# Patient Record
Sex: Male | Born: 1955 | Race: Black or African American | Hispanic: No | Marital: Married | Smoking: Never smoker
Health system: Southern US, Community
[De-identification: ages and names within clinical notes are randomized; demographics above are authoritative.]

## PROBLEM LIST (undated history)

## (undated) DIAGNOSIS — K219 Gastro-esophageal reflux disease without esophagitis: Secondary | ICD-10-CM

## (undated) DIAGNOSIS — E119 Type 2 diabetes mellitus without complications: Secondary | ICD-10-CM

## (undated) DIAGNOSIS — J181 Lobar pneumonia, unspecified organism: Secondary | ICD-10-CM

## (undated) DIAGNOSIS — K769 Liver disease, unspecified: Secondary | ICD-10-CM

## (undated) DIAGNOSIS — E785 Hyperlipidemia, unspecified: Secondary | ICD-10-CM

## (undated) DIAGNOSIS — I10 Essential (primary) hypertension: Secondary | ICD-10-CM

## (undated) DIAGNOSIS — Z89511 Acquired absence of right leg below knee: Secondary | ICD-10-CM

## (undated) HISTORY — DX: Lobar pneumonia, unspecified organism: J18.1

## (undated) HISTORY — DX: Gastro-esophageal reflux disease without esophagitis: K21.9

## (undated) HISTORY — DX: Acquired absence of right leg below knee: Z89.511

## (undated) HISTORY — DX: Essential (primary) hypertension: I10

## (undated) HISTORY — PX: WISDOM TOOTH EXTRACTION: SHX21

## (undated) HISTORY — DX: Hyperlipidemia, unspecified: E78.5

## (undated) HISTORY — PX: CHOLECYSTECTOMY: SHX55

## (undated) HISTORY — PX: BELOW KNEE LEG AMPUTATION: SUR23

## (undated) HISTORY — DX: Liver disease, unspecified: K76.9

---

## 2000-05-30 ENCOUNTER — Encounter: Payer: Self-pay | Admitting: *Deleted

## 2000-05-30 ENCOUNTER — Ambulatory Visit (HOSPITAL_COMMUNITY): Admission: RE | Admit: 2000-05-30 | Discharge: 2000-05-30 | Payer: Self-pay | Admitting: *Deleted

## 2000-06-10 ENCOUNTER — Encounter (HOSPITAL_BASED_OUTPATIENT_CLINIC_OR_DEPARTMENT_OTHER): Payer: Self-pay | Admitting: General Surgery

## 2000-06-14 ENCOUNTER — Encounter (HOSPITAL_BASED_OUTPATIENT_CLINIC_OR_DEPARTMENT_OTHER): Payer: Self-pay | Admitting: General Surgery

## 2000-06-15 ENCOUNTER — Inpatient Hospital Stay (HOSPITAL_COMMUNITY): Admission: RE | Admit: 2000-06-15 | Discharge: 2000-06-17 | Payer: Self-pay | Admitting: General Surgery

## 2000-06-23 ENCOUNTER — Ambulatory Visit (HOSPITAL_COMMUNITY): Admission: RE | Admit: 2000-06-23 | Discharge: 2000-06-23 | Payer: Self-pay | Admitting: General Surgery

## 2000-06-23 ENCOUNTER — Encounter (HOSPITAL_BASED_OUTPATIENT_CLINIC_OR_DEPARTMENT_OTHER): Payer: Self-pay | Admitting: General Surgery

## 2000-06-29 ENCOUNTER — Ambulatory Visit (HOSPITAL_COMMUNITY): Admission: RE | Admit: 2000-06-29 | Discharge: 2000-06-29 | Payer: Self-pay | Admitting: Gastroenterology

## 2000-06-29 ENCOUNTER — Encounter: Payer: Self-pay | Admitting: Gastroenterology

## 2000-06-30 ENCOUNTER — Inpatient Hospital Stay (HOSPITAL_COMMUNITY): Admission: EM | Admit: 2000-06-30 | Discharge: 2000-07-10 | Payer: Self-pay | Admitting: Emergency Medicine

## 2000-06-30 ENCOUNTER — Encounter: Payer: Self-pay | Admitting: Emergency Medicine

## 2000-07-01 ENCOUNTER — Encounter: Payer: Self-pay | Admitting: Internal Medicine

## 2000-07-06 ENCOUNTER — Encounter: Payer: Self-pay | Admitting: Internal Medicine

## 2001-07-07 ENCOUNTER — Ambulatory Visit (HOSPITAL_COMMUNITY): Admission: RE | Admit: 2001-07-07 | Discharge: 2001-07-07 | Payer: Self-pay | Admitting: Gastroenterology

## 2001-07-07 ENCOUNTER — Encounter: Payer: Self-pay | Admitting: Gastroenterology

## 2001-08-18 ENCOUNTER — Inpatient Hospital Stay (HOSPITAL_COMMUNITY): Admission: EM | Admit: 2001-08-18 | Discharge: 2001-08-19 | Payer: Self-pay | Admitting: Emergency Medicine

## 2001-08-18 ENCOUNTER — Encounter: Payer: Self-pay | Admitting: Gastroenterology

## 2001-10-09 ENCOUNTER — Encounter: Payer: Self-pay | Admitting: Gastroenterology

## 2001-10-09 ENCOUNTER — Ambulatory Visit (HOSPITAL_COMMUNITY): Admission: RE | Admit: 2001-10-09 | Discharge: 2001-10-09 | Payer: Self-pay | Admitting: Gastroenterology

## 2003-01-09 ENCOUNTER — Ambulatory Visit (HOSPITAL_COMMUNITY): Admission: RE | Admit: 2003-01-09 | Discharge: 2003-01-09 | Payer: Self-pay | Admitting: Gastroenterology

## 2003-01-09 ENCOUNTER — Encounter: Payer: Self-pay | Admitting: Gastroenterology

## 2003-06-11 ENCOUNTER — Inpatient Hospital Stay (HOSPITAL_COMMUNITY): Admission: EM | Admit: 2003-06-11 | Discharge: 2003-06-14 | Payer: Self-pay | Admitting: Emergency Medicine

## 2003-06-11 ENCOUNTER — Ambulatory Visit (HOSPITAL_COMMUNITY): Admission: RE | Admit: 2003-06-11 | Discharge: 2003-06-11 | Payer: Self-pay | Admitting: Gastroenterology

## 2003-08-09 ENCOUNTER — Ambulatory Visit (HOSPITAL_COMMUNITY): Admission: RE | Admit: 2003-08-09 | Discharge: 2003-08-09 | Payer: Self-pay | Admitting: *Deleted

## 2004-06-11 ENCOUNTER — Ambulatory Visit: Payer: Self-pay | Admitting: Gastroenterology

## 2004-09-29 ENCOUNTER — Inpatient Hospital Stay (HOSPITAL_COMMUNITY): Admission: EM | Admit: 2004-09-29 | Discharge: 2004-10-01 | Payer: Self-pay | Admitting: Gastroenterology

## 2004-09-29 ENCOUNTER — Ambulatory Visit: Payer: Self-pay | Admitting: Gastroenterology

## 2004-10-27 ENCOUNTER — Ambulatory Visit: Payer: Self-pay | Admitting: Gastroenterology

## 2005-04-20 ENCOUNTER — Ambulatory Visit: Payer: Self-pay | Admitting: Gastroenterology

## 2005-06-09 ENCOUNTER — Ambulatory Visit: Payer: Self-pay | Admitting: Gastroenterology

## 2005-06-10 ENCOUNTER — Ambulatory Visit: Payer: Self-pay | Admitting: Gastroenterology

## 2005-06-10 ENCOUNTER — Ambulatory Visit (HOSPITAL_COMMUNITY): Admission: RE | Admit: 2005-06-10 | Discharge: 2005-06-10 | Payer: Self-pay | Admitting: Gastroenterology

## 2005-07-02 ENCOUNTER — Ambulatory Visit: Payer: Self-pay | Admitting: Gastroenterology

## 2005-12-13 ENCOUNTER — Ambulatory Visit: Payer: Self-pay | Admitting: Gastroenterology

## 2005-12-13 ENCOUNTER — Encounter: Admission: RE | Admit: 2005-12-13 | Discharge: 2005-12-13 | Payer: Self-pay | Admitting: Cardiology

## 2005-12-14 ENCOUNTER — Ambulatory Visit (HOSPITAL_COMMUNITY): Admission: RE | Admit: 2005-12-14 | Discharge: 2005-12-14 | Payer: Self-pay | Admitting: Gastroenterology

## 2005-12-17 ENCOUNTER — Ambulatory Visit: Payer: Self-pay | Admitting: Gastroenterology

## 2005-12-23 ENCOUNTER — Ambulatory Visit: Payer: Self-pay | Admitting: Gastroenterology

## 2005-12-23 ENCOUNTER — Ambulatory Visit (HOSPITAL_COMMUNITY): Admission: RE | Admit: 2005-12-23 | Discharge: 2005-12-23 | Payer: Self-pay | Admitting: Gastroenterology

## 2006-07-20 ENCOUNTER — Ambulatory Visit: Payer: Self-pay | Admitting: Gastroenterology

## 2006-07-20 LAB — CONVERTED CEMR LAB
ALT: 23 units/L (ref 0–40)
AST: 22 units/L (ref 0–37)
Albumin: 3.8 g/dL (ref 3.5–5.2)
Alkaline Phosphatase: 65 units/L (ref 39–117)
Basophils Absolute: 0 10*3/uL (ref 0.0–0.1)
Basophils Relative: 0.4 % (ref 0.0–1.0)
Bilirubin, Direct: 0.2 mg/dL (ref 0.0–0.3)
Eosinophils Absolute: 0.2 10*3/uL (ref 0.0–0.6)
Eosinophils Relative: 2.7 % (ref 0.0–5.0)
HCT: 42.2 % (ref 39.0–52.0)
Hemoglobin: 14.6 g/dL (ref 13.0–17.0)
Lymphocytes Relative: 30.5 % (ref 12.0–46.0)
MCHC: 34.7 g/dL (ref 30.0–36.0)
MCV: 92.5 fL (ref 78.0–100.0)
Monocytes Absolute: 0.5 10*3/uL (ref 0.2–0.7)
Monocytes Relative: 7 % (ref 3.0–11.0)
Neutro Abs: 3.9 10*3/uL (ref 1.4–7.7)
Neutrophils Relative %: 59.4 % (ref 43.0–77.0)
Platelets: 230 10*3/uL (ref 150–400)
RBC: 4.56 M/uL (ref 4.22–5.81)
RDW: 12.5 % (ref 11.5–14.6)
Total Bilirubin: 1.2 mg/dL (ref 0.3–1.2)
Total Protein: 7.7 g/dL (ref 6.0–8.3)
WBC: 6.6 10*3/uL (ref 4.5–10.5)

## 2007-01-09 ENCOUNTER — Ambulatory Visit: Payer: Self-pay | Admitting: Gastroenterology

## 2007-01-20 ENCOUNTER — Ambulatory Visit: Payer: Self-pay | Admitting: Gastroenterology

## 2007-01-20 ENCOUNTER — Encounter: Payer: Self-pay | Admitting: Gastroenterology

## 2007-06-06 ENCOUNTER — Ambulatory Visit: Payer: Self-pay | Admitting: Gastroenterology

## 2007-06-06 LAB — CONVERTED CEMR LAB
ALT: 16 units/L (ref 0–53)
AST: 23 units/L (ref 0–37)
Albumin: 3.9 g/dL (ref 3.5–5.2)
Alkaline Phosphatase: 57 units/L (ref 39–117)
Bilirubin, Direct: 0.1 mg/dL (ref 0.0–0.3)
Total Bilirubin: 1 mg/dL (ref 0.3–1.2)
Total Protein: 7.4 g/dL (ref 6.0–8.3)

## 2010-10-09 NOTE — Discharge Summary (Signed)
NAME:  [name], [name]                         ACCOUNT NO.:  [id number]   MEDICAL RECORD NO.:  [id number]                   PATIENT TYPE:  INP   LOCATION:  0368                                 FACILITY:  [facility]   PHYSICIAN:  Barbette Hair. Arlyce Dice, M.D. [facility]          DATE OF BIRTH:  [birth year]   DATE OF ADMISSION:  06/11/2003  DATE OF DISCHARGE:  06/14/2003                                 DISCHARGE SUMMARY   ADMITTING DIAGNOSES:  79. A 55 year old male with acute abdominal pain and nausea, status post     ERCP, biliary sphincterotomy, stone extraction and stent exchange earlier     on same day, rule out pancreatitis post ERCP, rule out cholangitis, rule     out possible perforation.  2. 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  3. Status post multiple surgeries on the right lower extremity for     deformity.  4. Hypertension.   DISCHARGE DIAGNOSES:  1. Acute cholangitis, status post ERCP, resolved.  74. 4.  A 55 year old male with acute abdominal pain and nausea, status post     ERCP, biliary sphincterotomy, stone extraction and stent exchange earlier     on same day, rule out pancreatitis post ERCP, rule out cholangitis, rule     out possible perforation.  3. 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  4. Status post multiple surgeries on the right lower extremity for     deformity.  5. Hypertension.   CONSULTATIONS:  None.   PROCEDURES:  CT scan of the abdomen and pelvis.   BRIEF HISTORY:  [name] is a pleasant 55 year old African American male known  to Dr. Arlyce Dice and the GI service.  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  He had  undergone ERCP  with biliary sphincterotomy, stone extraction, and dilation of a biliary  stricture and then re-stenting of the bile duct per Dr. Arlyce Dice, earlier on  the day of admission.  He developed acute right upper quadrant pain and  nausea which then progressed late in the evening of the day of the  procedure.  He was called in Darvocet and Phenergan, advised to stay on  clear liquids but had progressive symptoms and then vomiting and presented  to the emergency room.  He has had two prior episodes of post ERCP  pancreatitis.  He was seen and evaluated by Dr. Leone Payor in the emergency  room who was covering on call and admitted to the hospital for pain control  and further diagnostic evaluation to rule out cholangitis and/or perforation  complication post procedure.  Admission amylase and lipase were normal,  therefore, not  consistent with a post ERCP pancreatitis.   CURRENT MEDICATIONS:  1. Diovan one p.o. every day.  2. Nexium 40 mg p.o. every day.   ALLERGIES:  No known drug allergies.   PAST MEDICAL HISTORY:  As outlined above.   SOCIAL HISTORY:  The patient is married.  He is a nondrinker, nonsmoker.  He  is employed as a Advice worker in the emergency room.  He has one son.   FAMILY HISTORY:  Negative for GI disease.   PHYSICAL EXAMINATION:  GENERAL: Per Dr. Leone Payor:  A well developed African  American male, quiet and in pain.  VITAL SIGNS: Temp 98.7, blood pressure 146/82, pulse is 96, respirations 18,  temp was 100.8 on recheck.  HEENT:  Nontraumatic, normocephalic.  EOMI.  PERRLA.  Sclera were anicteric,  on admission.  CV:  Regular, rate and rhythm with S1 and S2.  PULMONARY:  Clear to A&P.  ABDOMEN:  Soft, mildly tender in the right upper quadrant.  There is no  guarding or rebound.  No mass or hepatosplenomegaly.  EXTREMITIES:  Right lower extremity prosthesis.  NEUROLOGIC:  Sleepy post pain medications but nonfocal.   LABORATORY STUDIES:  On admission,  WBC of 12.2, hemoglobin 15.6,  hematocrit  43.4, MCV of 90, platelets 227.  A followup, on June 14, 2003, WBC of  11.11, hemoglobin 13, hematocrit of 38.8, MCV of 93.  Electrolytes, on  admission, within normal limits.  Glucose 116, BUN 7, creatinine 1.3,  albumin of 4.4.  Liver function studies, on admission, total bilirubin 2.9,  alk phos 152, ALT 112, AST 213, amylase of 108 and lipase of 17.  Followup,  on June 13, 2003, total bilirubin 4.4, alk phos 146, ALT 127, AST 108;  and, on June 14, 2003, total bilirubin 3.4, alk phos 135, ALT 92 and AST  of 63.  UA negative.  Blood culture negative x 2.   X-RAY STUDIES:  CT scan of the abdomen and pelvis, on June 12, 2003,  showed the biliary stent in place, mild intrahepatic ductal dilation, and  gastris distention of the proximal and transverse colon, otherwise negative.   Chest x-ray:  Minor right basilar atelectasis.   HOSPITAL COURSE:  The patient was admitted to the service of Dr. Stan Head who was covering the hospital.  He was initially kept NPO, started  on IV fluids, given Dilaudid and Phenergan as needed for pain and nausea.  Placed on IV Protonix and continued on Diovan.  He was scheduled for a CT  scan of the abdomen and pelvis with findings as outlined above.  The  following morning, he was still feeling quite ill, complaining of a deep  pain in his right upper quadrant and was actively having rigors though no  further nausea or vomiting.  It was felt that he was bacteremic.  His white  count had also gone up to 16.7, and he was treated for cholangitis.  He had  been placed on IV Unison and this was continued.  We started a morphine PCA,  obtained blood cultures, monitored his O2 sats, etcetera.  He was followed  expectantly with plans to repeat ERCP should he deteriorate; however, the  following day, he was feeling and looking much better.  His pain was decreased.  T-max was 100.2.  White count was up to 18.6  and bili up to 4.4.  However, clinically he was much improved.  We decided to continue  observation.  By June 14, 2003, again  he was feeling much better.  He had  tolerated some p.o., had remained afebrile.  His liver function studies had  started to normalize, and his white blood cell count had dropped from 18.6-  11.1.  It was felt that he was stable for discharge to home and was allowed  discharge with instructions to remain on Cipro 500 mg b.i.d. x 7 days and to  followup in the office with Dr. Arlyce Dice in one month or sooner p.r.n.   DIET ON DISCHARGE:  Regular.   OTHER MEDICATIONS:  1. Diovan as previous.  2. Nexium 40 mg every day.   CONDITION ON DISCHARGE:  Stable and improved.     Mike Gip, P.A.-C. [facility]                Robert D. Arlyce Dice, M.D. [facility]    AE/MEDQ  D:  06/25/2003  T:  06/25/2003  Job:  [id number]

## 2010-10-09 NOTE — H&P (Signed)
NAMEQUINTEZ, MASELLI               ACCOUNT NO.:  [id number]   MEDICAL RECORD NO.:  [id number]          PATIENT TYPE:  INP   LOCATION:  0470                         FACILITY:  [facility]   PHYSICIAN:  Barbette Hair. Arlyce Dice, M.D. [facility] OF BIRTH:  1956-02-23   DATE OF ADMISSION:  09/29/2004  DATE OF DISCHARGE:                                HISTORY & PHYSICAL   CHIEF COMPLAINT:  Nausea, vomiting, fever, jaundice, and abdominal pain.   HISTORY:  [name] is a very nice 55 year old African-American male, known to  Dr. Arlyce Dice.  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  He has a history of hypertension and  choledocholithiasis with development of a subsequent biliary stricture.  He  is status post laparoscopic cholecystectomy in 2001 and since that time, has  required several ERCPs and stenting secondary to the stricture.  He has also  had a couple of episodes of pancreatitis.  His last ERCP was done on June 11, 2003.  At that time, he was found to have multiple common hepatic duct  left and right intrahepatic duct stones and mid common bile duct stricture.  He had a balloon pull-through done and placement of a 10 French 9 cm stent.  Prior to that, he had ERCP in August 2004.  Again, at that time, had common  hepatic duct stricture with 9 cm 30 mm biliary stent.  He relates that he  has been doing well since his last ERCP and has not had any problems with  abdominal pain.  Today, he had onset of fever and rigors with temp to 101 at  home.  Along with this, a deep, achy epigastric discomfort and nausea and  vomiting.  He says the pain is not severe, but he overall feels poorly and  has been unable to keep anything down.  He also has noted that his urine has  been dark today.  He called and because he was concerned about recurrent  infection, is admitted at this time with probable cholangitis for IV  antibiotics, antiemetics, blood cultures, and plans  for ERCP and/or stent  exchange on May 10.   CURRENT MEDICATIONS:  1.  Protonix 40 mg daily.  2.  Diovan 1 p.o. daily.  3.  Urso 1 p.o. b.i.d.   ALLERGIES:  No known drug allergies.   PAST MEDICAL HISTORY:  As outlined above.   FAMILY HISTORY:  Negative for GI disease.   SOCIAL HISTORY:  The patient is married.  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  No ETOH and  no tobacco.   REVIEW OF SYSTEMS:  CARDIOVASCULAR:  Denies any chest pain or anginal  symptoms.  PULMONARY:  Negative for cough, shortness of breath, or sputum  production.  GENITOURINARY:  Dark urine today.  GI:  As above.  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   PHYSICAL EXAMINATION:  GENERAL:  A well-developed African-American  male, ill-  appearing, alert and oriented x 3, is somewhat diaphoretic.  VITAL SIGNS:  Temp is 100.8.  Blood pressure is 110/60.  Pulse is 112.  HEENT:  Nontraumatic, normocephalic.  EOMI.  He does have evidence of  scleral icterus.  CARDIOVASCULAR:  Regular rate and rhythm with S1 and S2.  He is somewhat  tachy.  PULMONARY:  Clear to A&P.  ABDOMEN:  Soft, minimally tender in the epigastrium.  There is no guarding  or rebound.  No palpable mass or hepatosplenomegaly.  No distention.  RECTAL EXAM:  Not done today.  EXTREMITIES:  He does have a right lower extremity brace/prosthesis.   LABORATORY DATA:  Pending.   IMPRESSION:  1.  A 55 year old male with probable cholangitis due to known bile duct      stricture, rule out stent occlusion, migration, or recurrent      choledocholithiasis.  2.  Status post cholecystectomy, 2001.  3.  History of recurrent pancreatitis, recurrent choledocholithiasis, and      common bile duct stricture.  4.  Hypertension.   PLAN:  The patient is admitted to the service of Dr. Melvia Heaps for IV  fluid  hydration, and we will cover him with IV Cipro and IV Flagyl.  Obtain  baseline labs, blood cultures, and plan ERCP on Wednesday, May 10, with  stenting as indicated.       AE/MEDQ  D:  09/29/2004  T:  09/29/2004  Job:  [id number]

## 2010-10-09 NOTE — Assessment & Plan Note (Signed)
[facility]                           GASTROENTEROLOGY OFFICE NOTE   OMEED, OSUNA                      MRN:          [id number]  DATE:12/13/2005                            DOB:          Apr 28, 1956    PROBLEM:  Abdominal pain and pruritus.   HISTORY:  Mr. Cross has returned for a working visit.  Yesterday, he  developed dark-colored urine, pruritus, and abdominal pain.  He has a known  biliary stricture with recurrent bile duct stones.  A biliary stent is in  place.  He was last examined in January 2007.  He is without fever or  chills.   EXAMINATION:  VITAL SIGNS:  Pulse 80, blood pressure 116/64, weight 219.   LABORATORY DATA:  Today included white count 6.6, bili 5.5, alk phos 166,  AST 56.   IMPRESSION:  Clogged biliary stent.   RECOMMENDATIONS:  ERCP.  At his last ERCP, two stents were placed after  dilating his biliary stricture.  It was intended to remove the stents after  three months, but Mr. Urwin failed to show up for this.  I plan on removing  the stents and, if the stricture is patent, not replacing the stents at this  time.                                   Barbette Hair. Arlyce Dice, MD, [place]   RDK/MedQ  DD:  12/13/2005  DT:  12/13/2005  Job #:  [id number]   cc:   Leonie Man, MD

## 2010-10-09 NOTE — H&P (Signed)
[facility]  Patient:    [name], [name]                      MRN: [id number] Adm. Date:  [id number] Disc. Date: [id number] Attending:  Judeth Cornfield Dictator:   Mike Gip, P.A. CC:         Dortha Kern, Montez Hageman., M.D.  Mardene Celeste Lurene Shadow, M.D.   History and Physical  PROBLEM:  Severe abdominal pain with nausea and vomiting.  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 The patient presented with symptomatic cholelithiasis/cholecystitis, and underwent laparoscopic cholecystectomy which was converted to an open cholecystectomy on June 14, 2000, secondary to bowel duct injury.  The patient had a T-tube which eventually migrated and was removed.  Since that time, he has been recovering from surgery with no pain, nausea, or vomiting, but had developed some itching and elevated liver function studies.  He was then referred to Dr. Arlyce Dice for endoscopic retrograde cholangiopancreatography which he underwent yesterday on June 29, 2000.  Endoscopic retrograde cholangiopancreatography revealed a 3 cm mid common bile duct stricture which was balloon dilated and then stented.  The patient tolerated the procedure without difficulty and was discharged to home.  He developed abdominal pain within two hours of going home, which became constant and progressive, and was associated with nausea and vomiting all night.  Pain is located in the epigastrium and right upper quadrant, radiating into his back.  He has had no associated fevers, chills, diaphoresis, no diarrhea, melena, or hematochezia. The patient called early this a.m. and was advised to come to the ER.  In the ER, he is alert and oriented, hemodynamically stable, but in pain.  KUB showed no evidence of free air or ileus.   Stent is present in the right upper quadrant.  LABORATORY DATA:  Amylase of 1585, lipase is pending.  WBC of 12.4, hemoglobin 16.1, hematocrit 47, MCV 91.  Sodium 138, potassium 4.4, BUN 15, creatinine 1.2.  Total bilirubin 1.8, alkaline phosphatase 237, SGOT 222, SGPT 535.  The patient is admitted at this time with post endoscopic retrograde cholangiopancreatography pancreatitis for supportive management.  CURRENT MEDICATIONS: 1. Diovan 160/12.5 q.d. 2. Xenical t.i.d.  ALLERGIES:  No known drug allergies.  PAST MEDICAL HISTORY: 1. Hypertension. 2. Obesity, on Xenical. 3. Birth defect, status post right below-the-knee amputation.  FAMILY HISTORY:  See recent dictated H&P.  SOCIAL HISTORY:  The patient is married, has one son.  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  No tobacco and no ETOH.  REVIEW OF SYSTEMS:  CARDIOVASCULAR:  Denies any chest pain or anginal symptoms.  PULMONARY:  Negative for cough, shortness of breath, or sputum production.  GENITOURINARY:  Negative for discharge, urgency, or frequency.  GASTROINTESTINAL:  As above.  PHYSICAL EXAMINATION:  GENERAL:  Well-developed black male who is uncomfortable, in pain, but alert and oriented x 3.  VITAL SIGNS:  Blood pressure 145/89, pulse 87, respirations 20, temperature 98.  HEENT:  Normocephalic, atraumatic.  Extraocular movements intact.  Pupils are equal, round and reactive to light and accommodation.  Sclerae anicteric.  NECK:  Supple without nodes.  CARDIOVASCULAR:  Regular rate and rhythm with S1 and S2, slightly tachy.  PULMONARY:  Clear to auscultation and percussion.  ABDOMEN:  Soft, bowel sounds are present, but hypoactive.  He is tender diffusely across the upper abdomen with guarding in the epigastrium.  No rebound.  Has healing incisional scar.  No mass or hepatosplenomegaly.  RECTAL:  Not done.  EXTREMITIES:  Without clubbing,  cyanosis, or edema.  He is status post right below-the-knee amputation with prosthesis.  IMPRESSION: 1. A 55 year old male with post endoscopic retrograde cholangiopancreatography    pancreatitis. 2. Mid common bile duct stricture, status post endoscopic retrograde    cholangiopancreatography with balloon dilation of stricture and stenting on    June 29, 2000. 3. Status post open cholecystectomy with common bile duct injury on June 14, 2000. 4. Hypertension. 5. 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  PLAN:  The patient is admitted to the service of Dr. Melvia Heaps for IV fluid hydration.  He will be kept n.p.o.  Pain management with Demerol and Phenergan as needed.  Serial labs.  For details, please see the orders.0 DD:  06/30/00 TD:  07/01/00 Job: [id number]

## 2010-10-09 NOTE — H&P (Signed)
[facility]  Patient:    [name], [name] Visit Number: [id number] MRN: [id number]          Service Type: MED Location: [phone] Attending Physician:  Mervin Hack Dictated by:   Dianah Field, P.A. Admit Date:  08/17/2001   CC:         Luisa Hart L. Lurene Shadow, M.D.  Sharyn Dross., M.D.   History and Physical  GENERAL SURGEON:  Luisa Hart L. Lurene Shadow, M.D.  PRIMARY CARE PHYSICIAN:  Dortha Kern, Montez Hageman., M.D.  CHIEF COMPLAINT:  Acute onset of upper abdominal pain with nausea and vomiting.  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  He has a history of essential hypertension. He also has a history of surgery on the gallbladder in June 14, 2000.  This initially was a laparoscopic cholecystectomy, but was converted to open cholecystectomy because of bile duct injury.  This required placement of a T tube.  Ultimately postoperatively he developed stricturing of the common bile duct revealed at ERCP on June 29, 2000.  At that time, the stricture was balloon dilated and a stent was placed.  Following that procedure, he developed post ERCP pancreatitis and was hospitalized at [facility] from June 30, 2000, until July 10, 2000.  In the following year, the patient did well and returned for periodic evaluation to Barbette Hair. Arlyce Dice, M.D.  Recently on July 07, 2001, Dr. Arlyce Dice performed repeat ERCP and removed the stent to the common bile duct.  At the time the cholangiogram showed no evidence for extravasation of contrast.  There was some slight narrowing noted where the stricture had previously been located, but it did not appear significant.  There was also some slight irregularity to the common hepatic duct.  Prior to this ERCP, the patients LFTs had normalized.  Since that ERCP on July 07, 2001, the patient has been doing quite well with no abdominal complaints other than some occasional dyspepsia for which he uses Nexium p.r.n.  These symptoms predate his gallbladder troubles.  About two to three days ago, the patient retrospectively notes that he saw some deepening to the color of his urine which he attributed to lack of fluid intake.  He increased the volume of water he was drinking, but the urine remained still a more intense color.  On the evening of August 17, 2001, the patient developed some mild epigastric distress and felt a little bit queasy. He thought maybe he had some gas and took some Tums.  However, over the course of the next three hours, he developed worsening abdominal pain to the point where he ended up doubled over with pain and experiencing chills and nausea with mostly dry heaves.  He called Hedwig Morton. Juanda Chance, M.D., on call for [facility], and was advised to come to the emergency room.  Once there an acute abdominal series showed no significant change in the bowel gas pattern or any acute findings.  At the time she saw the patient, all of his labs were pending.  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  She admitted him and started him empirically on Cipro though he did not have any fevers.  This morning, labs reveal that indeed his LFTs, including his transaminases, total bilirubin, and alkaline phosphatase are significantly elevated.  PAST MEDICAL HISTORY: 1. Hypertension. 2. 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 3. Obesity. 4. Status post open cholecystectomy with surgical complications of common bile    duct injury requiring T tube placement. 5. Status post ERCP with stent placement to the common bile duct in February    of 2002 for management of postoperative common bile duct stricture. 6. Post ERCP pancreatitis.  Admitted to [facility] for management in    February of 2002. 7. Status post ERCP with removal of common bile duct stent on July 07, 2001.  ALLERGIES:  He is allergic to PENICILLIN, which has caused rash in the past.  CURRENT MEDICATIONS: 1. Diovan 10 mg p.o. q.d. 2. Nexium 40 mg p.o. q.d. p.r.n.  SOCIAL HISTORY:  The patient is married with one son who is 28 years old.  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  He does not smoke or consume alcoholic beverages.  FAMILY HISTORY:  His father suffers from hypertension.  His mother suffers from chronic anemia which has been worked up and the etiology is unclear.  The patient has one brother who is healthy.  There is no family history of gallbladder disease or cancers or strokes.  No family history of diabetes.  REVIEW OF SYSTEMS:  Neurologic:  No focal weakness.  No headaches.  No visual problems.  Pulmonary:  No cough.  No shortness of breath.  GI:  As above.  Up until this acute abdominal pain, the patient had had a good appetite and no change in bowel habits.  GU:  Urine has become darker over the last few days. He denies dysuria or urinary frequency.  Denies excessive thirst. Dermatologic:  No pruritus.  No rashes.  General:  No weight loss.  No anorexia.  Chills present just with recent acute abdominal pain, but otherwise not present.  No night sweats.  PHYSICAL EXAMINATION:  VITAL SIGNS:  Blood pressure 134/68, pulse 89, respirations 16, temperature 98.3 degrees.  WEIGHT:  245 pounds.  GENERAL APPEARANCE:  The patient is a pleasant, uncomfortable-appearing, African-American man.  He is obese.  He does not appear toxic.  HEENT:  The sclerae are not icteric.  The conjunctivae are pink.  Extraocular movements intact.  Oropharynx:  The mucous membranes are moist and clear. Dentition is in good repair.  NECK:  There is no JVD, no masses, and no thyromegaly.   CHEST:  Clear to  auscultation and percussion bilaterally.  No cough.  Lung excursion is within normal limits.  CORONARY:  There is a regular rate and rhythm with normal S1 and S2.  No murmurs, rubs, or gallops.  ABDOMEN:  Soft, but obese and protuberant.  There is a cholecystectomy scar in the right upper quadrant.  Tenderness is noted in the left upper quadrant radiating to the epigastrium.  Bowel sounds are present, but decreased.  There is no increase to percussive tympany.  No masses or hepatosplenomegaly.  RECTAL:  Deferred.  GENITOURINARY:  Deferred.  EXTREMITIES:  There is the right below-knee amputation evident.  The left leg displays no pedal or ankle edema.  NEUROLOGIC:  No tremor.  No confusion, but he is a bit drowsy owing to recent analgesic medication dosing.  DERMATOLOGIC:  No obvious jaundice present, though difficult to say with his overall skin coloration being African-American.  MUSCULOSKELETAL:  No swelling of the knees or distortion of the joints in the hands.  He does have the below-knee amputation as noted above.  LABORATORY DATA:  White blood cell count elevated at 12.6, hemoglobin 15.1, hematocrit 43.3, MCV 91.6, platelets 208.  The PT is 12.6, INR 0.9, and PTT 26.  Sodium 136, potassium 3.5, chloride 100, CO2 27.  Glucose elevated at 180.  The BUN is 10 and creatinine 1.2.  Total bilirubin 3.9, alkaline phosphatase 172, AST 290, ALT 508.  Albumin normal at 3.9.  The calcium is 9.0.  The amylase is 82 and the lipase is 13.  Urine notable for glycosuria at 250 mg/dl with glucose at 147 mg/dl.  Urobilinogen is present at 2.0 mg. There is a small amount of leukocytes, but only 3-6 white blood cells per high-power field.  No urine nitrites present.  IMPRESSION: 1. Jaundice and abnormal liver function tests likely secondary to recurrent    common bile duct stricture. 2. Postoperative common bile duct stricture requiring endoscopic retrograde    cholangiopancreatography and  balloon dilatation with placement of stent.    Stent in place for one year from February of 2002 to February of 2003.    Stent removed on July 07, 2001. 3. History of post endoscopic retrograde cholangiopancreatography pancreatitis    in February of 2002. 4. Hyperglycemia.  Rule out glucose intolerance versus adult onset diabetes    mellitus. 5. History of hypertension, currently controlled. 6. 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  PLAN: 1. The patient is scheduled for ERCP with stent placement this afternoon with    Barbette Hair. Arlyce Dice, M.D.  He is to be continued on Cipro which was started in    the emergency room.  The patient will probably require surgical evaluation    and management of the common bile duct stricture as at present he has    failed stenting therapy and has developed recurrent common bile duct    stricturing following removal of the stent to common bile duct. 2. Plan to check hemoglobin A1C and follow the patients blood sugars with    serum levels daily.  At this point, it does not seem like he will require    any sliding scale insulin. Dictated by:   Dianah Field, P.A. Attending Physician:  Mervin Hack DD:  08/18/01 TD:  08/18/01 Job: [id number]

## 2010-10-09 NOTE — H&P (Signed)
[facility]  Patient:    [name], [name]                      MRN: [id number] Adm. Date:  [id number] Attending:  Sonda Primes CC:         Mardene Celeste. Lurene Shadow, M.D. x 2                         History and Physical  PROBLEM: 1. Chronic calculus cholecystitis and cholelithiasis, status post laparoscopic    cholecystectomy. 2. Common bile duct injury, status post stenting and reanastomosis. 3. Hypertension.  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  He had not had any fevers, chills, or jaundice.  He had ultrasound done in [facility] which showed a cholelithiasis and also showed a rather small common bile duct of 3 mm.  He has no liver function abnormalities.  He was admitted to the hospital today and underwent laparoscopic cholecystectomy.  During the course of his cholecystectomy, the anterior wall of his very small common bile duct was opened through which a cholangiogram was done showing that this was indeed the common bile duct.  He subsequently underwent laparotomy for repair of his common bile duct.  He is being admitted to the hospital postoperatively for continued postoperative care.  PAST MEDICAL HISTORY:  The patient has a history of hypertension for which he takes Diovan.  He is in general good health.  He does not smoke or drink alcohol and has no known medication allergies.  REVIEW OF SYSTEMS:  Negative in detail except that he is status post a right-sided below knee amputation as a result of a birth defect.  He has adapted excellently on a prosthesis.  SOCIAL HISTORY:  The patient is married.  He has a single child, Reuel Boom.  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  PHYSICAL EXAMINATION:  GENERAL:  This is a well-developed, well-nourished black  male.  HEENT:  No carotid bruits.  No thyromegaly or adenopathy.  LUNGS:  Clear to auscultation.  HEART:  Regular rate and rhythm without murmurs.  ABDOMEN:  Without tenderness or masses.  There is no visceromegaly.  EXTREMITIES:  He is wearing a right below knee amputation prosthesis.  His other extremities are within normal limits.  ASSESSMENT:  Status post laparoscopic cholecystectomy and intraoperative cholangiogram, status post repair of common bile duct injury with stenting. Hypertension.  PLAN:  Routine postoperative care in this patient. DD:  06/14/00 TD:  06/14/00 Job: [id number]

## 2010-10-09 NOTE — Discharge Summary (Signed)
[facility]  Patient:    [name], [name]                      MRN: [id number] Adm. Date:  [id number] Disc. Date: [id number] Attending:  Mervin Hack Dictator:   Mike Gip, P.A.-C. CC:         Mardene Celeste. Lurene Shadow, M.D.   Discharge Summary  ADMISSION DIAGNOSES: 1. Acute post endoscopic retrograde cholangiopancreatography pancreatitis. 2. Mid common bile duct stricture, status post endoscopic retrograde    cholangiopancreatography with balloon dilation of stricture and stenting on    June 29, 2000. 3. Status post open cholecystectomy with common bile duct injury on    June 14, 2000, resulting in common bile duct stricture. 4. Hypertension. 5. Status post below-knee amputation secondary to birth defect on the right.  DISCHARGE DIAGNOSES: 1. Resolving acute severe post endoscopic retrograde cholangiopancreatography    pancreatitis complicated by left pleural effusion, extensive inflammatory    changes, hyperglycemia, persistent leukocytosis and persistent hiccups. 2. Mid common bile duct stricture status post biliary stent, June 29, 2000. 3. Gastroesophageal reflux disease.  CONSULTATIONS:  Dr. Lurene Shadow, surgery.  PROCEDURES: 1. CT scan of the abdomen and pelvis x 2. 2. Plain abdominal films.  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  The patient presented with symptomatic cholelithiasis/cholecystitis and underwent laparoscopic cholecystectomy which was converted to an open cholecystectomy on June 14, 2000, secondary to a bile duct injury.  The patient had a T tube which eventually migrated and was removed.  Since that time he has been recovering from surgery with no pain, nausea, or vomiting, but had developed some pruritus and elevated liver function studies.  He was then referred to Dr. Arlyce Dice for consideration of ERCP  and underwent the ERCP on June 29, 2000.  This revealed a 3-cm mid common bile duct stricture which was balloon dilated and then stented.  The patient tolerated the procedure without difficulty and was discharged to home.  Later that evening, he developed abdominal pain which became constant and progressive, was associated with nausea and vomiting all night.  He describes his pain as being located in the epigastrium and right upper quadrant radiating into his back.  He has had no associated fever, chills, diaphoresis, diarrhea, melena, or hematochezia.  He called early on the morning of admission and was advised to come to the emergency room and is admitted at time with acute post-ERCP pancreatitis with an amylase of 1585, WBC of 12.4, total bilirubin of 1.8, alk phos of 237, SGOT of 22, and SGPT of 535.  LABORATORY AND X-RAY DATA:  Again, on admission on February 7, WBC was 12.4, hemoglobin 16, hematocrit of 47, MCV of 91.  Electrolytes within normal limits; however, glucose was 170, BUN of 15, creatinine 1.2, albumin 4.4, total bilirubin of 1.8, alk phos of 237, SGOT of 40, SGPT of 535, lipase was 2164, and amylase 1585.  The patient had serial labs drawn.  On February 8, his white count had gone up to 21.3, hemoglobin 15.1, hematocrit of 45.4.  On February 12, WBC was 16.1, hemoglobin 11.7.  On February 14, WBC of 16.6.  On February 17, WBC down to 11.1, hemoglobin 12.6, hematocrit of 37.2.  Serial electrolytes also done and remained stable.  He continued with hyperglycemia in the 170-190 range, and this gradually resolved.  By February 17, his glucose was 122.  On February 17, albumin was 3.  LFTs on February 2, bilirubin was up to 6.2, alk phos of 202, SGOT of 81, SGPT of 241.  On February 10, bilirubin still 6.2.  On February 11, bili down to 4.7, and on February 17, total bilirubin down to 1.2, alk phos 137, SGOT of 56, SGPT of 71, amylase 142, and lipase of 144.  Blood cultures  x 2 on February 11 were negative.  X-ray studies:  Abdominal films on February 7:  Biliary stent in place, no acute abnormality.  CT scan of the abdomen and pelvis on February 8 showed changes of acute pancreatitis with extrapancreatic effusions and fluid collection just inferior to the pancreatic body which potentially may develop a pseudocyst.  There was a biliary stent in good position and no ductal dilation.  Follow-up CT scan on February 13 showed changes consistent with acute pancreatitis, no evidence for hemorrhagic pancreatitis, pseudocyst, or abscess.  The amount of pancreatic exudate essentially stable, no significant free pelvic fluid or adenopathy.  He did have a left-sided pleural effusion.  HOSPITAL COURSE:  The patient was admitted to the service of Dr. Melvia Heaps.  He was initially kept n.p.o., placed on IV fluids at 125 q.h., given Demerol and Phenergan for control of pain and nausea.  He was covered with IV Cipro 400 q.12h.  He underwent CT scan with findings as outlined above.  The patient remained ill with leukocytosis and rising bilirubin over the next few days.  Also, had significant pain.  He was converted to a morphine PCA which did seem to help.  He also developed difficulty with hiccups.  We tried various measures:  Thorazine, baclofen, and IV Phenergan with some benefit. By February 11, he was finally looking and feeling better, continued to have some fever in the 100-101 range, his white count had come down to 16,000, and bilirubin had peaked at 6.2 and was starting to drop.  He was kept n.p.o. and on February 12 was started on clear liquids.  We also placed him Protonix at that point and baclofen for persistent hiccups.  He was febrile at that point in the 101 range.  We did repeat a CT scan of the abdomen and pelvis on February 13 to rule out infected pseudocyst or phlegmon.  There was no evidence of necrotic tissue, abscess, or infection on CT scan.  By  February 14, he had no further significant pain, was not using any pain medication, was  tolerating full liquids; however, remained febrile.  His bilirubin continued to improve and was down to 1.5, so there was no evidence of stent obstruction.  On February 15, with continued improvement and only low-grade fevers in the 100 range, we discontinued his antibiotics and advanced him to a low-fat diet. On February 16, he continued to do well and had temps in the 99 range.  By February 17, he was doing well, had tolerated solid food for two days, was not requiring any pain medication, his hiccups were improving, and he was felt stable for discharge to home.  DISCHARGE FOLLOWUP:  He was to follow up with Dr. Arlyce Dice in the office in 10 days, to stay out of work until his return office appointment.  He was to call for any problems in the interim with increased abdominal pain, fever, chills, jaundice, etc.  DISCHARGE INSTRUCTIONS:  He was to maintain a low-fat diet.  DISCHARGE MEDICATIONS: 1. Protonix 40 mg p.o. q.d. 2. Maalox p.r.n. 3. Phenergan 25  mg p.o. q.6h. p.r.n. 4. Diovan one p.o. q.d. as previous. 5. Darvocet-N 100 one q.6h. p.r.n. pain.  CONDITION ON DISCHARGE:  Stable and improved. DD:  07/13/00 TD:  07/14/00 Job: [id number]

## 2010-10-09 NOTE — Discharge Summary (Signed)
[name], [name]               ACCOUNT NO.:  [id number]   MEDICAL RECORD NO.:  [id number]          PATIENT TYPE:  INP   LOCATION:  0470                         FACILITY:  [facility]   PHYSICIAN:  Barbette Hair. Arlyce Dice, M.D. [facility] OF BIRTH:  1955-06-06   DATE OF ADMISSION:  09/29/2004  DATE OF DISCHARGE:  10/01/2004                                 DISCHARGE SUMMARY   ADMITTING DIAGNOSES:  58.  A 55 year old male with probable acute cholangitis due to known bile      duct stricture, rule out stent occlusion or migration, rule out      recurrent choledocholithiasis.  2.  Status post cholecystectomy 2001.  3.  History of recurrent pancreatitis, recurrent choledocholithiasis and      common bile duct stricture.  4.  Hypertension.   DISCHARGE DIAGNOSES:  1.  Resolving acute cholangitis secondary to recurrent choledocholithiasis      with multiple common bile duct stones, common bile duct stricture.  2.  Status post cholecystectomy 2001.  3.  History of recurrent pancreatitis, recurrent choledocholithiasis and      common bile duct stricture.  4.  Hypertension.   CONSULTATIONS:  None.   PROCEDURES:  ERCP with stone extraction and stent replacement.   BRIEF HISTORY:  [name] is a very nice 55 year old male known to Dr.  Arlyce Dice. 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 He has history as described above of. He had undergone  cholecystectomy in 2001 and since that time has required several ERCP's and  stenting secondary to common bile duct stricture. His last  ERCP was done in  January 2005 and at that time he was found to have multiple common hepatic  duct and right intrahepatic duct stones as well as a mid common bile duct  stricture. He had a balloon pull-through done and placement of a 10-French 9-  cm stent. Prior to that, ERCP had been done in 2004.   The patient relates that he had been doing well since his last ERCP and had  not had  any problems with abdominal pain. Earlier on the day of admission,  he had onset of fever and rigors at home with temperature to 101 along with  this a deep achy epigastric discomfort and intractable nausea and vomiting.  He states that the pain is not severe but overall he feels poorly, had been  unable to keep anything down, and also noted that his urine was quite dark.  He called and because of concern for recurrent infection, he is admitted at  this time with probable ascending cholangitis for IV antibiotics,  antiemetics, blood cultures and then plan for ERCP.   LABORATORY STUDIES:  On May 9, WBC of 15.9, hemoglobin 15.7, hematocrit of  44.9, MCV of 92, platelets 209. Follow-up on May 11,WBC of 6.6, hemoglobin  13, hematocrit of 38.3. Pro time 11.9, INR 0.9, potassium 3.4, BUN 10,  creatinine 1.4, albumin 3.4. Liver function studies on admission showed a  total bilirubin 4.6, alk phos 170, ALT 131, AST of 93, amylase of  89 and  lipase of 20. Follow-up on May 11,  total bilirubin 1.1, alk phos 119, ALT  of 79 and AST of 45. Urinalysis showed large bilirubin, small leukocyte  esterase. Blood cultures were done and at the time of discharge were  negative x2   HOSPITAL COURSE:  The patient was admitted to the service of Dr. Melvia Heaps, blood cultures were done and he was started on IV Cipro and IV  Flagyl and felt to have an acute cholangitis. She was ill-appearing and  febrile to 101. He was hydrated, kept at bowel rest and of by the following  morning was feeling much better, with no significant abdominal discomfort,  had had that diaphoresis during the night, temperature up to 100.6. He was  scheduled for ERCP that afternoon and found to have multiple recurrent  common hepatic duct stones and common bile duct stones. These were removed  with a balloon catheter. He was noted to have a stricture in his mid common  bile duct and had stent replacement of a 10-French 9 cm stent across  the  stricture after multiple stones extracted. Tolerated the procedure well was  observed overnight and a by May 11 was looking and feeling much better, had  not spiked any further temperature. Blood cultures were negative. White  count was down to 6.6 and bilirubin down to 1.1. He was felt stable for  discharge to home with instructions to rest at home over the next few days.  He was to have follow-up liver function studies in one week and he will  follow up with Dr. Arlyce Dice in the office on June 1 at 11:30 a.m. He was to  call for any problems with recurrent fever or jaundice.   MEDICATIONS ON DISCHARGE:  1.  Cipro 500 b.i.d. x7 days.  2.  Flagyl 500 b.i.d. x7 days.  3.  Zofran 4 mg p.o. q.6 h p.r.n. nausea, vomiting.  4.  Vicodin 5/500 q.4-6 h p.r.n. pain.  5.  He was to resume his usual medications.   DIET:  As tolerated.   CONDITION ON DISCHARGE:  Stable and improved.       AE/MEDQ  D:  10/09/2004  T:  10/09/2004  Job:  [id number]

## 2010-10-09 NOTE — Op Note (Signed)
[place]  Patient:    [name], [name]                      MRN: [id number] Proc. Date: 06/14/00 Adm. Date:  [id number] Attending:  Sonda Primes                           Operative Report  PREOPERATIVE DIAGNOSIS:  Chronic calculus cholecystitis.  POSTOPERATIVE DIAGNOSIS:  Chronic calculus cholecystitis.  OPERATION PERFORMED: 1. Laparoscopic cholecystectomy with intraoperative cholangiogram. 2. Exploratory laparotomy for repair of common bile duct injury.  SURGEON:  Mardene Celeste. Lurene Shadow, M.D.  ASSISTANT:  Marnee Spring. Wiliam Ke, M.D.  ANESTHESIA:  General.  INDICATIONS FOR PROCEDURE:  The patient is a 55 year old man presenting with classic symptoms of upper abdominal pain associated with nausea.  He was seen and underwent gallbladder ultrasonography which showed a single large stone with associated sludge.   The patient had normal liver function studies.  No hyperlipasemia.  He is brought to the operating room now for a laparoscopic cholecystectomy.  During the course of the operation, the common bile duct was opened on its anterior wall through which a cholangiogram was taken.  The cholangiogram showed that the common bile duct was in fact opened.  It showed no entry up into the upper hepatic radicals.  This common bile duct injury being recognized, further dissection was carried up.  This showed a very foreshortened gallbladder with the cystic duct coming up from behind the common bile duct.  This was identified and again doubly clipped and transected.  The cystic artery was then doubly clipped and transected and the gallbladder dissected free from the liver bed.  The gallbladder was very deeply intrahepatic and somewhat small and bulbous.  At the conclusion of the cholecystectomy the gallbladder was placed on top of the liver.  We then made a right upper quadrant incision, deepened through the skin and subcutaneous tissue carrying the  dissection across the rectus muscle and opened the posterior sheath of the peritoneum.  Upon entering the abdomen, the cystic duct catheter was traced down to its entry into the common bile duct.  The common bile duct was then repaired with a running suture of 6-0 Novofil.  This was done over a stent which was passed through the cystic duct down into the distal common bile duct.  At the end of this, another cholangiogram was taken through the stent which showed free flow of contrast down into the duodenum and good filling of the upper hepatic radicals.  The stent was left in place with a single hemoclip.  There was no extravasation.  Sponge, instrument and sharp counts were verified.  A single 10 mm flat Jackson-Pratt drain was placed in the region of the common bile duct reanastomosis site.  The wound was then closed in layers as follows after the second sponge count was verified.  The posterior rectus sheath and peritoneum closed with a running suture of 0 chromic catgut.  The anterior rectus sheath and midline closed with #1 Novofil.  The subcutaneous tissues irrigated and the skin closed with staples.  The trocar holes were also closed with staples.  The Jackson-Pratt drain and the stent which was a size 5 pediatric feeding tube was secured to the skin with a 2-0 nylon suture.  Sterile dressings were applied.  Anesthetic reversed.  Patient removed from the operating room to the recovery room in stable  condition having tolerated the procedure well.  DESCRIPTION OF PROCEDURE: DD:  06/14/00 TD:  06/14/00 Job: [id number]

## 2010-10-09 NOTE — Assessment & Plan Note (Signed)
[facility]                                   ON-CALL NOTE   [name], [name]                        MRN:          [id number]  DATE:12/12/2005                            DOB:          March 13, 1956    Telephone call on December 12, 2005 at 7:25 p.m.   Telephone number is [phone].   GASTROENTEROLOGIST:  Dr. Melvia Heaps.   [name] is a patient that I know as well, who has recurrent common  bile duct stones and has a biliary stent indwelling.  He remains on Urso for  recurrent problems with stones.  He has had several episodes of biliary  colic.  He notes that over the past 3-6 hours he has had some mild  epigastric fullness and notes that his urine has become slightly darker.  He  has no fevers, chills, abdominal pain, nausea or vomiting. He feels this is  the beginning of biliary colic attack as he has had the typical prodromal  symptoms in the past. He would like to remain at home if possible but is  calling to try to arrange evaluation either tonight if necessary, or  urgently tomorrow.  I have advised him to remain on clear liquids and have  called him in prescriptions for ciprofloxacin 500 b.i.d., Metronidazole 500  b.i.d. and Vicodin 5/500 1-2 q.6 hours p.r.n. pain, #20 tablets for each one  of those prescriptions and no refills for any of those prescriptions.  He is  to begin these medications this evening, remain on clear liquids, and call  me should he have any worsening in symptoms.  He will remain n.p.o. after  7:30 tomorrow morning and I will speak with Dr. Arlyce Dice or one of our other  physicians if Dr. Arlyce Dice is not available, about urgent blood work and  possible ERCP tomorrow if it appears indicated. He will call me tonight with  any worsening for possible hospitalization this evening, if the above  measures fail to keep his symptoms under control.                                   Venita Lick. Pleas Koch., MD, [facility]   MTS/MedQ  DD:  12/12/2005  DT:  12/12/2005  Job #:  [id number]   cc:   Barbette Hair. Arlyce Dice, MD, [facility]

## 2010-10-09 NOTE — Discharge Summary (Signed)
[facility]  Patient:    [name], [name] Visit Number: [id number] MRN: [id number]          Service Type: MED Location: [id number] Attending Physician:  Mervin Hack Dictated by:   Sammuel Cooper, P.A.-C Admit Date:  08/17/2001 Discharge Date: 08/19/2001   CC:         Luisa Hart L. Lurene Shadow, M.D.  Sharyn Dross., M.D.   Discharge Summary  ADMISSION DIAGNOSES: 53. A 55 year old male with jaundice and abnormal liver function tests felt    secondary to recurrent common bile duct stricture. 2. Postoperative common bile duct stricture requiring endoscopic retrograde    cholangiopancreatography, balloon dilation, and placement of stent in    February 2002 to February 2003, with stent removal on July 07, 2001. 3. History of post endoscopic retrograde cholangiopancreatography pancreatitis    in February 2002. 4. Status post cholecystectomy with bile duct injury in January 2002,    complicated as above. 5. Hyperglycemia. 6. History of hypertension. 7. 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  DISCHARGE DIAGNOSES: 1. Stable, status post endoscopic retrograde cholangiopancreatography with    removal of common bile duct stones and stent placement secondary to common    hepatic duct stricture. 2. Other diagnoses as listed above.  PROCEDURES:  Endoscopic retrograde cholangiopancreatography and stent placement on 08/18/01, per Dr. Arlyce Dice.  HISTORY OF PRESENT ILLNESS:  [name] is a pleasant 55 year old African-American male who is well known to the GI service, also employed as a Advice worker at [employer].  He has a history as described above.  The patient had undergone laparoscopic cholecystectomy in January 2002, this was complicated by common bile duct injury, and his surgery was converted to an open procedure.  He required T-tube placement, and then ultimately developed stricture of  the common bile duct and underwent endoscopic retrograde cholangiopancreatography in February 2002.  He had stent placed at that time. Unfortunately, he did get post endoscopic retrograde cholangiopancreatography pancreatitis which required a more prolonged hospitalization.  He had done well in the interim, and Dr. Arlyce Dice had undergone endoscopic retrograde cholangiopancreatography and stent removal in mid-February 2003.  At that time, cholangiogram showed no evidence for extravasation, there was some slight narrowing noted up in the common hepatic duct where his prior stricture was, but it did not appear to be significant.  There was also some slight narrowing in the common bile duct.  His liver function tests had been normal. He had been doing well.  The patient had been doing well until 2 to 3 days ago, when he noted some darkening of his urine, mild epigastric discomfort, and queasiness, which progressed to worsening abdominal pain, nausea and vomiting.  He was then seen by Dr. Lina Sar who was covering on call.  There was a suspicion for symptoms secondary to common duct stricture.  He was admitted to the hospital for antibiotic coverage and further diagnostic workup.  On admission, found to have significantly elevated liver function tests with a bilirubin of 3.9. Plans were made for repeat endoscopic retrograde cholangiopancreatography.  LABORATORY DATA:  On 08/17/01, white blood cell count 12.6, hemoglobin 15.1, hematocrit 43.3, MCV 91.6, platelets 208.  Prothrombin time 12.6, INR 0.9, PTT 26.  Electrolytes within normal limits.  Glucose 180 on admission.  BUN 10, creatinine 1.2, albumin 3.9.  On 08/18/01, sodium 137, potassium 3.9, glucose 150, BUN 9, creatinine 1.4.  On 08/19/01, potassium was 3.2, glucose 136. Glycosylated  hemoglobin was obtained and this was normal at 5.6.  Liver function tests on admission, 08/17/01, showed a total bilirubin of 3.9, alkaline phosphatase 172,  SGOT 290, SGPT 508.  Followup on 08/19/01, showed a total bilirubin of 4.3, alkaline phosphatase 141, ALT 318, AST 118. Urinalysis on admission showed 3 to 6 white blood cells.  EKG on admission showed a normal sinus rhythm.  X-ray studies:  Chest x-ray on admission negative.  HOSPITAL COURSE:  The patient was admitted to the service of Dr. Lina Sar who was covering on call.  He was initially placed on IV Cipro, and given Dilaudid and Phenergan as needed for pain control due to severe pain. Baseline labs were obtained as outlined above.  It was felt that his symptoms likely were due to stricture of the bile duct, and arrangements were made for repeat endoscopic retrograde cholangiopancreatography.  This was done per Dr. Arlyce Dice with finding of mild stricture of the proximal common bile duct with stones in the common hepatic duct which were removed with balloon.  There was a smooth stricture in the common hepatic duct appearing benign, and a 9 cm/30 mm stent was placed above the stricture.  The patient tolerated the procedure well.  His liver tests improved, and his pain markedly improved.  By 08/19/01, he was feeling much better, tolerating a solid diet, and was allowed discharge to home on oral Cipro for 5 more days with instructions to follow up with Dr. Arlyce Dice in a few weeks.  DISCHARGE MEDICATIONS: 1. Cipro 250 mg b.i.d. x5 days. 2. Other medications as previous, to include Diovan 10 mg q.d., Nexium 40 mg    p.o. q.d. p.r.n.  CONDITION ON DISCHARGE:  Stable and improved.  Dictation taken entirely from the notes.  The patient was not seen by myself during this admission. Dictated by:   Sammuel Cooper, P.A.-C Attending Physician:  Mervin Hack DD:  09/01/01 TD:  09/02/01 Job: [id number]

## 2017-02-01 ENCOUNTER — Encounter: Payer: Self-pay | Admitting: Gastroenterology

## 2017-03-23 ENCOUNTER — Ambulatory Visit (AMBULATORY_SURGERY_CENTER): Payer: Self-pay | Admitting: *Deleted

## 2017-03-23 VITALS — Ht 65.5 in | Wt 235.0 lb

## 2017-03-23 DIAGNOSIS — Z86010 Personal history of colonic polyps: Secondary | ICD-10-CM

## 2017-03-23 MED ORDER — SUPREP BOWEL PREP KIT 17.5-3.13-1.6 GM/177ML PO SOLN: 1.0000 | Freq: Once | ORAL | 0 refills | Status: AC

## 2017-03-23 NOTE — Progress Notes (Signed)
Patient denies any allergies to egg or soy products. Patient denies complications with anesthesia/sedation.  Patient denies oxygen use at home and denies diet medications.  Denies information on colonoscopy. 

## 2017-03-30 ENCOUNTER — Encounter: Payer: Self-pay | Admitting: Gastroenterology

## 2017-04-06 ENCOUNTER — Encounter: Payer: Self-pay | Admitting: Gastroenterology

## 2017-04-06 ENCOUNTER — Ambulatory Visit (AMBULATORY_SURGERY_CENTER): Payer: PRIVATE HEALTH INSURANCE | Admitting: Gastroenterology

## 2017-04-06 VITALS — BP 111/70 | HR 65 | Temp 97.8°F | Resp 14 | Ht 65.5 in | Wt 235.0 lb

## 2017-04-06 DIAGNOSIS — Z860100 Personal history of colon polyps, unspecified: Secondary | ICD-10-CM

## 2017-04-06 DIAGNOSIS — D123 Benign neoplasm of transverse colon: Secondary | ICD-10-CM

## 2017-04-06 DIAGNOSIS — D125 Benign neoplasm of sigmoid colon: Secondary | ICD-10-CM

## 2017-04-06 DIAGNOSIS — K635 Polyp of colon: Secondary | ICD-10-CM | POA: Diagnosis not present

## 2017-04-06 MED ORDER — SODIUM CHLORIDE 0.9 % IV SOLN: 500.0000 mL | INTRAVENOUS | Status: DC

## 2017-04-06 NOTE — Patient Instructions (Signed)
**   Handouts given on polyps and diverticulosis **   YOU HAD AN ENDOSCOPIC PROCEDURE TODAY AT THE [facility]:   Refer to the procedure report that was given to you for any specific questions about what was found during the examination.  If the procedure report does not answer your questions, please call your gastroenterologist to clarify.  If you requested that your care partner not be given the details of your procedure findings, then the procedure report has been included in a sealed envelope for you to review at your convenience later.  YOU SHOULD EXPECT: Some feelings of bloating in the abdomen. Passage of more gas than usual.  Walking can help get rid of the air that was put into your GI tract during the procedure and reduce the bloating. If you had a lower endoscopy (such as a colonoscopy or flexible sigmoidoscopy) you may notice spotting of blood in your stool or on the toilet paper. If you underwent a bowel prep for your procedure, you may not have a normal bowel movement for a few days.  Please Note:  You might notice some irritation and congestion in your nose or some drainage.  This is from the oxygen used during your procedure.  There is no need for concern and it should clear up in a day or so.  SYMPTOMS TO REPORT IMMEDIATELY:   Following lower endoscopy (colonoscopy or flexible sigmoidoscopy):  Excessive amounts of blood in the stool  Significant tenderness or worsening of abdominal pains  Swelling of the abdomen that is new, acute  Fever of 100F or higher  For urgent or emergent issues, a gastroenterologist can be reached at any hour by calling [phone].   DIET:  We do recommend a small meal at first, but then you may proceed to your regular diet.  Drink plenty of fluids but you should avoid alcoholic beverages for 24 hours.  ACTIVITY:  You should plan to take it easy for the rest of today and you should NOT DRIVE or use heavy machinery until tomorrow (because  of the sedation medicines used during the test).    FOLLOW UP: Our staff will call the number listed on your records the next business day following your procedure to check on you and address any questions or concerns that you may have regarding the information given to you following your procedure. If we do not reach you, we will leave a message.  However, if you are feeling well and you are not experiencing any problems, there is no need to return our call.  We will assume that you have returned to your regular daily activities without incident.  If any biopsies were taken you will be contacted by phone or by letter within the next 1-3 weeks.  Please call us at [phone] if you have not heard about the biopsies in 3 weeks.    SIGNATURES/CONFIDENTIALITY: You and/or your care partner have signed paperwork which will be entered into your electronic medical record.  These signatures attest to the fact that that the information above on your After Visit Summary has been reviewed and is understood.  Full responsibility of the confidentiality of this discharge information lies with you and/or your care-partner. 

## 2017-04-06 NOTE — Progress Notes (Signed)
Called to room to assist during endoscopic procedure.  Patient ID and intended procedure confirmed with present staff. Received instructions for my participation in the procedure from the performing physician.  

## 2017-04-06 NOTE — Op Note (Signed)
[place] Patient Name: [name] Procedure Date: 04/06/2017 9:33 AM MRN: [id number] Endoscopist: Mauri Pole , MD Age: 61 Referring MD:  Date of Birth: 1956-01-11 Gender: Male Account #: [id number] Procedure:                Colonoscopy Indications:              Screening for colorectal malignant neoplasm, Last                            colonoscopy: 2008 Medicines:                Monitored Anesthesia Care Procedure:                Pre-Anesthesia Assessment:                           - Prior to the procedure, a History and Physical                            was performed, and patient medications and                            allergies were reviewed. The patient's tolerance of                            previous anesthesia was also reviewed. The risks                            and benefits of the procedure and the sedation                            options and risks were discussed with the patient.                            All questions were answered, and informed consent                            was obtained. Prior Anticoagulants: The patient has                            taken no previous anticoagulant or antiplatelet                            agents. ASA Grade Assessment: II - A patient with                            mild systemic disease. After reviewing the risks                            and benefits, the patient was deemed in                            satisfactory condition to undergo the procedure.  After obtaining informed consent, the colonoscope                            was passed under direct vision. Throughout the                            procedure, the patient's blood pressure, pulse, and                            oxygen saturations were monitored continuously. The                            Colonoscope was introduced through the anus and                            advanced to the the terminal ileum, with                             identification of the appendiceal orifice and IC                            valve. The colonoscopy was performed without                            difficulty. The patient tolerated the procedure                            well. The quality of the bowel preparation was                            good. The terminal ileum, ileocecal valve,                            appendiceal orifice, and rectum were photographed. Scope In: 9:40:09 AM Scope Out: 9:56:16 AM Scope Withdrawal Time: 0 hours 11 minutes 50 seconds  Total Procedure Duration: 0 hours 16 minutes 7 seconds  Findings:                 The perianal and digital rectal examinations were                            normal.                           Three sessile polyps were found in the sigmoid                            colon and transverse colon. The polyps were 5 to 8                            mm in size. These polyps were removed with a cold                            snare. Resection and retrieval were complete.  Scattered small and large-mouthed diverticula were                            found in the sigmoid colon, descending colon and                            transverse colon.                           Non-bleeding internal hemorrhoids were found during                            retroflexion. The hemorrhoids were small. Complications:            No immediate complications. Estimated Blood Loss:     Estimated blood loss was minimal. Impression:               - Three 5 to 8 mm polyps in the sigmoid colon and                            in the transverse colon, removed with a cold snare.                            Resected and retrieved.                           - Diverticulosis in the sigmoid colon, in the                            descending colon and in the transverse colon.                           - Non-bleeding internal hemorrhoids. Recommendation:           - Patient  has a contact number available for                            emergencies. The signs and symptoms of potential                            delayed complications were discussed with the                            patient. Return to normal activities tomorrow.                            Written discharge instructions were provided to the                            patient.                           - Resume previous diet.                           - Continue present medications.                           -  Await pathology results.                           - Repeat colonoscopy in 3 - 5 years for                            surveillance based on pathology results. Mauri Pole, MD 04/06/2017 10:02:25 AM This report has been signed electronically.

## 2017-04-06 NOTE — Progress Notes (Signed)
Pt's states no medical or surgical changes since previsit or office visit. 

## 2017-04-06 NOTE — Progress Notes (Signed)
To PACU, VSS. Report to [email or web address] 

## 2017-04-07 ENCOUNTER — Telehealth: Payer: Self-pay

## 2017-04-07 NOTE — Telephone Encounter (Signed)
  Follow up Call-  Call back number 04/06/2017  Post procedure Call Back phone  # [phone]  Permission to leave phone message Yes  Some recent data might be hidden     Patient questions:  Do you have a fever, pain , or abdominal swelling? No. Pain Score  0 *  Have you tolerated food without any problems? Yes.    Have you been able to return to your normal activities? Yes.    Do you have any questions about your discharge instructions: Diet   No. Medications  No. Follow up visit  No.  Do you have questions or concerns about your Care? No.  Actions: * If pain score is 4 or above: No action needed, pain <4.

## 2017-04-11 ENCOUNTER — Encounter: Payer: Self-pay | Admitting: Gastroenterology

## 2018-03-30 ENCOUNTER — Encounter: Payer: Self-pay | Admitting: Internal Medicine

## 2018-03-30 ENCOUNTER — Ambulatory Visit (INDEPENDENT_AMBULATORY_CARE_PROVIDER_SITE_OTHER): Payer: PRIVATE HEALTH INSURANCE | Admitting: Internal Medicine

## 2018-03-30 VITALS — BP 138/80 | HR 69 | Temp 98.0°F | Ht 65.5 in | Wt 233.4 lb

## 2018-03-30 DIAGNOSIS — M7051 Other bursitis of knee, right knee: Secondary | ICD-10-CM | POA: Diagnosis not present

## 2018-03-30 DIAGNOSIS — Z7982 Long term (current) use of aspirin: Secondary | ICD-10-CM

## 2018-03-30 DIAGNOSIS — Z89511 Acquired absence of right leg below knee: Secondary | ICD-10-CM

## 2018-03-30 MED ORDER — DOXYCYCLINE HYCLATE 100 MG PO TABS: 100.0000 mg | ORAL_TABLET | Freq: Two times a day (BID) | ORAL | 0 refills | Status: DC

## 2018-03-30 MED ORDER — DOXYCYCLINE HYCLATE 100 MG PO TABS: 100.0000 mg | ORAL_TABLET | Freq: Two times a day (BID) | ORAL | Status: DC

## 2018-03-30 NOTE — Progress Notes (Signed)
Subjective:     Patient ID: [name] , male    DOB: [birth year] , 62 y.o.   MRN: [id number]   Chief Complaint  Patient presents with  . Bursitis    bursted last night, would like anitbiotic to treat     HPI R lower leg tenderness which started on Friday last week, saw ortho Monday and was diagnosed with bursitis due to the tibia edge getting close to the skin area. Ortho advised pt to do conservative treatment with ice, elevation. This got better and pain is gone as well as swelling, but last night felt something wet on his sheets and this am saw some clear yellow matter draining from an old incision. Pt denies hotness, or redness on this area, but since he had an infection on this area 4 months ago at the time had mal odorous yellow drainage, would like preventive treatment to prevent another infection. Denies history of staph infections.  His orthopedist may end up trimming the corner of the Tibia to prevent from this re-occurring.   Past Medical History:  Diagnosis Date  . GERD (gastroesophageal reflux disease)   . Hx of right BKA ([place])    uses prosthetic   . Hyperlipidemia   . Hypertension   . Liver disorder    tx with ursodiol      Family History  Problem Relation Age of Onset  . Colon polyps Father   . Colon cancer Neg Hx   . Rectal cancer Neg Hx   . Stomach cancer Neg Hx      Current Outpatient Medications:  .  acetaminophen (TYLENOL) 500 MG tablet, Take 500 mg every 6 (six) hours as needed by mouth., Disp: , Rfl:  .  aspirin 81 MG chewable tablet, Chew 81 mg by mouth daily., Disp: , Rfl:  .  atorvastatin (LIPITOR) 20 MG tablet, Take 40 mg by mouth at bedtime., Disp: , Rfl:  .  ranitidine (ZANTAC) 150 MG tablet, Take 150 mg by mouth 2 (two) times daily., Disp: , Rfl:  .  ursodiol (ACTIGALL) 250 MG tablet, Take 250 mg by mouth 2 (two) times daily., Disp: , Rfl:  .  valsartan-hydrochlorothiazide (DIOVAN-HCT) 320-25 MG tablet, Take 1 tablet by mouth daily., Disp: ,  Rfl:    No Known Allergies   Review of Systems  Constitutional: Negative for chills and fever.  Musculoskeletal: Negative for arthralgias, gait problem, joint swelling and myalgias.  Skin: Negative for color change, pallor, rash and wound.  Hematological: Negative for adenopathy.     Today's Vitals   03/30/18 0926  BP: 138/80  Pulse: 69  Temp: 98 F (36.7 C)  TempSrc: Oral  SpO2: 96%  Weight: 233 lb 6.4 oz (105.9 kg)  Height: 5' 5.5" (1.664 m)   Body mass index is 38.25 kg/m.   Objective:  Physical Exam  Constitutional: He is oriented to person, place, and time. He appears well-developed and well-nourished. No distress.  HENT:  Head: Normocephalic.  Right Ear: External ear normal.  Left Ear: External ear normal.  Nose: Nose normal.  Eyes: Conjunctivae are normal. Right eye exhibits no discharge. Left eye exhibits no discharge. No scleral icterus.  Neck: Neck supple.  Pulmonary/Chest: Effort normal.  Musculoskeletal: He exhibits no edema.  Neurological: He is alert and oriented to person, place, and time.  Skin: Skin is warm and dry. No rash noted. He is not diaphoretic. No erythema. No pallor.  R lateral stump area shows a 1 cm linear macerated  area from which looks like drainage has been coming. Not red or hot. I squeezed this area and 2 drops of clear yellow matter drained that looks like synovial fluid.   Psychiatric: He has a normal mood and affect. His behavior is normal. Judgment and thought content normal.    Assessment And Plan:    1. Bursitis of right knee, unspecified bursa- with fluid seeping. No signs of active cellulitis.  I placed him on Doxy for 10 days as prophylaxis. Needs to FU with ortho as needed.     Deneice Wack RODRIGUEZ-SOUTHWORTH, PA-C

## 2018-03-30 NOTE — Patient Instructions (Signed)
  Come back if you get worse or go to ER if we are closed.      Cellulitis, Adult Cellulitis is a skin infection. The infected area is usually red and sore. This condition occurs most often in the arms and lower legs. It is very important to get treated for this condition. Follow these instructions at home:  Take over-the-counter and prescription medicines only as told by your doctor.  If you were prescribed an antibiotic medicine, take it as told by your doctor. Do not stop taking the antibiotic even if you start to feel better.  Drink enough fluid to keep your pee (urine) clear or pale yellow.  Do not touch or rub the infected area.  Raise (elevate) the infected area above the level of your heart while you are sitting or lying down.  Place warm or cold wet cloths (warm or cold compresses) on the infected area. Do this as told by your doctor.  Keep all follow-up visits as told by your doctor. This is important. These visits let your doctor make sure your infection is not getting worse. Contact a doctor if:  You have a fever.  Your symptoms do not get better after 1-2 days of treatment.  Your bone or joint under the infected area starts to hurt after the skin has healed.  Your infection comes back. This can happen in the same area or another area.  You have a swollen bump in the infected area.  You have new symptoms.  You feel ill and also have muscle aches and pains. Get help right away if:  Your symptoms get worse.  You feel very sleepy.  You throw up (vomit) or have watery poop (diarrhea) for a long time.  There are red streaks coming from the infected area.  Your red area gets larger.  Your red area turns darker. This information is not intended to replace advice given to you by your health care provider. Make sure you discuss any questions you have with your health care provider. Document Released: 10/27/2007 Document Revised: 10/16/2015 Document Reviewed:  03/19/2015 Elsevier Interactive Patient Education  2018 [employer].

## 2018-04-17 ENCOUNTER — Other Ambulatory Visit: Payer: Self-pay | Admitting: Nurse Practitioner

## 2018-05-11 ENCOUNTER — Encounter: Payer: Self-pay | Admitting: Nurse Practitioner

## 2018-08-23 ENCOUNTER — Other Ambulatory Visit: Payer: Self-pay

## 2018-08-23 MED ORDER — VALSARTAN-HYDROCHLOROTHIAZIDE 320-25 MG PO TABS: 1.0000 | ORAL_TABLET | Freq: Every day | ORAL | 0 refills | Status: DC

## 2018-09-05 ENCOUNTER — Telehealth: Payer: Self-pay

## 2018-09-05 NOTE — Telephone Encounter (Signed)
Called pt to see if he is still taking his Valsartan HCTZ regularly his ins sent over a letter stating he may not be taking it as directed. Left pt v/m to call office. YRL,RMA

## 2018-12-28 ENCOUNTER — Other Ambulatory Visit: Payer: Self-pay

## 2018-12-28 ENCOUNTER — Encounter: Payer: Self-pay | Admitting: Internal Medicine

## 2018-12-28 ENCOUNTER — Ambulatory Visit (INDEPENDENT_AMBULATORY_CARE_PROVIDER_SITE_OTHER): Payer: PRIVATE HEALTH INSURANCE | Admitting: Internal Medicine

## 2018-12-28 VITALS — BP 140/82 | HR 70 | Temp 98.3°F | Ht 66.8 in | Wt 231.0 lb

## 2018-12-28 DIAGNOSIS — Z89619 Acquired absence of unspecified leg above knee: Secondary | ICD-10-CM

## 2018-12-28 DIAGNOSIS — Z1211 Encounter for screening for malignant neoplasm of colon: Secondary | ICD-10-CM | POA: Diagnosis not present

## 2018-12-28 DIAGNOSIS — Z0001 Encounter for general adult medical examination with abnormal findings: Secondary | ICD-10-CM

## 2018-12-28 DIAGNOSIS — Z125 Encounter for screening for malignant neoplasm of prostate: Secondary | ICD-10-CM

## 2018-12-28 DIAGNOSIS — E782 Mixed hyperlipidemia: Secondary | ICD-10-CM

## 2018-12-28 DIAGNOSIS — Z0000 Encounter for general adult medical examination without abnormal findings: Secondary | ICD-10-CM

## 2018-12-28 DIAGNOSIS — I10 Essential (primary) hypertension: Secondary | ICD-10-CM

## 2018-12-28 DIAGNOSIS — Z89511 Acquired absence of right leg below knee: Secondary | ICD-10-CM | POA: Diagnosis not present

## 2018-12-28 LAB — POCT URINALYSIS DIPSTICK
Bilirubin, UA: NEGATIVE
Blood, UA: NEGATIVE
Glucose, UA: NEGATIVE
Ketones, UA: NEGATIVE
Leukocytes, UA: NEGATIVE
Nitrite, UA: NEGATIVE
Protein, UA: NEGATIVE
Spec Grav, UA: 1.02 (ref 1.010–1.025)
Urobilinogen, UA: 0.2 E.U./dL
pH, UA: 7 (ref 5.0–8.0)

## 2018-12-28 LAB — POCT UA - MICROALBUMIN
Albumin/Creatinine Ratio, Urine, POC: <30
Creatinine, POC: 100 mg/dL
Microalbumin Ur, POC: 10 mg/L

## 2018-12-28 LAB — POC HEMOCCULT BLD/STL (OFFICE/1-CARD/DIAGNOSTIC)
Card #1 Date: 8062020
Fecal Occult Blood, POC: NEGATIVE

## 2018-12-28 MED ORDER — ATORVASTATIN CALCIUM 20 MG PO TABS: 40.0000 mg | ORAL_TABLET | Freq: Every day | ORAL | 0 refills | Status: DC

## 2018-12-28 MED ORDER — VALSARTAN-HYDROCHLOROTHIAZIDE 320-25 MG PO TABS: 1.0000 | ORAL_TABLET | Freq: Every day | ORAL | 0 refills | Status: DC

## 2018-12-28 NOTE — Progress Notes (Signed)
Subjective:     Patient ID: [name] , male    DOB: [birth year] , 63 y.o.   MRN: [id number]   Chief Complaint  Patient presents with  . Annual Exam    HPI Pt is here for annual wellness.  Has been doing well and denies any complaints.    Past Medical History:  Diagnosis Date  . GERD (gastroesophageal reflux disease)   . 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 Hyperlipidemia   . Hypertension   . Liver disorder    tx with ursodiol      Family History  Problem Relation Age of Onset  . Colon polyps Father   . Colon cancer Neg Hx   . Rectal cancer Neg Hx   . Stomach cancer Neg Hx      Current Outpatient Medications:  .  acetaminophen (TYLENOL) 500 MG tablet, Take 500 mg every 6 (six) hours as needed by mouth., Disp: , Rfl:  .  aspirin 81 MG chewable tablet, Chew 81 mg by mouth daily., Disp: , Rfl:  .  atorvastatin (LIPITOR) 20 MG tablet, Take 40 mg by mouth at bedtime., Disp: , Rfl:  .  ranitidine (ZANTAC) 150 MG tablet, Take 150 mg by mouth 2 (two) times daily., Disp: , Rfl:  .  ursodiol (ACTIGALL) 250 MG tablet, Take 250 mg by mouth 2 (two) times daily., Disp: , Rfl:  .  valsartan-hydrochlorothiazide (DIOVAN-HCT) 320-25 MG tablet, Take 1 tablet by mouth daily., Disp: 90 tablet, Rfl: 0 .  doxycycline (VIBRA-TABS) 100 MG tablet, Take 1 tablet (100 mg total) by mouth 2 (two) times daily. (Patient not taking: Reported on 12/28/2018), Disp: 20 tablet, Rfl: 0   No Known Allergies   Review of Systems  Has R leg stump since age 56 y. No problems with his R stum inflammation or infection in the past few months. The rest of ROS is neg.  Today's Vitals   12/28/18 1202  BP: 140/82  Pulse: 70  Temp: 98.3 F (36.8 C)  TempSrc: Oral  SpO2: 96%  Weight: 231 lb (104.8 kg)  Height: 5' 6.8" (1.697 m)   Body mass index is 36.4 kg/m.   Objective:  Physical Exam  BP 140/82   Pulse 70   Temp 98.3 F (36.8 C) (Oral)   Ht 5' 6.8" (1.697 m)   Wt 231 lb (104.8 kg)    SpO2 96%   BMI 36.40 kg/m   General Appearance:    Alert, cooperative, no distress, appears stated age  Head:    Normocephalic, without obvious abnormality, atraumatic  Eyes:    PERRL, conjunctiva/corneas clear, EOM's intact, fundi    benign, both eyes       Ears:    Normal TM's and external ear canals, both ears  Nose:   Nares normal, septum midline, mucosa normal, no drainage   or sinus tenderness  Throat:   Lips, mucosa, and tongue normal; teeth and gums normal  Neck:   Supple, symmetrical, trachea midline, no adenopathy;       thyroid:  No enlargement/tenderness/nodules; no carotid   Bruit.  Back:     Symmetric, no curvature, ROM normal  Lungs:     Clear to auscultation bilaterally, respirations unlabored  Chest wall:    No tenderness or deformity  Heart:    Regular rate and rhythm, S1 and S2 normal, no murmur, rub   or gallop  Abdomen:     Soft, non-tender, bowel  sounds active all four quadrants,    no masses, no organomegaly  Genitalia:    Normal male without lesion, discharge or tenderness  Rectal:    Normal tone, normal prostate, no masses or tenderness;   guaiac negative stool  Extremities:   L Extremity is normal, atraumatic, no cyanosis or edema. Has L below knee prosthesis on and was more removed for exam.   Pulses:   2+ and symmetric all extremities  Skin:   Skin color, texture, turgor normal, no rashes or lesions  Lymph nodes:   Cervical, supraclavicular, and axillary nodes normal  Neurologic:   CNII-XII intact. Normal strength, sensation and reflexes      throughout   EKG- sinus rhythm, WNL  Assessment And Plan:    1. Encounter for general adult medical examination with abnormal findings- routine. He will get his Fu 1 y  2. Mixed hyperlipidemia- chronic. May continue same medication. FU 6 month.  - atorvastatin (LIPITOR) 20 MG tablet; Take 2 tablets (40 mg total) by mouth at bedtime.  Dispense: 90 tablet; Refill: 0 - Lipid Profile - TSH - T4, Free - T3,  free  3. Essential hypertension- stable. May continue current medication. FU 3 month.  - EKG 12-Lead - POCT Urinalysis Dipstick (81002) - POCT UA - Microalbumin - valsartan-hydrochlorothiazide (DIOVAN-HCT) 320-25 MG tablet; Take 1 tablet by mouth daily.  Dispense: 90 tablet; Refill: 0 - CMP14 + Anion Gap - CBC no Diff  4. Screening for prostate cancer- screen - PSA  5. Screen for colon cancer- screen.  - POC Hemoccult Bld/Stl (1-Cd Office Dx)- neg.   6. 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 No action    Isahia Hollerbach RODRIGUEZ-SOUTHWORTH, PA-C    THE PATIENT IS ENCOURAGED TO PRACTICE SOCIAL DISTANCING DUE TO THE COVID-19 PANDEMIC.

## 2018-12-29 LAB — CMP14 + ANION GAP
ALT: 25 IU/L (ref 0–44)
AST: 23 IU/L (ref 0–40)
Albumin/Globulin Ratio: 1.6 (ref 1.2–2.2)
Albumin: 4.3 g/dL (ref 3.8–4.8)
Alkaline Phosphatase: 60 IU/L (ref 39–117)
Anion Gap: 12 mmol/L (ref 10.0–18.0)
BUN/Creatinine Ratio: 12 (ref 10–24)
BUN: 15 mg/dL (ref 8–27)
Bilirubin Total: 0.5 mg/dL (ref 0.0–1.2)
CO2: 28 mmol/L (ref 20–29)
Calcium: 10 mg/dL (ref 8.6–10.2)
Chloride: 100 mmol/L (ref 96–106)
Creatinine, Ser: 1.23 mg/dL (ref 0.76–1.27)
GFR calc Af Amer: 72 mL/min/{1.73_m2} (ref >59)
GFR calc non Af Amer: 63 mL/min/{1.73_m2} (ref >59)
Globulin, Total: 2.7 g/dL (ref 1.5–4.5)
Glucose: 100 mg/dL — ABNORMAL HIGH (ref 65–99)
Potassium: 4.4 mmol/L (ref 3.5–5.2)
Sodium: 140 mmol/L (ref 134–144)
Total Protein: 7 g/dL (ref 6.0–8.5)

## 2018-12-29 LAB — CBC
Hematocrit: 42.7 % (ref 37.5–51.0)
Hemoglobin: 15 g/dL (ref 13.0–17.7)
MCH: 31.8 pg (ref 26.6–33.0)
MCHC: 35.1 g/dL (ref 31.5–35.7)
MCV: 91 fL (ref 79–97)
Platelets: 219 10*3/uL (ref 150–450)
RBC: 4.72 x10E6/uL (ref 4.14–5.80)
RDW: 13.2 % (ref 11.6–15.4)
WBC: 5.9 10*3/uL (ref 3.4–10.8)

## 2018-12-29 LAB — LIPID PANEL
Chol/HDL Ratio: 4.7 ratio (ref 0.0–5.0)
Cholesterol, Total: 189 mg/dL (ref 100–199)
HDL: 40 mg/dL (ref >39)
LDL Calculated: 122 mg/dL — ABNORMAL HIGH (ref 0–99)
Triglycerides: 133 mg/dL (ref 0–149)
VLDL Cholesterol Cal: 27 mg/dL (ref 5–40)

## 2018-12-29 LAB — TSH: TSH: 1.05 u[IU]/mL (ref 0.450–4.500)

## 2018-12-29 LAB — T4, FREE: Free T4: 1.04 ng/dL (ref 0.82–1.77)

## 2018-12-29 LAB — T3, FREE: T3, Free: 3.1 pg/mL (ref 2.0–4.4)

## 2018-12-29 LAB — PSA: Prostate Specific Ag, Serum: 0.6 ng/mL (ref 0.0–4.0)

## 2019-03-28 ENCOUNTER — Other Ambulatory Visit: Payer: Self-pay | Admitting: Internal Medicine

## 2019-03-28 DIAGNOSIS — I10 Essential (primary) hypertension: Secondary | ICD-10-CM

## 2019-04-02 ENCOUNTER — Ambulatory Visit: Payer: PRIVATE HEALTH INSURANCE | Admitting: Nurse Practitioner

## 2019-04-04 ENCOUNTER — Other Ambulatory Visit: Payer: Self-pay

## 2019-04-04 DIAGNOSIS — Z20822 Contact with and (suspected) exposure to covid-19: Secondary | ICD-10-CM

## 2019-04-08 ENCOUNTER — Inpatient Hospital Stay (HOSPITAL_COMMUNITY)
Admission: EM | Admit: 2019-04-08 | Discharge: 2019-05-07 | DRG: 177 | Disposition: A | Discharge status: Home Health | Payer: PRIVATE HEALTH INSURANCE | Attending: Internal Medicine | Admitting: Internal Medicine

## 2019-04-08 ENCOUNTER — Other Ambulatory Visit: Payer: Self-pay

## 2019-04-08 ENCOUNTER — Emergency Department (HOSPITAL_COMMUNITY): Payer: PRIVATE HEALTH INSURANCE

## 2019-04-08 ENCOUNTER — Encounter (HOSPITAL_COMMUNITY): Payer: Self-pay | Admitting: Emergency Medicine

## 2019-04-08 DIAGNOSIS — U071 COVID-19: Secondary | ICD-10-CM | POA: Diagnosis present

## 2019-04-08 DIAGNOSIS — Z7982 Long term (current) use of aspirin: Secondary | ICD-10-CM

## 2019-04-08 DIAGNOSIS — R0600 Dyspnea, unspecified: Secondary | ICD-10-CM

## 2019-04-08 DIAGNOSIS — J1289 Other viral pneumonia: Secondary | ICD-10-CM | POA: Diagnosis present

## 2019-04-08 DIAGNOSIS — I82442 Acute embolism and thrombosis of left tibial vein: Secondary | ICD-10-CM | POA: Diagnosis present

## 2019-04-08 DIAGNOSIS — N182 Chronic kidney disease, stage 2 (mild): Secondary | ICD-10-CM | POA: Diagnosis present

## 2019-04-08 DIAGNOSIS — N400 Enlarged prostate without lower urinary tract symptoms: Secondary | ICD-10-CM | POA: Diagnosis present

## 2019-04-08 DIAGNOSIS — Z09 Encounter for follow-up examination after completed treatment for conditions other than malignant neoplasm: Secondary | ICD-10-CM

## 2019-04-08 DIAGNOSIS — N200 Calculus of kidney: Secondary | ICD-10-CM | POA: Diagnosis present

## 2019-04-08 DIAGNOSIS — T380X5A Adverse effect of glucocorticoids and synthetic analogues, initial encounter: Secondary | ICD-10-CM | POA: Diagnosis not present

## 2019-04-08 DIAGNOSIS — E785 Hyperlipidemia, unspecified: Secondary | ICD-10-CM | POA: Diagnosis present

## 2019-04-08 DIAGNOSIS — R0902 Hypoxemia: Secondary | ICD-10-CM

## 2019-04-08 DIAGNOSIS — R29700 NIHSS score 0: Secondary | ICD-10-CM | POA: Diagnosis not present

## 2019-04-08 DIAGNOSIS — A419 Sepsis, unspecified organism: Secondary | ICD-10-CM

## 2019-04-08 DIAGNOSIS — Z6837 Body mass index (BMI) 37.0-37.9, adult: Secondary | ICD-10-CM

## 2019-04-08 DIAGNOSIS — E1165 Type 2 diabetes mellitus with hyperglycemia: Secondary | ICD-10-CM | POA: Diagnosis not present

## 2019-04-08 DIAGNOSIS — I2694 Multiple subsegmental pulmonary emboli without acute cor pulmonale: Secondary | ICD-10-CM | POA: Diagnosis present

## 2019-04-08 DIAGNOSIS — J181 Lobar pneumonia, unspecified organism: Secondary | ICD-10-CM

## 2019-04-08 DIAGNOSIS — I10 Essential (primary) hypertension: Secondary | ICD-10-CM | POA: Diagnosis present

## 2019-04-08 DIAGNOSIS — I63512 Cerebral infarction due to unspecified occlusion or stenosis of left middle cerebral artery: Secondary | ICD-10-CM

## 2019-04-08 DIAGNOSIS — D696 Thrombocytopenia, unspecified: Secondary | ICD-10-CM | POA: Diagnosis present

## 2019-04-08 DIAGNOSIS — K8020 Calculus of gallbladder without cholecystitis without obstruction: Secondary | ICD-10-CM | POA: Diagnosis present

## 2019-04-08 DIAGNOSIS — Z79899 Other long term (current) drug therapy: Secondary | ICD-10-CM

## 2019-04-08 DIAGNOSIS — R040 Epistaxis: Secondary | ICD-10-CM | POA: Diagnosis not present

## 2019-04-08 DIAGNOSIS — G4730 Sleep apnea, unspecified: Secondary | ICD-10-CM | POA: Diagnosis present

## 2019-04-08 DIAGNOSIS — J189 Pneumonia, unspecified organism: Secondary | ICD-10-CM | POA: Diagnosis present

## 2019-04-08 DIAGNOSIS — R7401 Elevation of levels of liver transaminase levels: Secondary | ICD-10-CM | POA: Diagnosis present

## 2019-04-08 DIAGNOSIS — I2699 Other pulmonary embolism without acute cor pulmonale: Secondary | ICD-10-CM | POA: Diagnosis present

## 2019-04-08 DIAGNOSIS — K769 Liver disease, unspecified: Secondary | ICD-10-CM | POA: Diagnosis present

## 2019-04-08 DIAGNOSIS — I129 Hypertensive chronic kidney disease with stage 1 through stage 4 chronic kidney disease, or unspecified chronic kidney disease: Secondary | ICD-10-CM | POA: Diagnosis present

## 2019-04-08 DIAGNOSIS — D6869 Other thrombophilia: Secondary | ICD-10-CM | POA: Diagnosis present

## 2019-04-08 DIAGNOSIS — E874 Mixed disorder of acid-base balance: Secondary | ICD-10-CM | POA: Diagnosis present

## 2019-04-08 DIAGNOSIS — N179 Acute kidney failure, unspecified: Secondary | ICD-10-CM | POA: Diagnosis present

## 2019-04-08 DIAGNOSIS — E669 Obesity, unspecified: Secondary | ICD-10-CM | POA: Diagnosis present

## 2019-04-08 DIAGNOSIS — R482 Apraxia: Secondary | ICD-10-CM | POA: Diagnosis not present

## 2019-04-08 DIAGNOSIS — M109 Gout, unspecified: Secondary | ICD-10-CM | POA: Diagnosis not present

## 2019-04-08 DIAGNOSIS — J1282 Pneumonia due to coronavirus disease 2019: Secondary | ICD-10-CM | POA: Diagnosis present

## 2019-04-08 DIAGNOSIS — R0602 Shortness of breath: Secondary | ICD-10-CM

## 2019-04-08 DIAGNOSIS — I82452 Acute embolism and thrombosis of left peroneal vein: Secondary | ICD-10-CM | POA: Diagnosis not present

## 2019-04-08 DIAGNOSIS — K219 Gastro-esophageal reflux disease without esophagitis: Secondary | ICD-10-CM | POA: Diagnosis present

## 2019-04-08 DIAGNOSIS — Z89511 Acquired absence of right leg below knee: Secondary | ICD-10-CM

## 2019-04-08 DIAGNOSIS — J9601 Acute respiratory failure with hypoxia: Secondary | ICD-10-CM | POA: Diagnosis present

## 2019-04-08 DIAGNOSIS — R739 Hyperglycemia, unspecified: Secondary | ICD-10-CM | POA: Diagnosis present

## 2019-04-08 HISTORY — DX: Type 2 diabetes mellitus without complications: E11.9

## 2019-04-08 LAB — CBC WITH DIFFERENTIAL/PLATELET
Abs Immature Granulocytes: 0.07 10*3/uL (ref 0.00–0.07)
Basophils Absolute: 0 10*3/uL (ref 0.0–0.1)
Basophils Relative: 0 %
Eosinophils Absolute: 0 10*3/uL (ref 0.0–0.5)
Eosinophils Relative: 0 %
HCT: 46.4 % (ref 39.0–52.0)
Hemoglobin: 15.7 g/dL (ref 13.0–17.0)
Immature Granulocytes: 1 %
Lymphocytes Relative: 4 %
Lymphs Abs: 0.5 10*3/uL — ABNORMAL LOW (ref 0.7–4.0)
MCH: 31 pg (ref 26.0–34.0)
MCHC: 33.8 g/dL (ref 30.0–36.0)
MCV: 91.5 fL (ref 80.0–100.0)
Monocytes Absolute: 0.4 10*3/uL (ref 0.1–1.0)
Monocytes Relative: 3 %
Neutro Abs: 11.5 10*3/uL — ABNORMAL HIGH (ref 1.7–7.7)
Neutrophils Relative %: 92 %
Platelets: 201 10*3/uL (ref 150–400)
RBC: 5.07 MIL/uL (ref 4.22–5.81)
RDW: 13.2 % (ref 11.5–15.5)
WBC: 12.4 10*3/uL — ABNORMAL HIGH (ref 4.0–10.5)
nRBC: 0 % (ref 0.0–0.2)

## 2019-04-08 LAB — COMPREHENSIVE METABOLIC PANEL
ALT: 63 U/L — ABNORMAL HIGH (ref 0–44)
AST: 76 U/L — ABNORMAL HIGH (ref 15–41)
Albumin: 3.2 g/dL — ABNORMAL LOW (ref 3.5–5.0)
Alkaline Phosphatase: 103 U/L (ref 38–126)
Anion gap: 15 (ref 5–15)
BUN: 32 mg/dL — ABNORMAL HIGH (ref 8–23)
CO2: 23 mmol/L (ref 22–32)
Calcium: 8.5 mg/dL — ABNORMAL LOW (ref 8.9–10.3)
Chloride: 99 mmol/L (ref 98–111)
Creatinine, Ser: 1.46 mg/dL — ABNORMAL HIGH (ref 0.61–1.24)
GFR calc Af Amer: 58 mL/min — ABNORMAL LOW (ref >60)
GFR calc non Af Amer: 50 mL/min — ABNORMAL LOW (ref >60)
Glucose, Bld: 230 mg/dL — ABNORMAL HIGH (ref 70–99)
Potassium: 3.7 mmol/L (ref 3.5–5.1)
Sodium: 137 mmol/L (ref 135–145)
Total Bilirubin: 0.6 mg/dL (ref 0.3–1.2)
Total Protein: 7.9 g/dL (ref 6.5–8.1)

## 2019-04-08 LAB — LACTIC ACID, PLASMA: Lactic Acid, Venous: 2.3 mmol/L (ref 0.5–1.9)

## 2019-04-08 MED ORDER — SODIUM CHLORIDE 0.9 % IV BOLUS
500.0000 mL | Freq: Once | INTRAVENOUS | Status: AC
  Administered 2019-04-09: 500 mL via INTRAVENOUS

## 2019-04-08 MED ORDER — SODIUM CHLORIDE 0.9 % IV SOLN
500.0000 mg | Freq: Once | INTRAVENOUS | Status: AC
  Administered 2019-04-09: 500 mg via INTRAVENOUS
  Filled 2019-04-08: qty 500

## 2019-04-08 MED ORDER — SODIUM CHLORIDE 0.9 % IV SOLN
1.0000 g | Freq: Once | INTRAVENOUS | Status: AC
  Administered 2019-04-09: 1 g via INTRAVENOUS
  Filled 2019-04-08: qty 10

## 2019-04-08 NOTE — ED Provider Notes (Addendum)
[facility] EMERGENCY DEPARTMENT Provider Note   CSN: [id number] Arrival date & time: 04/08/19  2201     History   Chief Complaint Chief Complaint  Patient presents with  . Shortness of Breath    HPI [name] is a 63 y.o. male.     The history is provided by the patient. No language interpreter was used.  Shortness of Breath Severity:  Severe Onset quality:  Gradual Duration:  4 days Timing:  Constant Progression:  Worsening Chronicity:  New Context: activity and URI   Relieved by:  Nothing Worsened by:  Movement and activity Ineffective treatments:  Inhaler (prednisone) Associated symptoms: cough, fever, headaches and sputum production   Associated symptoms: no abdominal pain, no chest pain, no diaphoresis, no neck pain, no rash, no sore throat, no syncope and no vomiting   Risk factors: obesity    [name] was evaluated in Emergency Department on 04/09/2019 for the symptoms described in the history of present illness. He was evaluated in the context of the global COVID-19 pandemic, which necessitated consideration that the patient might be at risk for infection with the SARS-CoV-2 virus that causes COVID-19. Institutional protocols and algorithms that pertain to the evaluation of patients at risk for COVID-19 are in a state of rapid change based on information released by regulatory bodies including the CDC and federal and state organizations. These policies and algorithms were followed during the patient's care in the ED.  Past Medical History:  Diagnosis Date  . GERD (gastroesophageal reflux disease)   . 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 Hyperlipidemia   . Hypertension   . Liver disorder    tx with ursodiol     Patient Active Problem List   Diagnosis Date Noted  . Acute respiratory failure with hypoxia ([place]) 04/09/2019    Past Surgical History:  Procedure Laterality Date  . BELOW KNEE LEG AMPUTATION Right   .  CHOLECYSTECTOMY    . WISDOM TOOTH EXTRACTION          Home Medications    Prior to Admission medications   Medication Sig Start Date End Date Taking? Authorizing Provider  acetaminophen (TYLENOL) 500 MG tablet Take 500 mg by mouth every 6 (six) hours as needed for mild pain or fever.    Yes [provider]  aspirin 81 MG chewable tablet Chew 81 mg by mouth daily.   Yes [provider]  atorvastatin (LIPITOR) 20 MG tablet Take 2 tablets (40 mg total) by mouth at bedtime. 12/28/18  Yes Rodriguez-Southworth, Sunday Spillers, PA-C  famotidine (PEPCID) 20 MG tablet Take 20 mg by mouth 2 (two) times daily.   Yes [provider]  ursodiol (ACTIGALL) 250 MG tablet Take 250 mg by mouth 2 (two) times daily.   Yes [provider]  valsartan-hydrochlorothiazide (DIOVAN-HCT) 320-25 MG tablet TAKE 1 TABLET BY MOUTH DAILY. 03/28/19  Yes [name], FNP  doxycycline (VIBRA-TABS) 100 MG tablet Take 1 tablet (100 mg total) by mouth 2 (two) times daily. Patient not taking: Reported on 12/28/2018 03/30/18   Rodriguez-Southworth, Sunday Spillers, PA-C    Family History Family History  Problem Relation Age of Onset  . Colon polyps Father   . Colon cancer Neg Hx   . Rectal cancer Neg Hx   . Stomach cancer Neg Hx     Social History Social History   Tobacco Use  . Smoking status: Never Smoker  . Smokeless tobacco: Never Used  Substance Use Topics  . Alcohol use: No  . Drug use: No     Allergies   Patient has no known allergies.   Review of Systems Review of Systems  Constitutional: Positive for chills and fever. Negative for diaphoresis.  HENT: Negative for congestion, rhinorrhea and sore throat.        No loss of sense of taste or smell  Respiratory: Positive for cough, sputum production and shortness of breath.   Cardiovascular: Negative for chest pain, palpitations, leg swelling and syncope.  Gastrointestinal: Negative for abdominal pain, diarrhea, nausea and vomiting.   Genitourinary: Negative for dysuria.  Musculoskeletal: Positive for myalgias. Negative for neck pain.  Skin: Negative for rash.  Neurological: Positive for light-headedness and headaches. Negative for weakness and numbness.     Physical Exam Updated Vital Signs BP 120/90   Pulse 89   Temp 98.6 F (37 C)   Resp (!) 21   SpO2 (!) 88%   Physical Exam Vitals signs and nursing note reviewed.  Constitutional:      General: He is not in acute distress.    Appearance: He is well-developed. He is not ill-appearing, toxic-appearing or diaphoretic.  HENT:     Head: Normocephalic.  Eyes:     Conjunctiva/sclera: Conjunctivae normal.  Neck:     Musculoskeletal: Neck supple.  Cardiovascular:     Rate and Rhythm: Regular rhythm. Tachycardia present.     Heart sounds: No murmur.  Pulmonary:     Effort: Pulmonary effort is normal.     Comments: Lung sounds are clear bilaterally.  He is working hard to breathe and has air hunger and tachypnea.  No retractions.  No distress. Abdominal:     General: There is no distension.     Palpations: Abdomen is soft.  Musculoskeletal:     Right lower leg: No edema.     Left lower leg: No edema.  Skin:    General: Skin is warm and dry.  Neurological:     Mental Status: He is alert.  Psychiatric:        Behavior: Behavior normal.    ED Treatments / Results  Labs (all labs ordered are listed, but only abnormal results are displayed) Labs Reviewed  LACTIC ACID, PLASMA - Abnormal; Notable for the following components:      Result Value   Lactic Acid, Venous 2.3 (*)    All other components within normal limits  CBC WITH DIFFERENTIAL/PLATELET - Abnormal; Notable for the following components:   WBC 12.4 (*)    Neutro Abs 11.5 (*)    Lymphs Abs 0.5 (*)    All other components within normal limits  COMPREHENSIVE METABOLIC PANEL - Abnormal; Notable for the following components:   Glucose, Bld 230 (*)    BUN 32 (*)    Creatinine, Ser 1.46 (*)     Calcium 8.5 (*)    Albumin 3.2 (*)    AST 76 (*)    ALT 63 (*)    GFR calc non Af Amer 50 (*)    GFR calc Af Amer 58 (*)    All other components within normal limits  CULTURE, BLOOD (ROUTINE X 2)  CULTURE, BLOOD (ROUTINE X 2)  SARS CORONAVIRUS 2 (TAT 6-24 HRS)  LACTIC ACID, PLASMA    EKG EKG Interpretation  Date/Time:  Sunday April 08 2019 22:31:56 EST Ventricular Rate:  107 PR Interval:    QRS Duration: 83 QT Interval:  334 QTC Calculation: 446 R Axis:   -  66 Text Interpretation: Sinus tachycardia Atrial premature complexes Probable left atrial enlargement Left anterior fascicular block Probable anteroseptal infarct, old Confirmed by Gerlene Fee [phone]) on 04/08/2019 10:34:47 PM   Radiology Dg Chest Port 1 View  Result Date: 04/08/2019 CLINICAL DATA:  Shortness of breath, fever EXAM: PORTABLE CHEST 1 VIEW COMPARISON:  12/13/2005 FINDINGS: Low lung volumes. No focal airspace disease or effusion. Stable cardiomediastinal silhouette. No pneumothorax. IMPRESSION: No active disease. Electronically Signed   By: Donavan Foil M.D.   On: 04/08/2019 23:13    Procedures .Critical Care Performed by: Joanne Gavel, PA-C Authorized by: Joanne Gavel, PA-C   Critical care provider statement:    Critical care time (minutes):  40   Critical care time was exclusive of:  Separately billable procedures and treating other patients and teaching time   Critical care was necessary to treat or prevent imminent or life-threatening deterioration of the following conditions:  Respiratory failure and sepsis   Critical care was time spent personally by me on the following activities:  Ordering and performing treatments and interventions, ordering and review of laboratory studies, ordering and review of radiographic studies, pulse oximetry, re-evaluation of patient's condition, obtaining history from patient or surrogate, examination of patient, development of treatment plan with patient or  surrogate, evaluation of patient's response to treatment and review of old charts   I assumed direction of critical care for this patient from another provider in my specialty: no     (including critical care time)  Medications Ordered in ED Medications  cefTRIAXone (ROCEPHIN) 1 g in sodium chloride 0.9 % 100 mL IVPB (1 g Intravenous New Bag/Given 04/09/19 0036)  azithromycin (ZITHROMAX) 500 mg in sodium chloride 0.9 % 250 mL IVPB (500 mg Intravenous New Bag/Given 04/09/19 0037)  sodium chloride 0.9 % bolus 500 mL (500 mLs Intravenous New Bag/Given 04/09/19 0035)     Initial Impression / Assessment and Plan / ED Course  I have reviewed the triage vital signs and the nursing notes.  Pertinent labs & imaging results that were available during my care of the patient were reviewed by me and considered in my medical decision making (see chart for details).        63 year old male with a history of right BKA, hyperlipidemia, HTN, GERD presenting with fever and body aches that began 4 days ago.  T-max 101.4 at home, which he has been treating with antipyretics.  No fever today.  Reports that he developed shortness of breath 3 days ago that has been constant and worsening since onset.  He reports that his shortness of breath has significantly worsened over the last 24 hours and he is now getting very winded walking from room to room.  He is having aching in his chest with coughing, but otherwise no chest pain.  He is intermittently lightheaded and endorses a headache and productive cough with creamy yellow sputum.  No loss of sense of taste or smell, nausea, vomiting, diarrhea, abdominal pain, leg swelling, sore throat, rhinorrhea, or congestion.  He was given a prescription for a burst of prednisone, 50 mg daily, and has taken 2 doses thus far.  He has also been using an albuterol inhaler every 4 hours for the last 2 days.  No known exposures to COVID-19, but the patient does work in the [employer]. Denies  any known high risk exposures at work.   Patient is tachycardic in the 110s and hypoxic at 65% on room air on arrival to the ER.  He was placed on nasal cannula and oxygen saturation improved to the 80s.  He was then transition to a nonrebreather and sats remained in the mid 90s.  Respiratory seen the patient and he has been transitioned to high flow nasal cannula and is currently satting at 94%.  The patient was seen and independently evaluated by Dr. Vanita Panda attending physician.   EKG with sinus tachycardia, but no evidence of ischemia.  The patient is also not having chest pain so doubt ACS.  Chest x-ray is clear, but the patient has had a productive cough with thick creamy yellow sputum for the last few days in the setting of fever.  His lungs were clear to auscultation bilaterally on exam. he has a mild leukocytosis and lactate is minimally elevated at 2.3 so I will give him a dose of azithromycin and Rocephin to cover for CAP.  He meets sepsis criteria, but 30 cc/kg IV fluid bolus is not warranted as lactate is less than 4.  There is a strong suspicion for COVID-19, but test is pending at this time, especially in the setting of mildly elevated transaminases, which the patient has no history of to suggest a viral etiology.  He also has a mild AKI with a creatinine of 1.46 and has been treated with a 500 cc fluid bolus.   I have a low suspicion for PE or acute heart failure in the setting of URI symptoms as the etiology of his shortness of breath.  Given respiratory failure with acute hypoxia, the patient is critically ill and warrants admission.  Consulted the hospitalist team and spoke with Dr. Hal Hope who is excepted the patient for admission. The patient appears reasonably stabilized for admission considering the current resources, flow, and capabilities available in the ED at this time, and I doubt any other [facility] requiring further screening and/or treatment in the ED prior to admission.  Final  Clinical Impressions(s) / ED Diagnoses   Final diagnoses:  Sepsis with acute hypoxic respiratory failure without septic shock, due to unspecified organism [facility])    ED Discharge Orders    None       Joanne Gavel, PA-C 04/09/19 0054    Joanne Gavel, PA-C 04/09/19 0100    Carmin Muskrat, MD 04/10/19 [phone]

## 2019-04-08 NOTE — ED Triage Notes (Signed)
Patient with shortness of breath and cough since Thursday.  He has had fever with it, no fever today.  No sore throat, no loss of taste or smell.  No chest pain, but does have soreness from coughing.

## 2019-04-09 ENCOUNTER — Inpatient Hospital Stay (HOSPITAL_COMMUNITY): Payer: PRIVATE HEALTH INSURANCE

## 2019-04-09 ENCOUNTER — Encounter (HOSPITAL_COMMUNITY): Payer: Self-pay | Admitting: Internal Medicine

## 2019-04-09 DIAGNOSIS — K219 Gastro-esophageal reflux disease without esophagitis: Secondary | ICD-10-CM | POA: Diagnosis present

## 2019-04-09 DIAGNOSIS — N400 Enlarged prostate without lower urinary tract symptoms: Secondary | ICD-10-CM | POA: Diagnosis present

## 2019-04-09 DIAGNOSIS — R739 Hyperglycemia, unspecified: Secondary | ICD-10-CM

## 2019-04-09 DIAGNOSIS — U071 COVID-19: Secondary | ICD-10-CM | POA: Diagnosis present

## 2019-04-09 DIAGNOSIS — E669 Obesity, unspecified: Secondary | ICD-10-CM | POA: Diagnosis present

## 2019-04-09 DIAGNOSIS — N179 Acute kidney failure, unspecified: Secondary | ICD-10-CM

## 2019-04-09 DIAGNOSIS — E874 Mixed disorder of acid-base balance: Secondary | ICD-10-CM | POA: Diagnosis present

## 2019-04-09 DIAGNOSIS — I10 Essential (primary) hypertension: Secondary | ICD-10-CM | POA: Diagnosis not present

## 2019-04-09 DIAGNOSIS — K8020 Calculus of gallbladder without cholecystitis without obstruction: Secondary | ICD-10-CM | POA: Diagnosis present

## 2019-04-09 DIAGNOSIS — J181 Lobar pneumonia, unspecified organism: Secondary | ICD-10-CM | POA: Diagnosis not present

## 2019-04-09 DIAGNOSIS — N182 Chronic kidney disease, stage 2 (mild): Secondary | ICD-10-CM | POA: Diagnosis present

## 2019-04-09 DIAGNOSIS — K769 Liver disease, unspecified: Secondary | ICD-10-CM | POA: Diagnosis present

## 2019-04-09 DIAGNOSIS — R6520 Severe sepsis without septic shock: Secondary | ICD-10-CM

## 2019-04-09 DIAGNOSIS — J9601 Acute respiratory failure with hypoxia: Secondary | ICD-10-CM | POA: Diagnosis present

## 2019-04-09 DIAGNOSIS — R040 Epistaxis: Secondary | ICD-10-CM | POA: Diagnosis not present

## 2019-04-09 DIAGNOSIS — E785 Hyperlipidemia, unspecified: Secondary | ICD-10-CM | POA: Diagnosis present

## 2019-04-09 DIAGNOSIS — E1165 Type 2 diabetes mellitus with hyperglycemia: Secondary | ICD-10-CM | POA: Diagnosis not present

## 2019-04-09 DIAGNOSIS — I2699 Other pulmonary embolism without acute cor pulmonale: Secondary | ICD-10-CM | POA: Diagnosis not present

## 2019-04-09 DIAGNOSIS — I129 Hypertensive chronic kidney disease with stage 1 through stage 4 chronic kidney disease, or unspecified chronic kidney disease: Secondary | ICD-10-CM | POA: Diagnosis present

## 2019-04-09 DIAGNOSIS — R7401 Elevation of levels of liver transaminase levels: Secondary | ICD-10-CM

## 2019-04-09 DIAGNOSIS — J1289 Other viral pneumonia: Secondary | ICD-10-CM | POA: Diagnosis present

## 2019-04-09 DIAGNOSIS — J189 Pneumonia, unspecified organism: Secondary | ICD-10-CM | POA: Diagnosis not present

## 2019-04-09 DIAGNOSIS — I2693 Single subsegmental pulmonary embolism without acute cor pulmonale: Secondary | ICD-10-CM | POA: Diagnosis not present

## 2019-04-09 DIAGNOSIS — I82452 Acute embolism and thrombosis of left peroneal vein: Secondary | ICD-10-CM | POA: Diagnosis not present

## 2019-04-09 DIAGNOSIS — I63512 Cerebral infarction due to unspecified occlusion or stenosis of left middle cerebral artery: Secondary | ICD-10-CM | POA: Diagnosis not present

## 2019-04-09 DIAGNOSIS — J1282 Pneumonia due to coronavirus disease 2019: Secondary | ICD-10-CM | POA: Diagnosis present

## 2019-04-09 DIAGNOSIS — I82409 Acute embolism and thrombosis of unspecified deep veins of unspecified lower extremity: Secondary | ICD-10-CM | POA: Diagnosis not present

## 2019-04-09 DIAGNOSIS — Z6837 Body mass index (BMI) 37.0-37.9, adult: Secondary | ICD-10-CM | POA: Diagnosis not present

## 2019-04-09 DIAGNOSIS — D6869 Other thrombophilia: Secondary | ICD-10-CM | POA: Diagnosis present

## 2019-04-09 DIAGNOSIS — M7989 Other specified soft tissue disorders: Secondary | ICD-10-CM | POA: Diagnosis not present

## 2019-04-09 DIAGNOSIS — A419 Sepsis, unspecified organism: Secondary | ICD-10-CM

## 2019-04-09 DIAGNOSIS — M109 Gout, unspecified: Secondary | ICD-10-CM | POA: Diagnosis not present

## 2019-04-09 DIAGNOSIS — I2694 Multiple subsegmental pulmonary emboli without acute cor pulmonale: Secondary | ICD-10-CM | POA: Diagnosis present

## 2019-04-09 DIAGNOSIS — R482 Apraxia: Secondary | ICD-10-CM | POA: Diagnosis not present

## 2019-04-09 DIAGNOSIS — I82442 Acute embolism and thrombosis of left tibial vein: Secondary | ICD-10-CM | POA: Diagnosis present

## 2019-04-09 DIAGNOSIS — I639 Cerebral infarction, unspecified: Secondary | ICD-10-CM | POA: Diagnosis not present

## 2019-04-09 HISTORY — DX: Acute kidney failure, unspecified: N17.9

## 2019-04-09 HISTORY — DX: Elevation of levels of liver transaminase levels: R74.01

## 2019-04-09 HISTORY — DX: Sepsis, unspecified organism: A41.9

## 2019-04-09 HISTORY — DX: Hyperglycemia, unspecified: R73.9

## 2019-04-09 HISTORY — DX: Pneumonia due to coronavirus disease 2019: J12.82

## 2019-04-09 HISTORY — DX: Sepsis, unspecified organism: R65.20

## 2019-04-09 HISTORY — DX: COVID-19: U07.1

## 2019-04-09 HISTORY — DX: Pneumonia, unspecified organism: J18.9

## 2019-04-09 LAB — COMPREHENSIVE METABOLIC PANEL
ALT: 58 U/L — ABNORMAL HIGH (ref 0–44)
AST: 67 U/L — ABNORMAL HIGH (ref 15–41)
Albumin: 2.9 g/dL — ABNORMAL LOW (ref 3.5–5.0)
Alkaline Phosphatase: 89 U/L (ref 38–126)
Anion gap: 15 (ref 5–15)
BUN: 33 mg/dL — ABNORMAL HIGH (ref 8–23)
CO2: 23 mmol/L (ref 22–32)
Calcium: 7.9 mg/dL — ABNORMAL LOW (ref 8.9–10.3)
Chloride: 101 mmol/L (ref 98–111)
Creatinine, Ser: 1.39 mg/dL — ABNORMAL HIGH (ref 0.61–1.24)
GFR calc Af Amer: >60 mL/min (ref >60)
GFR calc non Af Amer: 54 mL/min — ABNORMAL LOW (ref >60)
Glucose, Bld: 175 mg/dL — ABNORMAL HIGH (ref 70–99)
Potassium: 3.9 mmol/L (ref 3.5–5.1)
Sodium: 139 mmol/L (ref 135–145)
Total Bilirubin: 0.5 mg/dL (ref 0.3–1.2)
Total Protein: 7.1 g/dL (ref 6.5–8.1)

## 2019-04-09 LAB — CBC WITH DIFFERENTIAL/PLATELET
Abs Immature Granulocytes: 0.06 10*3/uL (ref 0.00–0.07)
Basophils Absolute: 0 10*3/uL (ref 0.0–0.1)
Basophils Relative: 0 %
Eosinophils Absolute: 0 10*3/uL (ref 0.0–0.5)
Eosinophils Relative: 0 %
HCT: 43.4 % (ref 39.0–52.0)
Hemoglobin: 14.8 g/dL (ref 13.0–17.0)
Immature Granulocytes: 0 %
Lymphocytes Relative: 6 %
Lymphs Abs: 0.8 10*3/uL (ref 0.7–4.0)
MCH: 31.6 pg (ref 26.0–34.0)
MCHC: 34.1 g/dL (ref 30.0–36.0)
MCV: 92.7 fL (ref 80.0–100.0)
Monocytes Absolute: 0.6 10*3/uL (ref 0.1–1.0)
Monocytes Relative: 4 %
Neutro Abs: 12.4 10*3/uL — ABNORMAL HIGH (ref 1.7–7.7)
Neutrophils Relative %: 90 %
Platelets: 204 10*3/uL (ref 150–400)
RBC: 4.68 MIL/uL (ref 4.22–5.81)
RDW: 13.4 % (ref 11.5–15.5)
WBC: 13.9 10*3/uL — ABNORMAL HIGH (ref 4.0–10.5)
nRBC: 0 % (ref 0.0–0.2)

## 2019-04-09 LAB — LACTIC ACID, PLASMA
Lactic Acid, Venous: 2.1 mmol/L (ref 0.5–1.9)
Lactic Acid, Venous: 2.4 mmol/L (ref 0.5–1.9)
Lactic Acid, Venous: 2.1 mmol/L (ref 0.5–1.9)

## 2019-04-09 LAB — CBG MONITORING, ED
Glucose-Capillary: 192 mg/dL — ABNORMAL HIGH (ref 70–99)
Glucose-Capillary: 149 mg/dL — ABNORMAL HIGH (ref 70–99)

## 2019-04-09 LAB — HEMOGLOBIN A1C
Hgb A1c MFr Bld: 7.1 % — ABNORMAL HIGH (ref 4.8–5.6)
Mean Plasma Glucose: 157.07 mg/dL

## 2019-04-09 LAB — PROCALCITONIN: Procalcitonin: 0.15 ng/mL

## 2019-04-09 LAB — FERRITIN: Ferritin: 765 ng/mL — ABNORMAL HIGH (ref 24–336)

## 2019-04-09 LAB — D-DIMER, QUANTITATIVE: D-Dimer, Quant: 1.91 ug/mL-FEU — ABNORMAL HIGH (ref 0.00–0.50)

## 2019-04-09 LAB — ABO/RH: ABO/RH(D): O POS

## 2019-04-09 LAB — C-REACTIVE PROTEIN: CRP: 15.5 mg/dL — ABNORMAL HIGH (ref <1.0)

## 2019-04-09 LAB — TROPONIN I (HIGH SENSITIVITY): Troponin I (High Sensitivity): 41 ng/L — ABNORMAL HIGH (ref <18)

## 2019-04-09 LAB — BRAIN NATRIURETIC PEPTIDE: B Natriuretic Peptide: 77.8 pg/mL (ref 0.0–100.0)

## 2019-04-09 LAB — SARS CORONAVIRUS 2 (TAT 6-24 HRS): SARS Coronavirus 2: POSITIVE — AB

## 2019-04-09 MED ORDER — URSODIOL 250 MG PO TABS: 250.0000 mg | ORAL_TABLET | Freq: Two times a day (BID) | ORAL | Status: DC

## 2019-04-09 MED ORDER — SODIUM CHLORIDE 0.9 % IV SOLN
2.0000 g | INTRAVENOUS | Status: DC
  Administered 2019-04-09: 2 g via INTRAVENOUS
  Filled 2019-04-09: qty 20

## 2019-04-09 MED ORDER — ONDANSETRON HCL 4 MG PO TABS: 4.0000 mg | ORAL_TABLET | Freq: Four times a day (QID) | ORAL | Status: DC | PRN

## 2019-04-09 MED ORDER — HYDRALAZINE HCL 20 MG/ML IJ SOLN
10.0000 mg | INTRAMUSCULAR | Status: DC | PRN
  Filled 2019-04-09: qty 0.5

## 2019-04-09 MED ORDER — ACETAMINOPHEN 325 MG PO TABS
650.0000 mg | ORAL_TABLET | Freq: Four times a day (QID) | ORAL | Status: DC | PRN
  Administered 2019-04-26 – 2019-05-06 (×2): 650 mg via ORAL
  Filled 2019-04-09 (×2): qty 2

## 2019-04-09 MED ORDER — SODIUM CHLORIDE 0.9 % IV SOLN
500.0000 mg | INTRAVENOUS | Status: DC
  Administered 2019-04-09: 22:00:00 500 mg via INTRAVENOUS
  Filled 2019-04-09: qty 500

## 2019-04-09 MED ORDER — ONDANSETRON HCL 4 MG/2ML IJ SOLN: 4.0000 mg | Freq: Four times a day (QID) | INTRAMUSCULAR | Status: DC | PRN

## 2019-04-09 MED ORDER — ENOXAPARIN SODIUM 60 MG/0.6ML ~~LOC~~ SOLN: 50.0000 mg | SUBCUTANEOUS | Status: DC

## 2019-04-09 MED ORDER — SODIUM CHLORIDE 0.9 % IV SOLN
100.0000 mg | INTRAVENOUS | Status: AC
  Administered 2019-04-10 – 2019-04-13 (×4): 100 mg via INTRAVENOUS
  Filled 2019-04-09 (×4): qty 100

## 2019-04-09 MED ORDER — ASPIRIN 81 MG PO CHEW
81.0000 mg | CHEWABLE_TABLET | Freq: Every day | ORAL | Status: DC
  Administered 2019-04-09 – 2019-05-07 (×29): 81 mg via ORAL
  Filled 2019-04-09 (×30): qty 1

## 2019-04-09 MED ORDER — SODIUM CHLORIDE 0.9 % IV SOLN
200.0000 mg | Freq: Once | INTRAVENOUS | Status: AC
  Administered 2019-04-09: 15:00:00 200 mg via INTRAVENOUS
  Filled 2019-04-09: qty 40

## 2019-04-09 MED ORDER — IOHEXOL 350 MG/ML SOLN
100.0000 mL | Freq: Once | INTRAVENOUS | Status: AC | PRN
  Administered 2019-04-09: 100 mL via INTRAVENOUS

## 2019-04-09 MED ORDER — ACETAMINOPHEN 650 MG RE SUPP: 650.0000 mg | Freq: Four times a day (QID) | RECTAL | Status: DC | PRN

## 2019-04-09 MED ORDER — ENOXAPARIN SODIUM 40 MG/0.4ML ~~LOC~~ SOLN
40.0000 mg | SUBCUTANEOUS | Status: DC
  Administered 2019-04-09: 40 mg via SUBCUTANEOUS
  Filled 2019-04-09: qty 0.4

## 2019-04-09 MED ORDER — DEXAMETHASONE SODIUM PHOSPHATE 10 MG/ML IJ SOLN
6.0000 mg | Freq: Every day | INTRAMUSCULAR | Status: DC
  Administered 2019-04-09 – 2019-04-10 (×2): 6 mg via INTRAVENOUS
  Filled 2019-04-09 (×2): qty 1

## 2019-04-09 MED ORDER — ENOXAPARIN SODIUM 120 MG/0.8ML ~~LOC~~ SOLN
1.0000 mg/kg | SUBCUTANEOUS | Status: DC
  Filled 2019-04-09: qty 0.8

## 2019-04-09 MED ORDER — INSULIN ASPART 100 UNIT/ML ~~LOC~~ SOLN
0.0000 [IU] | Freq: Three times a day (TID) | SUBCUTANEOUS | Status: DC
  Administered 2019-04-09: 1 [IU] via SUBCUTANEOUS
  Administered 2019-04-09 – 2019-04-10 (×2): 2 [IU] via SUBCUTANEOUS
  Administered 2019-04-10: 3 [IU] via SUBCUTANEOUS
  Administered 2019-04-10: 2 [IU] via SUBCUTANEOUS
  Administered 2019-04-11: 3 [IU] via SUBCUTANEOUS
  Administered 2019-04-11 – 2019-04-12 (×4): 2 [IU] via SUBCUTANEOUS
  Administered 2019-04-12: 1 [IU] via SUBCUTANEOUS
  Administered 2019-04-13 – 2019-04-14 (×3): 2 [IU] via SUBCUTANEOUS
  Administered 2019-04-14: 3 [IU] via SUBCUTANEOUS
  Administered 2019-04-14: 2 [IU] via SUBCUTANEOUS
  Administered 2019-04-15: 17:00:00 3 [IU] via SUBCUTANEOUS
  Administered 2019-04-15 (×2): 2 [IU] via SUBCUTANEOUS
  Administered 2019-04-16: 1 [IU] via SUBCUTANEOUS
  Administered 2019-04-16: 3 [IU] via SUBCUTANEOUS
  Administered 2019-04-16 – 2019-04-17 (×2): 2 [IU] via SUBCUTANEOUS
  Administered 2019-04-17: 1 [IU] via SUBCUTANEOUS
  Administered 2019-04-17: 2 [IU] via SUBCUTANEOUS
  Administered 2019-04-18: 1 [IU] via SUBCUTANEOUS
  Administered 2019-04-18: 5 [IU] via SUBCUTANEOUS
  Administered 2019-04-19: 3 [IU] via SUBCUTANEOUS
  Administered 2019-04-19 – 2019-04-20 (×3): 2 [IU] via SUBCUTANEOUS
  Administered 2019-04-21: 1 [IU] via SUBCUTANEOUS
  Administered 2019-04-21: 2 [IU] via SUBCUTANEOUS
  Administered 2019-04-22: 5 [IU] via SUBCUTANEOUS
  Administered 2019-04-22: 2 [IU] via SUBCUTANEOUS
  Administered 2019-04-23: 1 [IU] via SUBCUTANEOUS
  Administered 2019-04-23: 2 [IU] via SUBCUTANEOUS
  Administered 2019-04-23: 5 [IU] via SUBCUTANEOUS
  Administered 2019-04-24: 2 [IU] via SUBCUTANEOUS
  Administered 2019-04-24 – 2019-04-27 (×8): 1 [IU] via SUBCUTANEOUS
  Administered 2019-04-27: 3 [IU] via SUBCUTANEOUS
  Administered 2019-04-28: 2 [IU] via SUBCUTANEOUS
  Administered 2019-04-28: 1 [IU] via SUBCUTANEOUS
  Administered 2019-04-28 – 2019-04-29 (×3): 2 [IU] via SUBCUTANEOUS
  Administered 2019-04-30: 3 [IU] via SUBCUTANEOUS
  Administered 2019-04-30: 2 [IU] via SUBCUTANEOUS
  Administered 2019-05-01: 3 [IU] via SUBCUTANEOUS
  Administered 2019-05-01: 1 [IU] via SUBCUTANEOUS
  Administered 2019-05-01: 5 [IU] via SUBCUTANEOUS
  Administered 2019-05-02 (×2): 2 [IU] via SUBCUTANEOUS
  Administered 2019-05-03 (×2): 1 [IU] via SUBCUTANEOUS
  Administered 2019-05-04: 3 [IU] via SUBCUTANEOUS
  Administered 2019-05-05: 1 [IU] via SUBCUTANEOUS
  Administered 2019-05-05: 2 [IU] via SUBCUTANEOUS
  Administered 2019-05-06: 5 [IU] via SUBCUTANEOUS
  Administered 2019-05-06 – 2019-05-07 (×3): 1 [IU] via SUBCUTANEOUS

## 2019-04-09 MED ORDER — ATORVASTATIN CALCIUM 40 MG PO TABS
40.0000 mg | ORAL_TABLET | Freq: Every day | ORAL | Status: DC
  Administered 2019-04-09 – 2019-05-07 (×29): 40 mg via ORAL
  Filled 2019-04-09 (×29): qty 1

## 2019-04-09 MED ORDER — LORAZEPAM 2 MG/ML IJ SOLN
0.5000 mg | Freq: Once | INTRAMUSCULAR | Status: DC
  Filled 2019-04-09 (×3): qty 1

## 2019-04-09 MED ORDER — FAMOTIDINE 20 MG PO TABS
20.0000 mg | ORAL_TABLET | Freq: Two times a day (BID) | ORAL | Status: DC
  Administered 2019-04-09 – 2019-05-07 (×57): 20 mg via ORAL
  Filled 2019-04-09 (×59): qty 1

## 2019-04-09 MED ORDER — URSODIOL 300 MG PO CAPS
300.0000 mg | ORAL_CAPSULE | Freq: Two times a day (BID) | ORAL | Status: DC
  Administered 2019-04-09 – 2019-05-07 (×57): 300 mg via ORAL
  Filled 2019-04-09 (×63): qty 1

## 2019-04-09 NOTE — ED Notes (Signed)
Pt received breakfast tray 

## 2019-04-09 NOTE — ED Notes (Signed)
Breakfast ordered for patient  

## 2019-04-09 NOTE — H&P (Signed)
History and Physical    [name] [id number] DOB: 1956/04/16 DOA: 04/08/2019  PCP: [name], FNP  Patient coming from: Home.  Chief Complaint: Shortness of breath.  HPI: [name] is a 63 y.o. male with history of hypertension, hyperlipidemia, right BKA liver disorder which patient states is due to gallstones on ursodiol presents to the ER because of increasing shortness of breath.  Patient has been having the symptoms for 4 days.  Patient symptoms started with shortness of breath productive cough fever chills had gone to urgent care and was prescribed prednisone 50 mg daily with antibiotics and was advised to get COVID-19 test done.  Patient had COVID-19 test done as outpatient results of which are still pending.  Given the worsening shortness of breath patient presents to the ER.  Denies any nausea vomiting diarrhea chest pain.  ED Course: In the ER patient was tachycardic with chest x-ray showing nothing acute lab work show leukocytosis of 12.4 lactic acid 2.3 creatinine 1.4 increased from baseline.  Glucose 230 COVID-19 test is pending the blood cultures were obtained.  Started on empiric antibiotics for acute respiratory failure with hypoxia.  Patient is requiring high flow oxygen to maintain sats.  EKG shows sinus tachycardia.  Was given fluid bolus in the ER for sepsis-like picture.  Review of Systems: As per HPI, rest all negative.   Past Medical History:  Diagnosis Date  . GERD (gastroesophageal reflux disease)   . 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 Hyperlipidemia   . Hypertension   . Liver disorder    tx with ursodiol     Past Surgical History:  Procedure Laterality Date  . BELOW KNEE LEG AMPUTATION Right   . CHOLECYSTECTOMY    . WISDOM TOOTH EXTRACTION       reports that he has never smoked. He has never used smokeless tobacco. He reports that he does not drink alcohol or use drugs.  No Known Allergies  Family History  Problem  Relation Age of Onset  . Colon polyps Father   . Colon cancer Neg Hx   . Rectal cancer Neg Hx   . Stomach cancer Neg Hx     Prior to Admission medications   Medication Sig Start Date End Date Taking? Authorizing Provider  acetaminophen (TYLENOL) 500 MG tablet Take 500 mg by mouth every 6 (six) hours as needed for mild pain or fever.    Yes [provider]  aspirin 81 MG chewable tablet Chew 81 mg by mouth daily.   Yes [provider]  atorvastatin (LIPITOR) 20 MG tablet Take 2 tablets (40 mg total) by mouth at bedtime. 12/28/18  Yes Rodriguez-Southworth, Sunday Spillers, PA-C  famotidine (PEPCID) 20 MG tablet Take 20 mg by mouth 2 (two) times daily.   Yes [provider]  ursodiol (ACTIGALL) 250 MG tablet Take 250 mg by mouth 2 (two) times daily.   Yes [provider]  valsartan-hydrochlorothiazide (DIOVAN-HCT) 320-25 MG tablet TAKE 1 TABLET BY MOUTH DAILY. 03/28/19  Yes [name], FNP  doxycycline (VIBRA-TABS) 100 MG tablet Take 1 tablet (100 mg total) by mouth 2 (two) times daily. Patient not taking: Reported on 12/28/2018 03/30/18   Rodriguez-Southworth, Sunday Spillers, PA-C    Physical Exam: Constitutional: Moderately built and nourished. Vitals:   04/09/19 0015 04/09/19 0030 04/09/19 0216 04/09/19 0300  BP: 115/77 120/90 126/80 124/85  Pulse: 98 89 85 74  Resp: 10 (!) 21 18 13   Temp:  SpO2: 92% (!) 88% 96% 94%   Eyes: Anicteric no pallor. ENMT: No discharge from the ears eyes nose or mouth. Neck: No mass felt.  No neck rigidity. Respiratory: No rhonchi or crepitations. Cardiovascular: S1-S2 heard. Abdomen: Soft nontender bowel sounds present. Musculoskeletal: No edema. Skin: No rash. Neurologic: Alert awake oriented to time place and person.  Moves all extremities. Psychiatric: Appears normal per normal affect.   Labs on Admission: I have personally reviewed following labs and imaging studies  CBC: Recent Labs  Lab 04/08/19 2226  WBC 12.4*   NEUTROABS 11.5*  HGB 15.7  HCT 46.4  MCV 91.5  PLT 123456   Basic Metabolic Panel: Recent Labs  Lab 04/08/19 2226  NA 137  K 3.7  CL 99  CO2 23  GLUCOSE 230*  BUN 32*  CREATININE 1.46*  CALCIUM 8.5*   GFR: CrCl cannot be calculated (Unknown ideal weight.). Liver Function Tests: Recent Labs  Lab 04/08/19 2226  AST 76*  ALT 63*  ALKPHOS 103  BILITOT 0.6  PROT 7.9  ALBUMIN 3.2*   No results for input(s): LIPASE, AMYLASE in the last 168 hours. No results for input(s): AMMONIA in the last 168 hours. Coagulation Profile: No results for input(s): INR, PROTIME in the last 168 hours. Cardiac Enzymes: No results for input(s): CKTOTAL, CKMB, CKMBINDEX, TROPONINI in the last 168 hours. BNP (last 3 results) No results for input(s): PROBNP in the last 8760 hours. HbA1C: No results for input(s): HGBA1C in the last 72 hours. CBG: No results for input(s): GLUCAP in the last 168 hours. Lipid Profile: No results for input(s): CHOL, HDL, LDLCALC, TRIG, CHOLHDL, LDLDIRECT in the last 72 hours. Thyroid Function Tests: No results for input(s): TSH, T4TOTAL, FREET4, T3FREE, THYROIDAB in the last 72 hours. Anemia Panel: No results for input(s): VITAMINB12, FOLATE, FERRITIN, TIBC, IRON, RETICCTPCT in the last 72 hours. Urine analysis:    Component Value Date/Time   BILIRUBINUR negative 12/28/2018 1717   PROTEINUR Negative 12/28/2018 1717   UROBILINOGEN 0.2 12/28/2018 1717   NITRITE negative 12/28/2018 1717   LEUKOCYTESUR Negative 12/28/2018 1717   Sepsis Labs: @LABRCNTIP (procalcitonin:4,lacticidven:4) )No results found for this or any previous visit (from the past 240 hour(s)).   Radiological Exams on Admission: Dg Chest Port 1 View  Result Date: 04/08/2019 CLINICAL DATA:  Shortness of breath, fever EXAM: PORTABLE CHEST 1 VIEW COMPARISON:  12/13/2005 FINDINGS: Low lung volumes. No focal airspace disease or effusion. Stable cardiomediastinal silhouette. No pneumothorax.  IMPRESSION: No active disease. Electronically Signed   By: Donavan Foil M.D.   On: 04/08/2019 23:13    EKG: Independently reviewed.  Sinus tachycardia.  Assessment/Plan Active Problems:   Acute respiratory failure with hypoxia (HCC)   Essential hypertension   HLD (hyperlipidemia)   Community acquired pneumonia    1. Acute respiratory failure with hypoxia with sepsis picture primary concerning for COVID-19 pneumonia results of which are pending.  Patient also had an outpatient test results of which we will have access to.  And per patient is still pending.  For now patient is on empiric antibiotics.  Will check inflammatory markers.  Since patient has been already started on steroids and patient is hypoxic we will keep patient on Decadron.  If COVID-19 test does come positive will start patient on remdesivir. 2. Acute on chronic and disease stage II could be from dehydration.  UA is pending.  Will hold off ARB and hydrochlorothiazide until we get a repeat metabolic panel to make sure is not worsening.  Did receive some fluids in the ER.  Hold off further fluids given the possibility of COVID-19. 3. Hypertension for now we will keep patient on as needed IV hydralazine until we make sure creatinine is not worsening.  Holding ARB and hydrochlorothiazide for the same reason. 4. Hyperglycemia likely from recent use of steroids.  Check hemoglobin A1c. 5. Hyperlipidemia on statins. 6. History of liver disorder/gallstones on ursodiol.  Patient states he takes ursodiol for gallstone.  Given that patient has acute respiratory failure requiring high flow oxygen to maintain sats will need inpatient status and more than 2 midnight stay.  If there is any further deterioration will consult pulmonary critical care.   DVT prophylaxis: Lovenox. Code Status: Full code. Family Communication: Discussed with patient. Disposition Plan: Home. Consults called: None. Admission status: Inpatient.   Rise Patience MD [facility] Pager [phone].  If 7PM-7AM, please contact night-coverage [email or web address] Password [facility]  04/09/2019, 3:57 AM

## 2019-04-09 NOTE — Progress Notes (Signed)
[facility]  PROGRESS NOTE  [name] [id number] DOB: 1955-07-19 DOA: 04/08/2019 PCP: [name], FNP Admit date - 04/08/2019   Admitting Physician No admitting provider for patient encounter.  Outpatient Primary MD for the patient is [name], FNP  LOS - 0 Brief Narrative   [name] is a 63 y.o. year old male with medical history significant for HTN, HLD, right BKA, gallstones on ursodiol who presented on 04/08/2019 with 4 days of worsening shortness of breath, cough, fevers and chills.  Patient was seen in urgent care for symptoms and prescribed prednisone 50 mg with antibiotics while awaiting results of outpatient COVID-19 test.  Patient presents to ED due to worsening shortness of breath.  In the ED he was tachypneic, tachycardic with oxygen saturations 65% on room air requiring high flow nasal cannula  Hospital course complicated by persistently high oxygen requirements  Subjective  Mr. [name] today feels his breathing has improved with treatments in the ED.  Still has mild cough.  Denies any subjective fevers or chills.  No abdominal pain.  No diarrhea.   A & P    Acute hypoxic respiratory failure, presumed secondary to COVID-19 infection.  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  Given elevated D-dimer and no overt changes on chest x-ray will obtain CTA chest to rule out PE.  In meantime start remdesivir, continue IV Decadron, continue empiric antibiotics, monitor cultures, serial inflammatory markers.    Severe sepsis, likely secondary to Covid-19.  Presenting with tachypnea, tachycardia, leukocytosis, and mild lactic acidosis monitor blood cultures, trend lactic acid, empiric azithromycin, ceftriaxone, IV Decadron and remdesivir.  AKI on CKD stage II.  Likely prerenal  related to sepsis physiology.  Hold home Diovan.  Received fluids in ED, will closely monitor.  Transaminitis.  Bilirubin unremarkable.  Likely due to Covid-19 infection.  Continue trending  Hyperglycemia the setting of IV steroids.  Check A1c, monitor CBGs.  HTN.  Normotensive.  Hold home Diovan, in setting of sepsis with lactic acidosis  HLD, stable.  Continue Lipitor   History of liver disorder/gallstones on ursodiol.  Has transaminitis without cholestasis, no abdominal pain     Family Communication  : No family at bedside  Code Status : Full code  Disposition Plan  : Continue IV remdesivir, Decadron.  Awaiting CTA chest  Consults  : None  Procedures  : None  DVT Prophylaxis  :  Lovenox  Lab Results  Component Value Date   PLT 204 04/09/2019    Diet :  Diet Order            Diet Heart Room service appropriate? Yes; Fluid consistency: Thin  Diet effective now               Inpatient Medications Scheduled Meds: . aspirin  81 mg Oral Daily  . atorvastatin  40 mg Oral q1800  . dexamethasone (DECADRON) injection  6 mg Intravenous Q0600  . enoxaparin (LOVENOX) injection  40 mg Subcutaneous Q24H  . famotidine  20 mg Oral BID  . ursodiol  250 mg Oral BID   Continuous Infusions: . azithromycin    . cefTRIAXone (ROCEPHIN)  IV     PRN Meds:.acetaminophen **OR** acetaminophen, hydrALAZINE, ondansetron **OR** ondansetron (ZOFRAN) IV  Antibiotics  :   Anti-infectives (From admission, onward)  Start     Dose/Rate Route Frequency Ordered Stop   04/09/19 2200  cefTRIAXone (ROCEPHIN) 2 g in sodium chloride 0.9 % 100 mL IVPB     2 g 200 mL/hr over 30 Minutes Intravenous Every 24 hours 04/09/19 0400     04/09/19 2200  azithromycin (ZITHROMAX) 500 mg in sodium chloride 0.9 % 250 mL IVPB     500 mg 250 mL/hr over 60 Minutes Intravenous Every 24 hours 04/09/19 0400     04/09/19 0000  cefTRIAXone (ROCEPHIN) 1 g in sodium chloride 0.9 % 100 mL IVPB     1 g 200 mL/hr  over 30 Minutes Intravenous  Once 04/08/19 2353 04/09/19 0115   04/09/19 0000  azithromycin (ZITHROMAX) 500 mg in sodium chloride 0.9 % 250 mL IVPB     500 mg 250 mL/hr over 60 Minutes Intravenous  Once 04/08/19 2353 04/09/19 0132       Objective   Vitals:   04/09/19 0715 04/09/19 0719 04/09/19 0721 04/09/19 0729  BP: 114/73     Pulse: 75   69  Resp: (!) 26   16  Temp:      TempSrc:      SpO2: (!) 87% 91% 91% 92%  Weight:      Height:        SpO2: 92 % O2 Flow Rate (L/min): 30 L/min FiO2 (%): 70 %  Wt Readings from Last 3 Encounters:  04/09/19 104.3 kg  12/28/18 104.8 kg  03/30/18 105.9 kg     Intake/Output Summary (Last 24 hours) at 04/09/2019 0751 Last data filed at 04/09/2019 0318 Gross per 24 hour  Intake 1328.2 ml  Output -  Net 1328.2 ml    Physical Exam:  Awake Alert, Oriented X 3, Normal affect No new F.N deficits,  [place].AT, No JVD Symmetrical Chest wall movement, normal respiratory effort on high flow nasal cannula, no accessory muscle usage, diminished breath sounds throughout  RRR,No Gallops,Rubs or new Murmurs, Right BKA +ve B.Sounds, Abd Soft, No tenderness,  No rebound, guarding or rigidity. No rash, no bruise, no peripheral edema   I have personally reviewed the following:   Data Reviewed:  CBC Recent Labs  Lab 04/08/19 2226 04/09/19 0557  WBC 12.4* 13.9*  HGB 15.7 14.8  HCT 46.4 43.4  PLT 201 204  MCV 91.5 92.7  MCH 31.0 31.6  MCHC 33.8 34.1  RDW 13.2 13.4  LYMPHSABS 0.5* 0.8  MONOABS 0.4 0.6  EOSABS 0.0 0.0  BASOSABS 0.0 0.0    Chemistries  Recent Labs  Lab 04/08/19 2226 04/09/19 0445  NA 137 139  K 3.7 3.9  CL 99 101  CO2 23 23  GLUCOSE 230* 175*  BUN 32* 33*  CREATININE 1.46* 1.39*  CALCIUM 8.5* 7.9*  AST 76* 67*  ALT 63* 58*  ALKPHOS 103 89  BILITOT 0.6 0.5   ------------------------------------------------------------------------------------------------------------------ No results for input(s): CHOL,  HDL, LDLCALC, TRIG, CHOLHDL, LDLDIRECT in the last 72 hours.  Lab Results  Component Value Date   HGBA1C 7.1 (H) 04/09/2019   ------------------------------------------------------------------------------------------------------------------ No results for input(s): TSH, T4TOTAL, T3FREE, THYROIDAB in the last 72 hours.  Invalid input(s): FREET3 ------------------------------------------------------------------------------------------------------------------ Recent Labs    04/09/19 0445  FERRITIN 765*    Coagulation profile No results for input(s): INR, PROTIME in the last 168 hours.  Recent Labs    04/09/19 0445  DDIMER 1.91*    Cardiac Enzymes No results for input(s): CKMB, TROPONINI, MYOGLOBIN in the last 168 hours.  Invalid input(s):  CK ------------------------------------------------------------------------------------------------------------------    Component Value Date/Time   BNP 77.8 04/09/2019 0445    Micro Results No results found for this or any previous visit (from the past 240 hour(s)).  Radiology Reports Dg Chest Port 1 View  Result Date: 04/08/2019 CLINICAL DATA:  Shortness of breath, fever EXAM: PORTABLE CHEST 1 VIEW COMPARISON:  12/13/2005 FINDINGS: Low lung volumes. No focal airspace disease or effusion. Stable cardiomediastinal silhouette. No pneumothorax. IMPRESSION: No active disease. Electronically Signed   By: Donavan Foil M.D.   On: 04/08/2019 23:13     Time Spent in minutes  30     Desiree Hane M.D on 04/09/2019 at 7:51 AM  To page go to [email or web address] - password [facility]

## 2019-04-09 NOTE — ED Notes (Signed)
Called [facility] and [facility] for transport to [facility]

## 2019-04-10 ENCOUNTER — Inpatient Hospital Stay (HOSPITAL_COMMUNITY): Payer: PRIVATE HEALTH INSURANCE

## 2019-04-10 DIAGNOSIS — I2699 Other pulmonary embolism without acute cor pulmonale: Secondary | ICD-10-CM

## 2019-04-10 DIAGNOSIS — I2693 Single subsegmental pulmonary embolism without acute cor pulmonale: Secondary | ICD-10-CM

## 2019-04-10 DIAGNOSIS — E785 Hyperlipidemia, unspecified: Secondary | ICD-10-CM

## 2019-04-10 DIAGNOSIS — A419 Sepsis, unspecified organism: Secondary | ICD-10-CM

## 2019-04-10 DIAGNOSIS — M7989 Other specified soft tissue disorders: Secondary | ICD-10-CM

## 2019-04-10 DIAGNOSIS — N179 Acute kidney failure, unspecified: Secondary | ICD-10-CM

## 2019-04-10 DIAGNOSIS — N182 Chronic kidney disease, stage 2 (mild): Secondary | ICD-10-CM

## 2019-04-10 DIAGNOSIS — R6520 Severe sepsis without septic shock: Secondary | ICD-10-CM

## 2019-04-10 LAB — COMPREHENSIVE METABOLIC PANEL
ALT: 55 U/L — ABNORMAL HIGH (ref 0–44)
AST: 60 U/L — ABNORMAL HIGH (ref 15–41)
Albumin: 3.4 g/dL — ABNORMAL LOW (ref 3.5–5.0)
Alkaline Phosphatase: 112 U/L (ref 38–126)
Anion gap: 14 (ref 5–15)
BUN: 25 mg/dL — ABNORMAL HIGH (ref 8–23)
CO2: 28 mmol/L (ref 22–32)
Calcium: 8.4 mg/dL — ABNORMAL LOW (ref 8.9–10.3)
Chloride: 98 mmol/L (ref 98–111)
Creatinine, Ser: 1 mg/dL (ref 0.61–1.24)
GFR calc Af Amer: >60 mL/min (ref >60)
GFR calc non Af Amer: >60 mL/min (ref >60)
Glucose, Bld: 130 mg/dL — ABNORMAL HIGH (ref 70–99)
Potassium: 3.9 mmol/L (ref 3.5–5.1)
Sodium: 140 mmol/L (ref 135–145)
Total Bilirubin: 1 mg/dL (ref 0.3–1.2)
Total Protein: 7.8 g/dL (ref 6.5–8.1)

## 2019-04-10 LAB — ABO/RH: ABO/RH(D): O POS

## 2019-04-10 LAB — CBC
HCT: 45.7 % (ref 39.0–52.0)
Hemoglobin: 15.4 g/dL (ref 13.0–17.0)
MCH: 31.8 pg (ref 26.0–34.0)
MCHC: 33.7 g/dL (ref 30.0–36.0)
MCV: 94.2 fL (ref 80.0–100.0)
Platelets: 176 10*3/uL (ref 150–400)
RBC: 4.85 MIL/uL (ref 4.22–5.81)
RDW: 13.6 % (ref 11.5–15.5)
WBC: 14.4 10*3/uL — ABNORMAL HIGH (ref 4.0–10.5)
nRBC: 0 % (ref 0.0–0.2)

## 2019-04-10 LAB — C-REACTIVE PROTEIN: CRP: 14.7 mg/dL — ABNORMAL HIGH (ref <1.0)

## 2019-04-10 LAB — NOVEL CORONAVIRUS, NAA: SARS-CoV-2, NAA: DETECTED — AB

## 2019-04-10 LAB — GLUCOSE, CAPILLARY
Glucose-Capillary: 157 mg/dL — ABNORMAL HIGH (ref 70–99)
Glucose-Capillary: 193 mg/dL — ABNORMAL HIGH (ref 70–99)
Glucose-Capillary: 207 mg/dL — ABNORMAL HIGH (ref 70–99)
Glucose-Capillary: 192 mg/dL — ABNORMAL HIGH (ref 70–99)

## 2019-04-10 LAB — ECHOCARDIOGRAM COMPLETE
Height: 66 in
Weight: 3680 oz

## 2019-04-10 LAB — D-DIMER, QUANTITATIVE: D-Dimer, Quant: >20 ug/mL-FEU — ABNORMAL HIGH (ref 0.00–0.50)

## 2019-04-10 LAB — LACTATE DEHYDROGENASE: LDH: 952 U/L — ABNORMAL HIGH (ref 98–192)

## 2019-04-10 LAB — FERRITIN: Ferritin: 812 ng/mL — ABNORMAL HIGH (ref 24–336)

## 2019-04-10 LAB — PROCALCITONIN: Procalcitonin: 0.11 ng/mL

## 2019-04-10 LAB — SAMPLE TO BLOOD BANK

## 2019-04-10 LAB — BRAIN NATRIURETIC PEPTIDE: B Natriuretic Peptide: 43.8 pg/mL (ref 0.0–100.0)

## 2019-04-10 LAB — HIV ANTIBODY (ROUTINE TESTING W REFLEX): HIV Screen 4th Generation wRfx: NONREACTIVE — AB

## 2019-04-10 MED ORDER — IPRATROPIUM-ALBUTEROL 20-100 MCG/ACT IN AERS
1.0000 | INHALATION_SPRAY | Freq: Four times a day (QID) | RESPIRATORY_TRACT | Status: DC
  Administered 2019-04-10 – 2019-05-02 (×84): 1 via RESPIRATORY_TRACT
  Filled 2019-04-10: qty 4

## 2019-04-10 MED ORDER — METHYLPREDNISOLONE SODIUM SUCC 40 MG IJ SOLR
40.0000 mg | Freq: Three times a day (TID) | INTRAMUSCULAR | Status: DC
  Administered 2019-04-10 – 2019-04-11 (×5): 40 mg via INTRAVENOUS
  Filled 2019-04-10 (×5): qty 1

## 2019-04-10 MED ORDER — SENNOSIDES-DOCUSATE SODIUM 8.6-50 MG PO TABS
2.0000 | ORAL_TABLET | Freq: Every evening | ORAL | Status: DC | PRN
  Administered 2019-04-19 – 2019-05-06 (×5): 2 via ORAL
  Filled 2019-04-10 (×5): qty 2

## 2019-04-10 MED ORDER — HYDRALAZINE HCL 20 MG/ML IJ SOLN
10.0000 mg | INTRAMUSCULAR | Status: DC | PRN
  Filled 2019-04-10: qty 0.5

## 2019-04-10 MED ORDER — POLYETHYLENE GLYCOL 3350 17 G PO PACK
17.0000 g | PACK | Freq: Every day | ORAL | Status: DC | PRN
  Administered 2019-04-20: 17 g via ORAL
  Filled 2019-04-10: qty 1

## 2019-04-10 MED ORDER — TOCILIZUMAB 400 MG/20ML IV SOLN
800.0000 mg | Freq: Once | INTRAVENOUS | Status: AC
  Administered 2019-04-10: 800 mg via INTRAVENOUS
  Filled 2019-04-10: qty 40

## 2019-04-10 MED ORDER — TAMSULOSIN HCL 0.4 MG PO CAPS
0.4000 mg | ORAL_CAPSULE | Freq: Every day | ORAL | Status: DC
  Administered 2019-04-10 – 2019-05-07 (×28): 0.4 mg via ORAL
  Filled 2019-04-10 (×28): qty 1

## 2019-04-10 MED ORDER — ENOXAPARIN SODIUM 100 MG/ML ~~LOC~~ SOLN
100.0000 mg | Freq: Two times a day (BID) | SUBCUTANEOUS | Status: DC
  Administered 2019-04-10 – 2019-04-12 (×5): 100 mg via SUBCUTANEOUS
  Filled 2019-04-10 (×5): qty 1

## 2019-04-10 MED ORDER — VITAMIN C 500 MG PO TABS
500.0000 mg | ORAL_TABLET | Freq: Every day | ORAL | Status: DC
  Administered 2019-04-10 – 2019-05-07 (×28): 500 mg via ORAL
  Filled 2019-04-10 (×28): qty 1

## 2019-04-10 MED ORDER — ZINC SULFATE 220 (50 ZN) MG PO CAPS
220.0000 mg | ORAL_CAPSULE | Freq: Every day | ORAL | Status: DC
  Administered 2019-04-10 – 2019-05-07 (×28): 220 mg via ORAL
  Filled 2019-04-10 (×29): qty 1

## 2019-04-10 MED ORDER — SODIUM CHLORIDE 0.9% IV SOLUTION
Freq: Once | INTRAVENOUS | Status: AC
  Administered 2019-04-10: 10:00:00 via INTRAVENOUS

## 2019-04-10 NOTE — Progress Notes (Signed)
Venous duplex lower ext  has been completed. Refer to [facility] under chart review to view preliminary results.   04/10/2019  3:33 PM Katheen Aslin, Bonnye Fava

## 2019-04-10 NOTE — Progress Notes (Signed)
[place] for Lovenox Indication: pulmonary embolus   No Known Allergies  Patient Measurements: Height: 5\' 6"  (167.6 cm) Weight: 230 lb (104.3 kg) IBW/kg (Calculated) : 63.8  Vital Signs: Temp: 97.5 F (36.4 C) (11/17 0810) Temp Source: Oral (11/17 0810) BP: 123/81 (11/17 0810) Pulse Rate: 79 (11/17 0810)  Labs: Recent Labs    04/08/19 2226 04/09/19 0445 04/09/19 0557  HGB 15.7  --  14.8  HCT 46.4  --  43.4  PLT 201  --  204  CREATININE 1.46* 1.39*  --   TROPONINIHS  --  41*  --     Estimated Creatinine Clearance: 61.6 mL/min (A) (by C-G formula based on SCr of 1.39 mg/dL (H)).   Medical History: Past Medical History:  Diagnosis Date  . GERD (gastroesophageal reflux disease)   . 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 Hyperlipidemia   . Hypertension   . Liver disorder    tx with ursodiol    Assessment: 63 yo M admitted with COVID-19 PNA and PE. Pharmacy consulted to dose Lovenox.   Goal of Therapy:  Anti-Xa level 0.6-1 units/ml 4hrs after LMWH dose given Monitor platelets by anticoagulation protocol: Yes   Plan:  Lovenox 1 mg/kg q 12 hours.  Monitor CBC, renal function, s/s of bleeding  Ulice Dash D 04/10/2019,9:27 AM

## 2019-04-10 NOTE — Progress Notes (Signed)
Called and spoke to patient's wife Jamas Seigle with an update.

## 2019-04-10 NOTE — Progress Notes (Signed)
PROGRESS NOTE    [name]  [id number] DOB: 07/20/1955 DOA: 04/08/2019 PCP: [name], FNP   Brief Narrative:  63 year old with history of HTN, HLD, right BKA, gallstones on ursodiol presented on 11/15 with 4 days of URI.  Initially went to urgent care and was given prednisone while awaiting COVID-19 test results.  Presented to the ED due to shortness of breath on 11/15 evening.  Noted to be hypoxic.  She was started on remdesivir, steroids and empiric antibiotics.   Assessment & Plan:   Active Problems:   Acute respiratory failure with hypoxia (HCC)   Essential hypertension   HLD (hyperlipidemia)   Community acquired pneumonia   Pneumonia due to COVID-19 virus   Transaminitis   Hyperglycemia   Severe sepsis (HCC)   AKI (acute kidney injury) (HCC)   CKD (chronic kidney disease) stage 2, GFR 60-89 ml/min  Sepsis secondary to COVID-19 pneumonia Acute hypoxic respiratory failure secondary to COVID-19 -Oxygen levels-15 L high flow -Remdesivir-day 2 -Solu-Medrol every 8. Covalescent plasma and Actemra today.  -Routine: Labs have been reviewed including ferritin, LDH, CRP, d-dimer, fibrinogen.  Will need to trend this lab daily. -Vitamin C & Zinc. Prone >16hrs/day.  Bronchodilators ordered -procalcitonin-0.15.  Discontinue antibiotics -CTA chest-positive for segmental/subsegmental pulmonary embolism in the right lower lobe pulmonary artery, diffuse groundglass opacity -Supportive care-antitussive, inhalers, I-S/flutter  Had extensive discussion with the patient regarding off label use of Actemra and emergent use of convalescent plasma given worsening of shortness of breath, oxygen requirement from hypoxia and elevation in inflammatory markers.  Denies any active/latent tuberculosis, hepatitis infection, active immunosuppressive state including chemotherapy.  No severe sepsis.  Patient and family understood and all the questions answered.  Agreed to proceed with  it.  Segmental/subsegmental pulmonary embolism in the right lower lobe pulmonary artery without cor pulmonale -Lovenox, pharmacy consulted for PE dosing -Echocardiogram -Lower extremity Dopplers  4 mm nonobstructive right lower pole renal stone without hydronephrosis -Started Flomax  CKD stage II -Creatinine around baseline of 1.4.  Transaminitis History of gallstones -Likely in the setting of acute infection/sepsis.  We will continue to monitor and trend this.  Hyperglycemia steroid-induced Diabetes mellitus type 2 -Hemoglobin A1c 7.1 -Insulin sliding scale and Accu-Cheks  Essential hypertension -Home regimen on hold secondary to sepsis-Diovan/HCTZ  Hyperlipidemia -Lipitor 40 mg  DVT prophylaxis: Treatment dose for PE Code Status: Full code Family Communication: Called Veronica; didn't asnwer. Left a voicemail.  Disposition Plan: Maintain hospital stay as patient is significantly hypoxic requiring 15 L of nasal cannula high flow.  Currently undergoing treatment of pulmonary embolism.   Subjective: At rest he feels okay but gets hypoxic and short of breath with movement.  Review of Systems Otherwise negative except as per HPI, including: General: Denies fever, chills, night sweats or unintended weight loss. Resp: Denies hemoptysis Cardiac: Denies chest pain, palpitations, orthopnea, paroxysmal nocturnal dyspnea. GI: Denies abdominal pain, nausea, vomiting, diarrhea or constipation GU: Denies dysuria, frequency, hesitancy or incontinence MS: Denies muscle aches, joint pain or swelling Neuro: Denies headache, neurologic deficits (focal weakness, numbness, tingling), abnormal gait Psych: Denies anxiety, depression, SI/HI/AVH Skin: Denies new rashes or lesions ID: Denies sick contacts, exotic exposures, travel  Objective: Vitals:   04/09/19 2037 04/10/19 0038 04/10/19 0410 04/10/19 0810  BP: 127/81 120/78 116/74 123/81  Pulse:  92 79 79  Resp: 17 20 20 17   Temp:  98.4 F (36.9 C) 98 F (36.7 C) 98.3 F (36.8 C) (!) 97.5 F (36.4 C)  TempSrc: Oral  Oral  SpO2: 90% 92% 95% 91%  Weight:      Height:        Intake/Output Summary (Last 24 hours) at 04/10/2019 0817 Last data filed at 04/10/2019 0500 Gross per 24 hour  Intake 635.16 ml  Output 1325 ml  Net -689.84 ml   Filed Weights   04/09/19 0300  Weight: 104.3 kg    Examination:  General exam: Appears calm and comfortable, 15 L high flow nasal cannula Respiratory system: Bilateral rhonchi Cardiovascular system: S1 & S2 heard, RRR. No JVD, murmurs, rubs, gallops or clicks. No pedal edema. Gastrointestinal system: Abdomen is nondistended, soft and nontender. No organomegaly or masses felt. Normal bowel sounds heard. Central nervous system: Alert and oriented. No focal neurological deficits. Extremities: Right lower extremity amputation noted. Skin: No rashes, lesions or ulcers Psychiatry: Judgement and insight appear normal. Mood & affect appropriate.     Data Reviewed:   CBC: Recent Labs  Lab 04/08/19 2226 04/09/19 0557  WBC 12.4* 13.9*  NEUTROABS 11.5* 12.4*  HGB 15.7 14.8  HCT 46.4 43.4  MCV 91.5 92.7  PLT 201 0000000   Basic Metabolic Panel: Recent Labs  Lab 04/08/19 2226 04/09/19 0445  NA 137 139  K 3.7 3.9  CL 99 101  CO2 23 23  GLUCOSE 230* 175*  BUN 32* 33*  CREATININE 1.46* 1.39*  CALCIUM 8.5* 7.9*   GFR: Estimated Creatinine Clearance: 61.6 mL/min (A) (by C-G formula based on SCr of 1.39 mg/dL (H)). Liver Function Tests: Recent Labs  Lab 04/08/19 2226 04/09/19 0445  AST 76* 67*  ALT 63* 58*  ALKPHOS 103 89  BILITOT 0.6 0.5  PROT 7.9 7.1  ALBUMIN 3.2* 2.9*   No results for input(s): LIPASE, AMYLASE in the last 168 hours. No results for input(s): AMMONIA in the last 168 hours. Coagulation Profile: No results for input(s): INR, PROTIME in the last 168 hours. Cardiac Enzymes: No results for input(s): CKTOTAL, CKMB, CKMBINDEX, TROPONINI in the  last 168 hours. BNP (last 3 results) No results for input(s): PROBNP in the last 8760 hours. HbA1C: Recent Labs    04/09/19 0445  HGBA1C 7.1*   CBG: Recent Labs  Lab 04/09/19 1252 04/09/19 1811  GLUCAP 192* 149*   Lipid Profile: No results for input(s): CHOL, HDL, LDLCALC, TRIG, CHOLHDL, LDLDIRECT in the last 72 hours. Thyroid Function Tests: No results for input(s): TSH, T4TOTAL, FREET4, T3FREE, THYROIDAB in the last 72 hours. Anemia Panel: Recent Labs    04/09/19 0445  FERRITIN 765*   Sepsis Labs: Recent Labs  Lab 04/08/19 2226 04/09/19 0445 04/09/19 0802 04/09/19 1715  PROCALCITON  --  0.15  --   --   LATICACIDVEN 2.3* 2.1* 2.4* 2.1*    Recent Results (from the past 240 hour(s))  Novel Coronavirus, NAA ([facility])     Status: Abnormal   Collection Time: 04/04/19 12:00 AM   Specimen: Nasopharyngeal(NP) swabs in vial transport medium   NASOPHARYNGE  TESTING  Result Value Ref Range Status   SARS-CoV-2, NAA Detected (A) Not Detected Final    Comment: This nucleic acid amplification test was developed and its performance characteristics determined by [employer]. Nucleic acid amplification tests include PCR and TMA. This test has not been FDA cleared or approved. This test has been authorized by FDA under an Emergency Use Authorization (EUA). This test is only authorized for the duration of time the declaration that circumstances exist justifying the authorization of the emergency use of in vitro diagnostic tests for detection  of SARS-CoV-2 virus and/or diagnosis of COVID-19 infection under section 564(b)(1) of the Act, 21 U.S.C. [id number]) (1), unless the authorization is terminated or revoked sooner. When diagnostic testing is negative, the possibility of a false negative result should be considered in the context of a patient's recent exposures and the presence of clinical signs and symptoms consistent with COVID-19. An individual without symptoms of  COVID-19 and who is not shedding SARS-CoV-2 virus would  expect to have a negative (not detected) result in this assay.   Blood Culture (routine x 2)     Status: None (Preliminary result)   Collection Time: 04/08/19 10:45 PM   Specimen: BLOOD  Result Value Ref Range Status   Specimen Description BLOOD RIGHT ANTECUBITAL  Final   Special Requests   Final    BOTTLES DRAWN AEROBIC AND ANAEROBIC Blood Culture adequate volume   Culture   Final    NO GROWTH < 12 HOURS Performed at [facility], [place] [address], [place], [place] [address]    Report Status PENDING  Incomplete  Blood Culture (routine x 2)     Status: None (Preliminary result)   Collection Time: 04/08/19 10:51 PM   Specimen: BLOOD  Result Value Ref Range Status   Specimen Description BLOOD LEFT ANTECUBITAL  Final   Special Requests   Final    BOTTLES DRAWN AEROBIC AND ANAEROBIC Blood Culture adequate volume   Culture   Final    NO GROWTH < 12 HOURS Performed at [facility], [place] [address] [place], [place] [address]    Report Status PENDING  Incomplete  SARS CORONAVIRUS 2 (TAT 6-24 HRS) Nasopharyngeal Nasopharyngeal Swab     Status: Abnormal   Collection Time: 04/08/19 10:55 PM   Specimen: Nasopharyngeal Swab  Result Value Ref Range Status   SARS Coronavirus 2 POSITIVE (A) NEGATIVE Final    Comment: RESULT CALLED TO, READ BACK BY AND VERIFIED WITH: RN P PULLIAM AT [id number] 04/09/2019 BY L BENFIELD (NOTE) SARS-CoV-2 target nucleic acids are DETECTED. The SARS-CoV-2 RNA is generally detectable in upper and lower respiratory specimens during the acute phase of infection. Positive results are indicative of active infection with SARS-CoV-2. Clinical  correlation with patient history and other diagnostic information is necessary to determine patient infection status. Positive results do  not rule out bacterial infection or co-infection with other viruses. The expected result is Negative. Fact Sheet for  Patients: SugarRoll.be Fact Sheet for Healthcare Providers: https://www.woods-mathews.com/ This test is not yet approved or cleared by the [place] FDA and  has been authorized for detection and/or diagnosis of SARS-CoV-2 by FDA under an Emergency Use Authorization (EUA). This EUA will remain  in effect (meaning this test can be u sed) for the duration of the COVID-19 declaration under Section 564(b)(1) of the Act, 21 U.S.C. section 360bbb-3(b)(1), unless the authorization is terminated or revoked sooner. Performed at [facility], [place] [address] [place],  [address]          Radiology Studies: Dg Chest Port 1 View  Result Date: 04/08/2019 CLINICAL DATA:  Shortness of breath, fever EXAM: PORTABLE CHEST 1 VIEW COMPARISON:  12/13/2005 FINDINGS: Low lung volumes. No focal airspace disease or effusion. Stable cardiomediastinal silhouette. No pneumothorax. IMPRESSION: No active disease. Electronically Signed   By: Donavan Foil M.D.   On: 04/08/2019 23:13   Ct Angio Chest/abd/pel For Dissection W And/or W/wo  Addendum Date: 04/09/2019   ADDENDUM REPORT: 04/09/2019 20:47 ADDENDUM: Critical Value/emergent results were  called by telephone at the time of interpretation on 04/09/2019 at 8:37 pm to provider Dr Shanon Brow, who verbally acknowledged these results. Electronically Signed   By: Julian Hy M.D.   On: 04/09/2019 20:47   Result Date: 04/09/2019 CLINICAL DATA:  Shortness of breath, COVID positive, positive D-dimer, evaluate for PE EXAM: CT ANGIOGRAPHY CHEST, ABDOMEN AND PELVIS TECHNIQUE: Multidetector CT imaging through the chest, abdomen and pelvis was performed using the standard protocol during bolus administration of intravenous contrast. Multiplanar reconstructed images and MIPs were obtained and reviewed to evaluate the vascular anatomy. CONTRAST:  131mL OMNIPAQUE IOHEXOL 350 MG/ML SOLN COMPARISON:  Chest radiograph dated  04/08/2019 FINDINGS: CTA CHEST FINDINGS Cardiovascular: On unenhanced CT, there is no evidence of intramural hematoma. Preferential opacification of the bilateral pulmonary arteries to the segmental level. Segmental and subsegmental pulmonary emboli within branches of the right lower lobe pulmonary artery (series 7/images 214 and 229). Overall clot burden is mild. No evidence of right heart strain. Although not tailored for evaluation of the thoracic aorta, there is no evidence of thoracic aortic aneurysm or dissection. Mild atherosclerotic calcifications of the aortic arch. The heart is normal in size.  No pericardial effusion. Mediastinum/Nodes: Small mediastinal lymph nodes which do not meet pathologic CT size criteria. Visualized thyroid is unremarkable. Lungs/Pleura: Multifocal/diffuse ground-glass opacities throughout the lungs bilaterally, with relative sparing of the lung apices, and sparing of the subpleural lungs. This appearance is nonspecific but is likely related to the patient's known COVID. No underlying suspicious pulmonary nodules. No pleural effusion or pneumothorax. Musculoskeletal: Mild degenerative changes of the thoracic spine. Review of the MIP images confirms the above findings. CTA ABDOMEN AND PELVIS FINDINGS VASCULAR Aorta: No evidence abdominal aortic aneurysm or dissection.  Patent. Celiac: Patent. SMA: Patent. Renals: Patent bilaterally. Mild atherosclerotic calcifications at the origin on the left. IMA: Patent. Inflow: Patent.  Atherosclerotic calcifications bilaterally. Veins: Grossly unremarkable. Review of the MIP images confirms the above findings. NON-VASCULAR Hepatobiliary: Liver is within normal limits. Status post cholecystectomy. No intrahepatic or extrahepatic ductal dilatation. Pneumobilia. Pancreas: Within normal limits. Spleen: Within normal limits. Adrenals/Urinary Tract: Adrenal glands are within normal limits. 4 mm nonobstructing right lower pole renal calculus (series  7/image 49). 10 mm posterior left lower pole renal cyst (series 7/image 478), benign. No hydronephrosis. Thick-walled bladder, although underdistended. Stomach/Bowel: Stomach is within normal limits. No evidence of bowel obstruction. Normal appendix (series 7/image 47). Mild sigmoid diverticulosis, without evidence of diverticulitis. Lymphatic: No suspicious abdominopelvic lymphadenopathy. Reproductive: Prostate is unremarkable. Other: No abdominopelvic ascites. Musculoskeletal: Visualized osseous structures are within normal limits. Review of the MIP images confirms the above findings. IMPRESSION: Segmental and subsegmental pulmonary embolism within branches of the right lower lobe pulmonary artery. Overall clot burden is mild. No evidence of right heart strain. No evidence of thoracoabdominal aortic aneurysm or dissection. Multifocal/diffuse ground-glass opacities in the lungs bilaterally, likely related to the patient's known COVID. 4 mm nonobstructing right lower pole renal calculus. No hydronephrosis. Electronically Signed: By: Julian Hy M.D. On: 04/09/2019 20:28        Scheduled Meds:  aspirin  81 mg Oral Daily   atorvastatin  40 mg Oral q1800   dexamethasone (DECADRON) injection  6 mg Intravenous Q0600   enoxaparin (LOVENOX) injection  1 mg/kg Subcutaneous Q24H   famotidine  20 mg Oral BID   insulin aspart  0-9 Units Subcutaneous TID WC   LORazepam  0.5 mg Intravenous Once   ursodiol  300 mg Oral BID   Continuous Infusions:  azithromycin Stopped (04/10/19 [id number])   cefTRIAXone (ROCEPHIN)  IV Stopped (04/10/19 0503)   remdesivir 100 mg in NS 250 mL       LOS: 1 day   Time spent= 40 mins    Lanaysia Fritchman Arsenio Loader, MD [facility]  If 7PM-7AM, please contact night-coverage  04/10/2019, 8:17 AM

## 2019-04-10 NOTE — Progress Notes (Signed)
  Echocardiogram 2D Echocardiogram has been performed.  [name] 04/10/2019, 2:38 PM

## 2019-04-11 ENCOUNTER — Ambulatory Visit: Payer: PRIVATE HEALTH INSURANCE | Admitting: Nurse Practitioner

## 2019-04-11 DIAGNOSIS — R739 Hyperglycemia, unspecified: Secondary | ICD-10-CM

## 2019-04-11 LAB — MAGNESIUM: Magnesium: 2.5 mg/dL — ABNORMAL HIGH (ref 1.7–2.4)

## 2019-04-11 LAB — COMPREHENSIVE METABOLIC PANEL WITH GFR
ALT: 50 U/L — ABNORMAL HIGH (ref 0–44)
AST: 52 U/L — ABNORMAL HIGH (ref 15–41)
Albumin: 3.4 g/dL — ABNORMAL LOW (ref 3.5–5.0)
Alkaline Phosphatase: 138 U/L — ABNORMAL HIGH (ref 38–126)
Anion gap: 15 (ref 5–15)
BUN: 30 mg/dL — ABNORMAL HIGH (ref 8–23)
CO2: 25 mmol/L (ref 22–32)
Calcium: 8.7 mg/dL — ABNORMAL LOW (ref 8.9–10.3)
Chloride: 98 mmol/L (ref 98–111)
Creatinine, Ser: 1.03 mg/dL (ref 0.61–1.24)
GFR calc Af Amer: >60 mL/min (ref >60)
GFR calc non Af Amer: >60 mL/min (ref >60)
Glucose, Bld: 180 mg/dL — ABNORMAL HIGH (ref 70–99)
Potassium: 4.4 mmol/L (ref 3.5–5.1)
Sodium: 138 mmol/L (ref 135–145)
Total Bilirubin: 0.7 mg/dL (ref 0.3–1.2)
Total Protein: 7.9 g/dL (ref 6.5–8.1)

## 2019-04-11 LAB — PREPARE FRESH FROZEN PLASMA

## 2019-04-11 LAB — CBC
HCT: 48.2 % (ref 39.0–52.0)
Hemoglobin: 16.1 g/dL (ref 13.0–17.0)
MCH: 30.8 pg (ref 26.0–34.0)
MCHC: 33.4 g/dL (ref 30.0–36.0)
MCV: 92.3 fL (ref 80.0–100.0)
Platelets: 197 10*3/uL (ref 150–400)
RBC: 5.22 MIL/uL (ref 4.22–5.81)
RDW: 13.2 % (ref 11.5–15.5)
WBC: 16 10*3/uL — ABNORMAL HIGH (ref 4.0–10.5)
nRBC: 0 % (ref 0.0–0.2)

## 2019-04-11 LAB — BPAM FFP
Blood Product Expiration Date: 202011181324
ISSUE DATE / TIME: 202011171336
Unit Type and Rh: 5100

## 2019-04-11 LAB — GLUCOSE, CAPILLARY
Glucose-Capillary: 200 mg/dL — ABNORMAL HIGH (ref 70–99)
Glucose-Capillary: 223 mg/dL — ABNORMAL HIGH (ref 70–99)
Glucose-Capillary: 190 mg/dL — ABNORMAL HIGH (ref 70–99)
Glucose-Capillary: 185 mg/dL — ABNORMAL HIGH (ref 70–99)

## 2019-04-11 LAB — FERRITIN: Ferritin: 570 ng/mL — ABNORMAL HIGH (ref 24–336)

## 2019-04-11 LAB — LACTATE DEHYDROGENASE: LDH: 965 U/L — ABNORMAL HIGH (ref 98–192)

## 2019-04-11 LAB — C-REACTIVE PROTEIN: CRP: 14.7 mg/dL — ABNORMAL HIGH (ref <1.0)

## 2019-04-11 LAB — D-DIMER, QUANTITATIVE: D-Dimer, Quant: >20 ug/mL-FEU — ABNORMAL HIGH (ref 0.00–0.50)

## 2019-04-11 MED ORDER — DEXAMETHASONE SODIUM PHOSPHATE 10 MG/ML IJ SOLN
6.0000 mg | INTRAMUSCULAR | Status: DC
  Administered 2019-04-12 – 2019-04-19 (×8): 6 mg via INTRAVENOUS
  Filled 2019-04-11 (×9): qty 1

## 2019-04-11 NOTE — Progress Notes (Signed)
PROGRESS NOTE    [name]  [id number] DOB: [birth year] DOA: 04/08/2019 PCP: [name], FNP    Brief Narrative:  63 year old male who presented with dyspnea, he does have significant past medical history for hypertension, dyslipidemia and right BKA.  Patient reported 4 days of shortness of breath, associated with productive cough, fevers, and chills.  He was treated as an outpatient with prednisone and oral antibiotics but due to persistent symptoms he came to the hospital for further evaluation.  His initial physical examination his blood pressure was 115/77, pulse rate 98, respiratory 21, oxygen saturation 88%.  His lungs had no rhonchi or rails, heart S1-S2 present rhythmic, abdomen soft, no lower extremity edema.  SARS COVID-19 was positive. Chest radiograph (personally reviewed), faint infiltrate left upper lobe interstitial.  Patient was admitted to the hospital working diagnosis of acute hypoxic respiratory failure due to SARS COVID-19 viral pneumonia.  Patient has been treated with systemic steroids and remdesivir.  Due to persistent and worsening hypoxic respiratory failure he received Actemra and convalescent plasma.  His follow-up CT chest showed segmental and subsegmental pulmonary embolism within branches of the right lower lobe.  Assessment & Plan:   Active Problems:   Acute respiratory failure with hypoxia (HCC)   Essential hypertension   HLD (hyperlipidemia)   Community acquired pneumonia   Pneumonia due to COVID-19 virus   Transaminitis   Hyperglycemia   Severe sepsis (HCC)   AKI (acute kidney injury) (HCC)   CKD (chronic kidney disease) stage 2, GFR 60-89 ml/min   Bilateral pulmonary embolism ([place])   1.  Acute hypoxic respiratory failure due to SARS COVID-19 viral pneumonia.   RR: 20  Pulse oxymetry: 91%  Fi02: 15 LPM HFNC    COVID-19 Labs  Recent Labs    04/09/19 0445 04/10/19 0610 04/10/19 0800 04/11/19 0535  DDIMER 1.91*  --  >20.00*  >20.00*  FERRITIN 765* 812*  --  570*  LDH  --  952*  --  965*  CRP 15.5* 14.7*  --  14.7*    Lab Results  Component Value Date   SARSCOV2NAA POSITIVE (A) 04/08/2019   SARSCOV2NAA Detected (A) 04/04/2019   Stable inflammatory markers, still elevated.   Sp convalescent plasma and actemra. Continue medical therapy with remdesivir #3/5 and systemic steroids with dexamethasone. Continue bronchodilator therapy, antitussive agents and airway clearing techniques with incentive spirometer and flutter valve.   2. T2DM with steroid induced hyperglycemia. Continue glucose cover and monitoring with insulin sliding scale, patient is tolerating po well, no nausea or vomiting.   3. Dyslipidemia. Continue statin therapy with atorvastatin.  4. BPH. Continue with flomax.   DVT prophylaxis: enoxaparin   Code Status:  full Family Communication: no family at the bedside  Disposition Plan/ discharge barriers: pending clinical improvement.   Body mass index is 37.12 kg/m. Malnutrition Type:      Malnutrition Characteristics:      Nutrition Interventions:     RN Pressure Injury Documentation:     Consultants:     Procedures:     Antimicrobials:       Subjective: Patient continue to have dyspnea on exertion and positive cough, his symptoms have improved but not back to baseline, continue to have significant limitation in his physical activity.   Objective: Vitals:   04/10/19 1745 04/10/19 2005 04/10/19 2350 04/11/19 0416  BP: 138/85 133/78 (!) 141/84 128/83  Pulse: 74  90 92  Resp: 19  20 20   Temp: 98 F (36.7 C) 98.3  F (36.8 C) 98.1 F (36.7 C) 98 F (36.7 C)  TempSrc: Axillary Oral    SpO2: 91%  91% 91%  Weight:      Height:        Intake/Output Summary (Last 24 hours) at 04/11/2019 1131 Last data filed at 04/11/2019 0852 Gross per 24 hour  Intake 1162 ml  Output 1350 ml  Net -188 ml   Filed Weights   04/09/19 0300  Weight: 104.3 kg    Examination:    General: Not in pain or dyspnea, deconditioned.  Neurology: Awake and alert, non focal  E ENT: no pallor, no icterus, oral mucosa moist Cardiovascular: No JVD. S1-S2 present, rhythmic, no gallops, rubs, or murmurs. No lower extremity edema. Pulmonary: positive breath sounds bilaterally.  Gastrointestinal. Abdomen with no organomegaly, non tender, no rebound or guarding Skin. No rashes Musculoskeletal: no joint deformities     Data Reviewed: I have personally reviewed following labs and imaging studies  CBC: Recent Labs  Lab 04/08/19 2226 04/09/19 0557 04/10/19 0800 04/11/19 0535  WBC 12.4* 13.9* 14.4* 16.0*  NEUTROABS 11.5* 12.4*  --   --   HGB 15.7 14.8 15.4 16.1  HCT 46.4 43.4 45.7 48.2  MCV 91.5 92.7 94.2 92.3  PLT 201 204 176 [id number]   Basic Metabolic Panel: Recent Labs  Lab 04/08/19 2226 04/09/19 0445 04/10/19 0610 04/11/19 0535  NA 137 139 140 138  K 3.7 3.9 3.9 4.4  CL 99 101 98 98  CO2 23 23 28 25   GLUCOSE 230* 175* 130* 180*  BUN 32* 33* 25* 30*  CREATININE 1.46* 1.39* 1.00 1.03  CALCIUM 8.5* 7.9* 8.4* 8.7*  MG  --   --   --  2.5*   GFR: Estimated Creatinine Clearance: 83.1 mL/min (by C-G formula based on SCr of 1.03 mg/dL). Liver Function Tests: Recent Labs  Lab 04/08/19 2226 04/09/19 0445 04/10/19 0610 04/11/19 0535  AST 76* 67* 60* 52*  ALT 63* 58* 55* 50*  ALKPHOS 103 89 112 138*  BILITOT 0.6 0.5 1.0 0.7  PROT 7.9 7.1 7.8 7.9  ALBUMIN 3.2* 2.9* 3.4* 3.4*   No results for input(s): LIPASE, AMYLASE in the last 168 hours. No results for input(s): AMMONIA in the last 168 hours. Coagulation Profile: No results for input(s): INR, PROTIME in the last 168 hours. Cardiac Enzymes: No results for input(s): CKTOTAL, CKMB, CKMBINDEX, TROPONINI in the last 168 hours. BNP (last 3 results) No results for input(s): PROBNP in the last 8760 hours. HbA1C: Recent Labs    04/09/19 0445  HGBA1C 7.1*   CBG: Recent Labs  Lab 04/10/19 0854 04/10/19  1206 04/10/19 1653 04/10/19 2020 04/11/19 0726  GLUCAP 157* 193* 207* 192* 200*   Lipid Profile: No results for input(s): CHOL, HDL, LDLCALC, TRIG, CHOLHDL, LDLDIRECT in the last 72 hours. Thyroid Function Tests: No results for input(s): TSH, T4TOTAL, FREET4, T3FREE, THYROIDAB in the last 72 hours. Anemia Panel: Recent Labs    04/10/19 0610 04/11/19 0535  FERRITIN 812* 570*      Radiology Studies: I have reviewed all of the imaging during this hospital visit personally     Scheduled Meds: . aspirin  81 mg Oral Daily  . atorvastatin  40 mg Oral q1800  . enoxaparin (LOVENOX) injection  100 mg Subcutaneous Q12H  . famotidine  20 mg Oral BID  . insulin aspart  0-9 Units Subcutaneous TID WC  . Ipratropium-Albuterol  1 puff Inhalation Q6H  . LORazepam  0.5 mg Intravenous Once  .  methylPREDNISolone (SOLU-MEDROL) injection  40 mg Intravenous Q8H  . tamsulosin  0.4 mg Oral QPC supper  . ursodiol  300 mg Oral BID  . vitamin C  500 mg Oral Daily  . zinc sulfate  220 mg Oral Daily   Continuous Infusions: . remdesivir 100 mg in NS 250 mL 100 mg (04/11/19 1008)     LOS: 2 days        Rutha Melgoza Gerome Apley, MD

## 2019-04-12 LAB — COMPREHENSIVE METABOLIC PANEL
ALT: 49 U/L — ABNORMAL HIGH (ref 0–44)
ALT: 48 U/L — ABNORMAL HIGH (ref 0–44)
AST: 56 U/L — ABNORMAL HIGH (ref 15–41)
AST: 62 U/L — ABNORMAL HIGH (ref 15–41)
Albumin: 3.1 g/dL — ABNORMAL LOW (ref 3.5–5.0)
Albumin: 3.1 g/dL — ABNORMAL LOW (ref 3.5–5.0)
Alkaline Phosphatase: 171 U/L — ABNORMAL HIGH (ref 38–126)
Alkaline Phosphatase: 162 U/L — ABNORMAL HIGH (ref 38–126)
Anion gap: 16 — ABNORMAL HIGH (ref 5–15)
Anion gap: 13 (ref 5–15)
BUN: 29 mg/dL — ABNORMAL HIGH (ref 8–23)
BUN: 29 mg/dL — ABNORMAL HIGH (ref 8–23)
CO2: 23 mmol/L (ref 22–32)
CO2: 25 mmol/L (ref 22–32)
Calcium: 8.6 mg/dL — ABNORMAL LOW (ref 8.9–10.3)
Calcium: 8 mg/dL — ABNORMAL LOW (ref 8.9–10.3)
Chloride: 100 mmol/L (ref 98–111)
Chloride: 102 mmol/L (ref 98–111)
Creatinine, Ser: 1.15 mg/dL (ref 0.61–1.24)
Creatinine, Ser: 1.2 mg/dL (ref 0.61–1.24)
GFR calc Af Amer: >60 mL/min (ref >60)
GFR calc Af Amer: >60 mL/min (ref >60)
GFR calc non Af Amer: >60 mL/min (ref >60)
GFR calc non Af Amer: >60 mL/min (ref >60)
Glucose, Bld: 150 mg/dL — ABNORMAL HIGH (ref 70–99)
Glucose, Bld: 176 mg/dL — ABNORMAL HIGH (ref 70–99)
Potassium: 4.3 mmol/L (ref 3.5–5.1)
Potassium: 4.5 mmol/L (ref 3.5–5.1)
Sodium: 139 mmol/L (ref 135–145)
Sodium: 140 mmol/L (ref 135–145)
Total Bilirubin: 0.8 mg/dL (ref 0.3–1.2)
Total Bilirubin: 1.6 mg/dL — ABNORMAL HIGH (ref 0.3–1.2)
Total Protein: 7 g/dL (ref 6.5–8.1)
Total Protein: 6.6 g/dL (ref 6.5–8.1)

## 2019-04-12 LAB — GLUCOSE, CAPILLARY
Glucose-Capillary: 123 mg/dL — ABNORMAL HIGH (ref 70–99)
Glucose-Capillary: 151 mg/dL — ABNORMAL HIGH (ref 70–99)
Glucose-Capillary: 199 mg/dL — ABNORMAL HIGH (ref 70–99)
Glucose-Capillary: 117 mg/dL — ABNORMAL HIGH (ref 70–99)

## 2019-04-12 LAB — C-REACTIVE PROTEIN: CRP: 7.9 mg/dL — ABNORMAL HIGH (ref <1.0)

## 2019-04-12 LAB — FERRITIN: Ferritin: 479 ng/mL — ABNORMAL HIGH (ref 24–336)

## 2019-04-12 LAB — D-DIMER, QUANTITATIVE: D-Dimer, Quant: >20 ug/mL-FEU — ABNORMAL HIGH (ref 0.00–0.50)

## 2019-04-12 MED ORDER — SALINE SPRAY 0.65 % NA SOLN
1.0000 | NASAL | Status: DC | PRN
  Administered 2019-04-12 – 2019-05-06 (×11): 1 via NASAL
  Filled 2019-04-12 (×3): qty 44

## 2019-04-12 MED ORDER — RIVAROXABAN 15 MG PO TABS
15.0000 mg | ORAL_TABLET | Freq: Two times a day (BID) | ORAL | Status: AC
  Administered 2019-04-12 – 2019-05-03 (×42): 15 mg via ORAL
  Filled 2019-04-12 (×45): qty 1

## 2019-04-12 MED ORDER — RIVAROXABAN 20 MG PO TABS
20.0000 mg | ORAL_TABLET | Freq: Every day | ORAL | Status: DC
  Administered 2019-05-03: 20 mg via ORAL
  Filled 2019-04-12: qty 1

## 2019-04-12 MED ORDER — FLUTICASONE PROPIONATE 50 MCG/ACT NA SUSP
2.0000 | Freq: Two times a day (BID) | NASAL | Status: DC
  Administered 2019-04-12 – 2019-05-07 (×49): 2 via NASAL
  Filled 2019-04-12 (×2): qty 16

## 2019-04-12 MED ORDER — LIP MEDEX EX OINT
TOPICAL_OINTMENT | CUTANEOUS | Status: DC | PRN
  Filled 2019-04-12: qty 7

## 2019-04-12 NOTE — Progress Notes (Signed)
Unable to reach patient's wife, no answer on home phone and cell phone goes straight to voicemail. Voicemail left with return number.

## 2019-04-12 NOTE — Progress Notes (Signed)
[place] for Lovenox>Xarelto Indication: pulmonary embolus   No Known Allergies  Patient Measurements: Height: 5\' 6"  (167.6 cm) Weight: 230 lb (104.3 kg) IBW/kg (Calculated) : 63.8  Vital Signs: Temp: 98.2 F (36.8 C) (11/19 0744) Temp Source: Oral (11/19 0744) BP: 126/76 (11/19 0744) Pulse Rate: 85 (11/19 0744)  Labs: Recent Labs    04/10/19 0610 04/10/19 0800 04/11/19 0535 04/12/19 0305  HGB  --  15.4 16.1  --   HCT  --  45.7 48.2  --   PLT  --  176 197  --   CREATININE 1.00  --  1.03 1.15    Estimated Creatinine Clearance: 74.4 mL/min (by C-G formula based on SCr of 1.15 mg/dL).   Medical History: Past Medical History:  Diagnosis Date  . GERD (gastroesophageal reflux disease)   . 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 Hyperlipidemia   . Hypertension   . Liver disorder    tx with ursodiol    Assessment: 63 yo M admitted with COVID-19 PNA and PE. Pharmacy consulted to dose Lovenox then transition to [place]. Last dose of Loveonx 11/19 AM.    Plan:  Xarelto 15 mg bid x 21 days followed by 20 mg daily thereafter Monitor CBC, renal function, s/s of bleeding  Ulice Dash D 04/12/2019,9:31 AM

## 2019-04-12 NOTE — Progress Notes (Addendum)
PROGRESS NOTE    [name]  [id number] DOB: [birth year] DOA: 04/08/2019 PCP: [name], FNP    Brief Narrative:  63 year old male who presented with dyspnea, he does have significant past medical history for hypertension, dyslipidemia and right BKA.  Patient reported 4 days of shortness of breath, associated with productive cough, fevers, and chills.  He was treated as an outpatient with prednisone and oral antibiotics but due to persistent symptoms he came to the hospital for further evaluation.  His initial physical examination his blood pressure was 115/77, pulse rate 98, respiratory 21, oxygen saturation 88%.  His lungs had no rhonchi or rails, heart S1-S2 present rhythmic, abdomen soft, no lower extremity edema.  SARS COVID-19 was positive. Chest radiograph (personally reviewed), faint infiltrate left upper lobe interstitial.  Patient was admitted to the hospital working diagnosis of acute hypoxic respiratory failure due to SARS COVID-19 viral pneumonia.  Patient has been treated with systemic steroids and remdesivir.  Due to persistent and worsening hypoxic respiratory failure he received Actemra and convalescent plasma.  His follow-up CT chest showed segmental and subsegmental pulmonary embolism within branches of the right lower lobe.  Patient continue to have a slow recovery, continue with high oxygen requirements.   Assessment & Plan:   Principal Problem:   Pneumonia due to COVID-19 virus Active Problems:   Acute respiratory failure with hypoxia (HCC)   Essential hypertension   HLD (hyperlipidemia)   Community acquired pneumonia   Transaminitis   Hyperglycemia   AKI (acute kidney injury) ([place])   Bilateral pulmonary embolism ([place])   1.  Acute hypoxic respiratory failure due to SARS COVID-19 viral pneumonia. Sp convalescent plasma and actemra on 04/10/19.   RR: 21 Pulse oxymetry:90  Fi02: 14 LPM HFNC  COVID-19 Labs  Recent Labs    04/10/19 0610  04/10/19 0800 04/11/19 0535 04/12/19 0305  DDIMER  --  >20.00* >20.00* >20.00*  FERRITIN 812*  --  570* 479*  LDH 952*  --  965*  --   CRP 14.7*  --  14.7* 7.9*    Lab Results  Component Value Date   SARSCOV2NAA POSITIVE (A) 04/08/2019   SARSCOV2NAA Detected (A) 04/04/2019    Inflammatory markers trending down, d dimer continue more than 20 in the setting of DVT.   Continue medical therapy with remdesivir #4/5, systemic steroids with dexamethasone. On bronchodilator therapy, antitussive agents and airway clearing techniques with incentive spirometer and flutter valve. Out of bed as tolerated. Will get physical therapy evaluation and continue to encourage prone positioning.   Patient continue to be at high risk for worsening respiratory failure.   2. Acute pulmonary embolism. Blood pressure has been stable 126/76, further work up with echocardiogram with preserved left and right systolic function, he has been tolerating well enoxaparin, will transition to oral anticoagulation with rivaroxabn.   3. T2DM with steroid induced hyperglycemia. Fasting glucose this am is 176, will continue glucose cover and monitoring with insulin sliding scale.   4. Dyslipidemia. On atorvastatin.  5. BPH. On flomax, no signs of urinary retention.   6. Obesity. BMI is 37,12, will need outpatient follow up.   7. 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 Calculated GFR 111. Renal function with serum cr at 1,2, K at 4,5 and serum bicarbonate at 25.   No frank end organ failure due to infection, sepsis ruled out.   DVT prophylaxis: enoxaparin   Code Status:  full Family Communication: no family at the bedside  Disposition Plan/ discharge barriers: pending  clinical improvement.   Body mass index is 37.12 kg/m. Malnutrition Type:      Malnutrition Characteristics:      Nutrition Interventions:     RN Pressure Injury Documentation:     Consultants:     Procedures:     Antimicrobials:        Subjective: Patient with nasal congestion this am, continue to have dyspnea on exertion and at reset with positive improvement, but still very limited mobility. No nausea or vomiting.   Objective: Vitals:   04/11/19 2040 04/12/19 0010 04/12/19 0540 04/12/19 0744  BP: 126/90 108/77 136/77 126/76  Pulse: 79 93 69 85  Resp:    (!) 21  Temp: (!) 97.5 F (36.4 C) 98.3 F (36.8 C) 97.9 F (36.6 C) 98.2 F (36.8 C)  TempSrc: Oral Oral Oral Oral  SpO2: 90%  90% 90%  Weight:      Height:        Intake/Output Summary (Last 24 hours) at 04/12/2019 0856 Last data filed at 04/12/2019 0654 Gross per 24 hour  Intake 240 ml  Output 525 ml  Net -285 ml   Filed Weights   04/09/19 0300  Weight: 104.3 kg    Examination:   General: Not in pain, but positive dyspnea, deconditioned Neurology: Awake and alert, non focal  E ENT: positive pallor, no icterus, oral mucosa moist Cardiovascular: No JVD. S1-S2 present, rhythmic, no gallops, rubs, or murmurs. No lower extremity edema. Pulmonary: positive breath sounds bilaterally. Gastrointestinal. Abdomen with no organomegaly, non tender, no rebound or guarding Skin. No rashes Musculoskeletal: no joint deformities     Data Reviewed: I have personally reviewed following labs and imaging studies  CBC: Recent Labs  Lab 04/08/19 2226 04/09/19 0557 04/10/19 0800 04/11/19 0535  WBC 12.4* 13.9* 14.4* 16.0*  NEUTROABS 11.5* 12.4*  --   --   HGB 15.7 14.8 15.4 16.1  HCT 46.4 43.4 45.7 48.2  MCV 91.5 92.7 94.2 92.3  PLT 201 204 176 [id number]   Basic Metabolic Panel: Recent Labs  Lab 04/08/19 2226 04/09/19 0445 04/10/19 0610 04/11/19 0535 04/12/19 0305  NA 137 139 140 138 139  K 3.7 3.9 3.9 4.4 4.3  CL 99 101 98 98 100  CO2 23 23 28 25 23   GLUCOSE 230* 175* 130* 180* 150*  BUN 32* 33* 25* 30* 29*  CREATININE 1.46* 1.39* 1.00 1.03 1.15  CALCIUM 8.5* 7.9* 8.4* 8.7* 8.6*  MG  --   --   --  2.5*  --    GFR: Estimated Creatinine  Clearance: 74.4 mL/min (by C-G formula based on SCr of 1.15 mg/dL). Liver Function Tests: Recent Labs  Lab 04/08/19 2226 04/09/19 0445 04/10/19 0610 04/11/19 0535 04/12/19 0305  AST 76* 67* 60* 52* 56*  ALT 63* 58* 55* 50* 49*  ALKPHOS 103 89 112 138* 171*  BILITOT 0.6 0.5 1.0 0.7 0.8  PROT 7.9 7.1 7.8 7.9 7.0  ALBUMIN 3.2* 2.9* 3.4* 3.4* 3.1*   No results for input(s): LIPASE, AMYLASE in the last 168 hours. No results for input(s): AMMONIA in the last 168 hours. Coagulation Profile: No results for input(s): INR, PROTIME in the last 168 hours. Cardiac Enzymes: No results for input(s): CKTOTAL, CKMB, CKMBINDEX, TROPONINI in the last 168 hours. BNP (last 3 results) No results for input(s): PROBNP in the last 8760 hours. HbA1C: No results for input(s): HGBA1C in the last 72 hours. CBG: Recent Labs  Lab 04/11/19 0726 04/11/19 1220 04/11/19 1622 04/11/19 1952 04/12/19 [id number]  GLUCAP 200* 223* 190* 185* 123*   Lipid Profile: No results for input(s): CHOL, HDL, LDLCALC, TRIG, CHOLHDL, LDLDIRECT in the last 72 hours. Thyroid Function Tests: No results for input(s): TSH, T4TOTAL, FREET4, T3FREE, THYROIDAB in the last 72 hours. Anemia Panel: Recent Labs    04/11/19 0535 04/12/19 0305  FERRITIN 570* 479*      Radiology Studies: I have reviewed all of the imaging during this hospital visit personally     Scheduled Meds: . aspirin  81 mg Oral Daily  . atorvastatin  40 mg Oral q1800  . dexamethasone (DECADRON) injection  6 mg Intravenous Q24H  . enoxaparin (LOVENOX) injection  100 mg Subcutaneous Q12H  . famotidine  20 mg Oral BID  . insulin aspart  0-9 Units Subcutaneous TID WC  . Ipratropium-Albuterol  1 puff Inhalation Q6H  . LORazepam  0.5 mg Intravenous Once  . tamsulosin  0.4 mg Oral QPC supper  . ursodiol  300 mg Oral BID  . vitamin C  500 mg Oral Daily  . zinc sulfate  220 mg Oral Daily   Continuous Infusions: . remdesivir 100 mg in NS 250 mL 100 mg  (04/12/19 0830)     LOS: 3 days        Jewelz Kobus Gerome Apley, MD

## 2019-04-12 NOTE — Discharge Instructions (Addendum)

## 2019-04-12 NOTE — Progress Notes (Signed)
Late Entry for 04/11/19 @ 1935: Patient seen and assessed. Physical assessment completed via computerized charting per [facility] policy. Incentive spironmeter of 1500 ml noted. Will continue to encourage pulmonary toilet. Concern noted with patient second to spouse in [facility] ED with shortness of breath.

## 2019-04-12 NOTE — Evaluation (Signed)
Physical Therapy Evaluation Patient Details Name: [name] MRN: [id number] DOB: [birth year] Today's Date: 04/12/2019   History of Present Illness  63 y/o male w/ hx liver disorder, HTN, HLD, R BKA, GERD presented to ED with SOB, cough, fever and chills, admitted with dx acute hypoxic resp failure sec to SARS COVID 19.  Clinical Impression   Pt admitted with above diagnosis. 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 Pt does have R BKA and has been ambulatory with no AD using his prosthetic leg. Pt currently with functional limitations due to the deficits listed below (see PT Problem List). Pt presents with decreased overall strength, independence and activity tolerance. Duirng assessment pt was on NRB on 15L/min, pt managed to maintain sats in 90s most of time, after ambulation monitor was reading 83% pt cued to complete pursed lip breathing, probe had been moved from earlobe to finger once dislodged once probe was once again moved to earlobe, sats reading in 90s again. Pt will benefit from skilled PT to increase his strength, activity tolerance, independence and safety with mobility to allow discharge to the venue listed below.       Follow Up Recommendations No PT follow up    Equipment Recommendations  None recommended by PT    Recommendations for Other Services       Precautions / Restrictions Precautions Precautions: Fall Precaution Comments: R BKA Restrictions Weight Bearing Restrictions: No      Mobility  Bed Mobility Overal bed mobility: Modified Independent                Transfers Overall transfer level: Needs assistance Equipment used: None Transfers: Sit to/from Stand Sit to Stand: Supervision            Ambulation/Gait Ambulation/Gait assistance: Supervision Gait Distance (Feet): 60 Feet Assistive device: None Gait Pattern/deviations: Wide base of support     General Gait Details: Pt able to donn prosthesis independently and  ambulates in room with SBA and cues  Stairs            Wheelchair Mobility    Modified Rankin (Stroke Patients Only)       Balance Overall balance assessment: Needs assistance Sitting-balance support: Feet unsupported Sitting balance-Leahy Scale: Normal Sitting balance - Comments: able to sit unsupported EOB and donn prosthetic leg Postural control: (good) Standing balance support: During functional activity Standing balance-Leahy Scale: Fair Standing balance comment: some staggering noted initially with ambulation                              Pertinent Vitals/Pain Pain Assessment: No/denies pain    Home Living Family/patient expects to be discharged to:: Private residence Living Arrangements: Spouse/significant other Available Help at Discharge: Family Type of Home: House Home Access: Stairs to enter Entrance Stairs-Rails: Left Entrance Stairs-Number of Steps: 4-5 Home Layout: One level Home Equipment: Shower seat - built in;Grab bars - tub/shower      Prior Function Level of Independence: Independent               Hand Dominance        Extremity/Trunk Assessment                Communication   Communication: No difficulties  Cognition Arousal/Alertness: Awake/alert Behavior During Therapy: WFL for tasks assessed/performed Overall Cognitive Status: Within Functional Limits for tasks assessed  General Comments General comments (skin integrity, edema, etc.): Pt found on NRB at 15L/min states that was not gettig enough 02 via [place] sec to nose being stopped up hence moved to NRB, on NRB sats in 90s with exception of ambulation desat to 83% on monitor, initially pt had earlobe probe but probe had become dislodged hence was using pedi finger probe, once probe returned to earlobe pt sats reading in 90s again.    Exercises Other Exercises Other Exercises: reinforced use of incentive  spirometer and flutter valve   Assessment/Plan    PT Assessment    PT Problem List         PT Treatment Interventions      PT Goals (Current goals can be found in the Care Plan section)       Frequency Min 3X/week   Barriers to discharge        Co-evaluation               AM-PAC PT "6 Clicks" Mobility  Outcome Measure Help needed turning from your back to your side while in a flat bed without using bedrails?: None Help needed moving from lying on your back to sitting on the side of a flat bed without using bedrails?: None Help needed moving to and from a bed to a chair (including a wheelchair)?: A Little Help needed standing up from a chair using your arms (e.g., wheelchair or bedside chair)?: None Help needed to walk in hospital room?: A Little Help needed climbing 3-5 steps with a railing? : A Little 6 Click Score: 21    End of Session Equipment Utilized During Treatment: Oxygen Activity Tolerance: Patient tolerated treatment well Patient left: in chair;with call bell/phone within reach   PT Visit Diagnosis: Muscle weakness (generalized) (M62.81);Other abnormalities of gait and mobility (R26.89)    Time: 1510-1537 PT Time Calculation (min) (ACUTE ONLY): 27 min   Charges:   PT Evaluation $PT Eval Moderate Complexity: Hiko, PT   [name] 04/12/2019, 3:51 PM

## 2019-04-13 ENCOUNTER — Inpatient Hospital Stay (HOSPITAL_COMMUNITY): Payer: PRIVATE HEALTH INSURANCE

## 2019-04-13 LAB — CULTURE, BLOOD (ROUTINE X 2)
Culture: NO GROWTH
Culture: NO GROWTH
Special Requests: ADEQUATE
Special Requests: ADEQUATE

## 2019-04-13 LAB — C-REACTIVE PROTEIN: CRP: 5.1 mg/dL — ABNORMAL HIGH (ref <1.0)

## 2019-04-13 LAB — FERRITIN: Ferritin: 428 ng/mL — ABNORMAL HIGH (ref 24–336)

## 2019-04-13 LAB — GLUCOSE, CAPILLARY
Glucose-Capillary: 115 mg/dL — ABNORMAL HIGH (ref 70–99)
Glucose-Capillary: 157 mg/dL — ABNORMAL HIGH (ref 70–99)
Glucose-Capillary: 191 mg/dL — ABNORMAL HIGH (ref 70–99)
Glucose-Capillary: 123 mg/dL — ABNORMAL HIGH (ref 70–99)

## 2019-04-13 LAB — CBC
HCT: 46.9 % (ref 39.0–52.0)
Hemoglobin: 15.5 g/dL (ref 13.0–17.0)
MCH: 30.8 pg (ref 26.0–34.0)
MCHC: 33 g/dL (ref 30.0–36.0)
MCV: 93.1 fL (ref 80.0–100.0)
Platelets: 256 10*3/uL (ref 150–400)
RBC: 5.04 MIL/uL (ref 4.22–5.81)
RDW: 13.2 % (ref 11.5–15.5)
WBC: 21 10*3/uL — ABNORMAL HIGH (ref 4.0–10.5)
nRBC: 0.8 % — ABNORMAL HIGH (ref 0.0–0.2)

## 2019-04-13 LAB — D-DIMER, QUANTITATIVE: D-Dimer, Quant: >20 ug/mL-FEU — ABNORMAL HIGH (ref 0.00–0.50)

## 2019-04-13 MED ORDER — FUROSEMIDE 10 MG/ML IJ SOLN
60.0000 mg | Freq: Once | INTRAMUSCULAR | Status: AC
  Administered 2019-04-13: 16:00:00 60 mg via INTRAVENOUS
  Filled 2019-04-13: qty 6

## 2019-04-13 MED ORDER — OXYMETAZOLINE HCL 0.05 % NA SOLN
1.0000 | Freq: Two times a day (BID) | NASAL | Status: AC
  Administered 2019-04-13 – 2019-04-15 (×6): 1 via NASAL
  Filled 2019-04-13: qty 15

## 2019-04-13 NOTE — Progress Notes (Addendum)
PROGRESS NOTE    [name]  [id number] DOB: Dec 07, 1955 DOA: 04/08/2019 PCP: [name], FNP    Brief Narrative:  63 year old male who presented with dyspnea, he does have significant past medical history for hypertension, dyslipidemia and right BKA. Patient reported 4 days of shortness of breath, associated with productive cough, fevers, andchills.He was treated as an outpatient with prednisone and oral antibiotics but due to persistent symptoms he came to the hospital for further evaluation. His initial physical examination his blood pressure was 115/77, pulse rate 98, respiratory 21, oxygen saturation 88%. His lungs had no rhonchi or rails, heart S1-S2 present rhythmic, abdomen soft, no lower extremity edema. SARS COVID-19 was positive. Chest radiograph(personally reviewed), faint infiltrate left upper lobe interstitial.  Patient was admitted to the hospital working diagnosis of acute hypoxic respiratory failure due to SARS COVID-19 viral pneumonia.  Patient has been treated with systemic steroids and remdesivir. Due to persistent and worsening hypoxic respiratory failure he received Actemra and convalescent plasma.  His follow-up CT chest showed segmental and subsegmental pulmonary embolism within branches of the right lower lobe.  Patient continue to have a slow recovery, continue with high oxygen requirements.    Assessment & Plan:   Principal Problem:   Pneumonia due to COVID-19 virus Active Problems:   Acute respiratory failure with hypoxia (HCC)   Essential hypertension   HLD (hyperlipidemia)   Community acquired pneumonia   Transaminitis   Hyperglycemia   AKI (acute kidney injury) ([place])   Bilateral pulmonary embolism ([place])   1.Acute hypoxic respiratory failure due to SARS COVID-19 viral pneumonia. Sp convalescent plasma and actemra on 04/10/19.   RR: 20  Pulse oxymetry: 90% Fi02: 15 LPM per HFNC  COVID-19 Labs  Recent Labs    04/11/19  0535 04/12/19 0305 04/13/19 0135  DDIMER >20.00* >20.00* >20.00*  FERRITIN 570* 479* 428*  LDH 965*  --   --   CRP 14.7* 7.9* 5.1*    Lab Results  Component Value Date   SARSCOV2NAA POSITIVE (A) 04/08/2019   SARSCOV2NAA Detected (A) 04/04/2019    Inflammatory markers trending down except for D dimer that continues to be elevated. Patient continue to have very high oxygen requirements. Follow up chest film (personally reviewed), bilateral interstitial infiltrates, diffuse, predominantly at bases, positive fluid at the right fissure. Cephalization of the vasculature, changes suggestive of pulmonary edema, will trial furosemide IV, 60 mg Iv x1 and follow on urine output.   Patient will complete today Remdesivir #5/5, will continue with systemic corticosteroids with dexamethasone 6 mg Iv daily. Continue with antitussive agents, bronchodilators, and airway clearing techniques with flutter valve and incentive spirometer. Continue to encourage prone position.   Continue to be at high risk for worsening respiratory failure.   2. Acute pulmonary embolism. echocardiogram with preserved left and right systolic function. Transitioned to rivaroxaban with good toleration.   3. T2DM with steroid induced hyperglycemia. Continue with glucose cover and monitoring with insulin sliding scale.   4. Dyslipidemia. Continue with atorvastatin.  5. BPH. Continue with  flomax, no signs of urinary retention.   6. Obesity. Calculated BMI is 37,12,   7. 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 Positive volume overload, will order diuresis and will follow on renal panel in am.   8. Sinus congestion. Continue nasal fluticasone and saline nasal spry, will add afrin bid for 3 days.   No frank end organ failure due to infection, sepsis ruled out.   DVT prophylaxis:enoxaparin Code Status:full Family Communication:no family at the  bedside Disposition Plan/ discharge barriers:pending clinical improvement.   Body  mass index is 37.12 kg/m. Malnutrition Type:      Malnutrition Characteristics:      Nutrition Interventions:     RN Pressure Injury Documentation:     Consultants:     Procedures:     Antimicrobials:       Subjective: Patient continue to have significant dyspnea with minimal efforts, continue to have high oxygen requirements. Positive nasal congestion. Had rapid response this am due to hypoxemia.   Objective: Vitals:   04/12/19 1554 04/12/19 1932 04/13/19 0506 04/13/19 0736  BP: (!) 143/87 (!) 143/90 124/74 (!) 146/93  Pulse: 63 86 96 95  Resp: 20 18 16 20   Temp: 97.9 F (36.6 C) 97.9 F (36.6 C) 97.6 F (36.4 C) 97.8 F (36.6 C)  TempSrc: Oral Oral Oral Oral  SpO2: 100% 93% (!) 89% 90%  Weight:      Height:        Intake/Output Summary (Last 24 hours) at 04/13/2019 1005 Last data filed at 04/13/2019 0900 Gross per 24 hour  Intake 240 ml  Output 200 ml  Net 40 ml   Filed Weights   04/09/19 0300  Weight: 104.3 kg    Examination:   General: Not in pain, positive dyspnea at rest, deconditioned  Neurology: Awake and alert, non focal  E ENT: no pallor, no icterus, oral mucosa moist Cardiovascular: No JVD. S1-S2 present, rhythmic, no gallops, rubs, or murmurs. No lower extremity edema. Pulmonary: positive breath sounds bilaterally, with no wheezing, rhonchi or rales. Gastrointestinal. Abdomen with no organomegaly, non tender, no rebound or guarding Skin. No rashes Musculoskeletal: right BKA.      Data Reviewed: I have personally reviewed following labs and imaging studies  CBC: Recent Labs  Lab 04/08/19 2226 04/09/19 0557 04/10/19 0800 04/11/19 0535 04/13/19 0135  WBC 12.4* 13.9* 14.4* 16.0* 21.0*  NEUTROABS 11.5* 12.4*  --   --   --   HGB 15.7 14.8 15.4 16.1 15.5  HCT 46.4 43.4 45.7 48.2 46.9  MCV 91.5 92.7 94.2 92.3 93.1  PLT 201 204 176 197 [id number]   Basic Metabolic Panel: Recent Labs  Lab 04/09/19 0445 04/10/19 0610  04/11/19 0535 04/12/19 0305 04/12/19 1040  NA 139 140 138 139 140  K 3.9 3.9 4.4 4.3 4.5  CL 101 98 98 100 102  CO2 23 28 25 23 25   GLUCOSE 175* 130* 180* 150* 176*  BUN 33* 25* 30* 29* 29*  CREATININE 1.39* 1.00 1.03 1.15 1.20  CALCIUM 7.9* 8.4* 8.7* 8.6* 8.0*  MG  --   --  2.5*  --   --    GFR: Estimated Creatinine Clearance: 71.3 mL/min (by C-G formula based on SCr of 1.2 mg/dL). Liver Function Tests: Recent Labs  Lab 04/09/19 0445 04/10/19 0610 04/11/19 0535 04/12/19 0305 04/12/19 1040  AST 67* 60* 52* 56* 62*  ALT 58* 55* 50* 49* 48*  ALKPHOS 89 112 138* 171* 162*  BILITOT 0.5 1.0 0.7 0.8 1.6*  PROT 7.1 7.8 7.9 7.0 6.6  ALBUMIN 2.9* 3.4* 3.4* 3.1* 3.1*   No results for input(s): LIPASE, AMYLASE in the last 168 hours. No results for input(s): AMMONIA in the last 168 hours. Coagulation Profile: No results for input(s): INR, PROTIME in the last 168 hours. Cardiac Enzymes: No results for input(s): CKTOTAL, CKMB, CKMBINDEX, TROPONINI in the last 168 hours. BNP (last 3 results) No results for input(s): PROBNP in the last 8760 hours. HbA1C: No  results for input(s): HGBA1C in the last 72 hours. CBG: Recent Labs  Lab 04/12/19 0811 04/12/19 1119 04/12/19 1632 04/12/19 2254 04/13/19 0735  GLUCAP 123* 151* 199* 117* 115*   Lipid Profile: No results for input(s): CHOL, HDL, LDLCALC, TRIG, CHOLHDL, LDLDIRECT in the last 72 hours. Thyroid Function Tests: No results for input(s): TSH, T4TOTAL, FREET4, T3FREE, THYROIDAB in the last 72 hours. Anemia Panel: Recent Labs    04/12/19 0305 04/13/19 0135  FERRITIN 479* 428*      Radiology Studies: I have reviewed all of the imaging during this hospital visit personally     Scheduled Meds: . aspirin  81 mg Oral Daily  . atorvastatin  40 mg Oral q1800  . dexamethasone (DECADRON) injection  6 mg Intravenous Q24H  . famotidine  20 mg Oral BID  . fluticasone  2 spray Each Nare BID  . insulin aspart  0-9 Units  Subcutaneous TID WC  . Ipratropium-Albuterol  1 puff Inhalation Q6H  . LORazepam  0.5 mg Intravenous Once  . Rivaroxaban  15 mg Oral BID WC  . [START ON 05/03/2019] rivaroxaban  20 mg Oral Q supper  . tamsulosin  0.4 mg Oral QPC supper  . ursodiol  300 mg Oral BID  . vitamin C  500 mg Oral Daily  . zinc sulfate  220 mg Oral Daily   Continuous Infusions: . remdesivir 100 mg in NS 250 mL 100 mg (04/12/19 0830)     LOS: 4 days        Mauricio Gerome Apley, MD

## 2019-04-13 NOTE — Progress Notes (Signed)
Occupational Therapy Evaluation Patient Details Name: [name] MRN: [id number] DOB: 1956/02/13 Today's Date: 04/13/2019    History of Present Illness 63 y/o male w/ hx liver disorder, HTN, HLD, R BKA, GERD presented to ED with SOB, cough, fever and chills, admitted with dx acute hypoxic resp failure sec to SARS COVID 19.   Clinical Impression   PTA, pt working as a PA at [employer] and was independent with mobility without use of an AD. Pt on 25 NRB due to not being able to breath through his nose. Educated pt on importance of proning in addition to using incentive spirometer and flutter valve. Pt S with limited mobility and overall set up for ADL. Note desat into 54s with exertion when NRB removed with 2/4 DOE. Pt may need to use RW to assist with mobility. Pt expressing feelings of anxiety regarding the course of the virus, the affect on his work and how his wife is doing (pending admit to [facility] as well). Will most likely benefit form education on anxiety management. Will further assess. Will follow acutely.     Follow Up Recommendations  No OT follow up;Supervision - Intermittent    Equipment Recommendations  Other (comment)(May need RW)    Recommendations for Other Services       Precautions / Restrictions Precautions Precautions: Fall Precaution Comments: R BKA Restrictions Weight Bearing Restrictions: No      Mobility Bed Mobility               General bed mobility comments: OOB in chair  Transfers Overall transfer level: Needs assistance Equipment used: None Transfers: Sit to/from Stand Sit to Stand: Supervision              Balance Overall balance assessment: Needs assistance Sitting-balance support: Feet unsupported Sitting balance-Leahy Scale: Normal Sitting balance - Comments: able to sit unsupported EOB and donn prosthetic leg Postural control: (good) Standing balance support: During functional activity Standing balance-Leahy Scale:  Fair Standing balance comment: able to stand unsupported                           ADL either performed or assessed with clinical judgement   ADL Overall ADL's : Needs assistance/impaired                                     Functional mobility during ADLs: Supervision/safety(stand pivot only) General ADL Comments: Pt overall set up for ADL; easily fatigues; may need to use RW with mobility; limited mobility due to pt requesting not to walk given his need to use NRB due to mouth breathing /stuffy nose; pt states he either hops into the bathroom on 1 leg without use of AD or crawls     Vision         Perception     Praxis      Pertinent Vitals/Pain Pain Assessment: No/denies pain     Hand Dominance     Extremity/Trunk Assessment Upper Extremity Assessment Upper Extremity Assessment: Overall WFL for tasks assessed   Lower Extremity Assessment Lower Extremity Assessment: Defer to PT evaluation   Cervical / Trunk Assessment Cervical / Trunk Assessment: Normal   Communication Communication Communication: No difficulties   Cognition Arousal/Alertness: Awake/alert Behavior During Therapy: WFL for tasks assessed/performed Overall Cognitive Status: Within Functional Limits for tasks assessed  General Comments       Exercises Exercises: Other exercises Other Exercises Other Exercises: reinforced use of incentive spirometer and flutter valve Other Exercises: pursed lip breathing Other Exercises: began educaiton on level 3 theraband  Sit  - stand x 10; encouraged pt ot complete these exercises several times a day   Shoulder Instructions      Home Living Family/patient expects to be discharged to:: Private residence Living Arrangements: Spouse/significant other Available Help at Discharge: Family Type of Home: House Home Access: Stairs to enter Technical brewer of Steps: 4-5 Entrance  Stairs-Rails: Left Home Layout: One level     Bathroom Shower/Tub: Occupational psychologist: Standard Bathroom Accessibility: Yes   Home Equipment: Shower seat - built in;Grab bars - tub/shower          Prior Functioning/Environment Level of Independence: Independent;Independent with assistive device(s)        Comments: uses a prosthetic; has used prosthetic since birth        OT Problem List: Decreased strength;Decreased activity tolerance;Decreased knowledge of use of DME or AE;Obesity;Cardiopulmonary status limiting activity      OT Treatment/Interventions: Self-care/ADL training;Therapeutic exercise;Neuromuscular education;Energy conservation;DME and/or AE instruction;Therapeutic activities;Patient/family education;Balance training    OT Goals(Current goals can be found in the care plan section) Acute Rehab OT Goals Patient Stated Goal: to get better and go home OT Goal Formulation: With patient Time For Goal Achievement: 04/27/19 Potential to Achieve Goals: Good ADL Goals Pt Will Perform Lower Body Bathing: with modified independence;sit to/from stand Pt Will Perform Lower Body Dressing: with modified independence;sit to/from stand Pt Will Perform Toileting - Clothing Manipulation and hygiene: with modified independence Pt Will Perform Tub/Shower Transfer: with modified independence;ambulating;Shower transfer;3 in 1 Pt/caregiver will Perform Home Exercise Program: Independently;With theraband;Increased strength;Both right and left upper extremity;With written HEP provided Additional ADL Goal #1: Pt will independently verbalize 3 strategies to reduce risk of falls  OT Frequency: Min 3X/week   Barriers to D/C:            Co-evaluation              AM-PAC OT "6 Clicks" Daily Activity     Outcome Measure Help from another person eating meals?: None Help from another person taking care of personal grooming?: A Little Help from another person  toileting, which includes using toliet, bedpan, or urinal?: A Little Help from another person bathing (including washing, rinsing, drying)?: A Little Help from another person to put on and taking off regular upper body clothing?: A Little Help from another person to put on and taking off regular lower body clothing?: A Little 6 Click Score: 19   End of Session Equipment Utilized During Treatment: Oxygen(15L NRB) Nurse Communication: Mobility status  Activity Tolerance: Patient tolerated treatment well Patient left: in chair;with call bell/phone within reach  OT Visit Diagnosis: Unsteadiness on feet (R26.81);Muscle weakness (generalized) (M62.81)                Time: [id number] OT Time Calculation (min): 35 min Charges:  OT General Charges $OT Visit: 1 Visit OT Evaluation $OT Eval Moderate Complexity: 1 Mod OT Treatments $Self Care/Home Management : 8-22 mins  Maurie Boettcher, OT/L   Acute OT Clinical Specialist [place] Pager [phone] Office [phone]   [facility] 04/13/2019, 5:46 PM

## 2019-04-13 NOTE — Progress Notes (Signed)
Rapid Response called d/t sats in 60's with 15 L HFNC and NRB. ST SBP>100. Pt not in distress and Able to hold a conversation. Lungs clear and diminished. Sat probe location changed to ear. Education on breathing techniques. Sats improved to 90-100. Pt's RN will continue to monitor. No further action taken at this time.

## 2019-04-13 NOTE — Progress Notes (Signed)
Pt declined notification of wife at this time as she is sick in the ED at [facility].

## 2019-04-14 LAB — BASIC METABOLIC PANEL
Anion gap: 13 (ref 5–15)
BUN: 26 mg/dL — ABNORMAL HIGH (ref 8–23)
CO2: 25 mmol/L (ref 22–32)
Calcium: 8.6 mg/dL — ABNORMAL LOW (ref 8.9–10.3)
Chloride: 103 mmol/L (ref 98–111)
Creatinine, Ser: 1.02 mg/dL (ref 0.61–1.24)
GFR calc Af Amer: >60 mL/min (ref >60)
GFR calc non Af Amer: >60 mL/min (ref >60)
Glucose, Bld: 150 mg/dL — ABNORMAL HIGH (ref 70–99)
Potassium: 4.1 mmol/L (ref 3.5–5.1)
Sodium: 141 mmol/L (ref 135–145)

## 2019-04-14 LAB — D-DIMER, QUANTITATIVE: D-Dimer, Quant: >20 ug/mL-FEU — ABNORMAL HIGH (ref 0.00–0.50)

## 2019-04-14 LAB — C-REACTIVE PROTEIN: CRP: 3.4 mg/dL — ABNORMAL HIGH (ref <1.0)

## 2019-04-14 LAB — GLUCOSE, CAPILLARY
Glucose-Capillary: 153 mg/dL — ABNORMAL HIGH (ref 70–99)
Glucose-Capillary: 242 mg/dL — ABNORMAL HIGH (ref 70–99)
Glucose-Capillary: 164 mg/dL — ABNORMAL HIGH (ref 70–99)
Glucose-Capillary: 141 mg/dL — ABNORMAL HIGH (ref 70–99)

## 2019-04-14 LAB — FERRITIN: Ferritin: 326 ng/mL (ref 24–336)

## 2019-04-14 MED ORDER — GUAIFENESIN-DM 100-10 MG/5ML PO SYRP
10.0000 mL | ORAL_SOLUTION | ORAL | Status: DC | PRN
  Administered 2019-04-19 – 2019-04-21 (×3): 10 mL via ORAL
  Filled 2019-04-14 (×3): qty 10

## 2019-04-14 MED ORDER — HYDROCOD POLST-CPM POLST ER 10-8 MG/5ML PO SUER: 5.0000 mL | Freq: Two times a day (BID) | ORAL | Status: DC

## 2019-04-14 MED ORDER — HYDROCOD POLST-CPM POLST ER 10-8 MG/5ML PO SUER
5.0000 mL | Freq: Two times a day (BID) | ORAL | Status: DC
  Administered 2019-04-14 – 2019-05-07 (×47): 5 mL via ORAL
  Filled 2019-04-14 (×47): qty 5

## 2019-04-14 NOTE — Progress Notes (Signed)
PROGRESS NOTE    LYNDA MORALE  [id number] DOB: 23-Nov-1955 DOA: 04/08/2019 PCP: [name], FNP    Brief Narrative:  63 year old male who presented with dyspnea, he does have significant past medical history for hypertension, dyslipidemia and right BKA. Patient reported 4 days of shortness of breath, associated with productive cough, fevers, andchills.He was treated as an outpatient with prednisone and oral antibiotics but due to persistent symptoms he came to the hospital for further evaluation. His initial physical examination his blood pressure was 115/77, pulse rate 98, respiratory 21, oxygen saturation 88%. His lungs had no rhonchi or rails, heart S1-S2 present rhythmic, abdomen soft, no lower extremity edema. SARS COVID-19 was positive. Chest radiograph(personally reviewed), faint infiltrate left upper lobe interstitial.  Patient was admitted to the hospital working diagnosis of acute hypoxic respiratory failure due to SARS COVID-19 viral pneumonia.  Patient has been treated with systemic steroids and remdesivir. Due to persistent and worsening hypoxic respiratory failure he received Actemra and convalescent plasma.  His follow-up CT chest showed segmental and subsegmental pulmonary embolism within branches of the right lower lobe.  Patient continue to have a slow recovery, continue with high oxygen requirements.  Follow up chest radiograph from 11/20 with positive signs of pulmonary edema diuresed with furosemide.    Assessment & Plan:   Principal Problem:   Pneumonia due to COVID-19 virus Active Problems:   Acute respiratory failure with hypoxia (HCC)   Essential hypertension   HLD (hyperlipidemia)   Community acquired pneumonia   Transaminitis   Hyperglycemia   AKI (acute kidney injury) ([place])   Bilateral pulmonary embolism ([place])   1.Acute hypoxic respiratory failure due to SARS COVID-19 viral pneumonia. Sp convalescent plasma and actemra on  04/10/19.Completed 5 doses of Remdesivir 11/20.   RR: 18  Pulse oxymetry: 94 to 100 Fi02: non rebreather mask   COVID-19 Labs  Recent Labs    04/12/19 0305 04/13/19 0135 04/14/19 0135  DDIMER >20.00* >20.00* >20.00*  FERRITIN 479* 428* 326  CRP 7.9* 5.1* 3.4*    Lab Results  Component Value Date   SARSCOV2NAA POSITIVE (A) 04/08/2019   SARSCOV2NAA Detected (A) 04/04/2019     Continue Inflammatory markers trending down except for D dimer. Continue with very high oxygen requirements. Patient sp diuresis with furosemide 60 mg IV, for non cardiogenic pulmonary edema, with urine output 2,650 ml.   Will continue with systemic corticosteroids with dexamethasone 6 mg Iv daily #6. On antitussive agents, bronchodilators, and airway clearing techniques with flutter valve and incentive spirometer. Encourage prone position.   Continue with saline nasal spry, flonase and afrin, for nasal congestion.   Patient is at high risk for worsening respiratory failure.   2. Acute pulmonary embolism. echocardiogram with preserved left and right systolic function. Continue anticoagulation with rivaroxaban with good toleration.   3. T2DM with steroid induced hyperglycemia.Fasting glucose is 150, will continue with glucose cover and monitoring with insulin sliding scale.  4. Dyslipidemia.On atorvastatin.  5. BPH.Onflomax, with no signs of urinary retention.   6. Obesity.  BMI is 37,12,   7. 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 Sp diuresis, renal function with stabe cr at 1,0, K at 4,1 and serum bicarbonate at 26, will hold on further diuresis for now.  No frank end organ failure due to infection, sepsis ruled out.  DVT prophylaxis:enoxaparin Code Status:full Family Communication:no family at the bedside Disposition Plan/ discharge barriers:pending clinical improvement.   Body mass index is 37.12 kg/m. Malnutrition Type:  Malnutrition Characteristics:      Nutrition  Interventions:     RN Pressure Injury Documentation:     Consultants:     Procedures:     Antimicrobials:       Subjective: Patient continue to have dyspnea and high oxygen requirements, received furosemide yesterday with good toleration. Today with increased productive cough. Improved nasal congestion.   Objective: Vitals:   04/13/19 1545 04/13/19 2025 04/14/19 0020 04/14/19 0525  BP: (!) 124/92 108/77 110/64 129/80  Pulse: 94 84 85 66  Resp: 18   18  Temp: 98.5 F (36.9 C) 98.1 F (36.7 C) 98.2 F (36.8 C) 98.2 F (36.8 C)  TempSrc: Oral Oral Oral Oral  SpO2: 97% 100% 100% 94%  Weight:      Height:        Intake/Output Summary (Last 24 hours) at 04/14/2019 0735 Last data filed at 04/14/2019 0300 Gross per 24 hour  Intake 960 ml  Output 2650 ml  Net -1690 ml   Filed Weights   04/09/19 0300  Weight: 104.3 kg    Examination:   General: deconditioned and ill looking appearing  Neurology: Awake and alert, non focal  E ENT: positive pallor, no icterus, oral mucosa moist Cardiovascular: No JVD. S1-S2 present, rhythmic, no gallops, rubs, or murmurs. No lower extremity edema. Pulmonary: positive breath sounds bilaterally.  Gastrointestinal. Abdomen with no organomegaly, non tender, no rebound or guarding Skin. No rashes Musculoskeletal: no joint deformities     Data Reviewed: I have personally reviewed following labs and imaging studies  CBC: Recent Labs  Lab 04/08/19 2226 04/09/19 0557 04/10/19 0800 04/11/19 0535 04/13/19 0135  WBC 12.4* 13.9* 14.4* 16.0* 21.0*  NEUTROABS 11.5* 12.4*  --   --   --   HGB 15.7 14.8 15.4 16.1 15.5  HCT 46.4 43.4 45.7 48.2 46.9  MCV 91.5 92.7 94.2 92.3 93.1  PLT 201 204 176 197 [id number]   Basic Metabolic Panel: Recent Labs  Lab 04/10/19 0610 04/11/19 0535 04/12/19 0305 04/12/19 1040 04/14/19 0135  NA 140 138 139 140 141  K 3.9 4.4 4.3 4.5 4.1  CL 98 98 100 102 103  CO2 28 25 23 25 25   GLUCOSE 130* 180*  150* 176* 150*  BUN 25* 30* 29* 29* 26*  CREATININE 1.00 1.03 1.15 1.20 1.02  CALCIUM 8.4* 8.7* 8.6* 8.0* 8.6*  MG  --  2.5*  --   --   --    GFR: Estimated Creatinine Clearance: 83.9 mL/min (by C-G formula based on SCr of 1.02 mg/dL). Liver Function Tests: Recent Labs  Lab 04/09/19 0445 04/10/19 0610 04/11/19 0535 04/12/19 0305 04/12/19 1040  AST 67* 60* 52* 56* 62*  ALT 58* 55* 50* 49* 48*  ALKPHOS 89 112 138* 171* 162*  BILITOT 0.5 1.0 0.7 0.8 1.6*  PROT 7.1 7.8 7.9 7.0 6.6  ALBUMIN 2.9* 3.4* 3.4* 3.1* 3.1*   No results for input(s): LIPASE, AMYLASE in the last 168 hours. No results for input(s): AMMONIA in the last 168 hours. Coagulation Profile: No results for input(s): INR, PROTIME in the last 168 hours. Cardiac Enzymes: No results for input(s): CKTOTAL, CKMB, CKMBINDEX, TROPONINI in the last 168 hours. BNP (last 3 results) No results for input(s): PROBNP in the last 8760 hours. HbA1C: No results for input(s): HGBA1C in the last 72 hours. CBG: Recent Labs  Lab 04/12/19 2254 04/13/19 0735 04/13/19 1124 04/13/19 1549 04/13/19 2118  GLUCAP 117* 115* 157* 191* 123*   Lipid Profile: No  results for input(s): CHOL, HDL, LDLCALC, TRIG, CHOLHDL, LDLDIRECT in the last 72 hours. Thyroid Function Tests: No results for input(s): TSH, T4TOTAL, FREET4, T3FREE, THYROIDAB in the last 72 hours. Anemia Panel: Recent Labs    04/13/19 0135 04/14/19 0135  FERRITIN 428* 326      Radiology Studies: I have reviewed all of the imaging during this hospital visit personally     Scheduled Meds: . aspirin  81 mg Oral Daily  . atorvastatin  40 mg Oral q1800  . dexamethasone (DECADRON) injection  6 mg Intravenous Q24H  . famotidine  20 mg Oral BID  . fluticasone  2 spray Each Nare BID  . insulin aspart  0-9 Units Subcutaneous TID WC  . Ipratropium-Albuterol  1 puff Inhalation Q6H  . LORazepam  0.5 mg Intravenous Once  . oxymetazoline  1 spray Each Nare BID  .  Rivaroxaban  15 mg Oral BID WC  . [START ON 05/03/2019] rivaroxaban  20 mg Oral Q supper  . tamsulosin  0.4 mg Oral QPC supper  . ursodiol  300 mg Oral BID  . vitamin C  500 mg Oral Daily  . zinc sulfate  220 mg Oral Daily   Continuous Infusions:   LOS: 5 days         Gerome Apley, MD

## 2019-04-14 NOTE — Plan of Care (Addendum)
Patient currently on 15L HFNC/ Non-rebreather he tends to alternate between both. No s/s of pain or distress. All medications given well tolerated. Patient wife is hospitalized and unavailable for update.  Will continue to monitor for remainder of shift.  Problem: Education: Goal: Knowledge of risk factors and measures for prevention of condition will improve 04/14/2019 1407 by Orvan Falconer, RN Outcome: Progressing 04/14/2019 1407 by Orvan Falconer, RN Outcome: Progressing   Problem: Coping: Goal: Psychosocial and spiritual needs will be supported 04/14/2019 1407 by Orvan Falconer, RN Outcome: Progressing 04/14/2019 1407 by Orvan Falconer, RN Outcome: Progressing   Problem: Respiratory: Goal: Will maintain a patent airway 04/14/2019 1407 by Orvan Falconer, RN Outcome: Progressing 04/14/2019 1407 by Orvan Falconer, RN Outcome: Progressing Goal: Complications related to the disease process, condition or treatment will be avoided or minimized 04/14/2019 1407 by Orvan Falconer, RN Outcome: Progressing 04/14/2019 1407 by Orvan Falconer, RN Outcome: Progressing

## 2019-04-15 LAB — COMPREHENSIVE METABOLIC PANEL
ALT: 51 U/L — ABNORMAL HIGH (ref 0–44)
AST: 59 U/L — ABNORMAL HIGH (ref 15–41)
Albumin: 3.4 g/dL — ABNORMAL LOW (ref 3.5–5.0)
Alkaline Phosphatase: 221 U/L — ABNORMAL HIGH (ref 38–126)
Anion gap: 11 (ref 5–15)
BUN: 20 mg/dL (ref 8–23)
CO2: 26 mmol/L (ref 22–32)
Calcium: 8.4 mg/dL — ABNORMAL LOW (ref 8.9–10.3)
Chloride: 99 mmol/L (ref 98–111)
Creatinine, Ser: 1.02 mg/dL (ref 0.61–1.24)
GFR calc Af Amer: >60 mL/min (ref >60)
GFR calc non Af Amer: >60 mL/min (ref >60)
Glucose, Bld: 112 mg/dL — ABNORMAL HIGH (ref 70–99)
Potassium: 4.5 mmol/L (ref 3.5–5.1)
Sodium: 136 mmol/L (ref 135–145)
Total Bilirubin: 1.4 mg/dL — ABNORMAL HIGH (ref 0.3–1.2)
Total Protein: 6.8 g/dL (ref 6.5–8.1)

## 2019-04-15 LAB — GLUCOSE, CAPILLARY
Glucose-Capillary: 129 mg/dL — ABNORMAL HIGH (ref 70–99)
Glucose-Capillary: 193 mg/dL — ABNORMAL HIGH (ref 70–99)
Glucose-Capillary: 175 mg/dL — ABNORMAL HIGH (ref 70–99)

## 2019-04-15 LAB — D-DIMER, QUANTITATIVE: D-Dimer, Quant: >20 ug/mL-FEU — ABNORMAL HIGH (ref 0.00–0.50)

## 2019-04-15 LAB — C-REACTIVE PROTEIN: CRP: 3.7 mg/dL — ABNORMAL HIGH (ref <1.0)

## 2019-04-15 LAB — FERRITIN: Ferritin: 356 ng/mL — ABNORMAL HIGH (ref 24–336)

## 2019-04-15 NOTE — TOC Progression Note (Signed)
Transition of Care [facility]) - Progression Note    Patient Details  Name: LUKAH WASSERMAN MRN: [id number] Date of Birth: 03/04/1956  [facility] CM/SW Contact  Loletha Grayer Beverely Pace, RN Phone Number: 04/15/2019, 10:08 AM  Clinical Narrative:  Patient is 64 yr old male with Hx. Liver disorder, HTN, HLD, R BKA, GERD, presented to ED with shortness of breath, cough, fever and chills. Admitted with acute respiratory failure secondary to COVID 19. Patient remains on 15L HFNC alternating with non rebreather. Patient's wife is also hospitalized with COVID 12..  Case manager will continue to monitor for needs.                Expected Discharge Plan and Services                                                 Social Determinants of Health (SDOH) Interventions    Readmission Risk Interventions No flowsheet data found.

## 2019-04-15 NOTE — Progress Notes (Addendum)
PROGRESS NOTE    DINERO TLATELPA  [id number] DOB: [birth year] DOA: 04/08/2019 PCP: [name], FNP    Brief Narrative:  63 year old male who presented with dyspnea, he does have significant past medical history for hypertension, dyslipidemia and right BKA. Patient reported 4 days of shortness of breath, associated with productive cough, fevers, andchills.He was treated as an outpatient with prednisone and oral antibiotics but due to persistent symptoms he came to the hospital for further evaluation. His initial physical examination his blood pressure was 115/77, pulse rate 98, respiratory 21, oxygen saturation 88%. His lungs had no rhonchi or rails, heart S1-S2 present rhythmic, abdomen soft, no lower extremity edema. SARS COVID-19 was positive. Chest radiograph(personally reviewed), faint infiltrate left upper lobe interstitial.  Patient was admitted to the hospital working diagnosis of acute hypoxic respiratory failure due to SARS COVID-19 viral pneumonia.  Patient has been treated with systemic steroids and remdesivir. Due to persistent and worsening hypoxic respiratory failure he received Actemra and convalescent plasma.  His follow-up CT chest showed segmental and subsegmental pulmonary embolism within branches of the right lower lobe.  Patient continue to have a slow recovery, continue with high oxygen requirements.  Follow up chest radiograph from 11/20 with positive signs of pulmonary edema, diuresed with furosemide.   Assessment & Plan:   Principal Problem:   Pneumonia due to COVID-19 virus Active Problems:   Acute respiratory failure with hypoxia (HCC)   Essential hypertension   HLD (hyperlipidemia)   Community acquired pneumonia   Transaminitis   Hyperglycemia   AKI (acute kidney injury) ([place])   Bilateral pulmonary embolism ([place])   1.Acute hypoxic respiratory failure due to SARS COVID-19 viral pneumonia. Sp convalescent plasma and actemra on  04/10/19.Completed 5 doses of Remdesivir 11/20.   RR: 22 Pulse oxymetry: 91% Fi02: 15 LPM per non rebreather mask.  COVID-19 Labs  Recent Labs    04/13/19 0135 04/14/19 0135 04/15/19 0320  DDIMER >20.00* >20.00* >20.00*  FERRITIN 428* 326 356*  CRP 5.1* 3.4* 3.7*    Lab Results  Component Value Date   SARSCOV2NAA POSITIVE (A) 04/08/2019   SARSCOV2NAA Detected (A) 04/04/2019    Inflammatory markers continue trending down, except for D dimer. Fluid balance is negative 2,457 ml since admission, with urine output with 1000 ml over last 24 H. Will continue to taper down supplemental oxygen, with a target 02 saturation of 88%.   Plan to continue with systemic corticosteroids (dexamethasone 6 mg Iv daily #7/10). Continue with antitussive agents, bronchodilators, and airway clearing techniques with flutter valve and incentive spirometer. Continue to encourage prone position.   For nasal congestion continue with saline nasal spry and flonase, today he will complete 3 days of afrin.   Stable oxygen requirements over last 48 H will transfer patient to telemetry.   2. Acute pulmonary embolism. echocardiogram with preserved left and right systolic function. Tolerating well anticoagulation with rivaroxaban.  3. T2DM with steroid induced hyperglycemia.Fasting glucose today is 112, continue with insulin sliding scale, for glucose cover and monitoring.   4. Dyslipidemia.Continue with atorvastatin.  5. BPH. Continue with flomax.   6. Obesity. calculated BMI is 37,12,   7. AKI on CKD stage 1.Stable renal function with serum cr at 1,0, K at 4,5 and serum bicarbonate at 26.   No frank end organ failure due to infection, sepsis ruled out.  DVT prophylaxis:enoxaparin Code Status:full Family Communication:no family at the bedside Disposition Plan/ discharge barriers:pending clinical improvement.    Body mass index is 37.12 kg/m.  Malnutrition Type:       Malnutrition Characteristics:      Nutrition Interventions:     RN Pressure Injury Documentation:     Consultants:     Procedures:     Antimicrobials:       Subjective: Patient had epistaxis yesterday while on HFNC, and has continue on non rebreather mask. His dyspnea is stable, improved at rest but worse with exertion. No nausea or vomiting. Continue nasal congestion.   Objective: Vitals:   04/14/19 1935 04/14/19 2355 04/15/19 0425 04/15/19 0800  BP: 119/73 124/78 111/74 127/67  Pulse: 88 86 89 99  Resp: (!) 22 (!) 22 (!) 22 (!) 22  Temp: 98.4 F (36.9 C) 98.5 F (36.9 C) 98.2 F (36.8 C) 97.8 F (36.6 C)  TempSrc: Oral Oral Oral Axillary  SpO2: 93% 95% 97% 91%  Weight:      Height:        Intake/Output Summary (Last 24 hours) at 04/15/2019 0849 Last data filed at 04/15/2019 0400 Gross per 24 hour  Intake 240 ml  Output 700 ml  Net -460 ml   Filed Weights   04/09/19 0300  Weight: 104.3 kg    Examination:   General: deconditioned  Neurology: Awake and alert, non focal  E ENT: mild pallor, no icterus, oral mucosa moist Cardiovascular: No JVD. S1-S2 present, rhythmic, no gallops, rubs, or murmurs. No lower extremity edema. Pulmonary: positive breath sounds bilaterally. Gastrointestinal. Abdomen with no organomegaly, non tender, no rebound or guarding Skin. No rashes Musculoskeletal: no joint deformities     Data Reviewed: I have personally reviewed following labs and imaging studies  CBC: Recent Labs  Lab 04/08/19 2226 04/09/19 0557 04/10/19 0800 04/11/19 0535 04/13/19 0135  WBC 12.4* 13.9* 14.4* 16.0* 21.0*  NEUTROABS 11.5* 12.4*  --   --   --   HGB 15.7 14.8 15.4 16.1 15.5  HCT 46.4 43.4 45.7 48.2 46.9  MCV 91.5 92.7 94.2 92.3 93.1  PLT 201 204 176 197 [id number]   Basic Metabolic Panel: Recent Labs  Lab 04/11/19 0535 04/12/19 0305 04/12/19 1040 04/14/19 0135 04/15/19 0320  NA 138 139 140 141 136  K 4.4 4.3 4.5 4.1 4.5  CL  98 100 102 103 99  CO2 25 23 25 25 26   GLUCOSE 180* 150* 176* 150* 112*  BUN 30* 29* 29* 26* 20  CREATININE 1.03 1.15 1.20 1.02 1.02  CALCIUM 8.7* 8.6* 8.0* 8.6* 8.4*  MG 2.5*  --   --   --   --    GFR: Estimated Creatinine Clearance: 83.9 mL/min (by C-G formula based on SCr of 1.02 mg/dL). Liver Function Tests: Recent Labs  Lab 04/10/19 0610 04/11/19 0535 04/12/19 0305 04/12/19 1040 04/15/19 0320  AST 60* 52* 56* 62* 59*  ALT 55* 50* 49* 48* 51*  ALKPHOS 112 138* 171* 162* 221*  BILITOT 1.0 0.7 0.8 1.6* 1.4*  PROT 7.8 7.9 7.0 6.6 6.8  ALBUMIN 3.4* 3.4* 3.1* 3.1* 3.4*   No results for input(s): LIPASE, AMYLASE in the last 168 hours. No results for input(s): AMMONIA in the last 168 hours. Coagulation Profile: No results for input(s): INR, PROTIME in the last 168 hours. Cardiac Enzymes: No results for input(s): CKTOTAL, CKMB, CKMBINDEX, TROPONINI in the last 168 hours. BNP (last 3 results) No results for input(s): PROBNP in the last 8760 hours. HbA1C: No results for input(s): HGBA1C in the last 72 hours. CBG: Recent Labs  Lab 04/14/19 0805 04/14/19 1139 04/14/19 1703 04/14/19 2037  04/15/19 0752  GLUCAP 153* 242* 164* 141* 129*   Lipid Profile: No results for input(s): CHOL, HDL, LDLCALC, TRIG, CHOLHDL, LDLDIRECT in the last 72 hours. Thyroid Function Tests: No results for input(s): TSH, T4TOTAL, FREET4, T3FREE, THYROIDAB in the last 72 hours. Anemia Panel: Recent Labs    04/14/19 0135 04/15/19 0320  FERRITIN 326 356*      Radiology Studies: I have reviewed all of the imaging during this hospital visit personally     Scheduled Meds: . aspirin  81 mg Oral Daily  . atorvastatin  40 mg Oral q1800  . chlorpheniramine-HYDROcodone  5 mL Oral Q12H  . dexamethasone (DECADRON) injection  6 mg Intravenous Q24H  . famotidine  20 mg Oral BID  . fluticasone  2 spray Each Nare BID  . insulin aspart  0-9 Units Subcutaneous TID WC  . Ipratropium-Albuterol  1 puff  Inhalation Q6H  . LORazepam  0.5 mg Intravenous Once  . oxymetazoline  1 spray Each Nare BID  . Rivaroxaban  15 mg Oral BID WC  . [START ON 05/03/2019] rivaroxaban  20 mg Oral Q supper  . tamsulosin  0.4 mg Oral QPC supper  . ursodiol  300 mg Oral BID  . vitamin C  500 mg Oral Daily  . zinc sulfate  220 mg Oral Daily   Continuous Infusions:   LOS: 6 days        Mauricio Gerome Apley, MD

## 2019-04-15 NOTE — Progress Notes (Signed)
[place] for Xarelto Indication: pulmonary embolus   No Known Allergies  Patient Measurements: Height: 5\' 6"  (167.6 cm) Weight: 230 lb (104.3 kg) IBW/kg (Calculated) : 63.8  Vital Signs: Temp: 97.8 F (36.6 C) (11/22 0800) Temp Source: Axillary (11/22 0800) BP: 127/67 (11/22 0800) Pulse Rate: 99 (11/22 0800)  Labs: Recent Labs    04/13/19 0135 04/14/19 0135 04/15/19 0320  HGB 15.5  --   --   HCT 46.9  --   --   PLT 256  --   --   CREATININE  --  1.02 1.02    Estimated Creatinine Clearance: 83.9 mL/min (by C-G formula based on SCr of 1.02 mg/dL).   Medical History: Past Medical History:  Diagnosis Date  . GERD (gastroesophageal reflux disease)   . 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 Hyperlipidemia   . Hypertension   . Liver disorder    tx with ursodiol    Assessment: 63 yo M admitted with COVID-19 PNA and PE. Pharmacy consulted to dose Lovenox then transition to [place] on 11/19. His hgb has been stable 15.5 on 11/20, pltc wnl, scr 1.02, est.crcl ~ 80 -86ml/min. No bleeding noted per chart.   Plan:  Continue Xarelto 15 mg bid x 21 days followed by 20 mg daily thereafter Monitor CBC, renal function, s/s of bleeding  Thanks!   Maryanna Shape, PharmD, BCPS, [place] Clinical Pharmacist  Pager: [phone]   04/15/2019,1:11 PM

## 2019-04-16 LAB — C-REACTIVE PROTEIN: CRP: 4.4 mg/dL — ABNORMAL HIGH (ref <1.0)

## 2019-04-16 LAB — COMPREHENSIVE METABOLIC PANEL
ALT: 48 U/L — ABNORMAL HIGH (ref 0–44)
AST: 51 U/L — ABNORMAL HIGH (ref 15–41)
Albumin: 3.3 g/dL — ABNORMAL LOW (ref 3.5–5.0)
Alkaline Phosphatase: 259 U/L — ABNORMAL HIGH (ref 38–126)
Anion gap: 13 (ref 5–15)
BUN: 21 mg/dL (ref 8–23)
CO2: 27 mmol/L (ref 22–32)
Calcium: 8.8 mg/dL — ABNORMAL LOW (ref 8.9–10.3)
Chloride: 99 mmol/L (ref 98–111)
Creatinine, Ser: 1.05 mg/dL (ref 0.61–1.24)
GFR calc Af Amer: >60 mL/min (ref >60)
GFR calc non Af Amer: >60 mL/min (ref >60)
Glucose, Bld: 100 mg/dL — ABNORMAL HIGH (ref 70–99)
Potassium: 4.4 mmol/L (ref 3.5–5.1)
Sodium: 139 mmol/L (ref 135–145)
Total Bilirubin: 1.2 mg/dL (ref 0.3–1.2)
Total Protein: 6.6 g/dL (ref 6.5–8.1)

## 2019-04-16 LAB — GLUCOSE, CAPILLARY
Glucose-Capillary: 221 mg/dL — ABNORMAL HIGH (ref 70–99)
Glucose-Capillary: 142 mg/dL — ABNORMAL HIGH (ref 70–99)
Glucose-Capillary: 177 mg/dL — ABNORMAL HIGH (ref 70–99)
Glucose-Capillary: 224 mg/dL — ABNORMAL HIGH (ref 70–99)
Glucose-Capillary: 174 mg/dL — ABNORMAL HIGH (ref 70–99)

## 2019-04-16 LAB — D-DIMER, QUANTITATIVE: D-Dimer, Quant: >20 ug/mL-FEU — ABNORMAL HIGH (ref 0.00–0.50)

## 2019-04-16 LAB — FERRITIN: Ferritin: 263 ng/mL (ref 24–336)

## 2019-04-16 NOTE — Progress Notes (Addendum)
PROGRESS NOTE    [name]  [id number] DOB: Mar 06, 1956 DOA: 04/08/2019 PCP: [name], FNP    Brief Narrative:  63 year old male who presented with dyspnea, he does have significant past medical history for hypertension, dyslipidemia and right BKA. Patient reported 4 days of shortness of breath, associated with productive cough, fevers, andchills.He was treated as an outpatient with prednisone and oral antibiotics but due to persistent symptoms he came to the hospital for further evaluation. His initial physical examination his blood pressure was 115/77, pulse rate 98, respiratory 21, oxygen saturation 88%. His lungs had no rhonchi or rails, heart S1-S2 present rhythmic, abdomen soft, no lower extremity edema. SARS COVID-19 was positive. Chest radiograph(personally reviewed), faint infiltrate left upper lobe interstitial.  Patient was admitted to the hospital working diagnosis of acute hypoxic respiratory failure due to SARS COVID-19 viral pneumonia.  Patient has been treated with systemic steroids and remdesivir. Due to persistent and worsening hypoxic respiratory failure he received Actemra and convalescent plasma.  His follow-up CT chest showed segmental and subsegmental pulmonary embolism within branches of the right lower lobe.  Patient continue to have a slow recovery, continue with high oxygen requirements.  Follow up chest radiograph from 11/20 with positive signs of pulmonary edema, diuresed with furosemide. Achieved negative fluid balance, with no significant improvement of oxygenation.    Assessment & Plan:   Principal Problem:   Pneumonia due to COVID-19 virus Active Problems:   Acute respiratory failure with hypoxia (HCC)   Essential hypertension   HLD (hyperlipidemia)   Community acquired pneumonia   Transaminitis   Hyperglycemia   AKI (acute kidney injury) ([place])   Bilateral pulmonary embolism ([place])   1.Acute hypoxic respiratory  failure due to SARS COVID-19 viral pneumonia. Sp convalescent plasma and actemra on 04/10/19.Completed 5 doses of Remdesivir 11/20.  RR: 22 Pulse oxymetry: 93%  Fi02: 15 LPM non rebreather mask   COVID-19 Labs  Recent Labs    04/14/19 0135 04/15/19 0320 04/16/19 0346  DDIMER >20.00* >20.00* >20.00*  FERRITIN 326 356* 263  CRP 3.4* 3.7* 4.4*    Lab Results  Component Value Date   SARSCOV2NAA POSITIVE (A) 04/08/2019   SARSCOV2NAA Detected (A) 04/04/2019    Continue to have persistent high oxygen requirements, likely lung injury from [place] 19, target 02 saturation 88%.   Inflammatory markers stable today.  On systemiccorticosteroids,dexamethasone 6 mg Iv daily #8/10.On withantitussive agents, bronchodilators, and airway clearing techniques with flutter valve and incentive spirometer.   Prone position as tolerated. Continue flonase and saline nasal spry for nasal congestion. Last night had lorazepam for anxiety.   Patient continue to be at high risk for worsening respiratory failure.   2. Acute pulmonary embolism. echocardiogram with preserved left and right systolic function.Continue anticoagulation with rivaroxaban.  3. T2DM with steroid induced hyperglycemia.Fasting glucose today is 100, will continue glucose cover and monitoring with insulin sliding scale.  4. Dyslipidemia. On atorvastatin.  5. BPH. Onflomax.   6. Obesity.His calculated BMI is 37,12,   7. AKI on CKD stage 1.sp diuresis, renal function stable at 1,0 serum cr, K at 4,4 and serum bicarbonate at 27.  No frank end organ failure due to infection, sepsis ruled out.  DVT prophylaxis:enoxaparin Code Status:full Family Communication:no family at the bedside Disposition Plan/ discharge barriers:pending clinical improvement.   Body mass index is 37.12 kg/m. Malnutrition Type:      Malnutrition Characteristics:      Nutrition Interventions:     RN Pressure  Injury Documentation:  Consultants:     Procedures:     Antimicrobials:       Subjective: This am had a severe episode of dyspnea and desaturation while briefly on room air, slowly recovered oxymetry, with supplemental 02 per high flow nasal cannula and non rebreather.   Objective: Vitals:   04/15/19 1600 04/15/19 1955 04/16/19 0045 04/16/19 0509  BP: (!) 152/97 110/81 132/76 116/76  Pulse: (!) 106 (!) 105 85 100  Resp: (!) 22  (!) 22 (!) 22  Temp: (!) 97.5 F (36.4 C) 98.1 F (36.7 C) 98 F (36.7 C) 97.8 F (36.6 C)  TempSrc: Oral Oral Oral Axillary  SpO2:  98% 94% 93%  Weight:      Height:        Intake/Output Summary (Last 24 hours) at 04/16/2019 0804 Last data filed at 04/16/2019 0513 Gross per 24 hour  Intake -  Output 350 ml  Net -350 ml   Filed Weights   04/09/19 0300  Weight: 104.3 kg    Examination:   General: Not in pain or dyspnea. Deconditioned.  Neurology: Awake and alert, non focal  E ENT: mild pallor, no icterus, oral mucosa moist Cardiovascular: No JVD. S1-S2 present, rhythmic, no gallops, rubs, or murmurs. No lower extremity edema. Pulmonary: positive breath sounds bilaterally. Gastrointestinal. Abdomen with, no organomegaly, non tender, no rebound or guarding Skin. No rashes Musculoskeletal: no joint deformities     Data Reviewed: I have personally reviewed following labs and imaging studies  CBC: Recent Labs  Lab 04/10/19 0800 04/11/19 0535 04/13/19 0135  WBC 14.4* 16.0* 21.0*  HGB 15.4 16.1 15.5  HCT 45.7 48.2 46.9  MCV 94.2 92.3 93.1  PLT 176 197 123456   Basic Metabolic Panel: Recent Labs  Lab 04/11/19 0535 04/12/19 0305 04/12/19 1040 04/14/19 0135 04/15/19 0320 04/16/19 0346  NA 138 139 140 141 136 139  K 4.4 4.3 4.5 4.1 4.5 4.4  CL 98 100 102 103 99 99  CO2 25 23 25 25 26 27   GLUCOSE 180* 150* 176* 150* 112* 100*  BUN 30* 29* 29* 26* 20 21  CREATININE 1.03 1.15 1.20 1.02 1.02 1.05  CALCIUM 8.7* 8.6*  8.0* 8.6* 8.4* 8.8*  MG 2.5*  --   --   --   --   --    GFR: Estimated Creatinine Clearance: 81.5 mL/min (by C-G formula based on SCr of 1.05 mg/dL). Liver Function Tests: Recent Labs  Lab 04/11/19 0535 04/12/19 0305 04/12/19 1040 04/15/19 0320 04/16/19 0346  AST 52* 56* 62* 59* 51*  ALT 50* 49* 48* 51* 48*  ALKPHOS 138* 171* 162* 221* 259*  BILITOT 0.7 0.8 1.6* 1.4* 1.2  PROT 7.9 7.0 6.6 6.8 6.6  ALBUMIN 3.4* 3.1* 3.1* 3.4* 3.3*   No results for input(s): LIPASE, AMYLASE in the last 168 hours. No results for input(s): AMMONIA in the last 168 hours. Coagulation Profile: No results for input(s): INR, PROTIME in the last 168 hours. Cardiac Enzymes: No results for input(s): CKTOTAL, CKMB, CKMBINDEX, TROPONINI in the last 168 hours. BNP (last 3 results) No results for input(s): PROBNP in the last 8760 hours. HbA1C: No results for input(s): HGBA1C in the last 72 hours. CBG: Recent Labs  Lab 04/14/19 1703 04/14/19 2037 04/15/19 0752 04/15/19 1150 04/15/19 2230  GLUCAP 164* 141* 129* 193* 175*   Lipid Profile: No results for input(s): CHOL, HDL, LDLCALC, TRIG, CHOLHDL, LDLDIRECT in the last 72 hours. Thyroid Function Tests: No results for input(s): TSH, T4TOTAL, FREET4, T3FREE,  THYROIDAB in the last 72 hours. Anemia Panel: Recent Labs    04/15/19 0320 04/16/19 0346  FERRITIN 356* 263      Radiology Studies: I have reviewed all of the imaging during this hospital visit personally     Scheduled Meds: . aspirin  81 mg Oral Daily  . atorvastatin  40 mg Oral q1800  . chlorpheniramine-HYDROcodone  5 mL Oral Q12H  . dexamethasone (DECADRON) injection  6 mg Intravenous Q24H  . famotidine  20 mg Oral BID  . fluticasone  2 spray Each Nare BID  . insulin aspart  0-9 Units Subcutaneous TID WC  . Ipratropium-Albuterol  1 puff Inhalation Q6H  . LORazepam  0.5 mg Intravenous Once  . Rivaroxaban  15 mg Oral BID WC  . [START ON 05/03/2019] rivaroxaban  20 mg Oral Q  supper  . tamsulosin  0.4 mg Oral QPC supper  . ursodiol  300 mg Oral BID  . vitamin C  500 mg Oral Daily  . zinc sulfate  220 mg Oral Daily   Continuous Infusions:   LOS: 7 days        Mauricio Gerome Apley, MD

## 2019-04-16 NOTE — Plan of Care (Addendum)
Patient o2 dropped to 60% due to having non- rebreather and HFNC off. Once reapplied he recovered to 96%, patient displayed confusion and was gasping for air. Patient also complained of right hand numbness and tingling. MD notified. Will continue to monitor.  Problem: Respiratory: Goal: Will maintain a patent airway 04/16/2019 0950 by Orvan Falconer, RN Outcome: Not Progressing 04/16/2019 0950 by Orvan Falconer, RN Outcome: Not Progressing 04/16/2019 0950 by Orvan Falconer, RN Outcome: Progressing Goal: Complications related to the disease process, condition or treatment will be avoided or minimized 04/16/2019 0950 by Orvan Falconer, RN Outcome: Not Progressing 04/16/2019 0950 by Orvan Falconer, RN Outcome: Not Progressing 04/16/2019 0950 by Orvan Falconer, RN Outcome: Progressing

## 2019-04-16 NOTE — Progress Notes (Signed)
Occupational Therapy Treatment Patient Details Name: [name] MRN: [id number] DOB: 1956-05-24 Today's Date: 04/16/2019    History of present illness 63 y/o male w/ hx liver disorder, HTN, HLD, R BKA, GERD presented to ED with SOB, cough, fever and chills, admitted with dx acute hypoxic resp failure sec to SARS COVID 19.His follow-up CT chest showed segmental and subsegmental pulmonary embolism within branches of the right lower lobe.   OT comments  Noted change in RUE coordination and sensation this date while patient was completing self-care tasks. Pt noted to be knocking over drink when attempting to reach for right hand as well as increased difficulty donning RLE prosthesis when using right hand. RN notified of change. Pt tolerated sitting EOB ~15 min. DOE 2/4. Pt currently on 15L NRB and 15L HFNC. O2 SATs dropped to 76% while engaging in lower body dressing task with mod rest break required for O2 SATs to return to 95%. Continued education with pt on safety strategies and energy conservation techniques with fair understanding and recall from previous session. Pt returned to bed due to concern for change in medical status. RN alerted the MD.    Follow Up Recommendations  Other (comment)(Depending on hospital course and illness, may need services)    Equipment Recommendations  Other (comment)(may need RW)    Recommendations for Other Services      Precautions / Restrictions Precautions Precautions: Fall Precaution Comments: R BKA       Mobility Bed Mobility Overal bed mobility: Modified Independent                Transfers Overall transfer level: Needs assistance Equipment used: Rolling walker (2 wheeled) Transfers: Sit to/from Stand Sit to Stand: Min guard;Min assist         General transfer comment: Sit/stand x 1    Balance Overall balance assessment: Needs assistance   Sitting balance-Leahy Scale: Normal                                     ADL either performed or assessed with clinical judgement   ADL Overall ADL's : Needs assistance/impaired Eating/Feeding: Minimal assistance Eating/Feeding Details (indicate cue type and reason): Pt noted to knock drink over when attempting to reach with right hand Grooming: Minimal assistance   Upper Body Bathing: Minimal assistance;Sitting   Lower Body Bathing: Minimal assistance;Moderate assistance;Sitting/lateral leans;Sit to/from stand   Upper Body Dressing : Minimal assistance;Sitting   Lower Body Dressing: Minimal assistance;Sit to/from stand;Sitting/lateral leans   Toilet Transfer: Minimal assistance;BSC   Toileting- Clothing Manipulation and Hygiene: Minimal assistance;Sit to/from stand;Sitting/lateral lean       Functional mobility during ADLs: Min guard;Minimal assistance General ADL Comments: Pt requiring increased assistance with self-care tasks this date due to RUE coordination deficits.     Vision       Perception     Praxis      Cognition Arousal/Alertness: Awake/alert Behavior During Therapy: WFL for tasks assessed/performed Overall Cognitive Status: Within Functional Limits for tasks assessed                                          Exercises     Shoulder Instructions       General Comments Pt tolerated sitting edge of bed ~15 min while engaging in self-care tasks. Pt noted  to have increased difficulty this date coordination movements with RUE. RN and MD notified.    Pertinent Vitals/ Pain       Pain Assessment: No/denies pain  Home Living                                          Prior Functioning/Environment              Frequency  Min 3X/week        Progress Toward Goals  OT Goals(current goals can now be found in the care plan section)  Progress towards OT goals: Progressing toward goals  Acute Rehab OT Goals Patient Stated Goal: to get better and go home ADL Goals Pt Will Perform  Lower Body Bathing: with modified independence;sit to/from stand Pt Will Perform Lower Body Dressing: with modified independence;sit to/from stand Pt Will Perform Toileting - Clothing Manipulation and hygiene: with modified independence Pt Will Perform Tub/Shower Transfer: with modified independence;ambulating;Shower transfer;3 in 1 Pt/caregiver will Perform Home Exercise Program: Independently;With theraband;Increased strength;Both right and left upper extremity;With written HEP provided Additional ADL Goal #1: Pt will independently verbalize 3 strategies to reduce risk of falls  Plan Discharge plan remains appropriate    Co-evaluation                 AM-PAC OT "6 Clicks" Daily Activity     Outcome Measure   Help from another person eating meals?: A Little Help from another person taking care of personal grooming?: A Little Help from another person toileting, which includes using toliet, bedpan, or urinal?: A Little Help from another person bathing (including washing, rinsing, drying)?: A Little Help from another person to put on and taking off regular upper body clothing?: A Little Help from another person to put on and taking off regular lower body clothing?: A Little 6 Click Score: 18    End of Session Equipment Utilized During Treatment: Oxygen  OT Visit Diagnosis: Unsteadiness on feet (R26.81);Muscle weakness (generalized) (M62.81)   Activity Tolerance Patient limited by fatigue(Limited by SOB)   Patient Left in bed;with call bell/phone within reach   Nurse Communication Mobility status(RUE coordination and sensation concern)        Time: [id number] OT Time Calculation (min): 25 min  Charges: OT Treatments $Self Care/Home Management : 8-22 mins $Therapeutic Activity: 8-22 mins  Mauri Brooklyn OTR/L [phone]    Mauri Brooklyn 04/16/2019, 4:48 PM

## 2019-04-17 ENCOUNTER — Inpatient Hospital Stay (HOSPITAL_COMMUNITY): Payer: PRIVATE HEALTH INSURANCE

## 2019-04-17 LAB — COMPREHENSIVE METABOLIC PANEL
ALT: 39 U/L (ref 0–44)
AST: 52 U/L — ABNORMAL HIGH (ref 15–41)
Albumin: 3.3 g/dL — ABNORMAL LOW (ref 3.5–5.0)
Alkaline Phosphatase: 239 U/L — ABNORMAL HIGH (ref 38–126)
Anion gap: 10 (ref 5–15)
BUN: 21 mg/dL (ref 8–23)
CO2: 27 mmol/L (ref 22–32)
Calcium: 8.5 mg/dL — ABNORMAL LOW (ref 8.9–10.3)
Chloride: 101 mmol/L (ref 98–111)
Creatinine, Ser: 1.11 mg/dL (ref 0.61–1.24)
GFR calc Af Amer: >60 mL/min (ref >60)
GFR calc non Af Amer: >60 mL/min (ref >60)
Glucose, Bld: 125 mg/dL — ABNORMAL HIGH (ref 70–99)
Potassium: 5 mmol/L (ref 3.5–5.1)
Sodium: 138 mmol/L (ref 135–145)
Total Bilirubin: 1.5 mg/dL — ABNORMAL HIGH (ref 0.3–1.2)
Total Protein: 6.3 g/dL — ABNORMAL LOW (ref 6.5–8.1)

## 2019-04-17 LAB — GLUCOSE, CAPILLARY
Glucose-Capillary: 130 mg/dL — ABNORMAL HIGH (ref 70–99)
Glucose-Capillary: 172 mg/dL — ABNORMAL HIGH (ref 70–99)
Glucose-Capillary: 177 mg/dL — ABNORMAL HIGH (ref 70–99)

## 2019-04-17 LAB — C-REACTIVE PROTEIN: CRP: 4.4 mg/dL — ABNORMAL HIGH (ref <1.0)

## 2019-04-17 LAB — D-DIMER, QUANTITATIVE: D-Dimer, Quant: >20 ug/mL-FEU — ABNORMAL HIGH (ref 0.00–0.50)

## 2019-04-17 LAB — FERRITIN: Ferritin: 200 ng/mL (ref 24–336)

## 2019-04-17 NOTE — Progress Notes (Addendum)
Physical Therapy Treatment Patient Details Name: [name] MRN: [id number] DOB: [birth year] Today's Date: 04/17/2019    History of Present Illness 63 y/o male w/ hx liver disorder, HTN, HLD, R BKA, GERD presented to ED with SOB, cough, fever and chills, admitted with dx acute hypoxic resp failure sec to SARS COVID 19.His follow-up CT chest showed segmental and subsegmental pulmonary embolism within branches of the right lower lobe.    PT Comments    Pt seems to have had a decline in function and cognition over past few days. He has poor recollection of education from last week, took >80mins to donn prosthesis and needed mod a to donn regular shoe (therapist had asked pt to donn prosthesis and would return to work w/ him therapist returned 20 mins later and pt had not completed donning prosthesis). Pt is now needing min-mod a with mobility, was able to ambulate in room 78ft w/ RW and mod a, not difficulty with RLE clearance, which was previously not there. With ambulation of 65ft pt desat to 72% on NRB needing 3 mins to recover back to 88% at rest pt is on 15L/min via HFNC and sats in 80-90s.     Follow Up Recommendations  Other (comment)(TBD)     Equipment Recommendations  Rolling walker with 5" wheels    Recommendations for Other Services       Precautions / Restrictions Precautions Precautions: Fall Restrictions Weight Bearing Restrictions: No    Mobility  Bed Mobility Overal bed mobility: Needs Assistance Bed Mobility: Sit to Supine       Sit to supine: Supervision      Transfers Overall transfer level: Needs assistance Equipment used: Rolling walker (2 wheeled) Transfers: Sit to/from Stand Sit to Stand: Min assist            Ambulation/Gait Ambulation/Gait assistance: Min Web designer (Feet): 28 Feet Assistive device: Rolling walker (2 wheeled) Gait Pattern/deviations: (difficulty with clearing RLE today)         Stairs              Wheelchair Mobility    Modified Rankin (Stroke Patients Only)       Balance Overall balance assessment: Needs assistance Sitting-balance support: Feet unsupported Sitting balance-Leahy Scale: Good     Standing balance support: During functional activity;Bilateral upper extremity supported Standing balance-Leahy Scale: Fair                              Cognition Arousal/Alertness: Lethargic Behavior During Therapy: WFL for tasks assessed/performed Overall Cognitive Status: Impaired/Different from baseline Area of Impairment: Problem solving;Awareness;Safety/judgement;Memory;Following commands                     Memory: Decreased recall of precautions Following Commands: Follows multi-step commands with increased time Safety/Judgement: Decreased awareness of safety   Problem Solving: Requires verbal cues General Comments: declined noted from last session with this therapist, dos not recall info given and seems to be processing much slower than previous      Exercises      General Comments        Pertinent Vitals/Pain      Home Living                      Prior Function            PT Goals (current goals can now be found in the care plan  section) Acute Rehab PT Goals Patient Stated Goal: none stated today Progress towards PT goals: Not progressing toward goals - comment    Frequency    Min 3X/week      PT Plan Other (comment)(will monitor to see if change needed)    Co-evaluation              AM-PAC PT "6 Clicks" Mobility   Outcome Measure  Help needed turning from your back to your side while in a flat bed without using bedrails?: A Little Help needed moving from lying on your back to sitting on the side of a flat bed without using bedrails?: None Help needed moving to and from a bed to a chair (including a wheelchair)?: A Little Help needed standing up from a chair using your arms (e.g., wheelchair or  bedside chair)?: A Little Help needed to walk in hospital room?: A Little Help needed climbing 3-5 steps with a railing? : A Lot 6 Click Score: 18    End of Session Equipment Utilized During Treatment: Oxygen Activity Tolerance: Treatment limited secondary to medical complications (Comment) Patient left: in bed;with call bell/phone within reach   PT Visit Diagnosis: Muscle weakness (generalized) (M62.81);Other abnormalities of gait and mobility (R26.89)     Time: [id number] PT Time Calculation (min) (ACUTE ONLY): 39 min  Charges:  $Therapeutic Activity: 23-37 mins $Self Care/Home Management: Zuehl, PT    [name] 04/17/2019, 4:16 PM

## 2019-04-17 NOTE — Progress Notes (Signed)
PROGRESS NOTE    [name]  [id number] DOB: Jun 27, 1955 DOA: 04/08/2019 PCP: [name], FNP    Brief Narrative:  63 year old male who presented with dyspnea, he does have significant past medical history for hypertension, dyslipidemia and right BKA. Patient reported 4 days of shortness of breath, associated with productive cough, fevers, andchills.He was treated as an outpatient with prednisone and oral antibiotics but due to persistent symptoms he came to the hospital for further evaluation. His initial physical examination his blood pressure was 115/77, pulse rate 98, respiratory 21, oxygen saturation 88%. His lungs had no rhonchi or rails, heart S1-S2 present rhythmic, abdomen soft, no lower extremity edema. SARS COVID-19 was positive. Chest radiograph(personally reviewed), faint infiltrate left upper lobe interstitial.  Patient was admitted to the hospital working diagnosis of acute hypoxic respiratory failure due to SARS COVID-19 viral pneumonia.  Patient has been treated with systemic steroids and remdesivir. Due to persistent and worsening hypoxic respiratory failure he received Actemra and convalescent plasma.  His follow-up CT chest showed segmental and subsegmental pulmonary embolism within branches of the right lower lobe.  Patient continue to have a slow recovery, continue with high oxygen requirements.  Follow up chest radiograph from 11/20 with positive signs of pulmonary edema,diuresed with furosemide. Achieved negative fluid balance, with no significant improvement of oxygenation.   Continue to have persistent high oxygen requirements, likely lung injury from [place].  Assessment & Plan:   Principal Problem:   Pneumonia due to COVID-19 virus Active Problems:   Acute respiratory failure with hypoxia (HCC)   Essential hypertension   HLD (hyperlipidemia)   Community acquired pneumonia   Transaminitis   Hyperglycemia   AKI (acute  kidney injury) ([place])   Bilateral pulmonary embolism ([place])   1.Acute hypoxic respiratory failure due to SARS COVID-19 viral pneumonia. Sp convalescent plasma and actemra on 04/10/19.Completed 5 doses of Remdesivir 11/20  RR: 20  Pulse oxymetry: 99 Fi02:15 LPM HFNC and non rebreather mask.  COVID-19 Labs  Recent Labs    04/15/19 0320 04/16/19 0346 04/17/19 0411  DDIMER >20.00* >20.00* >20.00*  FERRITIN 356* 263 200  CRP 3.7* 4.4* 4.4*    Lab Results  Component Value Date   SARSCOV2NAA POSITIVE (A) 04/08/2019   SARSCOV2NAA Detected (A) 04/04/2019    Continue to have stable inflammatory markers.   Continue with systemiccorticosteroids,dexamethasone 6 mg Iv daily #9.Antitussive agents, bronchodilators, and airway clearing techniques with flutter valve and incentive spirometer.  Continue prone position as tolerated. On flonase and saline nasal spry for nasal congestion. Completed 3 days of afrin.   Continue to be high risk for worsening respiratory failure.   2, NEW focal neurological deficit. Patient with right hand apraxia, fine motor function, strength is preserved. He has high pre-test probability for thromboembolic disease including CVA due to severe COVID 19 disease. Will continue neuro checks q 4 H and will order an MRI. Continue with aspirin and atorvastatin.   2. Acute pulmonary embolism. echocardiogram with preserved left and right systolic function.Tolerating well anticoagulation with rivaroxaban.  3. T2DM with steroid induced hyperglycemia.Fasting glucosetodayis125,capillary 130 and 170. Continue current regimen of insulin with sliding scale.    4. Dyslipidemia. Continue with atorvastatin.  5. BPH.Continue withflomax.  6. Obesity.CaculatedBMI is 37,12,   7. 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 Stable renal function and electrolytes, no further diuresis indicated. Fluid balance since admission continue to be negative 2,757 ml.   No frank end organ  failure due to infection, sepsis ruled out.  DVT  prophylaxis:enoxaparin Code Status:full Family Communication:no family at the bedside Disposition Plan/ discharge barriers:pending clinical improvement.    Body mass index is 37.12 kg/m. Malnutrition Type:      Malnutrition Characteristics:      Nutrition Interventions:     RN Pressure Injury Documentation:     Consultants:     Procedures:     Antimicrobials:       Subjective:: Patient with decrease fine motor skills on his right hand, writing, holding cup and opening food packages. Started with paresthesias yesterday and today evolved into weakness. No nausea or vomiting. No chest pain. Continue to have dyspnea, worse with exertion, positive nasal congestion and cough.   Objective: Vitals:   04/16/19 1200 04/16/19 1945 04/17/19 0300 04/17/19 0730  BP: 131/82 114/80 132/81 (!) 141/71  Pulse: (!) 110 (!) 104 (!) 102 (!) 106  Resp:   20 20  Temp: 97.8 F (36.6 C) 97.8 F (36.6 C) 98.1 F (36.7 C) 97.9 F (36.6 C)  TempSrc: Axillary Oral Oral Oral  SpO2: 100% 100% 99%   Weight:      Height:        Intake/Output Summary (Last 24 hours) at 04/17/2019 0802 Last data filed at 04/17/2019 [id number] Gross per 24 hour  Intake 300 ml  Output 250 ml  Net 50 ml   Filed Weights   04/09/19 0300  Weight: 104.3 kg    Examination:   General: deconditioned and ill looking appearing.  Neurology: Awake and alert, positive decrease fine motor function of rith hand, strength distal and proximal upper extremities is intact, no facial weakness.   E ENT: no pallor, no icterus, oral mucosa moist Cardiovascular: No JVD. S1-S2 present, rhythmic, no gallops, rubs, or murmurs. No lower extremity edema. Pulmonary: positive breath sounds bilaterally. Gastrointestinal. Abdomen flat, no organomegaly, non tender, no rebound or guarding Skin. No rashes Musculoskeletal: right BKA.      Data Reviewed: I have  personally reviewed following labs and imaging studies  CBC: Recent Labs  Lab 04/11/19 0535 04/13/19 0135  WBC 16.0* 21.0*  HGB 16.1 15.5  HCT 48.2 46.9  MCV 92.3 93.1  PLT 197 123456   Basic Metabolic Panel: Recent Labs  Lab 04/11/19 0535  04/12/19 1040 04/14/19 0135 04/15/19 0320 04/16/19 0346 04/17/19 0411  NA 138   < > 140 141 136 139 138  K 4.4   < > 4.5 4.1 4.5 4.4 5.0  CL 98   < > 102 103 99 99 101  CO2 25   < > 25 25 26 27 27   GLUCOSE 180*   < > 176* 150* 112* 100* 125*  BUN 30*   < > 29* 26* 20 21 21   CREATININE 1.03   < > 1.20 1.02 1.02 1.05 1.11  CALCIUM 8.7*   < > 8.0* 8.6* 8.4* 8.8* 8.5*  MG 2.5*  --   --   --   --   --   --    < > = values in this interval not displayed.   GFR: Estimated Creatinine Clearance: 77.1 mL/min (by C-G formula based on SCr of 1.11 mg/dL). Liver Function Tests: Recent Labs  Lab 04/12/19 0305 04/12/19 1040 04/15/19 0320 04/16/19 0346 04/17/19 0411  AST 56* 62* 59* 51* 52*  ALT 49* 48* 51* 48* 39  ALKPHOS 171* 162* 221* 259* 239*  BILITOT 0.8 1.6* 1.4* 1.2 1.5*  PROT 7.0 6.6 6.8 6.6 6.3*  ALBUMIN 3.1* 3.1* 3.4* 3.3* 3.3*   No results  for input(s): LIPASE, AMYLASE in the last 168 hours. No results for input(s): AMMONIA in the last 168 hours. Coagulation Profile: No results for input(s): INR, PROTIME in the last 168 hours. Cardiac Enzymes: No results for input(s): CKTOTAL, CKMB, CKMBINDEX, TROPONINI in the last 168 hours. BNP (last 3 results) No results for input(s): PROBNP in the last 8760 hours. HbA1C: No results for input(s): HGBA1C in the last 72 hours. CBG: Recent Labs  Lab 04/15/19 2230 04/16/19 0824 04/16/19 1238 04/16/19 1602 04/16/19 2148  GLUCAP 175* 142* 177* 224* 174*   Lipid Profile: No results for input(s): CHOL, HDL, LDLCALC, TRIG, CHOLHDL, LDLDIRECT in the last 72 hours. Thyroid Function Tests: No results for input(s): TSH, T4TOTAL, FREET4, T3FREE, THYROIDAB in the last 72 hours. Anemia Panel:  Recent Labs    04/16/19 0346 04/17/19 0411  FERRITIN 263 200      Radiology Studies: I have reviewed all of the imaging during this hospital visit personally     Scheduled Meds: . aspirin  81 mg Oral Daily  . atorvastatin  40 mg Oral q1800  . chlorpheniramine-HYDROcodone  5 mL Oral Q12H  . dexamethasone (DECADRON) injection  6 mg Intravenous Q24H  . famotidine  20 mg Oral BID  . fluticasone  2 spray Each Nare BID  . insulin aspart  0-9 Units Subcutaneous TID WC  . Ipratropium-Albuterol  1 puff Inhalation Q6H  . LORazepam  0.5 mg Intravenous Once  . Rivaroxaban  15 mg Oral BID WC  . [START ON 05/03/2019] rivaroxaban  20 mg Oral Q supper  . tamsulosin  0.4 mg Oral QPC supper  . ursodiol  300 mg Oral BID  . vitamin C  500 mg Oral Daily  . zinc sulfate  220 mg Oral Daily   Continuous Infusions:   LOS: 8 days        Mauricio Gerome Apley, MD

## 2019-04-17 NOTE — Plan of Care (Addendum)
  Problem: Education: Goal: Knowledge of risk factors and measures for prevention of condition will improve 04/17/2019 1218 by Orvan Falconer, RN Outcome: Progressing 04/17/2019 1217 by Orvan Falconer, RN Outcome: Progressing   Problem: Coping: Goal: Psychosocial and spiritual needs will be supported 04/17/2019 1218 by Orvan Falconer, RN Outcome: Progressing 04/17/2019 1217 by Orvan Falconer, RN Outcome: Progressing   Problem: Respiratory: Goal: Will maintain a patent airway 04/17/2019 1218 by Orvan Falconer, RN Outcome: Progressing 04/17/2019 1217 by Orvan Falconer, RN Outcome: Progressing Goal: Complications related to the disease process, condition or treatment will be avoided or minimized 04/17/2019 1218 by Orvan Falconer, RN Outcome: Progressing 04/17/2019 1217 by Orvan Falconer, RN Outcome: Progressing

## 2019-04-18 ENCOUNTER — Inpatient Hospital Stay (HOSPITAL_COMMUNITY): Payer: PRIVATE HEALTH INSURANCE

## 2019-04-18 DIAGNOSIS — R7401 Elevation of levels of liver transaminase levels: Secondary | ICD-10-CM

## 2019-04-18 LAB — GLUCOSE, CAPILLARY
Glucose-Capillary: 180 mg/dL — ABNORMAL HIGH (ref 70–99)
Glucose-Capillary: 137 mg/dL — ABNORMAL HIGH (ref 70–99)
Glucose-Capillary: 273 mg/dL — ABNORMAL HIGH (ref 70–99)
Glucose-Capillary: 213 mg/dL — ABNORMAL HIGH (ref 70–99)
Glucose-Capillary: 167 mg/dL — ABNORMAL HIGH (ref 70–99)

## 2019-04-18 LAB — COMPREHENSIVE METABOLIC PANEL
ALT: 42 U/L (ref 0–44)
AST: 61 U/L — ABNORMAL HIGH (ref 15–41)
Albumin: 3.5 g/dL (ref 3.5–5.0)
Alkaline Phosphatase: 273 U/L — ABNORMAL HIGH (ref 38–126)
Anion gap: 14 (ref 5–15)
BUN: 21 mg/dL (ref 8–23)
CO2: 23 mmol/L (ref 22–32)
Calcium: 8.6 mg/dL — ABNORMAL LOW (ref 8.9–10.3)
Chloride: 102 mmol/L (ref 98–111)
Creatinine, Ser: 1.01 mg/dL (ref 0.61–1.24)
GFR calc Af Amer: >60 mL/min (ref >60)
GFR calc non Af Amer: >60 mL/min (ref >60)
Glucose, Bld: 119 mg/dL — ABNORMAL HIGH (ref 70–99)
Potassium: 4.5 mmol/L (ref 3.5–5.1)
Sodium: 139 mmol/L (ref 135–145)
Total Bilirubin: 1.5 mg/dL — ABNORMAL HIGH (ref 0.3–1.2)
Total Protein: 7 g/dL (ref 6.5–8.1)

## 2019-04-18 LAB — FERRITIN: Ferritin: 239 ng/mL (ref 24–336)

## 2019-04-18 LAB — D-DIMER, QUANTITATIVE: D-Dimer, Quant: >20 ug/mL-FEU — ABNORMAL HIGH (ref 0.00–0.50)

## 2019-04-18 LAB — C-REACTIVE PROTEIN: CRP: 3.9 mg/dL — ABNORMAL HIGH (ref <1.0)

## 2019-04-18 MED ORDER — SODIUM CHLORIDE 0.9 % IV SOLN
1.0000 g | INTRAVENOUS | Status: DC
  Administered 2019-04-18: 13:00:00 1 g via INTRAVENOUS
  Filled 2019-04-18: qty 10

## 2019-04-18 NOTE — Progress Notes (Signed)
Notified Dr. Cathlean Sauer at 1029 that patient had a HR 95-130bpm & RR 20-30bpm, that both had been elevated since 7 am max on HFNC. Using Non- rebreather at times. Notified and requested that RT Phillis Knack and ICU Charge nurse/Rapid Response Nurse Rick Duff RN come assess patient as well. Dr. Cathlean Sauer stated that he was on the way to see pt at 1031. New orders placed by Dr. Cathlean Sauer. [facility] Nira Conn stated that ok for pt to stay here. Dr. Cathlean Sauer stated that he would change pt status to PCU level of care. [place] is yellow, started VS at 1138, Dr. Cathlean Sauer, RT [facility] and ICU RN Nira Conn had already assessed pt and aware of elevated HR&RR.

## 2019-04-18 NOTE — Progress Notes (Signed)
PROGRESS NOTE    [name]  [id number] DOB: 08/09/1955 DOA: 04/08/2019 PCP: [name], FNP    Brief Narrative:  63 year old male who presented with dyspnea, he does have significant past medical history for hypertension, dyslipidemia and right BKA. Patient reported 4 days of shortness of breath, associated with productive cough, fevers, andchills.He was treated as an outpatient with prednisone and oral antibiotics but due to persistent symptoms he came to the hospital for further evaluation. His initial physical examination his blood pressure was 115/77, pulse rate 98, respiratory 21, oxygen saturation 88%. His lungs had no rhonchi or rails, heart S1-S2 present rhythmic, abdomen soft, no lower extremity edema. SARS COVID-19 was positive. Chest radiograph(personally reviewed), faint infiltrate left upper lobe interstitial.  Patient was admitted to the hospital working diagnosis of acute hypoxic respiratory failure due to SARS COVID-19 viral pneumonia.  Patient has been treated with systemic steroids and remdesivir. Due to persistent and worsening hypoxic respiratory failure he received Actemra and convalescent plasma.  His follow-up CT chest showed segmental and subsegmental pulmonary embolism within branches of the right lower lobe.  Patient continue to have a slow recovery, continue with high oxygen requirements.  Follow up chest radiograph from 11/20 with positive signs of pulmonary edema,diuresed with furosemide. Achieved negative fluid balance, with no significant improvement of oxygenation.  Continue to have persistent high oxygen requirements, likely lung injury from [place] 19.  Patient with persistent episodic desaturation below 88% specially with activity.   Assessment & Plan:   Principal Problem:   Pneumonia due to COVID-19 virus Active Problems:   Acute respiratory failure with hypoxia (HCC)   Essential hypertension   HLD (hyperlipidemia)    Community acquired pneumonia   Transaminitis   Hyperglycemia   AKI (acute kidney injury) ([place])   Bilateral pulmonary embolism ([place])   1.Acute hypoxic respiratory failure due to SARS COVID-19 viral pneumonia. Sp convalescent plasma and actemra on 04/10/19.Completed 5 doses of Remdesivir 11/20.  RR: 18  Pulse oxymetry: 88 to 89  Fi02: 15 LPM per HFNC and non rebreather mask.  COVID-19 Labs  Recent Labs    04/16/19 0346 04/17/19 0411 04/18/19 0629  DDIMER >20.00* >20.00* >20.00*  FERRITIN 263 200 239  CRP 4.4* 4.4* 3.9*    Lab Results  Component Value Date   SARSCOV2NAA POSITIVE (A) 04/08/2019   SARSCOV2NAA Detected (A) 04/04/2019     Stable inflammatory markers, elevated d dimer, poor prognostic factor.   On systemic corticosteroids,dexamethasone 6 mg Iv daily #10.Continur with antitussive agents, bronchodilators, and airway clearing techniques with flutter valve and incentive spirometer.  Competed 3 days of afrin. Continue with flonase and saline nasal spry for nasal congestion.   Follow up chest film today noted bilateral patchy alveolar infiltrates right lower lobe and left upper lobe, suspected bacterial co-infection, will add IV ceftriaxone.   Continue to be high risk for worsening respiratory failure.  2, NEW focal neurological deficit. Symptoms are stable, head CT with no acute changes, unable to do MRI due to risk of oxygen desaturation while transport to MRI to [facility]. Will continue neuro checks and close monitoring if worsening symptoms, will repeat head CT.   2. Acute pulmonary embolism. echocardiogram with preserved left and right systolic function.Continue anticoagulation withrivaroxaban.  3. T2DM with steroid induced hyperglycemia.Fasting glucosetodayis119. Continue with insulin sliding scale for glucose cover and monitoring.     4. Dyslipidemia.On atorvastatin.  5. BPH.Onflomax.  6. Obesity.His caculatedBMI is 37,12,   7.  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  Stable renal function and electrolytes.    Body mass index is 37.12 kg/m. Malnutrition Type:      Malnutrition Characteristics:      Nutrition Interventions:     RN Pressure Injury Documentation:     Consultants:     Procedures:     Antimicrobials:   Ceftriaxone 11/25    Subjective: Patient continue to have dyspnea, worse with movement, now with positive productive cough, no nausea or vomiting. Persistent right hand weakness.   Objective: Vitals:   04/18/19 0000 04/18/19 0400 04/18/19 0700 04/18/19 0800  BP: 123/85 128/83 128/67 122/75  Pulse:   (!) 102 (!) 108  Resp:  20 18 18   Temp: 97.7 F (36.5 C) 97.7 F (36.5 C)    TempSrc: Oral Oral    SpO2: 99%  (!) 88% (!) 89%  Weight:      Height:       No intake or output data in the 24 hours ending 04/18/19 0904 Filed Weights   04/09/19 0300  Weight: 104.3 kg    Examination:   General: deconditioned and ill looking appearing.  Neurology: Awake and alert, non focal  E ENT: no pallor, no icterus, oral mucosa moist Cardiovascular: No JVD. S1-S2 present, rhythmic, no gallops, rubs, or murmurs. No lower extremity edema. Pulmonary: positive breath sounds bilaterally. Gastrointestinal. Abdomen with no organomegaly, non tender, no rebound or guarding Skin. No rashes Musculoskeletal: no joint deformities     Data Reviewed: I have personally reviewed following labs and imaging studies  CBC: Recent Labs  Lab 04/13/19 0135  WBC 21.0*  HGB 15.5  HCT 46.9  MCV 93.1  PLT 123456   Basic Metabolic Panel: Recent Labs  Lab 04/14/19 0135 04/15/19 0320 04/16/19 0346 04/17/19 0411 04/18/19 0629  NA 141 136 139 138 139  K 4.1 4.5 4.4 5.0 4.5  CL 103 99 99 101 102  CO2 25 26 27 27 23   GLUCOSE 150* 112* 100* 125* 119*  BUN 26* 20 21 21 21   CREATININE 1.02 1.02 1.05 1.11 1.01  CALCIUM 8.6* 8.4* 8.8* 8.5* 8.6*   GFR: Estimated Creatinine Clearance: 84.7 mL/min (by C-G formula  based on SCr of 1.01 mg/dL). Liver Function Tests: Recent Labs  Lab 04/12/19 1040 04/15/19 0320 04/16/19 0346 04/17/19 0411 04/18/19 0629  AST 62* 59* 51* 52* 61*  ALT 48* 51* 48* 39 42  ALKPHOS 162* 221* 259* 239* 273*  BILITOT 1.6* 1.4* 1.2 1.5* 1.5*  PROT 6.6 6.8 6.6 6.3* 7.0  ALBUMIN 3.1* 3.4* 3.3* 3.3* 3.5   No results for input(s): LIPASE, AMYLASE in the last 168 hours. No results for input(s): AMMONIA in the last 168 hours. Coagulation Profile: No results for input(s): INR, PROTIME in the last 168 hours. Cardiac Enzymes: No results for input(s): CKTOTAL, CKMB, CKMBINDEX, TROPONINI in the last 168 hours. BNP (last 3 results) No results for input(s): PROBNP in the last 8760 hours. HbA1C: No results for input(s): HGBA1C in the last 72 hours. CBG: Recent Labs  Lab 04/16/19 2148 04/17/19 0750 04/17/19 1246 04/17/19 2046 04/18/19 0738  GLUCAP 174* 130* 172* 177* 137*   Lipid Profile: No results for input(s): CHOL, HDL, LDLCALC, TRIG, CHOLHDL, LDLDIRECT in the last 72 hours. Thyroid Function Tests: No results for input(s): TSH, T4TOTAL, FREET4, T3FREE, THYROIDAB in the last 72 hours. Anemia Panel: Recent Labs    04/17/19 0411 04/18/19 0629  FERRITIN 200 239      Radiology Studies: I have reviewed all of the imaging during this  hospital visit personally     Scheduled Meds: . aspirin  81 mg Oral Daily  . atorvastatin  40 mg Oral q1800  . chlorpheniramine-HYDROcodone  5 mL Oral Q12H  . dexamethasone (DECADRON) injection  6 mg Intravenous Q24H  . famotidine  20 mg Oral BID  . fluticasone  2 spray Each Nare BID  . insulin aspart  0-9 Units Subcutaneous TID WC  . Ipratropium-Albuterol  1 puff Inhalation Q6H  . Rivaroxaban  15 mg Oral BID WC  . [START ON 05/03/2019] rivaroxaban  20 mg Oral Q supper  . tamsulosin  0.4 mg Oral QPC supper  . ursodiol  300 mg Oral BID  . vitamin C  500 mg Oral Daily  . zinc sulfate  220 mg Oral Daily   Continuous  Infusions:   LOS: 9 days        Tanita Palinkas Gerome Apley, MD

## 2019-04-19 DIAGNOSIS — J181 Lobar pneumonia, unspecified organism: Secondary | ICD-10-CM

## 2019-04-19 LAB — COMPREHENSIVE METABOLIC PANEL
ALT: 39 U/L (ref 0–44)
AST: 53 U/L — ABNORMAL HIGH (ref 15–41)
Albumin: 3.5 g/dL (ref 3.5–5.0)
Alkaline Phosphatase: 238 U/L — ABNORMAL HIGH (ref 38–126)
Anion gap: 12 (ref 5–15)
BUN: 22 mg/dL (ref 8–23)
CO2: 25 mmol/L (ref 22–32)
Calcium: 8.6 mg/dL — ABNORMAL LOW (ref 8.9–10.3)
Chloride: 100 mmol/L (ref 98–111)
Creatinine, Ser: 1.02 mg/dL (ref 0.61–1.24)
GFR calc Af Amer: >60 mL/min (ref >60)
GFR calc non Af Amer: >60 mL/min (ref >60)
Glucose, Bld: 116 mg/dL — ABNORMAL HIGH (ref 70–99)
Potassium: 4.5 mmol/L (ref 3.5–5.1)
Sodium: 137 mmol/L (ref 135–145)
Total Bilirubin: 1.1 mg/dL (ref 0.3–1.2)
Total Protein: 6.7 g/dL (ref 6.5–8.1)

## 2019-04-19 LAB — GLUCOSE, CAPILLARY
Glucose-Capillary: 103 mg/dL — ABNORMAL HIGH (ref 70–99)
Glucose-Capillary: 196 mg/dL — ABNORMAL HIGH (ref 70–99)
Glucose-Capillary: 225 mg/dL — ABNORMAL HIGH (ref 70–99)
Glucose-Capillary: 141 mg/dL — ABNORMAL HIGH (ref 70–99)

## 2019-04-19 LAB — FERRITIN: Ferritin: 188 ng/mL (ref 24–336)

## 2019-04-19 LAB — CBC
HCT: 47.2 % (ref 39.0–52.0)
Hemoglobin: 15.7 g/dL (ref 13.0–17.0)
MCH: 31.8 pg (ref 26.0–34.0)
MCHC: 33.3 g/dL (ref 30.0–36.0)
MCV: 95.5 fL (ref 80.0–100.0)
Platelets: 62 10*3/uL — ABNORMAL LOW (ref 150–400)
RBC: 4.94 MIL/uL (ref 4.22–5.81)
RDW: 14.6 % (ref 11.5–15.5)
WBC: 22.3 10*3/uL — ABNORMAL HIGH (ref 4.0–10.5)
nRBC: 0 % (ref 0.0–0.2)

## 2019-04-19 LAB — D-DIMER, QUANTITATIVE: D-Dimer, Quant: >20 ug/mL-FEU — ABNORMAL HIGH (ref 0.00–0.50)

## 2019-04-19 LAB — C-REACTIVE PROTEIN: CRP: 2 mg/dL — ABNORMAL HIGH (ref <1.0)

## 2019-04-19 MED ORDER — PREDNISONE 20 MG PO TABS
20.0000 mg | ORAL_TABLET | Freq: Every day | ORAL | Status: DC
  Administered 2019-04-20 – 2019-04-22 (×3): 20 mg via ORAL
  Filled 2019-04-19 (×3): qty 1

## 2019-04-19 MED ORDER — SODIUM CHLORIDE 0.9 % IV SOLN
2.0000 g | INTRAVENOUS | Status: AC
  Administered 2019-04-19 – 2019-04-24 (×6): 2 g via INTRAVENOUS
  Filled 2019-04-19 (×5): qty 20
  Filled 2019-04-19: qty 2
  Filled 2019-04-19: qty 20

## 2019-04-19 NOTE — Progress Notes (Signed)
Physical Therapy Treatment Patient Details Name: [name] MRN: [id number] DOB: 12/17/1955 Today's Date: 04/19/2019    History of Present Illness 63 y/o male w/ hx liver disorder, HTN, HLD, R BKA, GERD presented to ED with SOB, cough, fever and chills, admitted with dx acute hypoxic resp failure sec to SARS COVID 19.His follow-up CT chest showed segmental and subsegmental pulmonary embolism within branches of the right lower lobe.    PT Comments    Pt having great difficulty with donning prosthesis today, he takes increasingly long time to complete task and in end prosthesis did not seem to fit. Pt was on 15L/min via HFNC at start of session and while attempting to don prosthesis pt was noted to take several long breathing breaks, 02 sats decreased to a min of 80% (while on 15L/min via HFNC). Pt was placed on additional supplemental 02 via NRB and sats increased to 90s. Even with this pt still needing frequent rest breaks to complete tasks given. Pt left sitting in recliner with all needs in reach and 02 sats in 90s back on 15L/min of HFNC.     Follow Up Recommendations  CIR     Equipment Recommendations  Rolling walker with 5" wheels    Recommendations for Other Services       Precautions / Restrictions Precautions Precautions: Fall Precaution Comments: R BKA Required Braces or Orthoses: Other Brace(R prosthetic leg) Restrictions Weight Bearing Restrictions: No    Mobility  Bed Mobility               General bed mobility comments: pt recieved sitting EOB after breakfast  Transfers Overall transfer level: Needs assistance Equipment used: None Transfers: Sit to/from [facility] Sit to Stand: Supervision Stand pivot transfers: Supervision          Ambulation/Gait             General Gait Details: did not ambulate during this session was was not able to fully donn prosthesis   Stairs             Wheelchair Mobility     Modified Rankin (Stroke Patients Only)       Balance Overall balance assessment: Needs assistance Sitting-balance support: Feet unsupported Sitting balance-Leahy Scale: Good Sitting balance - Comments: sat EOB and attempted to donn prosthetic leg unsuccessfully   Standing balance support: During functional activity Standing balance-Leahy Scale: Fair                              Cognition Arousal/Alertness: Lethargic Behavior During Therapy: WFL for tasks assessed/performed Overall Cognitive Status: Impaired/Different from baseline Area of Impairment: Problem solving;Awareness;Safety/judgement;Memory;Attention                                      Exercises      General Comments        Pertinent Vitals/Pain Pain Assessment: No/denies pain    Home Living                      Prior Function            PT Goals (current goals can now be found in the care plan section) Progress towards PT goals: Not progressing toward goals - comment    Frequency    Min 3X/week      PT Plan Discharge plan  needs to be updated    Co-evaluation              AM-PAC PT "6 Clicks" Mobility   Outcome Measure  Help needed turning from your back to your side while in a flat bed without using bedrails?: None Help needed moving from lying on your back to sitting on the side of a flat bed without using bedrails?: None Help needed moving to and from a bed to a chair (including a wheelchair)?: A Little Help needed standing up from a chair using your arms (e.g., wheelchair or bedside chair)?: A Little Help needed to walk in hospital room?: A Little Help needed climbing 3-5 steps with a railing? : A Lot 6 Click Score: 19    End of Session   Activity Tolerance: Treatment limited secondary to medical complications (Comment) Patient left: in chair;with call bell/phone within reach   PT Visit Diagnosis: Muscle weakness (generalized)  (M62.81);Other abnormalities of gait and mobility (R26.89)     Time: [id number] PT Time Calculation (min) (ACUTE ONLY): 21 min  Charges:  $Self Care/Home Management: [place], PT    [name] 04/19/2019, 12:57 PM

## 2019-04-19 NOTE — Progress Notes (Signed)
[place] for Lovenox>Xarelto Indication: pulmonary embolus   No Known Allergies  Patient Measurements: Height: 5\' 6"  (167.6 cm) Weight: 230 lb (104.3 kg) IBW/kg (Calculated) : 63.8  Vital Signs: Temp: 98.3 F (36.8 C) (11/26 0800) Temp Source: Oral (11/26 0800) BP: 107/73 (11/26 0800) Pulse Rate: 101 (11/26 0800)  Labs: Recent Labs    04/17/19 0411 04/18/19 0629 04/19/19 0600  CREATININE 1.11 1.01 1.02    Estimated Creatinine Clearance: 83.9 mL/min (by C-G formula based on SCr of 1.02 mg/dL).   Medical History: Past Medical History:  Diagnosis Date  . GERD (gastroesophageal reflux disease)   . 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 Hyperlipidemia   . Hypertension   . Liver disorder    tx with ursodiol    Assessment: 63 yo M admitted with COVID-19 PNA and PE. Pharmacy consulted to dose Lovenox then transition to [place].   Patient still requiring significant O2. Hgb has been stable. We will repeat one tomorrow. Scr~1   Plan:  Continue Xarelto 15 mg bid x 21 days followed by 20 mg daily thereafter Monitor CBC, renal function, s/s of bleeding  Onnie Boer, PharmD, BCIDP, AAHIVP, CPP Infectious Disease Pharmacist 04/19/2019 9:35 AM

## 2019-04-19 NOTE — Progress Notes (Signed)
Spoke with pts son.  Updated on pt status.  Worked with PT today and currently in chair watching tv.  All questions answered.

## 2019-04-19 NOTE — Progress Notes (Signed)
PROGRESS NOTE    [name]  [id number] DOB: January 02, 1956 DOA: 04/08/2019 PCP: [name], FNP    Brief Narrative:  63 year old male who presented with dyspnea, he does have significant past medical history for hypertension, dyslipidemia and right BKA. Patient reported 4 days of shortness of breath, associated with productive cough, fevers, andchills.He was treated as an outpatient with prednisone and oral antibiotics but due to persistent symptoms he came to the hospital for further evaluation. His initial physical examination his blood pressure was 115/77, pulse rate 98, respiratory 21, oxygen saturation 88%. His lungs had no rhonchi or rails, heart S1-S2 present rhythmic, abdomen soft, no lower extremity edema. SARS COVID-19 was positive. Chest radiograph(personally reviewed), faint infiltrate left upper lobe interstitial.  Patient was admitted to the hospital working diagnosis of acute hypoxic respiratory failure due to SARS COVID-19 viral pneumonia.  Patient has been treated with systemic steroids and remdesivir. Due to persistent and worsening hypoxic respiratory failure he received Actemra and convalescent plasma.  His follow-up CT chest showed segmental and subsegmental pulmonary embolism within branches of the right lower lobe.  Patient continue to have a slow recovery, continue with high oxygen requirements.  Follow up chest radiograph from 11/20 with positive signs of pulmonary edema,diuresed with furosemide. Achieved negative fluid balance, with no significant improvement of oxygenation.  Continue to have persistent high oxygen requirements, likely lung injury from [place] 19.  Patient with persistent episodic desaturation below 88% specially with activity.   Follow up chest radiograph with evidence of bacterial co infection, added IV ceftriaxone on 11/25.    Assessment & Plan:   Principal Problem:   Pneumonia due to COVID-19 virus Active  Problems:   Acute respiratory failure with hypoxia (HCC)   Essential hypertension   HLD (hyperlipidemia)   Community acquired pneumonia   Transaminitis   Hyperglycemia   AKI (acute kidney injury) ([place])   Bilateral pulmonary embolism ([place])   1.Acute hypoxic respiratory failure due to SARS COVID-19 viral pneumonia. Sp convalescent plasma and actemra on 04/10/19.Completed 5 doses of Remdesivir 11/20.  RR: 16  Pulse oxymetry: 91%  Fi02: 15 LPM per HFNC  COVID-19 Labs  Recent Labs    04/17/19 0411 04/18/19 0629 04/19/19 0600  DDIMER >20.00* >20.00* >20.00*  FERRITIN 200 239 188  CRP 4.4* 3.9* 2.0*    Lab Results  Component Value Date   SARSCOV2NAA POSITIVE (A) 04/08/2019   SARSCOV2NAA Detected (A) 04/04/2019    Improving inflammatory markers, persistent high d dimer, indictor of poor prognostic factor.   Completed 10 days of IV dexamethasone 6 mg q 24 H, will start slow taper to prednisone 20 mg daily. On antitussive agents, bronchodilators, and airway clearing techniques with flutter valve/ incentive spirometer.  On flonase and saline nasal spry for nasal congestion.    Suspected bacterial co-infection/ pneumonia (not present on admission). Patient with increase mucous production, will continue with IV ceftriaxone #2/8  Patient does have ahigh risk for worsening respiratory failure.  2, NEW focal neurological deficit. CT head negative, and right hand fine motor function has been stable, will plan to repeat head CT if worsening or recurrent symptoms. Patient is high risk for respiratory decompensation if travel for MRI to [facility].   2. Acute pulmonary embolism. echocardiogram with preserved left and right systolic function.Onrivaroxaban with good toleration.   3. T2DM with steroid induced hyperglycemia.Fasting glucosetodayis116. Currently on insulin sliding scale for glucose cover and monitoring. Tolerating po well.   4. Dyslipidemia.Continue  withatorvastatin.  5. BPH.Continue  withflomax.  6. Obesity.His caculatedBMI is 37,12,   7. 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 Renal function and electrolytes have been stable.     DVT prophylaxis: rivaroxaban   Code Status:  full Family Communication: no family at the bedside  Disposition Plan/ discharge barriers: pending clinical improvement.   Body mass index is 37.12 kg/m. Malnutrition Type:      Malnutrition Characteristics:      Nutrition Interventions:     RN Pressure Injury Documentation:     Consultants:     Procedures:     Antimicrobials:       Subjective: Patient continue to have dyspnea, specially on exertion, continue to have high oxygen requirements and now with worsening productive cough, no nausea or vomiting. No chest pain.   Objective: Vitals:   04/19/19 0500 04/19/19 0600 04/19/19 0700 04/19/19 0800  BP: 116/82 132/81 126/73 107/73  Pulse: 72 83 (!) 105 (!) 101  Resp:    16  Temp:    98.3 F (36.8 C)  TempSrc:    Oral  SpO2: 94% (!) 87% 90% 91%  Weight:      Height:        Intake/Output Summary (Last 24 hours) at 04/19/2019 0900 Last data filed at 04/18/2019 1700 Gross per 24 hour  Intake 1061.01 ml  Output 910 ml  Net 151.01 ml   Filed Weights   04/09/19 0300  Weight: 104.3 kg    Examination:   General: deconditioned and positive dyspnea at rest.  Neurology: Awake and alert, non focal  E ENT: mild pallor, no icterus, oral mucosa moist Cardiovascular: No JVD. S1-S2 present, rhythmic, no gallops, rubs, or murmurs. No lower extremity edema. Pulmonary: positive breath sounds bilaterally. Gastrointestinal. Abdomen with no organomegaly, non tender, no rebound or guarding Skin. No rashes Musculoskeletal: right BKA     Data Reviewed: I have personally reviewed following labs and imaging studies  CBC: Recent Labs  Lab 04/13/19 0135  WBC 21.0*  HGB 15.5  HCT 46.9  MCV 93.1  PLT 123456   Basic Metabolic Panel:  Recent Labs  Lab 04/15/19 0320 04/16/19 0346 04/17/19 0411 04/18/19 0629 04/19/19 0600  NA 136 139 138 139 137  K 4.5 4.4 5.0 4.5 4.5  CL 99 99 101 102 100  CO2 26 27 27 23 25   GLUCOSE 112* 100* 125* 119* 116*  BUN 20 21 21 21 22   CREATININE 1.02 1.05 1.11 1.01 1.02  CALCIUM 8.4* 8.8* 8.5* 8.6* 8.6*   GFR: Estimated Creatinine Clearance: 83.9 mL/min (by C-G formula based on SCr of 1.02 mg/dL). Liver Function Tests: Recent Labs  Lab 04/15/19 0320 04/16/19 0346 04/17/19 0411 04/18/19 0629 04/19/19 0600  AST 59* 51* 52* 61* 53*  ALT 51* 48* 39 42 39  ALKPHOS 221* 259* 239* 273* 238*  BILITOT 1.4* 1.2 1.5* 1.5* 1.1  PROT 6.8 6.6 6.3* 7.0 6.7  ALBUMIN 3.4* 3.3* 3.3* 3.5 3.5   No results for input(s): LIPASE, AMYLASE in the last 168 hours. No results for input(s): AMMONIA in the last 168 hours. Coagulation Profile: No results for input(s): INR, PROTIME in the last 168 hours. Cardiac Enzymes: No results for input(s): CKTOTAL, CKMB, CKMBINDEX, TROPONINI in the last 168 hours. BNP (last 3 results) No results for input(s): PROBNP in the last 8760 hours. HbA1C: No results for input(s): HGBA1C in the last 72 hours. CBG: Recent Labs  Lab 04/17/19 2046 04/18/19 0738 04/18/19 1157 04/18/19 1601 04/18/19 2050  GLUCAP 177* 137* 273* 213* 167*  Lipid Profile: No results for input(s): CHOL, HDL, LDLCALC, TRIG, CHOLHDL, LDLDIRECT in the last 72 hours. Thyroid Function Tests: No results for input(s): TSH, T4TOTAL, FREET4, T3FREE, THYROIDAB in the last 72 hours. Anemia Panel: Recent Labs    04/18/19 0629 04/19/19 0600  FERRITIN 239 188      Radiology Studies: I have reviewed all of the imaging during this hospital visit personally     Scheduled Meds: . aspirin  81 mg Oral Daily  . atorvastatin  40 mg Oral q1800  . chlorpheniramine-HYDROcodone  5 mL Oral Q12H  . dexamethasone (DECADRON) injection  6 mg Intravenous Q24H  . famotidine  20 mg Oral BID  .  fluticasone  2 spray Each Nare BID  . insulin aspart  0-9 Units Subcutaneous TID WC  . Ipratropium-Albuterol  1 puff Inhalation Q6H  . Rivaroxaban  15 mg Oral BID WC  . [START ON 05/03/2019] rivaroxaban  20 mg Oral Q supper  . tamsulosin  0.4 mg Oral QPC supper  . ursodiol  300 mg Oral BID  . vitamin C  500 mg Oral Daily  . zinc sulfate  220 mg Oral Daily   Continuous Infusions: . cefTRIAXone (ROCEPHIN)  IV Stopped (04/18/19 1320)     LOS: 10 days         Gerome Apley, MD

## 2019-04-19 NOTE — Plan of Care (Signed)
  Problem: Education: Goal: Knowledge of risk factors and measures for prevention of condition will improve Outcome: Progressing   Problem: Coping: Goal: Psychosocial and spiritual needs will be supported Outcome: Progressing   Problem: Respiratory: Goal: Will maintain a patent airway Outcome: Progressing   

## 2019-04-20 LAB — C-REACTIVE PROTEIN: CRP: 0.9 mg/dL (ref <1.0)

## 2019-04-20 LAB — COMPREHENSIVE METABOLIC PANEL
ALT: 40 U/L (ref 0–44)
AST: 49 U/L — ABNORMAL HIGH (ref 15–41)
Albumin: 3.4 g/dL — ABNORMAL LOW (ref 3.5–5.0)
Alkaline Phosphatase: 197 U/L — ABNORMAL HIGH (ref 38–126)
Anion gap: 12 (ref 5–15)
BUN: 20 mg/dL (ref 8–23)
CO2: 25 mmol/L (ref 22–32)
Calcium: 8.6 mg/dL — ABNORMAL LOW (ref 8.9–10.3)
Chloride: 101 mmol/L (ref 98–111)
Creatinine, Ser: 0.94 mg/dL (ref 0.61–1.24)
GFR calc Af Amer: >60 mL/min (ref >60)
GFR calc non Af Amer: >60 mL/min (ref >60)
Glucose, Bld: 98 mg/dL (ref 70–99)
Potassium: 4.9 mmol/L (ref 3.5–5.1)
Sodium: 138 mmol/L (ref 135–145)
Total Bilirubin: 1.3 mg/dL — ABNORMAL HIGH (ref 0.3–1.2)
Total Protein: 6.7 g/dL (ref 6.5–8.1)

## 2019-04-20 LAB — CBC
HCT: 48.6 % (ref 39.0–52.0)
Hemoglobin: 16.2 g/dL (ref 13.0–17.0)
MCH: 31.6 pg (ref 26.0–34.0)
MCHC: 33.3 g/dL (ref 30.0–36.0)
MCV: 94.9 fL (ref 80.0–100.0)
Platelets: 56 10*3/uL — ABNORMAL LOW (ref 150–400)
RBC: 5.12 MIL/uL (ref 4.22–5.81)
RDW: 14.6 % (ref 11.5–15.5)
WBC: 21.6 10*3/uL — ABNORMAL HIGH (ref 4.0–10.5)
nRBC: 0.2 % (ref 0.0–0.2)

## 2019-04-20 LAB — FERRITIN: Ferritin: 219 ng/mL (ref 24–336)

## 2019-04-20 LAB — GLUCOSE, CAPILLARY
Glucose-Capillary: 128 mg/dL — ABNORMAL HIGH (ref 70–99)
Glucose-Capillary: 156 mg/dL — ABNORMAL HIGH (ref 70–99)
Glucose-Capillary: 220 mg/dL — ABNORMAL HIGH (ref 70–99)
Glucose-Capillary: 154 mg/dL — ABNORMAL HIGH (ref 70–99)

## 2019-04-20 LAB — D-DIMER, QUANTITATIVE: D-Dimer, Quant: >20 ug/mL-FEU — ABNORMAL HIGH (ref 0.00–0.50)

## 2019-04-20 NOTE — Progress Notes (Addendum)
PROGRESS NOTE    [name]  [id number] DOB: 1955-09-16 DOA: 04/08/2019 PCP: [name], FNP    Brief Narrative:  63 year old male who presented with dyspnea, he does have significant past medical history for hypertension, dyslipidemia and right BKA. Patient reported 4 days of shortness of breath, associated with productive cough, fevers, andchills.He was treated as an outpatient with prednisone and oral antibiotics but due to persistent symptoms he came to the hospital for further evaluation. His initial physical examination his blood pressure was 115/77, pulse rate 98, respiratory 21, oxygen saturation 88%. His lungs had no rhonchi or rails, heart S1-S2 present rhythmic, abdomen soft, no lower extremity edema. SARS COVID-19 was positive. Chest radiograph(personally reviewed), faint infiltrate left upper lobe interstitial.  Patient was admitted to the hospital working diagnosis of acute hypoxic respiratory failure due to SARS COVID-19 viral pneumonia.  Patient has been treated with systemic steroids and remdesivir. Due to persistent and worsening hypoxic respiratory failure he received Actemra and convalescent plasma.  His follow-up CT chest showed segmental and subsegmental pulmonary embolism within branches of the right lower lobe.  Patient continue to have a slow recovery, continue with high oxygen requirements.  Follow up chest radiograph from 11/20 with positive signs of pulmonary edema,diuresed with furosemide. Achieved negative fluid balance, with no significant improvement of oxygenation.  Continue to have persistent high oxygen requirements, likely lung injury from [place] 19.  Patient with persistent episodic desaturation below 88% specially with activity.  Follow up chest radiograph with evidence of bacterial co infection, added IV ceftriaxone on 11/25.   Patient with very slow progress, persistent hypoxic respiratory failure with high  oxygen requirements.    Assessment & Plan:   Principal Problem:   Pneumonia due to COVID-19 virus Active Problems:   Acute respiratory failure with hypoxia (HCC)   Essential hypertension   HLD (hyperlipidemia)   Community acquired pneumonia   Transaminitis   Hyperglycemia   AKI (acute kidney injury) ([place])   Bilateral pulmonary embolism (HCC)   Lobar pneumonia ([place])   1.Acute hypoxic respiratory failure due to SARS COVID-19 viral pneumonia. Sp convalescent plasma and actemra on 04/10/19.Completed 5 doses of Remdesivir 11/20.  RR:15  Pulse oxymetry: 92%  Fi02: 15 LPM per HFNC   COVID-19 Labs  Recent Labs    04/18/19 0629 04/19/19 0600 04/20/19 0555  DDIMER >20.00* >20.00* >20.00*  FERRITIN 239 188 219  CRP 3.9* 2.0* 0.9    Lab Results  Component Value Date   SARSCOV2NAA POSITIVE (A) 04/08/2019   SARSCOV2NAA Detected (A) 04/04/2019    Stable inflammatory markers.   Continue with slow taper of  Prednisone, continue with  20 mg daily. Continue with antitussive agents, bronchodilators, and airway clearing techniques with flutter valve/ incentive spirometer.  Nasal decongestants with flonase and saline nasal spry for nasal congestion.   Suspected bacterial co-infection/ pneumonia (not present on admission). Tolerating well antibiotic therapy with IV ceftriaxone #3/8. WBC trending down to 21 from 22, continue to follow cell count in am.   Patient continue to have high risk for worsening respiratory failure.  2, NEW focal neurological deficit.CT head negative, and right hand fine motor function has been stable,. Continue close neuro follow up.  2. Acute pulmonary embolism. echocardiogram with preserved left and right systolic function.Tolerating wellrivaroxaban.   3. T2DM with resolved steroid induced hyperglycemia. Glucose continue to be well controlled, continue with insulin sliding scale. Patient continue tolerating po well.  4.  Dyslipidemia.Onatorvastatin.  5. BPH.Onflomax.  6. Obesity.BMI is  37,12,   7. 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 resolved AKI, continue to avoid nephrotoxic medications and hypotension.   8. New thrombocytopenia. Plt down to 62 and 56 from 256. Likely reactive, patient not on heparin at this time, will continue anticoagulation with rivaroxaban for now.   DVT prophylaxis: rivaroxaban   Code Status:  full Family Communication: no family at the bedside  Disposition Plan/ discharge barriers: pending clinical improvement.    Body mass index is 37.12 kg/m. Malnutrition Type:      Malnutrition Characteristics:      Nutrition Interventions:     RN Pressure Injury Documentation:     Consultants:     Procedures:     Antimicrobials:   Ceftriaxone #3/8    Subjective: Patient continue to have dyspnea worse on exertion, no nausea or vomiting, continue to have cough, but no improved nasal congestion. No nausea or vomiting. Continue to be very weak and deconditioned.  Objective: Vitals:   04/19/19 1700 04/19/19 2000 04/20/19 0452 04/20/19 0828  BP:  (!) 145/88 125/69 (!) 149/76  Pulse:  (!) 101 94 (!) 103  Resp:   15   Temp:  (!) 96.8 F (36 C) 98.4 F (36.9 C) (!) 97.1 F (36.2 C)  TempSrc:  Axillary Axillary Axillary  SpO2: 94% 97% 92% (!) 77%  Weight:      Height:        Intake/Output Summary (Last 24 hours) at 04/20/2019 0856 Last data filed at 04/20/2019 0200 Gross per 24 hour  Intake 340 ml  Output 350 ml  Net -10 ml   Filed Weights   04/09/19 0300  Weight: 104.3 kg    Examination:   General: deconditioned and positive dyspnea at rest.  Neurology: Awake and alert, non focal  E ENT: mild pallor, no icterus, oral mucosa moist Cardiovascular: No JVD. S1-S2 present, rhythmic, no gallops, rubs, or murmurs. No lower extremity edema. Pulmonary: positive breath sounds bilaterally. Gastrointestinal. Abdomen with no organomegaly, non tender, no rebound  or guarding Skin. No rashes Musculoskeletal: right BKA     Data Reviewed: I have personally reviewed following labs and imaging studies  CBC: Recent Labs  Lab 04/19/19 0430  WBC 22.3*  HGB 15.7  HCT 47.2  MCV 95.5  PLT 62*   Basic Metabolic Panel: Recent Labs  Lab 04/16/19 0346 04/17/19 0411 04/18/19 0629 04/19/19 0600 04/20/19 0555  NA 139 138 139 137 138  K 4.4 5.0 4.5 4.5 4.9  CL 99 101 102 100 101  CO2 27 27 23 25 25   GLUCOSE 100* 125* 119* 116* 98  BUN 21 21 21 22 20   CREATININE 1.05 1.11 1.01 1.02 0.94  CALCIUM 8.8* 8.5* 8.6* 8.6* 8.6*   GFR: Estimated Creatinine Clearance: 91 mL/min (by C-G formula based on SCr of 0.94 mg/dL). Liver Function Tests: Recent Labs  Lab 04/16/19 0346 04/17/19 0411 04/18/19 0629 04/19/19 0600 04/20/19 0555  AST 51* 52* 61* 53* 49*  ALT 48* 39 42 39 40  ALKPHOS 259* 239* 273* 238* 197*  BILITOT 1.2 1.5* 1.5* 1.1 1.3*  PROT 6.6 6.3* 7.0 6.7 6.7  ALBUMIN 3.3* 3.3* 3.5 3.5 3.4*   No results for input(s): LIPASE, AMYLASE in the last 168 hours. No results for input(s): AMMONIA in the last 168 hours. Coagulation Profile: No results for input(s): INR, PROTIME in the last 168 hours. Cardiac Enzymes: No results for input(s): CKTOTAL, CKMB, CKMBINDEX, TROPONINI in the last 168 hours. BNP (last 3 results) No results for input(s): PROBNP  in the last 8760 hours. HbA1C: No results for input(s): HGBA1C in the last 72 hours. CBG: Recent Labs  Lab 04/19/19 0845 04/19/19 1239 04/19/19 1557 04/19/19 2133 04/20/19 0826  GLUCAP 103* 196* 225* 141* 128*   Lipid Profile: No results for input(s): CHOL, HDL, LDLCALC, TRIG, CHOLHDL, LDLDIRECT in the last 72 hours. Thyroid Function Tests: No results for input(s): TSH, T4TOTAL, FREET4, T3FREE, THYROIDAB in the last 72 hours. Anemia Panel: Recent Labs    04/19/19 0600 04/20/19 0555  FERRITIN 188 219      Radiology Studies: I have reviewed all of the imaging during this  hospital visit personally     Scheduled Meds: . aspirin  81 mg Oral Daily  . atorvastatin  40 mg Oral q1800  . chlorpheniramine-HYDROcodone  5 mL Oral Q12H  . famotidine  20 mg Oral BID  . fluticasone  2 spray Each Nare BID  . insulin aspart  0-9 Units Subcutaneous TID WC  . Ipratropium-Albuterol  1 puff Inhalation Q6H  . predniSONE  20 mg Oral Q breakfast  . Rivaroxaban  15 mg Oral BID WC  . [START ON 05/03/2019] rivaroxaban  20 mg Oral Q supper  . tamsulosin  0.4 mg Oral QPC supper  . ursodiol  300 mg Oral BID  . vitamin C  500 mg Oral Daily  . zinc sulfate  220 mg Oral Daily   Continuous Infusions: . cefTRIAXone (ROCEPHIN)  IV Stopped (04/19/19 1436)     LOS: 11 days         Gerome Apley, MD

## 2019-04-20 NOTE — Progress Notes (Signed)
[place] for Lovenox>Xarelto Indication: pulmonary embolus   No Known Allergies  Patient Measurements: Height: 5\' 6"  (167.6 cm) Weight: 230 lb (104.3 kg) IBW/kg (Calculated) : 63.8  Vital Signs: Temp: 97.1 F (36.2 C) (11/27 0828) Temp Source: Axillary (11/27 0828) BP: 149/76 (11/27 0828) Pulse Rate: 103 (11/27 0828)  Labs: Recent Labs    04/18/19 0629 04/19/19 0430 04/19/19 0600 04/20/19 0555  HGB  --  15.7  --  16.2  HCT  --  47.2  --  48.6  PLT  --  62*  --  56*  CREATININE 1.01  --  1.02 0.94    Estimated Creatinine Clearance: 91 mL/min (by C-G formula based on SCr of 0.94 mg/dL).   Medical History: Past Medical History:  Diagnosis Date  . GERD (gastroesophageal reflux disease)   . 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 Hyperlipidemia   . Hypertension   . Liver disorder    tx with ursodiol    Assessment: 63 yo M admitted with COVID-19 PNA and PE. Pharmacy consulted to dose Lovenox then transition to [place].   SCr 0.94 stable, Hgb stable, Plt 56 4T score ~ 6   Plan:  -Messaged MD about "high probability" 4T score though other possible reasons for thrombocytopenia -Continue Xarelto 15 mg bid x 21 days followed by 20 mg daily thereafter -Education complet -Will sign off consult and continue to monitor peripherally  Thank you for the consult  Ulice Dash, PharmD, BCPS Clinical Pharmacist  04/20/2019 11:05 AM

## 2019-04-20 NOTE — Plan of Care (Signed)

## 2019-04-21 LAB — CBC WITH DIFFERENTIAL/PLATELET
Abs Immature Granulocytes: 0.53 10*3/uL — ABNORMAL HIGH (ref 0.00–0.07)
Basophils Absolute: 0.1 10*3/uL (ref 0.0–0.1)
Basophils Relative: 0 %
Eosinophils Absolute: 0.2 10*3/uL (ref 0.0–0.5)
Eosinophils Relative: 1 %
HCT: 49.6 % (ref 39.0–52.0)
Hemoglobin: 16.6 g/dL (ref 13.0–17.0)
Immature Granulocytes: 3 %
Lymphocytes Relative: 8 %
Lymphs Abs: 1.7 10*3/uL (ref 0.7–4.0)
MCH: 31.7 pg (ref 26.0–34.0)
MCHC: 33.5 g/dL (ref 30.0–36.0)
MCV: 94.7 fL (ref 80.0–100.0)
Monocytes Absolute: 1.1 10*3/uL — ABNORMAL HIGH (ref 0.1–1.0)
Monocytes Relative: 5 %
Neutro Abs: 17.8 10*3/uL — ABNORMAL HIGH (ref 1.7–7.7)
Neutrophils Relative %: 83 %
Platelets: 53 10*3/uL — ABNORMAL LOW (ref 150–400)
RBC: 5.24 MIL/uL (ref 4.22–5.81)
RDW: 14.9 % (ref 11.5–15.5)
WBC: 21.4 10*3/uL — ABNORMAL HIGH (ref 4.0–10.5)
nRBC: 0.6 % — ABNORMAL HIGH (ref 0.0–0.2)

## 2019-04-21 LAB — BASIC METABOLIC PANEL
Anion gap: 13 (ref 5–15)
BUN: 19 mg/dL (ref 8–23)
CO2: 24 mmol/L (ref 22–32)
Calcium: 8.6 mg/dL — ABNORMAL LOW (ref 8.9–10.3)
Chloride: 100 mmol/L (ref 98–111)
Creatinine, Ser: 1.08 mg/dL (ref 0.61–1.24)
GFR calc Af Amer: >60 mL/min (ref >60)
GFR calc non Af Amer: >60 mL/min (ref >60)
Glucose, Bld: 101 mg/dL — ABNORMAL HIGH (ref 70–99)
Potassium: 4.9 mmol/L (ref 3.5–5.1)
Sodium: 137 mmol/L (ref 135–145)

## 2019-04-21 LAB — GLUCOSE, CAPILLARY
Glucose-Capillary: 183 mg/dL — ABNORMAL HIGH (ref 70–99)
Glucose-Capillary: 116 mg/dL — ABNORMAL HIGH (ref 70–99)
Glucose-Capillary: 122 mg/dL — ABNORMAL HIGH (ref 70–99)

## 2019-04-21 NOTE — Progress Notes (Signed)
PROGRESS NOTE    [name]  [id number] DOB: 1956/03/29 DOA: 04/08/2019 PCP: [name], FNP    Brief Narrative:  63 year old male who presented with dyspnea, he does have significant past medical history for hypertension, dyslipidemia and right BKA. Patient reported 4 days of shortness of breath, associated with productive cough, fevers, andchills.He was treated as an outpatient with prednisone and oral antibiotics but due to persistent symptoms he came to the hospital for further evaluation. His initial physical examination his blood pressure was 115/77, pulse rate 98, respiratory 21, oxygen saturation 88%. His lungs had no rhonchi or rails, heart S1-S2 present rhythmic, abdomen soft, no lower extremity edema. SARS COVID-19 was positive. Chest radiograph(personally reviewed), faint infiltrate left upper lobe interstitial.  Patient was admitted to the hospital working diagnosis of acute hypoxic respiratory failure due to SARS COVID-19 viral pneumonia.  Patient has been treated with systemic steroids and remdesivir. Due to persistent and worsening hypoxic respiratory failure he received Actemra and convalescent plasma.  His follow-up CT chest showed segmental and subsegmental pulmonary embolism within branches of the right lower lobe.  Patient continue to have a slow recovery, continue with high oxygen requirements.  Follow up chest radiograph from 11/20 with positive signs of pulmonary edema,diuresed with furosemide. Achieved negative fluid balance, with no significant improvement of oxygenation.  Continue to have persistent high oxygen requirements, likely lung injury from [place] 19.  Patient with persistent episodic desaturation below 88% specially with activity.  Follow up chest radiograph with evidence of bacterial co infection, added IV ceftriaxone on 11/25.  Patient with very slow progress, persistent hypoxic respiratory failure with high  oxygen requirements.    Assessment & Plan:   Principal Problem:   Pneumonia due to COVID-19 virus Active Problems:   Acute respiratory failure with hypoxia (HCC)   Essential hypertension   HLD (hyperlipidemia)   Community acquired pneumonia   Transaminitis   Hyperglycemia   AKI (acute kidney injury) ([place])   Bilateral pulmonary embolism (HCC)   Lobar pneumonia ([place])   1.Acute hypoxic respiratory failure due to SARS COVID-19 viral pneumonia. Sp convalescent plasma and actemra on 04/10/19.Completed 5 doses of Remdesivir 11/20.  RR: 19  Pulse oxymetry: 93%  Fi02: 8 LPM  COVID-19 Labs  Recent Labs    04/19/19 0600 04/20/19 0555  DDIMER >20.00* >20.00*  FERRITIN 188 219  CRP 2.0* 0.9    Lab Results  Component Value Date   SARSCOV2NAA POSITIVE (A) 04/08/2019   SARSCOV2NAA Detected (A) 04/04/2019    Patient with persistent d dimer elevation, CRP continue to trend down.   Prednisone slow taper, continue with 20 mg daily.On antitussive agents, bronchodilators, and airway clearing techniques with flutter valve/incentive spirometer.  Continue with flonase and saline nasal spry for nasal congestion.(had 3 days of afrin)  Suspected bacterial co-infection/ pneumonia (not present on admission). Improved oxygen requirements and cough, will continue with antibiotic therapy with IV ceftriaxone#4/8. Stable wbc at 21.4 from 21.6.    Continue to be at high risk for worsening respiratory failure.  2, NEW focal neurological deficit.CT head negative, and right hand fine motor function has been stable,. No worsening symptoms, continue neuro checks. If persistent to consider repeat head CT.   2. Acute pulmonary embolism. Complicated with new thrombocytopeniam. echocardiogram with preserved left and right systolic function.Continue withrivaroxaban.Platelets have been stable at 56 to 53, risk vs benefit, will continue anticoagulation with rivaroxaban. Patient now is off  heparin.   3. T2DM with resolved steroid induced hyperglycemia. Fasting  glucose is 101, will continue insulin sliding scale for glucose cover and monitoring. Patient is tolerating po well.   4. Dyslipidemia.Continue withatorvastatin.  5. BPH.Continue withflomax.  6. Obesity.BMI calculated is 37,12,   7. 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   Body mass index is 37.12 kg/m. Malnutrition Type:      Malnutrition Characteristics:      Nutrition Interventions:     RN Pressure Injury Documentation:     Consultants:     Procedures:     Antimicrobials:   Ceftriaxone    Subjective: Patient is feeling better, but continue to have dyspnea on exertion, no nausea or vomiting, no chest pain. Nasal congestion has improved.   Objective: Vitals:   04/21/19 0011 04/21/19 0237 04/21/19 0527 04/21/19 0739  BP: 126/73   (!) 137/98  Pulse:  79 85 97  Resp: 20  19 19   Temp: (!) 97.5 F (36.4 C)  97.7 F (36.5 C) 97.6 F (36.4 C)  TempSrc: Axillary  Axillary Oral  SpO2: 91% 100% 100% 100%  Weight:      Height:        Intake/Output Summary (Last 24 hours) at 04/21/2019 0849 Last data filed at 04/21/2019 0200 Gross per 24 hour  Intake 580 ml  Output 400 ml  Net 180 ml   Filed Weights   04/09/19 0300  Weight: 104.3 kg    Examination:   General: deconditioned  Neurology: Awake and alert, non focal  E ENT: no pallor, no icterus, oral mucosa moist Cardiovascular: No JVD. S1-S2 present, rhythmic, no gallops, rubs, or murmurs. No lower extremity edema. Pulmonary: positive breath sounds bilaterally. Gastrointestinal. Abdomen with no organomegaly, non tender, no rebound or guarding Skin. No rashes Musculoskeletal: no joint deformities      Data Reviewed: I have personally reviewed following labs and imaging studies  CBC: Recent Labs  Lab 04/19/19 0430 04/20/19 0555 04/21/19 0535  WBC 22.3* 21.6* 21.4*  NEUTROABS  --   --  17.8*  HGB 15.7 16.2 16.6  HCT 47.2 48.6 49.6  MCV 95.5 94.9 94.7  PLT 62* 56* 53*   Basic Metabolic Panel: Recent Labs  Lab 04/17/19 0411 04/18/19 0629 04/19/19 0600 04/20/19 0555 04/21/19 0535  NA 138 139 137 138 137  K 5.0 4.5 4.5 4.9 4.9  CL 101 102 100 101 100  CO2 27 23 25 25 24   GLUCOSE 125* 119* 116* 98 101*  BUN 21 21 22 20 19   CREATININE 1.11 1.01 1.02 0.94 1.08  CALCIUM 8.5* 8.6* 8.6* 8.6* 8.6*   GFR: Estimated Creatinine Clearance: 79.2 mL/min (by C-G formula based on SCr of 1.08 mg/dL). Liver Function Tests: Recent Labs  Lab 04/16/19 0346 04/17/19 0411 04/18/19 0629 04/19/19 0600 04/20/19 0555  AST 51* 52* 61* 53* 49*  ALT 48* 39 42 39 40  ALKPHOS 259* 239* 273* 238* 197*  BILITOT 1.2 1.5* 1.5* 1.1 1.3*  PROT 6.6 6.3* 7.0 6.7 6.7  ALBUMIN 3.3* 3.3* 3.5 3.5 3.4*   No results for input(s): LIPASE, AMYLASE in the last 168 hours. No results for input(s): AMMONIA in the last 168 hours. Coagulation Profile: No results for input(s): INR, PROTIME in the last 168 hours. Cardiac Enzymes: No results for input(s): CKTOTAL, CKMB, CKMBINDEX, TROPONINI in the last 168 hours. BNP (last 3 results) No results for input(s): PROBNP in the last  8760 hours. HbA1C: No results for input(s): HGBA1C in the last 72 hours. CBG: Recent Labs  Lab 04/19/19 2133 04/20/19 0826 04/20/19 1207 04/20/19 1634 04/20/19 2051  GLUCAP 141* 128* 156* 220* 154*   Lipid Profile: No results for input(s): CHOL, HDL, LDLCALC, TRIG, CHOLHDL, LDLDIRECT in the last 72 hours. Thyroid Function Tests: No results for input(s): TSH, T4TOTAL, FREET4, T3FREE, THYROIDAB in the last 72 hours. Anemia Panel: Recent Labs    04/19/19 0600 04/20/19 0555  FERRITIN 188 219      Radiology Studies: I have  reviewed all of the imaging during this hospital visit personally     Scheduled Meds: . aspirin  81 mg Oral Daily  . atorvastatin  40 mg Oral q1800  . chlorpheniramine-HYDROcodone  5 mL Oral Q12H  . famotidine  20 mg Oral BID  . fluticasone  2 spray Each Nare BID  . insulin aspart  0-9 Units Subcutaneous TID WC  . Ipratropium-Albuterol  1 puff Inhalation Q6H  . predniSONE  20 mg Oral Q breakfast  . Rivaroxaban  15 mg Oral BID WC  . [START ON 05/03/2019] rivaroxaban  20 mg Oral Q supper  . tamsulosin  0.4 mg Oral QPC supper  . ursodiol  300 mg Oral BID  . vitamin C  500 mg Oral Daily  . zinc sulfate  220 mg Oral Daily   Continuous Infusions: . cefTRIAXone (ROCEPHIN)  IV 2 g (04/20/19 1341)     LOS: 12 days        Tena Linebaugh Gerome Apley, MD

## 2019-04-21 NOTE — Progress Notes (Signed)
Physical Therapy Treatment Patient Details Name: [name] MRN: [id number] DOB: 1955-09-18 Today's Date: 04/21/2019    History of Present Illness 63 y/o male w/ hx liver disorder, HTN, HLD, R BKA, GERD presented to ED with SOB, cough, fever and chills, admitted with dx acute hypoxic resp failure sec to SARS COVID 19.His follow-up CT chest showed segmental and subsegmental pulmonary embolism within branches of the right lower lobe.    PT Comments    Pt was weaned down to 6L at rest, however noted incr WOB despite sats 94%. Donned pt prosthesis for him to conserve energy. Unable to don left shoe due to incr edema and wouldn't fit (RN made aware).  During session incr to 8L and then 10L due to WOB and HR increasing 30 bpm with standing only.  Incr time required for all activity to allow sats, RR, and HR to stabilize. Education on pursed lip breathing (pt continues to have difficulty coordinating this) and mindfulness to decr RR and HR. By end of session back down to 6L with pt listening to jazz music and sats 96%.    Follow Up Recommendations  CIR     Equipment Recommendations  Rolling walker with 5" wheels    Recommendations for Other Services       Precautions / Restrictions Precautions Precautions: Fall Precaution Comments: R BKA Required Braces or Orthoses: Other Brace(R prosthetic leg) Restrictions Weight Bearing Restrictions: No    Mobility  Bed Mobility                  Transfers Overall transfer level: Needs assistance Equipment used: Rolling walker (2 wheeled) Transfers: Sit to/from Stand Sit to Stand: Min guard         General transfer comment: Sit/stand x 1 with prosthesis  Ambulation/Gait             General Gait Details: pt with incr WOB and HR with standing x 3 minutes   Stairs             Wheelchair Mobility    Modified Rankin (Stroke Patients Only)       Balance Overall balance assessment: Needs  assistance Sitting-balance support: Feet supported;No upper extremity supported Sitting balance-Leahy Scale: Good Sitting balance - Comments: edge of chair   Standing balance support: During functional activity Standing balance-Leahy Scale: Poor Standing balance comment: light use of bil UEs on RW                            Cognition Arousal/Alertness: Awake/alert Behavior During Therapy: WFL for tasks assessed/performed Overall Cognitive Status: Impaired/Different from baseline Area of Impairment: Problem solving                       Following Commands: Follows one step commands with increased time;Follows one step commands consistently     Problem Solving: Requires verbal cues;Slow processing General Comments: pt with some word-finding issues; incr time to complete his thoughts/sentences      Exercises Other Exercises Other Exercises: reinforced use of incentive spirometer and flutter valve Other Exercises: pursed lip breathing--pt required constant cues to utilize technique and has difficulty inhaling through his nose (even after nose spray) Other Exercises: educated on counting 1-4 during inhalation and exhalation; utilized jazz music via youtube to further decr RR/WOB    General Comments General comments (skin integrity, edema, etc.): Pt was weaned down to 6L at rest, however noted incr WOB  despite sats 94%. Incr time required for all activity to allow sats, RR, and HR to stabilize. Donned pt prosthesis for him to conserve energy. Unable to don left shoe due to incr edema and wouldn't fit (RN made aware).       Pertinent Vitals/Pain Pain Assessment: No/denies pain    Home Living                      Prior Function            PT Goals (current goals can now be found in the care plan section) Acute Rehab PT Goals Patient Stated Goal: get back to being able to walk and return to work Progress towards PT goals: Not progressing toward goals  - comment(limited by respiratory status)    Frequency    Min 3X/week      PT Plan Current plan remains appropriate    Co-evaluation              AM-PAC PT "6 Clicks" Mobility   Outcome Measure  Help needed turning from your back to your side while in a flat bed without using bedrails?: None Help needed moving from lying on your back to sitting on the side of a flat bed without using bedrails?: None Help needed moving to and from a bed to a chair (including a wheelchair)?: A Little Help needed standing up from a chair using your arms (e.g., wheelchair or bedside chair)?: A Little Help needed to walk in hospital room?: A Lot Help needed climbing 3-5 steps with a railing? : Total 6 Click Score: 17    End of Session Equipment Utilized During Treatment: Oxygen Activity Tolerance: Treatment limited secondary to medical complications (Comment) Patient left: in chair;with call bell/phone within reach Nurse Communication: Mobility status;Other (comment)(sats and incr WOB, HR with minimal activity) PT Visit Diagnosis: Muscle weakness (generalized) (M62.81);Other abnormalities of gait and mobility (R26.89)     Time: [id number] PT Time Calculation (min) (ACUTE ONLY): 44 min  Charges:  $Therapeutic Activity: 8-22 mins $Self Care/Home Management: 23-37                      Barry Brunner, PT Pager [phone]    Rexanne Mano 04/21/2019, 5:24 PM

## 2019-04-21 NOTE — TOC Progression Note (Addendum)
Transition of Care [facility]) - Progression Note    Patient Details  Name: [name] MRN: [id number] Date of Birth: 1956/02/08  Transition of Care [facility]) CM/SW Contact  Loletha Grayer Beverely Pace, RN Phone Number: 04/21/2019, 1:04 PM  Clinical Narrative:   Case manager continuing to monitor for needs. Patient remains on 15L HFNC. Therapy has reccommended CIR consult. Patient has hx of BKA.  (Wife remains in 68).  Patient's wife states that should patient have to go to SNF, she wants them to be at the same facility.         Expected Discharge Plan and Services                                                 Social Determinants of Health (SDOH) Interventions    Readmission Risk Interventions No flowsheet data found.

## 2019-04-21 NOTE — Progress Notes (Signed)
At start of shift, patient was on 6L on HFNC with O2 stats in the low 90's. RN increased oxygen supplementation to 8L on HFNC with slight improvement.   Throughout shift, RN noticed that the patient's O2 saturation would drop as low as 81% on 8L, with the highest of 91-92%. The patient stated that he feels his work of breathing increased with the HFNC. RN placed the patient on non-rebreather mask at 15L with O2 saturation sustaining at 99-100%. Patient also states he is more comfortable with mask. RN will continue to monitor.   Ermalinda Memos, RN

## 2019-04-22 LAB — CBC WITH DIFFERENTIAL/PLATELET
Abs Immature Granulocytes: 0.22 10*3/uL — ABNORMAL HIGH (ref 0.00–0.07)
Basophils Absolute: 0 10*3/uL (ref 0.0–0.1)
Basophils Relative: 0 %
Eosinophils Absolute: 0.2 10*3/uL (ref 0.0–0.5)
Eosinophils Relative: 1 %
HCT: 46.6 % (ref 39.0–52.0)
Hemoglobin: 15.6 g/dL (ref 13.0–17.0)
Immature Granulocytes: 1 %
Lymphocytes Relative: 9 %
Lymphs Abs: 1.5 10*3/uL (ref 0.7–4.0)
MCH: 31.8 pg (ref 26.0–34.0)
MCHC: 33.5 g/dL (ref 30.0–36.0)
MCV: 94.9 fL (ref 80.0–100.0)
Monocytes Absolute: 0.9 10*3/uL (ref 0.1–1.0)
Monocytes Relative: 6 %
Neutro Abs: 13.9 10*3/uL — ABNORMAL HIGH (ref 1.7–7.7)
Neutrophils Relative %: 83 %
Platelets: 54 10*3/uL — ABNORMAL LOW (ref 150–400)
RBC: 4.91 MIL/uL (ref 4.22–5.81)
RDW: 14.9 % (ref 11.5–15.5)
WBC: 16.9 10*3/uL — ABNORMAL HIGH (ref 4.0–10.5)
nRBC: 0.2 % (ref 0.0–0.2)

## 2019-04-22 LAB — COMPREHENSIVE METABOLIC PANEL
ALT: 39 U/L (ref 0–44)
AST: 37 U/L (ref 15–41)
Albumin: 3.1 g/dL — ABNORMAL LOW (ref 3.5–5.0)
Alkaline Phosphatase: 127 U/L — ABNORMAL HIGH (ref 38–126)
Anion gap: 9 (ref 5–15)
BUN: 19 mg/dL (ref 8–23)
CO2: 26 mmol/L (ref 22–32)
Calcium: 8.3 mg/dL — ABNORMAL LOW (ref 8.9–10.3)
Chloride: 102 mmol/L (ref 98–111)
Creatinine, Ser: 0.93 mg/dL (ref 0.61–1.24)
GFR calc Af Amer: >60 mL/min (ref >60)
GFR calc non Af Amer: >60 mL/min (ref >60)
Glucose, Bld: 108 mg/dL — ABNORMAL HIGH (ref 70–99)
Potassium: 4.7 mmol/L (ref 3.5–5.1)
Sodium: 137 mmol/L (ref 135–145)
Total Bilirubin: 1.3 mg/dL — ABNORMAL HIGH (ref 0.3–1.2)
Total Protein: 6.2 g/dL — ABNORMAL LOW (ref 6.5–8.1)

## 2019-04-22 LAB — C-REACTIVE PROTEIN: CRP: <0.8 mg/dL (ref <1.0)

## 2019-04-22 LAB — FERRITIN: Ferritin: 291 ng/mL (ref 24–336)

## 2019-04-22 LAB — GLUCOSE, CAPILLARY
Glucose-Capillary: 109 mg/dL — ABNORMAL HIGH (ref 70–99)
Glucose-Capillary: 252 mg/dL — ABNORMAL HIGH (ref 70–99)
Glucose-Capillary: 178 mg/dL — ABNORMAL HIGH (ref 70–99)
Glucose-Capillary: 156 mg/dL — ABNORMAL HIGH (ref 70–99)

## 2019-04-22 LAB — D-DIMER, QUANTITATIVE: D-Dimer, Quant: >20 ug/mL-FEU — ABNORMAL HIGH (ref 0.00–0.50)

## 2019-04-22 MED ORDER — FUROSEMIDE 10 MG/ML IJ SOLN
40.0000 mg | Freq: Once | INTRAMUSCULAR | Status: AC
  Administered 2019-04-22: 40 mg via INTRAVENOUS
  Filled 2019-04-22: qty 4

## 2019-04-22 NOTE — Progress Notes (Signed)
PROGRESS NOTE  [name]  [id number] DOB: 1956/01/09 DOA: 04/08/2019 PCP: [name], FNP  Brief Narrative: [name] is a 63 y.o. male with a history of HTN, HLD, and right BKA who presented 11/15 with fever and cough persisting despite antibiotics and prednisone as an outpatient. He was found to be hypoxic with left upper lobe infiltrate and SARS-CoV-2 PCR positive. Remdesivir and steroids were given. Due to persistent hypoxemia, convalescent plasma and tocilizumab were administered and CTA chest performed. This demonstrated segmental and subsegmental pulmonary emboli within the RLL. Therapeutic anticoagulation has been given, though the patient's hypoxemia continues with slow recovery. Lasix has been given intermittently and antibiotics were started when CXR demonstrated a possible bacterial coinfection 11/25.   Assessment & Plan: Principal Problem:   Pneumonia due to COVID-19 virus Active Problems:   Acute respiratory failure with hypoxia (HCC)   Essential hypertension   HLD (hyperlipidemia)   Community acquired pneumonia   Transaminitis   Hyperglycemia   AKI (acute kidney injury) ([place])   Bilateral pulmonary embolism (HCC)   Lobar pneumonia (HCC)  Acute hypoxic respiratory failure: Multifactorial, due to covid-19 pneumonia and acute PE as well as possible superimposed bacterial pneumonia.  - Hypoxemia improving slowly, but remains severe.  - CRP has normalized while d-dimer remains >20. We have switched to po prednisone on 11/27. Will stop this now that it's been given >2 weeks. Has received tocilizumab and convalescent plasma on 11/17.  - Trial lasix again today. - Continue ceftriaxone x7 days (11/25 - 12/1) - Continue therapeutic anticoagulation.  - Continue IS, FV, OOB, antitussives - Airborne/contact precautions. - CIR recommended disposition, will need negative testing.   Thrombocytopenia: Reports he may have had this episodically in the past but denies any  liver dysfunction/hematologic consultations or personal or family history of this. At intermediate risk of HITT (4T's = 5).  - Stopped heparin and started xarelto which we will continue. Platelet count is stable.  - Will discuss any further recommendations with hematology - continue monitoring platelets. Having some blood tinged sputum but not worsening nor significant.  T2DM with steroid-induced hyperglycemia: HbA1c 7.1%.  - Continue SSI  HLD:  - Continue statin  BPH:  - Continue flomax  Obesity: BMI 37. Noted.  Right hand fine motor deficit: Appears stable. CT head without acute infarct noted.  - Monitor, consider repeat (delayed) CT if symptoms persistent  AKI on stage I CKD: Resolved AKI.  - Control risk factors.  DVT prophylaxis: Xarelto Code Status: Full Family Communication: None at bedside, declined offer to call. Wife is also admitted and has visited. Disposition Plan: Uncertain, will consider CIR once clinically stabilized enough for disposition.  Consultants:   None  Procedures:   None  Antimicrobials:  CTX  Remdesivir   Subjective: Feels breathing is slightly worse today, very winded for many minutes after transfer/pivot just to chair. He is using IS, FV consistently. Dyspnea on exertion is moderate-severe, constant, and improved with rest/pursed lip breathing.   Objective: Vitals:   04/21/19 1924 04/22/19 0800 04/22/19 1146 04/22/19 1205  BP: 124/72 119/71 116/79   Pulse: 90 81 (!) 113 (!) 109  Resp:  20 19 19   Temp: 98.2 F (36.8 C) 97.9 F (36.6 C) 97.7 F (36.5 C)   TempSrc: Oral Oral Oral   SpO2: 95% 96% 100% 100%  Weight:      Height:        Intake/Output Summary (Last 24 hours) at 04/22/2019 1719 Last data filed at 04/22/2019  1049 Gross per 24 hour  Intake 240 ml  Output 425 ml  Net -185 ml   Filed Weights   04/09/19 0300  Weight: 104.3 kg    Gen: 63 y.o. male in no distress Pulm: Non-labored breathing at rest on supplemental  HFNC, becomes tachypneic and mildly labored after speaking for a time. Crackles bilaterally CV: Regular rate and rhythm. No murmur, rub, or gallop. No JVD, trace nonpitting LLE edema GI: Abdomen soft, non-tender, non-distended, with normoactive bowel sounds. No organomegaly or masses felt. Ext: Warm, right BKA stump normal. Skin: No rashes, lesions or ulcers Neuro: Alert and oriented. No focal neurological deficits. Psych: Judgement and insight appear normal. Mood & affect appropriate.   Data Reviewed: I have personally reviewed following labs and imaging studies  CBC: Recent Labs  Lab 04/19/19 0430 04/20/19 0555 04/21/19 0535 04/22/19 0022  WBC 22.3* 21.6* 21.4* 16.9*  NEUTROABS  --   --  17.8* 13.9*  HGB 15.7 16.2 16.6 15.6  HCT 47.2 48.6 49.6 46.6  MCV 95.5 94.9 94.7 94.9  PLT 62* 56* 53* 54*   Basic Metabolic Panel: Recent Labs  Lab 04/18/19 0629 04/19/19 0600 04/20/19 0555 04/21/19 0535 04/22/19 0022  NA 139 137 138 137 137  K 4.5 4.5 4.9 4.9 4.7  CL 102 100 101 100 102  CO2 23 25 25 24 26   GLUCOSE 119* 116* 98 101* 108*  BUN 21 22 20 19 19   CREATININE 1.01 1.02 0.94 1.08 0.93  CALCIUM 8.6* 8.6* 8.6* 8.6* 8.3*   GFR: Estimated Creatinine Clearance: 92 mL/min (by C-G formula based on SCr of 0.93 mg/dL). Liver Function Tests: Recent Labs  Lab 04/17/19 0411 04/18/19 0629 04/19/19 0600 04/20/19 0555 04/22/19 0022  AST 52* 61* 53* 49* 37  ALT 39 42 39 40 39  ALKPHOS 239* 273* 238* 197* 127*  BILITOT 1.5* 1.5* 1.1 1.3* 1.3*  PROT 6.3* 7.0 6.7 6.7 6.2*  ALBUMIN 3.3* 3.5 3.5 3.4* 3.1*   No results for input(s): LIPASE, AMYLASE in the last 168 hours. No results for input(s): AMMONIA in the last 168 hours. Coagulation Profile: No results for input(s): INR, PROTIME in the last 168 hours. Cardiac Enzymes: No results for input(s): CKTOTAL, CKMB, CKMBINDEX, TROPONINI in the last 168 hours. BNP (last 3 results) No results for input(s): PROBNP in the last 8760  hours. HbA1C: No results for input(s): HGBA1C in the last 72 hours. CBG: Recent Labs  Lab 04/21/19 1758 04/21/19 2109 04/22/19 0804 04/22/19 1144 04/22/19 1629  GLUCAP 116* 122* 109* 252* 178*   Lipid Profile: No results for input(s): CHOL, HDL, LDLCALC, TRIG, CHOLHDL, LDLDIRECT in the last 72 hours. Thyroid Function Tests: No results for input(s): TSH, T4TOTAL, FREET4, T3FREE, THYROIDAB in the last 72 hours. Anemia Panel: Recent Labs    04/20/19 0555 04/22/19 0022  FERRITIN 219 291   Urine analysis:    Component Value Date/Time   BILIRUBINUR negative 12/28/2018 1717   PROTEINUR Negative 12/28/2018 1717   UROBILINOGEN 0.2 12/28/2018 1717   NITRITE negative 12/28/2018 1717   LEUKOCYTESUR Negative 12/28/2018 1717   No results found for this or any previous visit (from the past 240 hour(s)).    Radiology Studies: No results found.  Scheduled Meds: . aspirin  81 mg Oral Daily  . atorvastatin  40 mg Oral q1800  . chlorpheniramine-HYDROcodone  5 mL Oral Q12H  . famotidine  20 mg Oral BID  . fluticasone  2 spray Each Nare BID  . insulin aspart  0-9 Units Subcutaneous TID WC  . Ipratropium-Albuterol  1 puff Inhalation Q6H  . predniSONE  20 mg Oral Q breakfast  . Rivaroxaban  15 mg Oral BID WC  . [START ON 05/03/2019] rivaroxaban  20 mg Oral Q supper  . tamsulosin  0.4 mg Oral QPC supper  . ursodiol  300 mg Oral BID  . vitamin C  500 mg Oral Daily  . zinc sulfate  220 mg Oral Daily   Continuous Infusions: . cefTRIAXone (ROCEPHIN)  IV 2 g (04/22/19 1041)     LOS: 13 days   Time spent: 25 minutes.  Patrecia Pour, MD [facility] [email or web address] 04/22/2019, 5:19 PM

## 2019-04-22 NOTE — Progress Notes (Signed)
Occupational Therapy Treatment Patient Details Name: [name] MRN: [id number] DOB: 1955-11-23 Today's Date: 04/22/2019    History of present illness 63 y/o male w/ hx liver disorder, HTN, HLD, R BKA, GERD presented to ED with SOB, cough, fever and chills, admitted with dx acute hypoxic resp failure sec to SARS COVID 19.His follow-up CT chest showed segmental and subsegmental pulmonary embolism within branches of the right lower lobe.   OT comments  Pt continues to demonstrate improvement in activity tolerance with decreased oxygen dependence. Pt unable to recall previously learned fall prevention and energy conservation techniques with continued education provided. Continued education with pt on importance of self-proning as well as completing breathing exercises with good understanding. Pt completed standing tolerance task in preparation for ADLs. Pt tolerated standing 1 x 2 min and 1 x 1.5 min with supervision. O2 increased to 15L HFNC during activity with O2 SATs maintaining in 90s. Pt demo good recall and implementation of pursed lip breathing strategies. 2/4 DOE. Pt required mod rest breaks throughout due to SOB. HR increased into 120s during activity with RR in the 30s. Pt back to 7L HFNC with O2 SATs at 100% at end of session.   Follow Up Recommendations  CIR    Equipment Recommendations  Other (comment)(May need RW)    Recommendations for Other Services      Precautions / Restrictions Precautions Precautions: Fall Precaution Comments: R BKA Required Braces or Orthoses: Other Brace(right prosthetic leg) Restrictions Weight Bearing Restrictions: No       Mobility Bed Mobility                  Transfers Overall transfer level: Needs assistance Equipment used: Rolling walker (2 wheeled) Transfers: Sit to/from Stand Sit to Stand: Supervision         General transfer comment: Stand 1 x 2 min and 1 x 1.5 min with RW and supervision. Noted 0 instances of  LOB.    Balance Overall balance assessment: Needs assistance   Sitting balance-Leahy Scale: Good       Standing balance-Leahy Scale: Poor                             ADL either performed or assessed with clinical judgement   ADL                       Lower Body Dressing: Supervision/safety;Sitting/lateral leans;Sit to/from stand                       Vision       Perception     Praxis      Cognition Arousal/Alertness: Awake/alert Behavior During Therapy: WFL for tasks assessed/performed Overall Cognitive Status: Within Functional Limits for tasks assessed                       Memory: Decreased recall of precautions Following Commands: Follows multi-step commands consistently       General Comments: Pt unable to recall what was learned from pervious session.        Exercises Exercises: Other exercises Other Exercises Other Exercises: Educated pt on importance of using incentive spirometer and flutter valve. Pt able to recall number of times per hour. Other Exercises: Pt demo good recall and incorporation of pursed lip breathing into therapy tasks this date.   Shoulder Instructions       General  Comments Pt weaned down to 7L HFNC at rest, however during activity required 15L HFNC. Continued education with pt on safety strategies, activity modifications, and energy conservation techniques for self-care and functional transfer tasks.    Pertinent Vitals/ Pain       Pain Assessment: No/denies pain  Home Living                                          Prior Functioning/Environment              Frequency  Min 3X/week        Progress Toward Goals  OT Goals(current goals can now be found in the care plan section)  Progress towards OT goals: Progressing toward goals  Acute Rehab OT Goals Patient Stated Goal: get back to being able to walk and return to work ADL Goals Pt Will Perform Lower  Body Bathing: with modified independence;sit to/from stand Pt Will Perform Lower Body Dressing: with modified independence;sit to/from stand Pt Will Perform Toileting - Clothing Manipulation and hygiene: with modified independence Pt Will Perform Tub/Shower Transfer: with modified independence;ambulating;Shower transfer;3 in 1 Pt/caregiver will Perform Home Exercise Program: Independently;With theraband;Increased strength;Both right and left upper extremity;With written HEP provided Additional ADL Goal #1: Pt will independently verbalize 3 strategies to reduce risk of falls  Plan Discharge plan needs to be updated    Co-evaluation                 AM-PAC OT "6 Clicks" Daily Activity     Outcome Measure   Help from another person eating meals?: None Help from another person taking care of personal grooming?: A Little Help from another person toileting, which includes using toliet, bedpan, or urinal?: A Little Help from another person bathing (including washing, rinsing, drying)?: A Little Help from another person to put on and taking off regular upper body clothing?: A Little Help from another person to put on and taking off regular lower body clothing?: A Little 6 Click Score: 19    End of Session Equipment Utilized During Treatment: Oxygen  OT Visit Diagnosis: Unsteadiness on feet (R26.81);Muscle weakness (generalized) (M62.81)   Activity Tolerance Patient limited by fatigue(Limited by SOB)   Patient Left in chair;with call bell/phone within reach   Nurse Communication Mobility status        Time: [id number] OT Time Calculation (min): 26 min  Charges: OT General Charges $OT Visit: 1 Visit OT Treatments $Self Care/Home Management : 8-22 mins $Therapeutic Activity: 8-22 mins  Mauri Brooklyn OTR/L [phone]    Mauri Brooklyn 04/22/2019, 2:37 PM

## 2019-04-23 LAB — CBC WITH DIFFERENTIAL/PLATELET
Abs Immature Granulocytes: 0.12 10*3/uL — ABNORMAL HIGH (ref 0.00–0.07)
Basophils Absolute: 0.1 10*3/uL (ref 0.0–0.1)
Basophils Relative: 1 %
Eosinophils Absolute: 0.4 10*3/uL (ref 0.0–0.5)
Eosinophils Relative: 3 %
HCT: 49.7 % (ref 39.0–52.0)
Hemoglobin: 16.1 g/dL (ref 13.0–17.0)
Immature Granulocytes: 1 %
Lymphocytes Relative: 11 %
Lymphs Abs: 1.4 10*3/uL (ref 0.7–4.0)
MCH: 31.6 pg (ref 26.0–34.0)
MCHC: 32.4 g/dL (ref 30.0–36.0)
MCV: 97.6 fL (ref 80.0–100.0)
Monocytes Absolute: 0.9 10*3/uL (ref 0.1–1.0)
Monocytes Relative: 6 %
Neutro Abs: 10.9 10*3/uL — ABNORMAL HIGH (ref 1.7–7.7)
Neutrophils Relative %: 78 %
Platelets: 53 10*3/uL — ABNORMAL LOW (ref 150–400)
RBC: 5.09 MIL/uL (ref 4.22–5.81)
RDW: 15.2 % (ref 11.5–15.5)
WBC: 13.7 10*3/uL — ABNORMAL HIGH (ref 4.0–10.5)
nRBC: 0.1 % (ref 0.0–0.2)

## 2019-04-23 LAB — COMPREHENSIVE METABOLIC PANEL
ALT: 46 U/L — ABNORMAL HIGH (ref 0–44)
AST: 37 U/L (ref 15–41)
Albumin: 3.6 g/dL (ref 3.5–5.0)
Alkaline Phosphatase: 107 U/L (ref 38–126)
Anion gap: 11 (ref 5–15)
BUN: 22 mg/dL (ref 8–23)
CO2: 28 mmol/L (ref 22–32)
Calcium: 8.4 mg/dL — ABNORMAL LOW (ref 8.9–10.3)
Chloride: 99 mmol/L (ref 98–111)
Creatinine, Ser: 0.86 mg/dL (ref 0.61–1.24)
GFR calc Af Amer: >60 mL/min (ref >60)
GFR calc non Af Amer: >60 mL/min (ref >60)
Glucose, Bld: 110 mg/dL — ABNORMAL HIGH (ref 70–99)
Potassium: 4 mmol/L (ref 3.5–5.1)
Sodium: 138 mmol/L (ref 135–145)
Total Bilirubin: 1.6 mg/dL — ABNORMAL HIGH (ref 0.3–1.2)
Total Protein: 6.5 g/dL (ref 6.5–8.1)

## 2019-04-23 LAB — GLUCOSE, CAPILLARY
Glucose-Capillary: 125 mg/dL — ABNORMAL HIGH (ref 70–99)
Glucose-Capillary: 151 mg/dL — ABNORMAL HIGH (ref 70–99)
Glucose-Capillary: 151 mg/dL — ABNORMAL HIGH (ref 70–99)
Glucose-Capillary: 140 mg/dL — ABNORMAL HIGH (ref 70–99)
Glucose-Capillary: 137 mg/dL — ABNORMAL HIGH (ref 70–99)

## 2019-04-23 NOTE — Progress Notes (Signed)
Rehab Admissions Coordinator Note:  Per therapy recommendations, patient was screened by Michel Santee for appropriateness for an Inpatient Acute Rehab Consult.  At this time, pt appears to be too high level, functionally, for CIR stay and with current O2 requirements does not appear to be able to tolerate 3 hrs of therapy today.  Would recommend f/u at a lower level of care.   Michel Santee 04/23/2019, 9:27 AM  I can be reached at [phone].

## 2019-04-23 NOTE — Progress Notes (Signed)
PROGRESS NOTE  [name]  [id number] DOB: 06-Dec-1955 DOA: 04/08/2019 PCP: [name], FNP  Brief Narrative: [name] is a 63 y.o. male with a history of HTN, HLD, and right BKA who presented 11/15 with fever and cough persisting despite antibiotics and prednisone as an outpatient. He was found to be hypoxic with left upper lobe infiltrate and SARS-CoV-2 PCR positive. Remdesivir and steroids were given. Due to persistent hypoxemia, convalescent plasma and tocilizumab were administered and CTA chest performed. This demonstrated segmental and subsegmental pulmonary emboli within the RLL. Therapeutic anticoagulation has been given, though the patient's hypoxemia continues with slow recovery. Lasix has been given intermittently and antibiotics were started when CXR demonstrated a possible bacterial coinfection 11/25.   Assessment & Plan: Principal Problem:   Pneumonia due to COVID-19 virus Active Problems:   Acute respiratory failure with hypoxia (HCC)   Essential hypertension   HLD (hyperlipidemia)   Community acquired pneumonia   Transaminitis   Hyperglycemia   AKI (acute kidney injury) ([facility])   Bilateral pulmonary embolism (HCC)   Lobar pneumonia (HCC)  Acute hypoxic respiratory failure: Multifactorial, due to covid-19 pneumonia and acute PE as well as possible superimposed bacterial pneumonia.  - Hypoxemia improving slowly, but remains severe.  - CRP has normalized while d-dimer remains >20. We have switched to po prednisone on 11/27. Will stop this now that it's been given >2 weeks. Has received tocilizumab and convalescent plasma on 11/17.  - Trial lasix again today. - Continue ceftriaxone x7 days (11/25 - 12/1) - Continue therapeutic anticoagulation.  - Continue IS, FV, OOB, antitussives - Airborne/contact precautions. - CIR recommended disposition, will need negative testing.   Thrombocytopenia: Reports he may have had this episodically in the past but denies any  liver dysfunction/hematologic consultations or personal or family history of this. At intermediate risk of HITT (4T's = 5).  - Stopped heparin and started xarelto which we will continue. Platelet count is stable.  - Heparin-induced Ab tested, pending. Will discuss any further recommendations with hematology. - Continue monitoring platelets.    T2DM with steroid-induced hyperglycemia: HbA1c 7.1%.  - Continue SSI  HLD:  - Continue statin  BPH:  - Continue flomax  Obesity: BMI 37. Noted.  Right hand apraxia: Appears stable. CT head without acute infarct noted initially. Not consistent with seizure. ?Critical illness neuropathy.  - Definite deficit noted. Would recheck CT once patient amenable, currently stating he's unsure he'd tolerate laying flat that long.  - OT reconsulted to concentrate on this.  - If treating as a stroke, he would get statin (which he's on) and antiplatelet. 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  - Control risk factors.  DVT prophylaxis: Xarelto Code Status: Full Family Communication: Spoke with wife, who is also admitted, in person. Disposition Plan: Uncertain, will consider CIR once clinically stabilized enough for disposition.  Consultants:   None  Procedures:   None  Antimicrobials:  CTX  Remdesivir   Subjective: Right hand remains with very poor coordination, this is his dominant hand, this began several days ago. CT at that time was negative. Shortness of breath has subjectively improved, though remains severe and limits exertion considerably. No chest pain.   Objective: Vitals:   04/22/19 1938 04/23/19 0348 04/23/19 0800 04/23/19 1217  BP: 124/72 118/76 117/73 123/84  Pulse: (!) 101 98 76 82  Resp: 19  (!) 22 (!) 21  Temp:  98.5 F (36.9 C) 98.2 F (36.8 C) 97.9 F (36.6 C) 98.6 F (37 C)  TempSrc: Oral Axillary Oral Axillary  SpO2: 100% 97%  100% 100%  Weight:      Height:        Intake/Output Summary (Last 24 hours) at 04/23/2019 1623 Last data filed at 04/23/2019 1023 Gross per 24 hour  Intake 480 ml  Output -  Net 480 ml   Filed Weights   04/09/19 0300  Weight: 104.3 kg   Gen: 63 y.o. male in no distress Pulm: Nonlabored breathing 15LPM. Crackles bilaterally. CV: Regular rate and rhythm. No murmur, rub, or gallop. No JVD, no significant dependent edema. GI: Abdomen soft, non-tender, non-distended, with normoactive bowel sounds.  Ext: Warm, right BKA stable Skin: No rashes, lesions or ulcers on visualized skin. Neuro: Alert and oriented. Right hand with mild dysmetria, no dysdiadochokinesia. +apraxia, no sensory or gross motor deficits. Psych: Judgement and insight appear fair. Mood euthymic & affect congruent. Behavior is appropriate.    Data Reviewed: I have personally reviewed following labs and imaging studies  CBC: Recent Labs  Lab 04/19/19 0430 04/20/19 0555 04/21/19 0535 04/22/19 0022 04/23/19 0800  WBC 22.3* 21.6* 21.4* 16.9* 13.7*  NEUTROABS  --   --  17.8* 13.9* 10.9*  HGB 15.7 16.2 16.6 15.6 16.1  HCT 47.2 48.6 49.6 46.6 49.7  MCV 95.5 94.9 94.7 94.9 97.6  PLT 62* 56* 53* 54* 53*   Basic Metabolic Panel: Recent Labs  Lab 04/19/19 0600 04/20/19 0555 04/21/19 0535 04/22/19 0022 04/23/19 0303  NA 137 138 137 137 138  K 4.5 4.9 4.9 4.7 4.0  CL 100 101 100 102 99  CO2 25 25 24 26 28   GLUCOSE 116* 98 101* 108* 110*  BUN 22 20 19 19 22   CREATININE 1.02 0.94 1.08 0.93 0.86  CALCIUM 8.6* 8.6* 8.6* 8.3* 8.4*   Liver Function Tests: Recent Labs  Lab 04/18/19 0629 04/19/19 0600 04/20/19 0555 04/22/19 0022 04/23/19 0303  AST 61* 53* 49* 37 37  ALT 42 39 40 39 46*  ALKPHOS 273* 238* 197* 127* 107  BILITOT 1.5* 1.1 1.3* 1.3* 1.6*  PROT 7.0 6.7 6.7 6.2* 6.5  ALBUMIN 3.5 3.5 3.4* 3.1* 3.6   CBG: Recent Labs  Lab 04/22/19 1144 04/22/19 1629 04/22/19 2028 04/23/19 0838 04/23/19  1213  GLUCAP 252* 178* 156* 151* 151*   Anemia Panel: Recent Labs    04/22/19 0022  FERRITIN 291   Urine analysis:    Component Value Date/Time   BILIRUBINUR negative 12/28/2018 1717   PROTEINUR Negative 12/28/2018 1717   UROBILINOGEN 0.2 12/28/2018 1717   NITRITE negative 12/28/2018 1717   LEUKOCYTESUR Negative 12/28/2018 1717   Scheduled Meds: . aspirin  81 mg Oral Daily  . atorvastatin  40 mg Oral q1800  . chlorpheniramine-HYDROcodone  5 mL Oral Q12H  . famotidine  20 mg Oral BID  . fluticasone  2 spray Each Nare BID  . insulin aspart  0-9 Units Subcutaneous TID WC  . Ipratropium-Albuterol  1 puff Inhalation Q6H  . Rivaroxaban  15 mg Oral BID WC  . [START ON 05/03/2019] rivaroxaban  20 mg Oral Q supper  . tamsulosin  0.4 mg Oral QPC supper  . ursodiol  300 mg Oral BID  . vitamin C  500 mg Oral Daily  . zinc sulfate  220 mg Oral Daily   Continuous Infusions: . cefTRIAXone (ROCEPHIN)  IV 2 g (04/23/19 1145)     LOS: 14  days   Time spent: 25 minutes.  Patrecia Pour, MD [facility] [email or web address] 04/23/2019, 4:23 PM

## 2019-04-23 NOTE — Progress Notes (Signed)
Physical Therapy Treatment Patient Details Name: [name] MRN: [id number] DOB: February 07, 1956 Today's Date: 04/23/2019    History of Present Illness 63 y/o male w/ hx liver disorder, HTN, HLD, R BKA, GERD presented to ED with SOB, cough, fever and chills, admitted with dx acute hypoxic resp failure sec to SARS COVID 19.His follow-up CT chest showed segmental and subsegmental pulmonary embolism within branches of the right lower lobe. Patient noted to have rt hand weakness and incoordination with head CT 04/17/19 showing no acute changes. 04/23/19 pt reports no improvement and no worsening of rt hand.     PT Comments    Patient tolerated activity better today--only requiring increase to 8L O2 for activity--and able to stand x 2 minutes and then walk 20 feet with RW, RLE prosthesis and chair following. Sats remained >88%, HR up to 120, RR 33. Patient attempted standing rest/recovery x 90 seconds, however could not decrease his HR or WOB and returned to sitting. Once seated, he recovered to sats >90% in less than one minute; HR slowly decreasing to 102. Patient is highly motivated and responds well to cues/instructions. He has faithfully been doing his IS and flutter valve as instructed. Noted CIR admissions coordinator is concerned with his ability to tolerate intensity of CIR. Hopeful that patient will continue to reduce his O2 needs and improve his exercise tolerance to allow him to return to his highly independent lifestyle and job.    Follow Up Recommendations  CIR(asking rehab coordinator to reconsider)     Equipment Recommendations  Rolling walker with 5" wheels    Recommendations for Other Services       Precautions / Restrictions Precautions Precautions: Fall Precaution Comments: R BKA Required Braces or Orthoses: Other Brace(right prosthetic leg) Restrictions Weight Bearing Restrictions: No    Mobility  Bed Mobility                  Transfers Overall transfer  level: Needs assistance Equipment used: Rolling walker (2 wheeled) Transfers: Sit to/from Stand Sit to Stand: Supervision         General transfer comment: Stood for 2 minutes prior to ambulating; working on PLB  Ambulation/Gait Ambulation/Gait assistance: Equities trader (Feet): 20 Feet Assistive device: Rolling walker (2 wheeled) Gait Pattern/deviations: Decreased stride length     General Gait Details: pt with incr WOB and HR; attempted standing recovery x 90 seconds and could not recover until he sat down    Stairs             Wheelchair Mobility    Modified Rankin (Stroke Patients Only)       Balance Overall balance assessment: Needs assistance Sitting-balance support: Feet supported;No upper extremity supported Sitting balance-Leahy Scale: Good Sitting balance - Comments: edge of chair Postural control: (good) Standing balance support: During functional activity Standing balance-Leahy Scale: Poor Standing balance comment: light use of bil UEs on RW                            Cognition Arousal/Alertness: Awake/alert Behavior During Therapy: WFL for tasks assessed/performed Overall Cognitive Status: Within Functional Limits for tasks assessed                                        Exercises Other Exercises Other Exercises: Pt demo good recall and incorporation of pursed lip  breathing into therapy tasks this date.    General Comments   Able to assist far more with donning his RLE prosthesis--after assisted to don liners/socks (which required incr flexion and incr WOB), he was able to place leg down into socket and pull up his suspension sleeve himself.       Pertinent Vitals/Pain Pain Assessment: No/denies pain    Home Living Family/patient expects to be discharged to:: Private residence Living Arrangements: Spouse/significant other Available Help at Discharge: Family Type of Home:  House Home Access: Stairs to enter Entrance Stairs-Rails: Left Home Layout: One level Home Equipment: Shower seat - built in;Grab bars - tub/shower      Prior Function Level of Independence: Independent;Independent with assistive device(s)      Comments: uses a prosthetic; has used prosthetic since birth   PT Goals (current goals can now be found in the care plan section) Acute Rehab PT Goals Patient Stated Goal: get back to being able to walk and return to work Progress towards PT goals: Progressing toward goals    Frequency    Min 3X/week      PT Plan Current plan remains appropriate(will ask Admissions coordinator to reconsider)    Co-evaluation              AM-PAC PT "6 Clicks" Mobility   Outcome Measure  Help needed turning from your back to your side while in a flat bed without using bedrails?: None Help needed moving from lying on your back to sitting on the side of a flat bed without using bedrails?: None Help needed moving to and from a bed to a chair (including a wheelchair)?: A Little Help needed standing up from a chair using your arms (e.g., wheelchair or bedside chair)?: A Little Help needed to walk in hospital room?: A Lot Help needed climbing 3-5 steps with a railing? : Total 6 Click Score: 17    End of Session Equipment Utilized During Treatment: Oxygen Activity Tolerance: Treatment limited secondary to medical complications (Comment) Patient left: in chair;with call bell/phone within reach Nurse Communication: Mobility status;Other (comment)(sats and incr WOB, back on 8L) PT Visit Diagnosis: Muscle weakness (generalized) (M62.81);Other abnormalities of gait and mobility (R26.89)     Time: [id number] PT Time Calculation (min) (ACUTE ONLY): 39 min  Charges:  $Gait Training: 8-22 mins $Self Care/Home Management: 23-37                      Barry Brunner, PT Pager [phone]    Rexanne Mano 04/23/2019, 6:14 PM

## 2019-04-24 LAB — COMPREHENSIVE METABOLIC PANEL
ALT: 50 U/L — ABNORMAL HIGH (ref 0–44)
AST: 39 U/L (ref 15–41)
Albumin: 3.4 g/dL — ABNORMAL LOW (ref 3.5–5.0)
Alkaline Phosphatase: 83 U/L (ref 38–126)
Anion gap: 8 (ref 5–15)
BUN: 17 mg/dL (ref 8–23)
CO2: 31 mmol/L (ref 22–32)
Calcium: 8.2 mg/dL — ABNORMAL LOW (ref 8.9–10.3)
Chloride: 99 mmol/L (ref 98–111)
Creatinine, Ser: 0.96 mg/dL (ref 0.61–1.24)
GFR calc Af Amer: >60 mL/min (ref >60)
GFR calc non Af Amer: >60 mL/min (ref >60)
Glucose, Bld: 113 mg/dL — ABNORMAL HIGH (ref 70–99)
Potassium: 4.3 mmol/L (ref 3.5–5.1)
Sodium: 138 mmol/L (ref 135–145)
Total Bilirubin: 1.8 mg/dL — ABNORMAL HIGH (ref 0.3–1.2)
Total Protein: 6.4 g/dL — ABNORMAL LOW (ref 6.5–8.1)

## 2019-04-24 LAB — CBC WITH DIFFERENTIAL/PLATELET
Abs Immature Granulocytes: 0.05 10*3/uL (ref 0.00–0.07)
Basophils Absolute: 0.1 10*3/uL (ref 0.0–0.1)
Basophils Relative: 1 %
Eosinophils Absolute: 0.5 10*3/uL (ref 0.0–0.5)
Eosinophils Relative: 6 %
HCT: 49.1 % (ref 39.0–52.0)
Hemoglobin: 15.9 g/dL (ref 13.0–17.0)
Immature Granulocytes: 1 %
Lymphocytes Relative: 11 %
Lymphs Abs: 1 10*3/uL (ref 0.7–4.0)
MCH: 31.2 pg (ref 26.0–34.0)
MCHC: 32.4 g/dL (ref 30.0–36.0)
MCV: 96.3 fL (ref 80.0–100.0)
Monocytes Absolute: 0.6 10*3/uL (ref 0.1–1.0)
Monocytes Relative: 7 %
Neutro Abs: 6.4 10*3/uL (ref 1.7–7.7)
Neutrophils Relative %: 74 %
Platelets: 56 10*3/uL — ABNORMAL LOW (ref 150–400)
RBC: 5.1 MIL/uL (ref 4.22–5.81)
RDW: 15.1 % (ref 11.5–15.5)
WBC: 8.6 10*3/uL (ref 4.0–10.5)
nRBC: 0 % (ref 0.0–0.2)

## 2019-04-24 LAB — GLUCOSE, CAPILLARY
Glucose-Capillary: 118 mg/dL — ABNORMAL HIGH (ref 70–99)
Glucose-Capillary: 141 mg/dL — ABNORMAL HIGH (ref 70–99)
Glucose-Capillary: 170 mg/dL — ABNORMAL HIGH (ref 70–99)
Glucose-Capillary: 160 mg/dL — ABNORMAL HIGH (ref 70–99)

## 2019-04-24 LAB — HEPARIN INDUCED PLATELET AB (HIT ANTIBODY): Heparin Induced Plt Ab: 0.074 OD (ref 0.000–0.400)

## 2019-04-24 NOTE — Progress Notes (Addendum)
PROGRESS NOTE  [name]  [id number] DOB: 03-17-1956 DOA: 04/08/2019 PCP: [name], FNP  Brief Narrative: [name] is a 63 y.o. male with a history of HTN, HLD, and right BKA who presented 11/15 with fever and cough persisting despite antibiotics and prednisone as an outpatient. He was found to be hypoxic with left upper lobe infiltrate and SARS-CoV-2 PCR positive. Remdesivir and steroids were given. Due to persistent hypoxemia, convalescent plasma and tocilizumab were administered and CTA chest performed. This demonstrated segmental and subsegmental pulmonary emboli within the RLL. Therapeutic anticoagulation has been given, though the patient's hypoxemia continues with slow recovery. Lasix has been given intermittently and antibiotics were started when CXR demonstrated a possible bacterial coinfection 11/25.   Assessment & Plan: Principal Problem:   Pneumonia due to COVID-19 virus Active Problems:   Acute respiratory failure with hypoxia (HCC)   Essential hypertension   HLD (hyperlipidemia)   Community acquired pneumonia   Transaminitis   Hyperglycemia   AKI (acute kidney injury) ([place])   Bilateral pulmonary embolism (HCC)   Lobar pneumonia (HCC)  Acute hypoxic respiratory failure: Multifactorial, due to covid-19 pneumonia and acute PE as well as possible superimposed bacterial pneumonia.  - Hypoxemia improving slowly, but remains severe.  - CRP has normalized while d-dimer remains >20. We have switched to po prednisone on 11/27. Will stop this now that it's been given >2 weeks. Has received tocilizumab and convalescent plasma on 11/17.  - Trial lasix again today. UOP not strictly charted. - Continue ceftriaxone x7 days (11/25 - 12/1) complete today. - Continue therapeutic anticoagulation.  - Continue IS, FV, OOB, antitussives - Airborne/contact precautions. - CIR recommended disposition, will need negative testing.   Thrombocytopenia: Reports he may have had  this episodically in the past but denies any liver dysfunction/hematologic consultations or personal or family history of this. At intermediate risk of HITT (4T's = 5). Would have expected improvement if this was HIT, though recovery can be delayed due to covid. Also a concern would be alloimmunity with convalescent plasma though this was given 11/17 and platelets noted to be normal 11/19. No evidence of hemolysis.  - Stopped heparin and started xarelto at the time when platelets were still wnl. Platelet count once found to have dropped has remained very stable just above 50k. No major bleeding noted and hgb is wnl/stable.  - Discussed the case with Dr. Irene Limbo, hematology, who recommended checking SRA and immature platelet fraction in addition to HIT Ab which remains pending. which we will continue. Platelet count is stable.  - Continue monitoring platelets. If bleeding develops or platelet count dips near to 30k, would suggest platelet transfusion. If refractory bleeding/worsened thrombocytopenia, would have to consider decreasing dose of xarelto vs. argatroban.   T2DM with steroid-induced hyperglycemia: HbA1c 7.1%.  - Continue SSI, improving as steroids have been tapered  HLD:  - Continue statin  BPH:  - Continue flomax  Obesity: BMI 37. Noted.  Right hand apraxia and edema: Appears stable. CT head without acute infarct noted initially. Not consistent with seizure. ?Critical illness neuropathy.  - Elevated RUE - Definite deficit noted. Would recheck CT once patient amenable, currently stating he's unsure he'd tolerate laying flat that long.  - OT reconsulted to concentrate on this.  - If treating as a stroke, he would get statin (which he's on) and antiplatelet. 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  -  Control risk factors.  DVT prophylaxis: Xarelto Code Status: Full Family Communication: Spoke  with wife, who is also admitted, in person. Disposition Plan: Uncertain, will consider CIR once clinically stabilized enough for disposition. Note previously declined by admissions coordinator. He should not be evaluated for venue of disposition until he is closer to stability enough to discharge.  Consultants:   None  Procedures:   None  Antimicrobials:  CTX  Remdesivir   Subjective: Shortness of breath getting marginally better, mostly that time to recover is decreasing after exertion and exertional ability is increasing slowly. No chest pain. Still some specks of blood in sputum but not much. Using IS and FV often. Right hand clumsiness/weakness is stable.   Objective: Vitals:   04/24/19 0026 04/24/19 0423 04/24/19 0740 04/24/19 1205  BP: 126/80 115/73 119/76   Pulse: 88 96 91   Resp: 20 18 18 18   Temp: 97.9 F (36.6 C) 97.8 F (36.6 C) 98.1 F (36.7 C) 98.2 F (36.8 C)  TempSrc: Oral Oral Oral Oral  SpO2: 99% 100%    Weight:      Height:        Intake/Output Summary (Last 24 hours) at 04/24/2019 1214 Last data filed at 04/23/2019 2100 Gross per 24 hour  Intake -  Output 350 ml  Net -350 ml   Filed Weights   04/09/19 0300  Weight: 104.3 kg   Gen: 63 y.o. male in no distress Pulm: Nonlabored breathing 12LPM, crackles bilaterally without wheezes, diminished. CV: Regular rate and rhythm. No murmur, rub, or gallop. No JVD, no significant dependent edema. GI: Abdomen soft, non-tender, non-distended, with normoactive bowel sounds.  Ext: Warm, no deformities, right hand slightly edematous. Skin: No new rashes, lesions or ulcers on visualized skin. Neuro: Alert and oriented. Has continued right hand apraxia. No new focal neurological deficits. Psych: Judgement and insight appear fair. Mood euthymic & affect congruent. Behavior is appropriate.   Data Reviewed: I have personally reviewed following labs and imaging studies  CBC: Recent Labs  Lab 04/19/19 0430  04/20/19 0555 04/21/19 0535 04/22/19 0022 04/23/19 0800  WBC 22.3* 21.6* 21.4* 16.9* 13.7*  NEUTROABS  --   --  17.8* 13.9* 10.9*  HGB 15.7 16.2 16.6 15.6 16.1  HCT 47.2 48.6 49.6 46.6 49.7  MCV 95.5 94.9 94.7 94.9 97.6  PLT 62* 56* 53* 54* 53*   Basic Metabolic Panel: Recent Labs  Lab 04/19/19 0600 04/20/19 0555 04/21/19 0535 04/22/19 0022 04/23/19 0303  NA 137 138 137 137 138  K 4.5 4.9 4.9 4.7 4.0  CL 100 101 100 102 99  CO2 25 25 24 26 28   GLUCOSE 116* 98 101* 108* 110*  BUN 22 20 19 19 22   CREATININE 1.02 0.94 1.08 0.93 0.86  CALCIUM 8.6* 8.6* 8.6* 8.3* 8.4*   Liver Function Tests: Recent Labs  Lab 04/18/19 0629 04/19/19 0600 04/20/19 0555 04/22/19 0022 04/23/19 0303  AST 61* 53* 49* 37 37  ALT 42 39 40 39 46*  ALKPHOS 273* 238* 197* 127* 107  BILITOT 1.5* 1.1 1.3* 1.3* 1.6*  PROT 7.0 6.7 6.7 6.2* 6.5  ALBUMIN 3.5 3.5 3.4* 3.1* 3.6   CBG: Recent Labs  Lab 04/23/19 0838 04/23/19 1213 04/23/19 1708 04/23/19 2121 04/24/19 0739  GLUCAP 151* 151* 140* 137* 118*   Anemia Panel: Recent Labs    04/22/19 0022  FERRITIN 291   Urine analysis:    Component Value Date/Time   BILIRUBINUR negative 12/28/2018 1717   PROTEINUR Negative 12/28/2018  1717   UROBILINOGEN 0.2 12/28/2018 1717   NITRITE negative 12/28/2018 1717   LEUKOCYTESUR Negative 12/28/2018 1717   Scheduled Meds: . aspirin  81 mg Oral Daily  . atorvastatin  40 mg Oral q1800  . chlorpheniramine-HYDROcodone  5 mL Oral Q12H  . famotidine  20 mg Oral BID  . fluticasone  2 spray Each Nare BID  . insulin aspart  0-9 Units Subcutaneous TID WC  . Ipratropium-Albuterol  1 puff Inhalation Q6H  . Rivaroxaban  15 mg Oral BID WC  . [START ON 05/03/2019] rivaroxaban  20 mg Oral Q supper  . tamsulosin  0.4 mg Oral QPC supper  . ursodiol  300 mg Oral BID  . vitamin C  500 mg Oral Daily  . zinc sulfate  220 mg Oral Daily   Continuous Infusions: . cefTRIAXone (ROCEPHIN)  IV 2 g (04/23/19 1145)      LOS: 15 days   Time spent: 35 minutes.  Patrecia Pour, MD [facility] [email or web address] 04/24/2019, 12:14 PM

## 2019-04-25 ENCOUNTER — Inpatient Hospital Stay (HOSPITAL_COMMUNITY): Payer: PRIVATE HEALTH INSURANCE

## 2019-04-25 LAB — COMPREHENSIVE METABOLIC PANEL
ALT: 51 U/L — ABNORMAL HIGH (ref 0–44)
AST: 37 U/L (ref 15–41)
Albumin: 3.3 g/dL — ABNORMAL LOW (ref 3.5–5.0)
Alkaline Phosphatase: 79 U/L (ref 38–126)
Anion gap: 13 (ref 5–15)
BUN: 19 mg/dL (ref 8–23)
CO2: 26 mmol/L (ref 22–32)
Calcium: 8.4 mg/dL — ABNORMAL LOW (ref 8.9–10.3)
Chloride: 100 mmol/L (ref 98–111)
Creatinine, Ser: 0.97 mg/dL (ref 0.61–1.24)
GFR calc Af Amer: >60 mL/min (ref >60)
GFR calc non Af Amer: >60 mL/min (ref >60)
Glucose, Bld: 114 mg/dL — ABNORMAL HIGH (ref 70–99)
Potassium: 4.2 mmol/L (ref 3.5–5.1)
Sodium: 139 mmol/L (ref 135–145)
Total Bilirubin: 0.9 mg/dL (ref 0.3–1.2)
Total Protein: 6.3 g/dL — ABNORMAL LOW (ref 6.5–8.1)

## 2019-04-25 LAB — CBC WITH DIFFERENTIAL/PLATELET
Abs Immature Granulocytes: 0.05 10*3/uL (ref 0.00–0.07)
Basophils Absolute: 0.1 10*3/uL (ref 0.0–0.1)
Basophils Relative: 1 %
Eosinophils Absolute: 0.7 10*3/uL — ABNORMAL HIGH (ref 0.0–0.5)
Eosinophils Relative: 9 %
HCT: 48.3 % (ref 39.0–52.0)
Hemoglobin: 15.7 g/dL (ref 13.0–17.0)
Immature Granulocytes: 1 %
Lymphocytes Relative: 14 %
Lymphs Abs: 1.1 10*3/uL (ref 0.7–4.0)
MCH: 31.5 pg (ref 26.0–34.0)
MCHC: 32.5 g/dL (ref 30.0–36.0)
MCV: 96.8 fL (ref 80.0–100.0)
Monocytes Absolute: 0.7 10*3/uL (ref 0.1–1.0)
Monocytes Relative: 8 %
Neutro Abs: 5.8 10*3/uL (ref 1.7–7.7)
Neutrophils Relative %: 67 %
Platelets: 54 10*3/uL — ABNORMAL LOW (ref 150–400)
RBC: 4.99 MIL/uL (ref 4.22–5.81)
RDW: 15 % (ref 11.5–15.5)
WBC: 8.4 10*3/uL (ref 4.0–10.5)
nRBC: 0 % (ref 0.0–0.2)

## 2019-04-25 LAB — GLUCOSE, CAPILLARY
Glucose-Capillary: 127 mg/dL — ABNORMAL HIGH (ref 70–99)
Glucose-Capillary: 179 mg/dL — ABNORMAL HIGH (ref 70–99)
Glucose-Capillary: 128 mg/dL — ABNORMAL HIGH (ref 70–99)
Glucose-Capillary: 146 mg/dL — ABNORMAL HIGH (ref 70–99)

## 2019-04-25 LAB — IMMATURE PLATELET FRACTION: Immature Platelet Fraction: 10.5 % — ABNORMAL HIGH (ref 1.2–8.6)

## 2019-04-25 LAB — D-DIMER, QUANTITATIVE: D-Dimer, Quant: 11.87 ug/mL-FEU — ABNORMAL HIGH (ref 0.00–0.50)

## 2019-04-25 MED ORDER — SODIUM CHLORIDE 0.9% FLUSH: 10.0000 mL | INTRAVENOUS | Status: DC | PRN

## 2019-04-25 MED ORDER — SODIUM CHLORIDE 0.9% FLUSH
10.0000 mL | Freq: Two times a day (BID) | INTRAVENOUS | Status: DC
  Administered 2019-04-25 – 2019-05-07 (×20): 10 mL

## 2019-04-25 NOTE — Progress Notes (Signed)
Occupational Therapy Treatment Patient Details Name: [name] MRN: [id number] DOB: 1955-12-20 Today's Date: 04/25/2019    History of present illness 63 y/o male w/ hx liver disorder, HTN, HLD, R BKA, GERD presented to ED with SOB, cough, fever and chills, admitted with dx acute hypoxic resp failure sec to SARS COVID 19.His follow-up CT chest showed segmental and subsegmental pulmonary embolism within branches of the right lower lobe. Patient noted to have rt hand weakness and incoordination with head CT 04/17/19 showing no acute changes. 04/23/19 pt reports no improvement and no worsening of rt hand.    OT comments  Pt continues to make progress in therapy, demonstrating decreased dependence on O2. Continued education with pt on breathing exercises with fair recall and follow through. Pt engaged in standing tolerance task to increase strength and endurance required for ADLs and transfers. Pt tolerated standing 1 x 2 min 45 sec, 1 x 2 min, and 1 x 3 min with supervision. Noted 0 instances of LOB. Pt initially on 5L HFNC at start of session, increasing to 6L HFNC during first stand due to respiratory distress. Pt desat to 88%, noting quick return back to high 90s. Attempted standing rest break with each stand, however pt unable to decrease HR or RR. Pt completed additional stands on 5L of O2 with O2 SATs decreasing to mid 80s. Quick return back to high 90s with seated rest break. Attempted to use flutter valve during seated rest break to assist with decreasing RR with minimal change noted. HR fluctuated between 96-119 bpm during activity. Able to titrate pt down from 5L to 2L  while seated in bedside chair with O2 SATs at 97%. Updated RN. 3/4 DOE. Pt reports increased anxiety related to SOB. Pt would benefit from additional rehab at [facility] prior to discharge home. Pt is able to tolerate 3 hours of therapy.    Follow Up Recommendations  CIR(Pt able to tolerate 3 hours of therapy)    Equipment  Recommendations       Recommendations for Other Services      Precautions / Restrictions Precautions Precautions: Fall Precaution Comments: R BKA Required Braces or Orthoses: Other Brace(right prosthetic leg) Restrictions Weight Bearing Restrictions: No       Mobility Bed Mobility                  Transfers Overall transfer level: Needs assistance Equipment used: Rolling walker (2 wheeled) Transfers: Sit to/from Stand Sit to Stand: Supervision         General transfer comment: Pt tolerated standing x 3 this date to engage in sponge bathing task.    Balance     Sitting balance-Leahy Scale: Normal Sitting balance - Comments: edge of chair     Standing balance-Leahy Scale: Fair Standing balance comment: Minimal use of RW with BUEs.                           ADL either performed or assessed with clinical judgement   ADL       Grooming: Standing;Supervision/safety Grooming Details (indicate cue type and reason): Pt able to wash hands and face while standing with RW. Upper Body Bathing: Standing;Supervision/ safety   Lower Body Bathing: Supervison/ safety;Sit to/from stand       Lower Body Dressing: Set up;Sitting/lateral leans Lower Body Dressing Details (indicate cue type and reason): to don prosthetic leg  Vision       Perception     Praxis      Cognition Arousal/Alertness: Awake/alert Behavior During Therapy: WFL for tasks assessed/performed Overall Cognitive Status: Within Functional Limits for tasks assessed                                 General Comments: Pt unable to recall what was learned from pervious session in regards to fall precautions, activity modifications, energy conservation, or relaxation strategies.        Exercises Exercises: Other exercises Other Exercises Other Exercises: Flutter valve x 10 Other Exercises: Incentive spirometer x 10. Pulling 746mL. Other  Exercises: Pt able to use pursed lip breathing throughout requiring min to mod cues on technique.   Shoulder Instructions       General Comments Pt on 5L HFNC at start of session with O2 SATs at 98%. Following activity able to titrate pt down to 2L [place] with O2 SATs at 97%. Continued education with pt on breathing exercises and anxiety management.      Pertinent Vitals/ Pain       Pain Assessment: No/denies pain  Home Living                                          Prior Functioning/Environment              Frequency           Progress Toward Goals  OT Goals(current goals can now be found in the care plan section)  Progress towards OT goals: Progressing toward goals  ADL Goals Pt Will Perform Lower Body Bathing: with modified independence;sit to/from stand Pt Will Perform Lower Body Dressing: with modified independence;sit to/from stand Pt Will Perform Toileting - Clothing Manipulation and hygiene: with modified independence Pt Will Perform Tub/Shower Transfer: with modified independence;ambulating;Shower transfer;3 in 1 Pt/caregiver will Perform Home Exercise Program: Independently;With theraband;Increased strength;Both right and left upper extremity;With written HEP provided Additional ADL Goal #1: Pt will independently verbalize 3 strategies to reduce risk of falls  Plan Discharge plan remains appropriate    Co-evaluation                 AM-PAC OT "6 Clicks" Daily Activity     Outcome Measure   Help from another person eating meals?: None Help from another person taking care of personal grooming?: A Little Help from another person toileting, which includes using toliet, bedpan, or urinal?: A Little Help from another person bathing (including washing, rinsing, drying)?: A Little Help from another person to put on and taking off regular upper body clothing?: A Little Help from another person to put on and taking off regular lower body  clothing?: A Little 6 Click Score: 19    End of Session Equipment Utilized During Treatment: Oxygen;Rolling walker  OT Visit Diagnosis: Muscle weakness (generalized) (M62.81)   Activity Tolerance Patient limited by fatigue(Limited by SOB)   Patient Left in chair;with call bell/phone within reach   Nurse Communication Mobility status        Time: [id number] OT Time Calculation (min): 54 min  Charges: OT General Charges $OT Visit: 1 Visit OT Treatments $Self Care/Home Management : 8-22 mins $Therapeutic Activity: 23-37 mins $Therapeutic Exercise: 8-22 mins  Mauri Brooklyn OTR/L [phone]    Mauri Brooklyn 04/25/2019, 3:47 PM

## 2019-04-25 NOTE — Progress Notes (Addendum)
PROGRESS NOTE    KANOA LAMKE  [id number] DOB: 1955-10-21 DOA: 04/08/2019 PCP: [name], FNP   Brief Narrative:  DOMONICK FILTER is Deiontae Rabel 63 y.o. male with Jaciel Diem history of HTN, HLD, and right BKA who presented 11/15 with fever and cough persisting despite antibiotics and prednisone as an outpatient. He was found to be hypoxic with left upper lobe infiltrate and SARS-CoV-2 PCR positive. Remdesivir and steroids were given. Due to persistent hypoxemia, convalescent plasma and tocilizumab were administered and CTA chest performed. This demonstrated segmental and subsegmental pulmonary emboli within the RLL. Therapeutic anticoagulation has been given, though the patient's hypoxemia continues with slow recovery. Lasix has been given intermittently and antibiotics were started when CXR demonstrated Ravin Bendall possible bacterial coinfection 11/25.   Assessment & Plan:   Principal Problem:   Pneumonia due to COVID-19 virus Active Problems:   Acute respiratory failure with hypoxia (HCC)   Essential hypertension   HLD (hyperlipidemia)   Community acquired pneumonia   Transaminitis   Hyperglycemia   AKI (acute kidney injury) ([place])   Bilateral pulmonary embolism (HCC)   Lobar pneumonia (HCC)   Acute hypoxic respiratory failure: Multifactorial, due to covid-19 pneumonia and acute PE as well as possible superimposed bacterial pneumonia.  - Hypoxemia improving slowly, but remains severe - currently on about 8 L [place].  - D dimer slightly improved today, continue to monitor - CRP has normalized while d-dimer remains >20. We have switched to po prednisone on 11/27. Will stop this now that it's been given >2 weeks. Has received tocilizumab and convalescent plasma on 11/17.  - I/O, daily weights - Continue ceftriaxone x7 days (11/25 - 12/1) - Continue therapeutic anticoagulation.  - Continue IS, FV, OOB, antitussives - Airborne/contact precautions. - CIR recommended disposition, will need negative testing.   - CXR with multifocal pneumonia, improved in R lung base  COVID-19 Labs  Recent Labs    04/25/19 0430  DDIMER 11.87*    Lab Results  Component Value Date   SARSCOV2NAA POSITIVE (Deerica Waszak) 04/08/2019   SARSCOV2NAA Detected (Joaquim Tolen) 04/04/2019   LLE DVT and Acute PE: continue anticoagulation as noted  Thrombocytopenia: Reports he may have had this episodically in the past but denies any liver dysfunction/hematologic consultations or personal or family history of this. At intermediate risk of HITT (4T's = 5). Would have expected improvement if this was HIT, though recovery can be delayed due to covid. Also Shaka Zech concern would be alloimmunity with convalescent plasma though this was given 11/17 and platelets noted to be normal 11/19. No evidence of hemolysis.  - Stopped heparin and started xarelto at the time when platelets were still wnl. Platelet count once found to have dropped has remained very stable just above 50k. No major bleeding noted and hgb is wnl/stable.  - Discussed the case with Dr. Irene Limbo, hematology, who recommended checking SRA (pending) and immature platelet fraction (elevated) in addition to HIT Ab (wnl). Platelet count is stable.  Will discuss again with Dr. Irene Limbo. - Continue monitoring platelets. If bleeding develops or platelet count dips near to 30k, would suggest platelet transfusion. If refractory bleeding/worsened thrombocytopenia, would have to consider decreasing dose of xarelto vs. argatroban.   T2DM with steroid-induced hyperglycemia: HbA1c 7.1%.  - Continue SSI, improving as steroids have been tapered  HLD:  - Continue statin  BPH:  - Continue flomax  Obesity: BMI 37. Noted.  Right hand apraxia and edema: Appears stable. CT head without acute infarct noted initially. Not consistent with seizure. ?Critical illness  neuropathy.  - Elevated RUE - Definite deficit noted. Would recheck CT once patient amenable, currently stating he's unsure he'd tolerate laying flat that  long (stable today). - I think an MRI would be appropriate when he's doing better from O2 standpoint. - will discuss with neurology  - OT reconsulted to concentrate on this.  - If treating as Allyce Bochicchio stroke, he would get statin (which he's on) and antiplatelet. 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  - Control risk factors.  DVT prophylaxis: xarelto  Code Status: full  Family Communication: none at bedside - called son, no answer Disposition Plan: pending further improvement  Consultants:   none  Procedures:  Echo IMPRESSIONS    1. Left ventricular ejection fraction, by visual estimation, is 60 to 65%. The left ventricle has normal function. There is mildly increased left ventricular hypertrophy.  2. Left ventricular diastolic parameters are consistent with Grade I diastolic dysfunction (impaired relaxation).  3. Global right ventricle has normal systolic function.The right ventricular size is normal.  4. Left atrial size was normal.  5. Right atrial size was normal.  6. The mitral valve is normal in structure. No evidence of mitral valve regurgitation. No evidence of mitral stenosis.  7. The tricuspid valve is normal in structure. Tricuspid valve regurgitation is mild.  8. The aortic valve is tricuspid. Aortic valve regurgitation is not visualized. No evidence of aortic valve sclerosis or stenosis.  9. The pulmonic valve was not well visualized. Pulmonic valve regurgitation is not visualized. 10. Technically difficult; normal LV systolic function; mild LVH; grade1 diastolic dysfunction; mild TR with peak velocity of 3.8 m/s suggestive of at least moderate pulmonary hypertension; IVC not visualized and therefore PASP cannot be estimated.  LE [place] Summary: Right: There is no evidence of deep vein thrombosis in the lower extremity. Left: Findings consistent with acute deep vein thrombosis  involving the left posterior tibial veins.  Antimicrobials:  Anti-infectives (From admission, onward)   Start     Dose/Rate Route Frequency Ordered Stop   04/19/19 1130  cefTRIAXone (ROCEPHIN) 2 g in sodium chloride 0.9 % 100 mL IVPB     2 g 200 mL/hr over 30 Minutes Intravenous Every 24 hours 04/19/19 0932 04/26/19 1129   04/18/19 1130  cefTRIAXone (ROCEPHIN) 1 g in sodium chloride 0.9 % 100 mL IVPB  Status:  Discontinued     1 g 200 mL/hr over 30 Minutes Intravenous Every 24 hours 04/18/19 1123 04/19/19 0932   04/10/19 1000  remdesivir 100 mg in sodium chloride 0.9 % 250 mL IVPB     100 mg 500 mL/hr over 30 Minutes Intravenous Every 24 hours 04/09/19 1321 04/13/19 1933   04/09/19 2200  cefTRIAXone (ROCEPHIN) 2 g in sodium chloride 0.9 % 100 mL IVPB  Status:  Discontinued     2 g 200 mL/hr over 30 Minutes Intravenous Every 24 hours 04/09/19 0400 04/10/19 0827   04/09/19 2200  azithromycin (ZITHROMAX) 500 mg in sodium chloride 0.9 % 250 mL IVPB  Status:  Discontinued     500 mg 250 mL/hr over 60 Minutes Intravenous Every 24 hours 04/09/19 0400 04/10/19 0827   04/09/19 1400  remdesivir 200 mg in sodium chloride 0.9 % 250 mL IVPB     200 mg 500 mL/hr over 30 Minutes Intravenous Once 04/09/19 1321 04/09/19 1642   04/09/19 0000  cefTRIAXone (ROCEPHIN) 1 g in sodium chloride 0.9 %  100 mL IVPB     1 g 200 mL/hr over 30 Minutes Intravenous  Once 04/08/19 2353 04/09/19 0115   04/09/19 0000  azithromycin (ZITHROMAX) 500 mg in sodium chloride 0.9 % 250 mL IVPB     500 mg 250 mL/hr over 60 Minutes Intravenous  Once 04/08/19 2353 04/09/19 0132     Subjective: Feels frustrated with SOB with activity Difficulty with fine motor activities to R hand  Objective: Vitals:   04/25/19 0012 04/25/19 0454 04/25/19 0742 04/25/19 1138  BP: 117/73 125/82 121/74 103/70  Pulse: 94 99 100 99  Resp: (!) 21 20 18 20   Temp: 97.8 F (36.6 C) 97.8 F (36.6 C) 97.9 F (36.6 C) 98.2 F (36.8 C)  TempSrc:  Oral Oral Oral Oral  SpO2: 97% 95% 91% 94%  Weight:      Height:        Intake/Output Summary (Last 24 hours) at 04/25/2019 1205 Last data filed at 04/25/2019 0800 Gross per 24 hour  Intake 1020 ml  Output 350 ml  Net 670 ml   Filed Weights   04/09/19 0300  Weight: 104.3 kg    Examination:  General exam: Appears calm and comfortable  Respiratory system: Clear to auscultation. Respiratory effort normal. Cardiovascular system: RRR Gastrointestinal system: Abdomen is nondistended, soft and nontender. Central nervous system: Alert and oriented. No focal neurological deficits. Extremities: R BKA, no LEE Skin: No rashes, lesions or ulcers Psychiatry: Judgement and insight appear normal. Mood & affect appropriate.     Data Reviewed: I have personally reviewed following labs and imaging studies  CBC: Recent Labs  Lab 04/21/19 0535 04/22/19 0022 04/23/19 0800 04/24/19 0920 04/25/19 0430  WBC 21.4* 16.9* 13.7* 8.6 8.4  NEUTROABS 17.8* 13.9* 10.9* 6.4 5.8  HGB 16.6 15.6 16.1 15.9 15.7  HCT 49.6 46.6 49.7 49.1 48.3  MCV 94.7 94.9 97.6 96.3 96.8  PLT 53* 54* 53* 56* 54*   Basic Metabolic Panel: Recent Labs  Lab 04/21/19 0535 04/22/19 0022 04/23/19 0303 04/24/19 0920 04/25/19 0430  NA 137 137 138 138 139  K 4.9 4.7 4.0 4.3 4.2  CL 100 102 99 99 100  CO2 24 26 28 31 26   GLUCOSE 101* 108* 110* 113* 114*  BUN 19 19 22 17 19   CREATININE 1.08 0.93 0.86 0.96 0.97  CALCIUM 8.6* 8.3* 8.4* 8.2* 8.4*   GFR: Estimated Creatinine Clearance: 88.2 mL/min (by C-G formula based on SCr of 0.97 mg/dL). Liver Function Tests: Recent Labs  Lab 04/20/19 0555 04/22/19 0022 04/23/19 0303 04/24/19 0920 04/25/19 0430  AST 49* 37 37 39 37  ALT 40 39 46* 50* 51*  ALKPHOS 197* 127* 107 83 79  BILITOT 1.3* 1.3* 1.6* 1.8* 0.9  PROT 6.7 6.2* 6.5 6.4* 6.3*  ALBUMIN 3.4* 3.1* 3.6 3.4* 3.3*   No results for input(s): LIPASE, AMYLASE in the last 168 hours. No results for input(s):  AMMONIA in the last 168 hours. Coagulation Profile: No results for input(s): INR, PROTIME in the last 168 hours. Cardiac Enzymes: No results for input(s): CKTOTAL, CKMB, CKMBINDEX, TROPONINI in the last 168 hours. BNP (last 3 results) No results for input(s): PROBNP in the last 8760 hours. HbA1C: No results for input(s): HGBA1C in the last 72 hours. CBG: Recent Labs  Lab 04/24/19 1204 04/24/19 1704 04/24/19 2109 04/25/19 0740 04/25/19 1136  GLUCAP 141* 170* 160* 127* 179*   Lipid Profile: No results for input(s): CHOL, HDL, LDLCALC, TRIG, CHOLHDL, LDLDIRECT in the last 72 hours. Thyroid  Function Tests: No results for input(s): TSH, T4TOTAL, FREET4, T3FREE, THYROIDAB in the last 72 hours. Anemia Panel: No results for input(s): VITAMINB12, FOLATE, FERRITIN, TIBC, IRON, RETICCTPCT in the last 72 hours. Sepsis Labs: No results for input(s): PROCALCITON, LATICACIDVEN in the last 168 hours.  No results found for this or any previous visit (from the past 240 hour(s)).       Radiology Studies: Dg Chest Port 1 View  Result Date: 04/25/2019 CLINICAL DATA:  Hypoxia in Endya Austin patient who is COVID-19 positive. EXAM: PORTABLE CHEST 1 VIEW COMPARISON:  Single-view of the chest 04/18/2019 and 04/13/2019 FINDINGS: Extensive bilateral airspace disease persists. Aeration in the right lung base is mildly improved since the most recent exam. No pneumothorax or pleural effusion. Heart size is normal. No acute or focal bony abnormality. IMPRESSION: Multifocal pneumonia appears mildly improved in the right lung base. Electronically Signed   By: Inge Rise M.D.   On: 04/25/2019 11:28        Scheduled Meds: . aspirin  81 mg Oral Daily  . atorvastatin  40 mg Oral q1800  . chlorpheniramine-HYDROcodone  5 mL Oral Q12H  . famotidine  20 mg Oral BID  . fluticasone  2 spray Each Nare BID  . insulin aspart  0-9 Units Subcutaneous TID WC  . Ipratropium-Albuterol  1 puff Inhalation Q6H  .  Rivaroxaban  15 mg Oral BID WC  . [START ON 05/03/2019] rivaroxaban  20 mg Oral Q supper  . tamsulosin  0.4 mg Oral QPC supper  . ursodiol  300 mg Oral BID  . vitamin C  500 mg Oral Daily  . zinc sulfate  220 mg Oral Daily   Continuous Infusions: . cefTRIAXone (ROCEPHIN)  IV Stopped (04/24/19 2105)     LOS: 16 days    Time spent: over 30 min    Fayrene Helper, MD [facility] Pager AMION  If 7PM-7AM, please contact night-coverage [email or web address] Password [facility] 04/25/2019, 12:05 PM

## 2019-04-25 NOTE — Progress Notes (Addendum)
0745: Assumed care of Pt from night RN. Pt alert, denies pain, NSR to ST, SpO2 92% on 7 L [place]. Resting comfortably in bed, however gets significantly SOB with minimal exertion or repositioning in bed. Instructed on how to use call bell for assistance, will monitor.  0800: Confirmed with MD ok to give xarelto.   1040: Called spouse to give updates with no answer. Left message  1145: Wife updated over phone.   1230: Loss of IV access. 3 RN's attempted unsuccessfully, IV team consulted and MD made aware.   1540: Reassessed, resting comfortably in chair. O2 down to 1 L [place]. Denies needs at this time  1820: IV team at bedside

## 2019-04-26 LAB — COMPREHENSIVE METABOLIC PANEL
ALT: 42 U/L (ref 0–44)
AST: 33 U/L (ref 15–41)
Albumin: 3.3 g/dL — ABNORMAL LOW (ref 3.5–5.0)
Alkaline Phosphatase: 65 U/L (ref 38–126)
Anion gap: 9 (ref 5–15)
BUN: 17 mg/dL (ref 8–23)
CO2: 27 mmol/L (ref 22–32)
Calcium: 8.4 mg/dL — ABNORMAL LOW (ref 8.9–10.3)
Chloride: 101 mmol/L (ref 98–111)
Creatinine, Ser: 0.87 mg/dL (ref 0.61–1.24)
GFR calc Af Amer: >60 mL/min (ref >60)
GFR calc non Af Amer: >60 mL/min (ref >60)
Glucose, Bld: 142 mg/dL — ABNORMAL HIGH (ref 70–99)
Potassium: 4.1 mmol/L (ref 3.5–5.1)
Sodium: 137 mmol/L (ref 135–145)
Total Bilirubin: 1.4 mg/dL — ABNORMAL HIGH (ref 0.3–1.2)
Total Protein: 6.2 g/dL — ABNORMAL LOW (ref 6.5–8.1)

## 2019-04-26 LAB — CBC WITH DIFFERENTIAL/PLATELET
Abs Immature Granulocytes: 0.02 10*3/uL (ref 0.00–0.07)
Basophils Absolute: 0.1 10*3/uL (ref 0.0–0.1)
Basophils Relative: 1 %
Eosinophils Absolute: 0.5 10*3/uL (ref 0.0–0.5)
Eosinophils Relative: 6 %
HCT: 43.5 % (ref 39.0–52.0)
Hemoglobin: 14.2 g/dL (ref 13.0–17.0)
Immature Granulocytes: 0 %
Lymphocytes Relative: 9 %
Lymphs Abs: 0.8 10*3/uL (ref 0.7–4.0)
MCH: 31.3 pg (ref 26.0–34.0)
MCHC: 32.6 g/dL (ref 30.0–36.0)
MCV: 95.8 fL (ref 80.0–100.0)
Monocytes Absolute: 0.6 10*3/uL (ref 0.1–1.0)
Monocytes Relative: 7 %
Neutro Abs: 6.4 10*3/uL (ref 1.7–7.7)
Neutrophils Relative %: 77 %
Platelets: 56 10*3/uL — ABNORMAL LOW (ref 150–400)
RBC: 4.54 MIL/uL (ref 4.22–5.81)
RDW: 14.8 % (ref 11.5–15.5)
WBC: 8.4 10*3/uL (ref 4.0–10.5)
nRBC: 0 % (ref 0.0–0.2)

## 2019-04-26 LAB — GLUCOSE, CAPILLARY
Glucose-Capillary: 121 mg/dL — ABNORMAL HIGH (ref 70–99)
Glucose-Capillary: 133 mg/dL — ABNORMAL HIGH (ref 70–99)
Glucose-Capillary: 146 mg/dL — ABNORMAL HIGH (ref 70–99)
Glucose-Capillary: 246 mg/dL — ABNORMAL HIGH (ref 70–99)

## 2019-04-26 LAB — URIC ACID: Uric Acid, Serum: 5.7 mg/dL (ref 3.7–8.6)

## 2019-04-26 LAB — MAGNESIUM: Magnesium: 2 mg/dL (ref 1.7–2.4)

## 2019-04-26 LAB — C-REACTIVE PROTEIN: CRP: 0.7 mg/dL (ref <1.0)

## 2019-04-26 LAB — FERRITIN: Ferritin: 254 ng/mL (ref 24–336)

## 2019-04-26 LAB — D-DIMER, QUANTITATIVE: D-Dimer, Quant: 9.88 ug/mL-FEU — ABNORMAL HIGH (ref 0.00–0.50)

## 2019-04-26 MED ORDER — OXYCODONE HCL 5 MG PO TABS
5.0000 mg | ORAL_TABLET | ORAL | Status: DC | PRN
  Administered 2019-04-26 – 2019-05-07 (×5): 5 mg via ORAL
  Filled 2019-04-26 (×5): qty 1

## 2019-04-26 MED ORDER — PREDNISONE 20 MG PO TABS
30.0000 mg | ORAL_TABLET | Freq: Every day | ORAL | Status: AC
  Administered 2019-04-29 – 2019-05-01 (×3): 30 mg via ORAL
  Filled 2019-04-26 (×3): qty 1

## 2019-04-26 MED ORDER — PREDNISONE 20 MG PO TABS
40.0000 mg | ORAL_TABLET | Freq: Every day | ORAL | Status: AC
  Administered 2019-04-26 – 2019-04-28 (×3): 40 mg via ORAL
  Filled 2019-04-26 (×3): qty 2

## 2019-04-26 MED ORDER — PREDNISONE 5 MG PO TABS
10.0000 mg | ORAL_TABLET | Freq: Every day | ORAL | Status: AC
  Administered 2019-05-05 – 2019-05-07 (×3): 10 mg via ORAL
  Filled 2019-04-26 (×4): qty 2

## 2019-04-26 MED ORDER — PREDNISONE 20 MG PO TABS
20.0000 mg | ORAL_TABLET | Freq: Every day | ORAL | Status: AC
  Administered 2019-05-02 – 2019-05-04 (×3): 20 mg via ORAL
  Filled 2019-04-26 (×3): qty 1

## 2019-04-26 NOTE — Progress Notes (Addendum)
0745: Assumed care of Pt from night RN. Pt alert, C/o pain in his left foot. On assessment, foot red, swollen more than yesterday, hot and painful just on the big toe joint and radiating slightly to the bottom. Pulses present with doppler. Pt stated he has had gout pain before, "but nothing like this painful." MD made aware. Otherwise, VSS, SpO2 93% on 1 L [place]. Call bell within reach  1030: Pt wants to stay in bed for now, he doesn't want to put weight on his foot. States he does feel better with the pain medicine. No other needs at this time, will monitor  1325: Called spouse to update, but no answer. Left message. Pt resting comfortable in bed eating lunch. No needs at this time  1530: Reassessed, VSS. Needed 10 L HFNC during transfer from bed to [facility] and back, weaned back down to 4 L at this time. Denies needs at this time, will monitor  1827: Pt resting comfortable in bed, denies needs at this time, call bell within reach  1840: Wife updated via phone, all questions answered

## 2019-04-26 NOTE — Progress Notes (Signed)
Physical Therapy Treatment Patient Details Name: [name] MRN: [id number] DOB: 01/07/1956 Today's Date: 04/26/2019    History of Present Illness 63 y/o male w/ hx liver disorder, HTN, HLD, R BKA, GERD presented to ED with SOB, cough, fever and chills, admitted with dx acute hypoxic resp failure sec to SARS COVID 19.His follow-up CT chest showed segmental and subsegmental pulmonary embolism within branches of the right lower lobe. Patient noted to have rt hand weakness and incoordination with head CT 04/17/19 showing no acute changes. 04/23/19 pt reports no improvement and no worsening of rt hand.     PT Comments    Pt c/o pain in L foot today, states this is from gout flare up. Initially pt was found on 1l/min via HFNC and sats reading at 90s on pedi earlobe probe. Completed supine to sit EOB and attempted to see if Nelcore forehead probe would give same results. Applied this and sats reading in high 70s and low 80s, 10% less than earlobe probe. Pt in no more distress than after initial transfer. Placed a Nelcor finger probe and this had identical readings as forehead probe. Pt increased to 5L/min via HFNC and 02 sats slowly increased to high 80s, placed pt on NRB at 15 for a 1 minute and sats increased to 98% throughout all this was using flutter valve to increase saturations, which it minimally did, had pt pursed lip breathing to increase sats. Once NRB taken off pt stayed in 90s on 5L/min via HFNC. Pt requested to sit on BSC, was able to transfer with min-mod a and set up, 02 increased to 8L/min to maintain in low 80s for commode use. At this time nurse in room and is ok with sats in mid-high 80s. Pt is still a great candidate for CIR prior to going home in order to return to his PLOF, he was highly independent at PLOF, living home with spouse and working a [place].    Follow Up Recommendations  CIR     Equipment Recommendations  Rolling walker with 5" wheels    Recommendations for Other  Services       Precautions / Restrictions Precautions Precautions: Fall Precaution Comments: R BKA Required Braces or Orthoses: Other Brace Other Brace: prosthetic leg, congenital Restrictions Weight Bearing Restrictions: No    Mobility  Bed Mobility Overal bed mobility: Needs Assistance Bed Mobility: Supine to Sit;Sit to Supine     Supine to sit: Supervision;Min guard Sit to supine: Supervision;Min guard      Transfers Overall transfer level: Needs assistance Equipment used: 1 person hand held assist;None Transfers: Sit to/from [employer] Sit to Stand: Min assist;Mod assist   Squat pivot transfers: Min assist;Mod assist     General transfer comment: more so sec to pain in L great toe and did not have prosthesis on at time  Ambulation/Gait             General Gait Details: did not attempt sec to c/o pain in L LE and also safety   Stairs             Wheelchair Mobility    Modified Rankin (Stroke Patients Only)       Balance Overall balance assessment: Needs assistance Sitting-balance support: Feet supported Sitting balance-Leahy Scale: Good Sitting balance - Comments: EOB   Standing balance support: During functional activity Standing balance-Leahy Scale: Fair  Cognition Arousal/Alertness: Awake/alert Behavior During Therapy: WFL for tasks assessed/performed Overall Cognitive Status: Within Functional Limits for tasks assessed                                 General Comments: still having some memory deficits with reaclling has discussed same issues with previous therapist      Exercises Other Exercises Other Exercises: Flutter valve x multi times to increase 02 sats    General Comments        Pertinent Vitals/Pain Pain Assessment: Faces Faces Pain Scale: Hurts whole lot Pain Location: L great toe, states has gout Pain Descriptors / Indicators:  Grimacing;Guarding;Moaning Pain Intervention(s): Monitored during session;Limited activity within patient's tolerance    Home Living                      Prior Function            PT Goals (current goals can now be found in the care plan section) Acute Rehab PT Goals Patient Stated Goal: discussed need for CIR and return to PLOF Progress towards PT goals: Progressing toward goals    Frequency    Min 3X/week      PT Plan Current plan remains appropriate    Co-evaluation              AM-PAC PT "6 Clicks" Mobility   Outcome Measure  Help needed turning from your back to your side while in a flat bed without using bedrails?: None Help needed moving from lying on your back to sitting on the side of a flat bed without using bedrails?: None Help needed moving to and from a bed to a chair (including a wheelchair)?: A Lot Help needed standing up from a chair using your arms (e.g., wheelchair or bedside chair)?: A Lot Help needed to walk in hospital room?: A Lot Help needed climbing 3-5 steps with a railing? : Total 6 Click Score: 15    End of Session Equipment Utilized During Treatment: Oxygen Activity Tolerance: Treatment limited secondary to medical complications (Comment);Patient limited by pain;Patient limited by lethargy;Patient limited by fatigue Patient left: Other (comment)(BSC w/ nurse in room ) Nurse Communication: Mobility status;Precautions PT Visit Diagnosis: Muscle weakness (generalized) (M62.81);Other abnormalities of gait and mobility (R26.89)     Time: [id number] PT Time Calculation (min) (ACUTE ONLY): 39 min  Charges:  $Therapeutic Activity: 8-22 mins $Self Care/Home Management: [place], PT    [name] 04/26/2019, 4:25 PM

## 2019-04-26 NOTE — Plan of Care (Signed)
Pt c/o L foot/greater toe pain, noted there is swelling, redness and painful touch once assessed. Tylenol given for pain, MD paged. No new orders given as of yet. No s/s of distress, VSS. Call bell within reach of pt, encouraged to use call bell for assistance.  Problem: Activity: Goal: Risk for activity intolerance will decrease Outcome: Progressing   Problem: Nutrition: Goal: Adequate nutrition will be maintained Outcome: Progressing   Problem: Elimination: Goal: Will not experience complications related to bowel motility Outcome: Progressing   Problem: Pain Managment: Goal: General experience of comfort will improve Outcome: Progressing   Problem: Safety: Goal: Ability to remain free from injury will improve Outcome: Progressing

## 2019-04-26 NOTE — Progress Notes (Addendum)
PROGRESS NOTE    [name]  [id number] DOB: [birth year] DOA: 04/08/2019 PCP: [name], FNP   Brief Narrative:  [name] is [name] 63 y.o. male with Banesa Tristan history of HTN, HLD, and right BKA who presented 11/15 with fever and cough persisting despite antibiotics and prednisone as an outpatient. He was found to be hypoxic with left upper lobe infiltrate and SARS-CoV-2 PCR positive. Remdesivir and steroids were given. Due to persistent hypoxemia, convalescent plasma and tocilizumab were administered and CTA chest performed. This demonstrated segmental and subsegmental pulmonary emboli within the RLL. Therapeutic anticoagulation has been given, though the patient's hypoxemia continues with slow recovery. Lasix has been given intermittently and antibiotics were started when CXR demonstrated Zemirah Krasinski possible bacterial coinfection 11/25.   Assessment & Plan:   Principal Problem:   Pneumonia due to COVID-19 virus Active Problems:   Acute respiratory failure with hypoxia (HCC)   Essential hypertension   HLD (hyperlipidemia)   Community acquired pneumonia   Transaminitis   Hyperglycemia   AKI (acute kidney injury) ([place])   Bilateral pulmonary embolism (HCC)   Lobar pneumonia (HCC)   Acute hypoxic respiratory failure: Multifactorial, due to covid-19 pneumonia and acute PE as well as possible superimposed bacterial pneumonia.  - Hypoxemia improving slowly, but remains severe - had been weaned to 1 L this morning, now requiring more O2 (will discuss with nurse, suspect this may have been after activity?) - D dimer slightly improved today, continue to monitor - CRP has normalized while d-dimer remains >20. We have switched to po prednisone on 11/27. Will stop this now that it's been given >2 weeks. Has received tocilizumab and convalescent plasma on 11/17.  - I/O, daily weights - Continue ceftriaxone x7 days (11/25 - 12/1) - Continue therapeutic anticoagulation.  - Continue IS, FV, OOB,  antitussives - Airborne/contact precautions. - CIR recommended disposition, will need negative testing.  Needs further improvement in resp status. - CXR with multifocal pneumonia, improved in R lung base  COVID-19 Labs  Recent Labs    04/25/19 0430 04/26/19 0600  DDIMER 11.87* 9.88*  FERRITIN  --  254  CRP  --  0.7    Lab Results  Component Value Date   SARSCOV2NAA POSITIVE ([facility]) 04/08/2019   SARSCOV2NAA Detected (Essance [facility]) 04/04/2019   LLE DVT and Acute PE: continue anticoagulation as noted  Thrombocytopenia: Reports he may have had this episodically in the past but denies any liver dysfunction/hematologic consultations or personal or family history of this. At intermediate risk of HITT (4T's = 5). Would have expected improvement if this was HIT, though recovery can be delayed due to covid. Also Karel Mowers concern would be alloimmunity with convalescent plasma though this was given 11/17 and platelets noted to be normal 11/19. No evidence of hemolysis.  - Stopped heparin and started xarelto at the time when platelets were still wnl. Platelet count once found to have dropped has remained very stable just above 50k. No major bleeding noted and hgb is wnl/stable.  - Discussed the case with Dr. Irene Limbo, hematology, who recommended checking SRA (pending) and immature platelet fraction (elevated) in addition to HIT Ab (wnl). Platelet count is stable.  Will discuss again with Dr. Irene Limbo - notes most likely 2/2 viral ifnection. - Continue monitoring platelets. If bleeding develops or platelet count dips near to 30k, would suggest platelet transfusion. If refractory bleeding/worsened thrombocytopenia, would have to consider decreasing dose of xarelto vs. argatroban.   T2DM with steroid-induced hyperglycemia: HbA1c 7.1%.  - Continue SSI,  improving as steroids have been tapered  HLD:  - Continue statin  BPH:  - Continue flomax  Obesity: BMI 37. Noted.  Right hand apraxia and edema: Appears stable. CT  head without acute infarct noted initially. Not consistent with seizure. ?Critical illness neuropathy.  - Elevated RUE - Definite deficit noted. Would recheck CT once patient amenable, currently stating he's unsure he'd tolerate laying flat that long (stable today). - I think an MRI would be appropriate when he's doing better from O2 standpoint. - will discuss with neurology (agreed with above) - OT reconsulted to concentrate on this.  - If treating as Lidie Glade stroke, he would get statin (which he's on) and antiplatelet. 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  - Control risk factors.  Suspected Gout Flare: L first MTP joint with pain, start steroid taper, follow uric acid level  DVT prophylaxis: xarelto  Code Status: full  Family Communication: none at bedside - called son, no answer 12/2 Disposition Plan: pending further improvement  Consultants:   none  Procedures:  Echo IMPRESSIONS    1. Left ventricular ejection fraction, by visual estimation, is 60 to 65%. The left ventricle has normal function. There is mildly increased left ventricular hypertrophy.  2. Left ventricular diastolic parameters are consistent with Grade I diastolic dysfunction (impaired relaxation).  3. Global right ventricle has normal systolic function.The right ventricular size is normal.  4. Left atrial size was normal.  5. Right atrial size was normal.  6. The mitral valve is normal in structure. No evidence of mitral valve regurgitation. No evidence of mitral stenosis.  7. The tricuspid valve is normal in structure. Tricuspid valve regurgitation is mild.  8. The aortic valve is tricuspid. Aortic valve regurgitation is not visualized. No evidence of aortic valve sclerosis or stenosis.  9. The pulmonic valve was not well visualized. Pulmonic valve regurgitation is not visualized. 10. Technically difficult; normal LV  systolic function; mild LVH; grade1 diastolic dysfunction; mild TR with peak velocity of 3.8 m/s suggestive of at least moderate pulmonary hypertension; IVC not visualized and therefore PASP cannot be estimated.  LE [place] Summary: Right: There is no evidence of deep vein thrombosis in the lower extremity. Left: Findings consistent with acute deep vein thrombosis involving the left posterior tibial veins.  Antimicrobials:  Anti-infectives (From admission, onward)   Start     Dose/Rate Route Frequency Ordered Stop   04/19/19 1130  cefTRIAXone (ROCEPHIN) 2 g in sodium chloride 0.9 % 100 mL IVPB     2 g 200 mL/hr over 30 Minutes Intravenous Every 24 hours 04/19/19 0932 04/26/19 1129   04/18/19 1130  cefTRIAXone (ROCEPHIN) 1 g in sodium chloride 0.9 % 100 mL IVPB  Status:  Discontinued     1 g 200 mL/hr over 30 Minutes Intravenous Every 24 hours 04/18/19 1123 04/19/19 0932   04/10/19 1000  remdesivir 100 mg in sodium chloride 0.9 % 250 mL IVPB     100 mg 500 mL/hr over 30 Minutes Intravenous Every 24 hours 04/09/19 1321 04/13/19 1933   04/09/19 2200  cefTRIAXone (ROCEPHIN) 2 g in sodium chloride 0.9 % 100 mL IVPB  Status:  Discontinued     2 g 200 mL/hr over 30 Minutes Intravenous Every 24 hours 04/09/19 0400 04/10/19 0827   04/09/19 2200  azithromycin (ZITHROMAX) 500 mg in sodium chloride 0.9 % 250 mL IVPB  Status:  Discontinued  500 mg 250 mL/hr over 60 Minutes Intravenous Every 24 hours 04/09/19 0400 04/10/19 0827   04/09/19 1400  remdesivir 200 mg in sodium chloride 0.9 % 250 mL IVPB     200 mg 500 mL/hr over 30 Minutes Intravenous Once 04/09/19 1321 04/09/19 1642   04/09/19 0000  cefTRIAXone (ROCEPHIN) 1 g in sodium chloride 0.9 % 100 mL IVPB     1 g 200 mL/hr over 30 Minutes Intravenous  Once 04/08/19 2353 04/09/19 0115   04/09/19 0000  azithromycin (ZITHROMAX) 500 mg in sodium chloride 0.9 % 250 mL IVPB     500 mg 250 mL/hr over 60 Minutes Intravenous  Once 04/08/19 2353 04/09/19  0132     Subjective: C/o pain in L toe  Objective: Vitals:   04/26/19 0500 04/26/19 0742 04/26/19 1500 04/26/19 1506  BP:  113/78    Pulse:  97    Resp:  17    Temp:  98.4 F (36.9 C)    TempSrc:  Oral    SpO2:  93% (!) 75% 93%  Weight: 93.9 kg     Height:        Intake/Output Summary (Last 24 hours) at 04/26/2019 1511 Last data filed at 04/26/2019 1500 Gross per 24 hour  Intake 790 ml  Output 400 ml  Net 390 ml   Filed Weights   04/09/19 0300 04/26/19 0500  Weight: 104.3 kg 93.9 kg    Examination:  General: No acute distress. Cardiovascular: RRR Lungs: unlabored Abdomen: Soft, nontender, nondistended  Neurological: Alert and oriented 3. Moves all extremities 4 . Cranial nerves II through XII grossly intact. Skin: Warm and dry. No rashes or lesions. Extremities: R BKA, LLE with pain to first MTP joint     Data Reviewed: I have personally reviewed following labs and imaging studies  CBC: Recent Labs  Lab 04/22/19 0022 04/23/19 0800 04/24/19 0920 04/25/19 0430 04/26/19 0600  WBC 16.9* 13.7* 8.6 8.4 8.4  NEUTROABS 13.9* 10.9* 6.4 5.8 6.4  HGB 15.6 16.1 15.9 15.7 14.2  HCT 46.6 49.7 49.1 48.3 43.5  MCV 94.9 97.6 96.3 96.8 95.8  PLT 54* 53* 56* 54* 56*   Basic Metabolic Panel: Recent Labs  Lab 04/22/19 0022 04/23/19 0303 04/24/19 0920 04/25/19 0430 04/26/19 0600  NA 137 138 138 139 137  K 4.7 4.0 4.3 4.2 4.1  CL 102 99 99 100 101  CO2 26 28 31 26 27   GLUCOSE 108* 110* 113* 114* 142*  BUN 19 22 17 19 17   CREATININE 0.93 0.86 0.96 0.97 0.87  CALCIUM 8.3* 8.4* 8.2* 8.4* 8.4*  MG  --   --   --   --  2.0   GFR: Estimated Creatinine Clearance: 93.2 mL/min (by C-G formula based on SCr of 0.87 mg/dL). Liver Function Tests: Recent Labs  Lab 04/22/19 0022 04/23/19 0303 04/24/19 0920 04/25/19 0430 04/26/19 0600  AST 37 37 39 37 33  ALT 39 46* 50* 51* 42  ALKPHOS 127* 107 83 79 65  BILITOT 1.3* 1.6* 1.8* 0.9 1.4*  PROT 6.2* 6.5 6.4* 6.3*  6.2*  ALBUMIN 3.1* 3.6 3.4* 3.3* 3.3*   No results for input(s): LIPASE, AMYLASE in the last 168 hours. No results for input(s): AMMONIA in the last 168 hours. Coagulation Profile: No results for input(s): INR, PROTIME in the last 168 hours. Cardiac Enzymes: No results for input(s): CKTOTAL, CKMB, CKMBINDEX, TROPONINI in the last 168 hours. BNP (last 3 results) No results for input(s): PROBNP in the last  8760 hours. HbA1C: No results for input(s): HGBA1C in the last 72 hours. CBG: Recent Labs  Lab 04/25/19 1136 04/25/19 1634 04/25/19 1951 04/26/19 0754 04/26/19 1207  GLUCAP 179* 128* 146* 121* 133*   Lipid Profile: No results for input(s): CHOL, HDL, LDLCALC, TRIG, CHOLHDL, LDLDIRECT in the last 72 hours. Thyroid Function Tests: No results for input(s): TSH, T4TOTAL, FREET4, T3FREE, THYROIDAB in the last 72 hours. Anemia Panel: Recent Labs    04/26/19 0600  FERRITIN 254   Sepsis Labs: No results for input(s): PROCALCITON, LATICACIDVEN in the last 168 hours.  No results found for this or any previous visit (from the past 240 hour(s)).       Radiology Studies: Dg Chest Port 1 View  Result Date: 04/25/2019 CLINICAL DATA:  Hypoxia in [name] patient who is COVID-19 positive. EXAM: PORTABLE CHEST 1 VIEW COMPARISON:  Single-view of the chest 04/18/2019 and 04/13/2019 FINDINGS: Extensive bilateral airspace disease persists. Aeration in the right lung base is mildly improved since the most recent exam. No pneumothorax or pleural effusion. Heart size is normal. No acute or focal bony abnormality. IMPRESSION: Multifocal pneumonia appears mildly improved in the right lung base. Electronically Signed   By: Inge Rise M.D.   On: 04/25/2019 11:28        Scheduled Meds: . aspirin  81 mg Oral Daily  . atorvastatin  40 mg Oral q1800  . chlorpheniramine-HYDROcodone  5 mL Oral Q12H  . famotidine  20 mg Oral BID  . fluticasone  2 spray Each Nare BID  . insulin aspart  0-9 Units  Subcutaneous TID WC  . Ipratropium-Albuterol  1 puff Inhalation Q6H  . predniSONE  40 mg Oral Q breakfast   Followed by  . [START ON 04/29/2019] predniSONE  30 mg Oral Q breakfast   Followed by  . [START ON 05/02/2019] predniSONE  20 mg Oral Q breakfast   Followed by  . [START ON 05/05/2019] predniSONE  10 mg Oral Q breakfast  . Rivaroxaban  15 mg Oral BID WC  . [START ON 05/03/2019] rivaroxaban  20 mg Oral Q supper  . sodium chloride flush  10-40 mL Intracatheter Q12H  . tamsulosin  0.4 mg Oral QPC supper  . ursodiol  300 mg Oral BID  . vitamin C  500 mg Oral Daily  . zinc sulfate  220 mg Oral Daily   Continuous Infusions:    LOS: 17 days    Time spent: over 30 min    Fayrene Helper, MD [facility] Pager AMION  If 7PM-7AM, please contact night-coverage [email or web address] Password [email or web address] 04/26/2019, 3:11 PM

## 2019-04-26 NOTE — Progress Notes (Signed)
MD paged. Pt c/o pain L great toe, noted swelling, redness and pain to touch. +2 pulse. Tylenol given. Awaiting callback from MD.

## 2019-04-26 NOTE — Progress Notes (Signed)
Covering MD, T. Opyd, contacted this nurse back about previous page. Stated that he would pass it on to day team to evaluate patient's foot unless he has another admission on this floor then he will assess patient. Will continue to monitor.

## 2019-04-27 ENCOUNTER — Inpatient Hospital Stay (HOSPITAL_COMMUNITY): Payer: PRIVATE HEALTH INSURANCE

## 2019-04-27 LAB — CBC WITH DIFFERENTIAL/PLATELET
Abs Immature Granulocytes: 0.06 10*3/uL (ref 0.00–0.07)
Basophils Absolute: 0 10*3/uL (ref 0.0–0.1)
Basophils Relative: 0 %
Eosinophils Absolute: 0 10*3/uL (ref 0.0–0.5)
Eosinophils Relative: 0 %
HCT: 44.6 % (ref 39.0–52.0)
Hemoglobin: 14.6 g/dL (ref 13.0–17.0)
Immature Granulocytes: 1 %
Lymphocytes Relative: 8 %
Lymphs Abs: 0.8 10*3/uL (ref 0.7–4.0)
MCH: 31.5 pg (ref 26.0–34.0)
MCHC: 32.7 g/dL (ref 30.0–36.0)
MCV: 96.3 fL (ref 80.0–100.0)
Monocytes Absolute: 0.4 10*3/uL (ref 0.1–1.0)
Monocytes Relative: 4 %
Neutro Abs: 8.7 10*3/uL — ABNORMAL HIGH (ref 1.7–7.7)
Neutrophils Relative %: 87 %
Platelets: 64 10*3/uL — ABNORMAL LOW (ref 150–400)
RBC: 4.63 MIL/uL (ref 4.22–5.81)
RDW: 14.6 % (ref 11.5–15.5)
WBC: 10.1 10*3/uL (ref 4.0–10.5)
nRBC: 0 % (ref 0.0–0.2)

## 2019-04-27 LAB — COMPREHENSIVE METABOLIC PANEL
ALT: 45 U/L — ABNORMAL HIGH (ref 0–44)
AST: 33 U/L (ref 15–41)
Albumin: 3.4 g/dL — ABNORMAL LOW (ref 3.5–5.0)
Alkaline Phosphatase: 66 U/L (ref 38–126)
Anion gap: 13 (ref 5–15)
BUN: 18 mg/dL (ref 8–23)
CO2: 27 mmol/L (ref 22–32)
Calcium: 8.2 mg/dL — ABNORMAL LOW (ref 8.9–10.3)
Chloride: 97 mmol/L — ABNORMAL LOW (ref 98–111)
Creatinine, Ser: 0.79 mg/dL (ref 0.61–1.24)
GFR calc Af Amer: >60 mL/min (ref >60)
GFR calc non Af Amer: >60 mL/min (ref >60)
Glucose, Bld: 118 mg/dL — ABNORMAL HIGH (ref 70–99)
Potassium: 5.1 mmol/L (ref 3.5–5.1)
Sodium: 137 mmol/L (ref 135–145)
Total Bilirubin: 1 mg/dL (ref 0.3–1.2)
Total Protein: 6.7 g/dL (ref 6.5–8.1)

## 2019-04-27 LAB — SEROTONIN RELEASE ASSAY (SRA)
SRA .2 IU/mL UFH Ser-aCnc: 1 % (ref 0–20)
SRA 100IU/mL UFH Ser-aCnc: 1 % (ref 0–20)

## 2019-04-27 LAB — FERRITIN: Ferritin: 221 ng/mL (ref 24–336)

## 2019-04-27 LAB — GLUCOSE, CAPILLARY
Glucose-Capillary: 116 mg/dL — ABNORMAL HIGH (ref 70–99)
Glucose-Capillary: 134 mg/dL — ABNORMAL HIGH (ref 70–99)
Glucose-Capillary: 243 mg/dL — ABNORMAL HIGH (ref 70–99)
Glucose-Capillary: 143 mg/dL — ABNORMAL HIGH (ref 70–99)

## 2019-04-27 LAB — D-DIMER, QUANTITATIVE: D-Dimer, Quant: 8.46 ug/mL-FEU — ABNORMAL HIGH (ref 0.00–0.50)

## 2019-04-27 LAB — MAGNESIUM: Magnesium: 2.2 mg/dL (ref 1.7–2.4)

## 2019-04-27 LAB — C-REACTIVE PROTEIN: CRP: 1.6 mg/dL — ABNORMAL HIGH (ref <1.0)

## 2019-04-27 LAB — BRAIN NATRIURETIC PEPTIDE: B Natriuretic Peptide: 59.9 pg/mL (ref 0.0–100.0)

## 2019-04-27 MED ORDER — FUROSEMIDE 10 MG/ML IJ SOLN
40.0000 mg | Freq: Once | INTRAMUSCULAR | Status: AC
  Administered 2019-04-27: 40 mg via INTRAVENOUS
  Filled 2019-04-27: qty 4

## 2019-04-27 NOTE — Plan of Care (Signed)
  Problem: Education: Goal: Knowledge of risk factors and measures for prevention of condition will improve 04/27/2019 0548 by Delcie Roch, Hasheem Voland, RN Outcome: Progressing 04/27/2019 0522 by Delcie Roch, Emani Morad, RN Outcome: Progressing   Problem: Coping: Goal: Psychosocial and spiritual needs will be supported 04/27/2019 0548 by Delcie Roch, Jamilett Ferrante, RN Outcome: Progressing 04/27/2019 0522 by Delcie Roch, Keira Bohlin, RN Outcome: Progressing   Problem: Respiratory: Goal: Will maintain a patent airway 04/27/2019 0548 by Delcie Roch, Adaia Matthies, RN Outcome: Progressing 04/27/2019 0522 by Delcie Roch, Arial Galligan, RN Outcome: Progressing Goal: Complications related to the disease process, condition or treatment will be avoided or minimized 04/27/2019 0548 by Delcie Roch, Danaija Eskridge, RN Outcome: Progressing 04/27/2019 0522 by Delcie Roch, Aleera Gilcrease, RN Outcome: Progressing   Problem: Education: Goal: Knowledge of General Education information will improve Description: Including pain rating scale, medication(s)/side effects and non-pharmacologic comfort measures 04/27/2019 0548 by Izell [place], RN Outcome: Progressing 04/27/2019 0522 by Delcie Roch, Tung Pustejovsky, RN Outcome: Progressing   Problem: Health Behavior/Discharge Planning: Goal: Ability to manage health-related needs will improve 04/27/2019 0548 by Delcie Roch, Cheyna Retana, RN Outcome: Progressing 04/27/2019 0522 by Delcie Roch, Daphene Chisholm, RN Outcome: Progressing   Problem: Clinical Measurements: Goal: Ability to maintain clinical measurements within normal limits will improve 04/27/2019 0548 by Delcie Roch, Leili Eskenazi, RN Outcome: Progressing 04/27/2019 0522 by Delcie Roch, Agnes Probert, RN Outcome: Progressing Goal: Will remain free from infection 04/27/2019 0548 by Delcie Roch, Dyan Creelman, RN Outcome: Progressing 04/27/2019 0522 by Delcie Roch, Murle Hellstrom, RN Outcome: Progressing Goal: Diagnostic test results will improve 04/27/2019 0548 by Delcie Roch, Kalasia Crafton, RN Outcome: Progressing 04/27/2019 0522 by Delcie Roch, Rochel Privett, RN Outcome: Progressing Goal: Respiratory  complications will improve 04/27/2019 0548 by Izell [employer], RN Outcome: Progressing 04/27/2019 0522 by Staley Lunz, RN Outcome: Progressing Goal: Cardiovascular complication will be avoided 04/27/2019 0548 by Izell [place], RN Outcome: Progressing 04/27/2019 0522 by Izell [place], RN Outcome: Progressing   Problem: Activity: Goal: Risk for activity intolerance will decrease 04/27/2019 0548 by Izell [place], RN Outcome: Progressing 04/27/2019 0522 by Delcie Roch, Miyoko Hashimi, RN Outcome: Progressing   Problem: Nutrition: Goal: Adequate nutrition will be maintained 04/27/2019 0548 by Delcie Roch, Isamar Nazir, RN Outcome: Progressing 04/27/2019 0522 by Delcie Roch, Kassandra Meriweather, RN Outcome: Progressing   Problem: Coping: Goal: Level of anxiety will decrease 04/27/2019 0548 by Izell [place], RN Outcome: Progressing 04/27/2019 0522 by Delcie Roch, Patricia [place], RN Outcome: Progressing   Problem: Elimination: Goal: Will not experience complications related to bowel motility 04/27/2019 0548 by Izell [place], RN Outcome: Progressing 04/27/2019 0522 by Delcie Roch, Lind Ausley, RN Outcome: Progressing Goal: Will not experience complications related to urinary retention 04/27/2019 0548 by Delcie Roch, Ladrea Holladay, RN Outcome: Progressing 04/27/2019 0522 by Delcie Roch, Senovia Gauer, RN Outcome: Progressing  Problem: Pain Managment: Goal: General experience of comfort will improve 04/27/2019 0548 by Delcie Roch, Kamoria Lucien, RN Outcome: Progressing 04/27/2019 0522 by Delcie Roch, Larya Charpentier, RN Outcome: Progressing   Problem: Safety: Goal: Ability to remain free from injury will improve 04/27/2019 0548 by Delcie Roch, Carrol Bondar, RN Outcome: Progressing 04/27/2019 0522 by Delcie Roch, Cicero Noy, RN Outcome: Progressing   Problem: Skin Integrity: Goal: Risk for impaired skin integrity will decrease 04/27/2019 0548 by Izell , RN Outcome: Progressing 04/27/2019 0522 by Izell , RN Outcome: Progressing

## 2019-04-27 NOTE — Progress Notes (Signed)
No acute events during this shift. VSS and WDL. Pt now on 8L HFNC.O2 90-94%. Pt has no complaints at this time. Pain score of L foot is now at 2. Pt sitting up in bed watching tv. Call bell within reach, encouraged to use call bell for assistance. Report to be given to oncoming shift. Continue to monitor

## 2019-04-27 NOTE — Plan of Care (Signed)

## 2019-04-27 NOTE — Progress Notes (Addendum)
PROGRESS NOTE    BLAS FIKES  [id number] DOB: 1955-06-28 DOA: 04/08/2019 PCP: [name], FNP   Brief Narrative:  [name] is [name] 63 y.o. male with [name] history of HTN, HLD, and right BKA who presented 11/15 with fever and cough persisting despite antibiotics and prednisone as an outpatient. He was found to be hypoxic with left upper lobe infiltrate and SARS-CoV-2 PCR positive. Remdesivir and steroids were given. Due to persistent hypoxemia, convalescent plasma and tocilizumab were administered and CTA chest performed. This demonstrated segmental and subsegmental pulmonary emboli within the RLL. Therapeutic anticoagulation has been given, though the patient's hypoxemia continues with slow recovery. Lasix has been given intermittently and antibiotics were started when CXR demonstrated [name] possible bacterial coinfection 11/25.   Assessment & Plan:   Principal Problem:   Pneumonia due to COVID-19 virus Active Problems:   Acute respiratory failure with hypoxia (HCC)   Essential hypertension   HLD (hyperlipidemia)   Community acquired pneumonia   Transaminitis   Hyperglycemia   AKI (acute kidney injury) ([place])   Bilateral pulmonary embolism (HCC)   Lobar pneumonia (HCC)   Acute hypoxic respiratory failure: Multifactorial, due to covid-19 pneumonia and acute PE as well as possible superimposed bacterial pneumonia.  - Hypoxemia improving slowly, but remains severe - O2 needs increased over past day, was on 10 last night - D dimer slightly improved today, continue to monitor - CRP has normalized while d-dimer remains >20. We have switched to po prednisone on 11/27. Will stop this now that it's been given >2 weeks. Has received tocilizumab and convalescent plasma on 11/17.  - I/O, daily weights - will trial dose of lasix (BP stable, creatinine wnl) - Continue ceftriaxone x7 days (11/25 - 12/1) - Continue therapeutic anticoagulation.  - Continue IS, FV, OOB, antitussives -  Airborne/contact precautions. - CIR recommended disposition, will need negative testing.  Needs further improvement in resp status. - CXR with multifocal pneumonia, improved in R lung base - CXR 12/4 stable  COVID-19 Labs  Recent Labs    04/25/19 0430 04/26/19 0600 04/27/19 0039  DDIMER 11.87* 9.88* 8.46*  FERRITIN  --  254 221  CRP  --  0.7 1.6*    Lab Results  Component Value Date   SARSCOV2NAA POSITIVE (Jessel Gettinger) 04/08/2019   SARSCOV2NAA Detected (Harsh Trulock) 04/04/2019   LLE DVT and Acute PE: continue anticoagulation as noted  Thrombocytopenia: Reports he may have had this episodically in the past but denies any liver dysfunction/hematologic consultations or personal or family history of this. At intermediate risk of HITT (4T's = 5). Would have expected improvement if this was HIT, though recovery can be delayed due to covid. Also Lamisha Roussell concern would be alloimmunity with convalescent plasma though this was given 11/17 and platelets noted to be normal 11/19. No evidence of hemolysis.  - Stopped heparin and started xarelto at the time when platelets were still wnl. Platelet count once found to have dropped has remained very stable just above 50k. No major bleeding noted and hgb is wnl/stable.  - Discussed the case with Dr. Irene Limbo, hematology, who recommended checking SRA (negative) and immature platelet fraction (elevated) in addition to HIT Ab (wnl). Platelet count is stable.  Will discuss again with Dr. Irene Limbo - notes most likely 2/2 viral ifnection. - Continue monitoring platelets. If bleeding develops or platelet count dips near to 30k, would suggest platelet transfusion. If refractory bleeding/worsened thrombocytopenia, would have to consider decreasing dose of xarelto vs. argatroban.   T2DM with steroid-induced  hyperglycemia: HbA1c 7.1%.  - Continue SSI, improving as steroids have been tapered  HLD:  - Continue statin  BPH:  - Continue flomax  Obesity: BMI 37. Noted.  Right hand apraxia  and edema: Appears stable. CT head without acute infarct noted initially. Not consistent with seizure. ?Critical illness neuropathy.  - Elevated RUE - Definite deficit noted. Would recheck CT once patient amenable, currently stating he's unsure he'd tolerate laying flat that long (stable today). - I think an MRI would be appropriate when he's doing better from O2 standpoint. - will discuss with neurology (agreed with above) - OT reconsulted to concentrate on this.  - If treating as Onia Shiflett stroke, he would get statin (which he's on) and antiplatelet. 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  - Control risk factors.  Suspected Gout Flare: L first MTP joint with pain, start steroid taper, follow uric acid level - improved  DVT prophylaxis: xarelto  Code Status: full  Family Communication: none at bedside - called son, no answer 12/2 - called wife 12/4 no answer Disposition Plan: pending further improvement  Consultants:   none  Procedures:  Echo IMPRESSIONS    1. Left ventricular ejection fraction, by visual estimation, is 60 to 65%. The left ventricle has normal function. There is mildly increased left ventricular hypertrophy.  2. Left ventricular diastolic parameters are consistent with Grade I diastolic dysfunction (impaired relaxation).  3. Global right ventricle has normal systolic function.The right ventricular size is normal.  4. Left atrial size was normal.  5. Right atrial size was normal.  6. The mitral valve is normal in structure. No evidence of mitral valve regurgitation. No evidence of mitral stenosis.  7. The tricuspid valve is normal in structure. Tricuspid valve regurgitation is mild.  8. The aortic valve is tricuspid. Aortic valve regurgitation is not visualized. No evidence of aortic valve sclerosis or stenosis.  9. The pulmonic valve was not well visualized. Pulmonic valve  regurgitation is not visualized. 10. Technically difficult; normal LV systolic function; mild LVH; grade1 diastolic dysfunction; mild TR with peak velocity of 3.8 m/s suggestive of at least moderate pulmonary hypertension; IVC not visualized and therefore PASP cannot be estimated.  LE [place] Summary: Right: There is no evidence of deep vein thrombosis in the lower extremity. Left: Findings consistent with acute deep vein thrombosis involving the left posterior tibial veins.  Antimicrobials:  Anti-infectives (From admission, onward)   Start     Dose/Rate Route Frequency Ordered Stop   04/19/19 1130  cefTRIAXone (ROCEPHIN) 2 g in sodium chloride 0.9 % 100 mL IVPB     2 g 200 mL/hr over 30 Minutes Intravenous Every 24 hours 04/19/19 0932 04/26/19 1129   04/18/19 1130  cefTRIAXone (ROCEPHIN) 1 g in sodium chloride 0.9 % 100 mL IVPB  Status:  Discontinued     1 g 200 mL/hr over 30 Minutes Intravenous Every 24 hours 04/18/19 1123 04/19/19 0932   04/10/19 1000  remdesivir 100 mg in sodium chloride 0.9 % 250 mL IVPB     100 mg 500 mL/hr over 30 Minutes Intravenous Every 24 hours 04/09/19 1321 04/13/19 1933   04/09/19 2200  cefTRIAXone (ROCEPHIN) 2 g in sodium chloride 0.9 % 100 mL IVPB  Status:  Discontinued     2 g 200 mL/hr over 30 Minutes Intravenous Every 24 hours 04/09/19 0400 04/10/19 0827   04/09/19 2200  azithromycin (ZITHROMAX) 500  mg in sodium chloride 0.9 % 250 mL IVPB  Status:  Discontinued     500 mg 250 mL/hr over 60 Minutes Intravenous Every 24 hours 04/09/19 0400 04/10/19 0827   04/09/19 1400  remdesivir 200 mg in sodium chloride 0.9 % 250 mL IVPB     200 mg 500 mL/hr over 30 Minutes Intravenous Once 04/09/19 1321 04/09/19 1642   04/09/19 0000  cefTRIAXone (ROCEPHIN) 1 g in sodium chloride 0.9 % 100 mL IVPB     1 g 200 mL/hr over 30 Minutes Intravenous  Once 04/08/19 2353 04/09/19 0115   04/09/19 0000  azithromycin (ZITHROMAX) 500 mg in sodium chloride 0.9 % 250 mL IVPB     500  mg 250 mL/hr over 60 Minutes Intravenous  Once 04/08/19 2353 04/09/19 0132     Subjective: No specific complaints today Still SOB.  Toe is better. Asks for recap of everything going on.  Objective: Vitals:   04/27/19 0059 04/27/19 0426 04/27/19 0500 04/27/19 0754  BP: 113/77 129/77  130/84  Pulse: 89 84  (!) 109  Resp: 18 20  18   Temp: 97.8 F (36.6 C) 97.6 F (36.4 C)  97.9 F (36.6 C)  TempSrc: Oral Oral  Oral  SpO2:  95%    Weight:   93.7 kg   Height:        Intake/Output Summary (Last 24 hours) at 04/27/2019 1458 Last data filed at 04/27/2019 1420 Gross per 24 hour  Intake 1140 ml  Output 1800 ml  Net -660 ml   Filed Weights   04/09/19 0300 04/26/19 0500 04/27/19 0500  Weight: 104.3 kg 93.9 kg 93.7 kg    Examination:  General: No acute distress. Cardiovascular: RRR Lungs: distant lung sounds, appears comfortable in chair, but on significant amount of O2 (10-12 L) Abdomen: Soft, nontender, nondistended Neurological: Alert and oriented 3. Moves all extremities 4. Cranial nerves II through XII grossly intact. Skin: Warm and dry. No rashes or lesions. Extremities: L 1st MTP with improved pain.  R BKA.      Data Reviewed: I have personally reviewed following labs and imaging studies  CBC: Recent Labs  Lab 04/23/19 0800 04/24/19 0920 04/25/19 0430 04/26/19 0600 04/27/19 0039  WBC 13.7* 8.6 8.4 8.4 10.1  NEUTROABS 10.9* 6.4 5.8 6.4 8.7*  HGB 16.1 15.9 15.7 14.2 14.6  HCT 49.7 49.1 48.3 43.5 44.6  MCV 97.6 96.3 96.8 95.8 96.3  PLT 53* 56* 54* 56* 64*   Basic Metabolic Panel: Recent Labs  Lab 04/23/19 0303 04/24/19 0920 04/25/19 0430 04/26/19 0600 04/27/19 0039  NA 138 138 139 137 137  K 4.0 4.3 4.2 4.1 5.1  CL 99 99 100 101 97*  CO2 28 31 26 27 27   GLUCOSE 110* 113* 114* 142* 118*  BUN 22 17 19 17 18   CREATININE 0.86 0.96 0.97 0.87 0.79  CALCIUM 8.4* 8.2* 8.4* 8.4* 8.2*  MG  --   --   --  2.0 2.2   GFR: Estimated Creatinine Clearance:  101.3 mL/min (by C-G formula based on SCr of 0.79 mg/dL). Liver Function Tests: Recent Labs  Lab 04/23/19 0303 04/24/19 0920 04/25/19 0430 04/26/19 0600 04/27/19 0039  AST 37 39 37 33 33  ALT 46* 50* 51* 42 45*  ALKPHOS 107 83 79 65 66  BILITOT 1.6* 1.8* 0.9 1.4* 1.0  PROT 6.5 6.4* 6.3* 6.2* 6.7  ALBUMIN 3.6 3.4* 3.3* 3.3* 3.4*   No results for input(s): LIPASE, AMYLASE in the last 168 hours.  No results for input(s): AMMONIA in the last 168 hours. Coagulation Profile: No results for input(s): INR, PROTIME in the last 168 hours. Cardiac Enzymes: No results for input(s): CKTOTAL, CKMB, CKMBINDEX, TROPONINI in the last 168 hours. BNP (last 3 results) No results for input(s): PROBNP in the last 8760 hours. HbA1C: No results for input(s): HGBA1C in the last 72 hours. CBG: Recent Labs  Lab 04/26/19 1207 04/26/19 1538 04/26/19 2011 04/27/19 0750 04/27/19 1214  GLUCAP 133* 146* 246* 116* 134*   Lipid Profile: No results for input(s): CHOL, HDL, LDLCALC, TRIG, CHOLHDL, LDLDIRECT in the last 72 hours. Thyroid Function Tests: No results for input(s): TSH, T4TOTAL, FREET4, T3FREE, THYROIDAB in the last 72 hours. Anemia Panel: Recent Labs    04/26/19 0600 04/27/19 0039  FERRITIN 254 221   Sepsis Labs: No results for input(s): PROCALCITON, LATICACIDVEN in the last 168 hours.  No results found for this or any previous visit (from the past 240 hour(s)).       Radiology Studies: Dg Chest Port 1 View  Result Date: 04/27/2019 CLINICAL DATA:  Shortness of breath. COVID-19. EXAM: PORTABLE CHEST 1 VIEW COMPARISON:  Chest x-rays dated 04/25/2019 and 05/18/2019 FINDINGS: Bilateral diffuse pulmonary infiltrates persist with no significant change since the prior exam. Heart size and vascularity are normal. No effusions. No bone abnormality. IMPRESSION: No change in the appearance of the chest since the prior study. Electronically Signed   By: Lorriane Shire M.D.   On: 04/27/2019  09:27        Scheduled Meds: . aspirin  81 mg Oral Daily  . atorvastatin  40 mg Oral q1800  . chlorpheniramine-HYDROcodone  5 mL Oral Q12H  . famotidine  20 mg Oral BID  . fluticasone  2 spray Each Nare BID  . furosemide  40 mg Intravenous Once  . insulin aspart  0-9 Units Subcutaneous TID WC  . Ipratropium-Albuterol  1 puff Inhalation Q6H  . predniSONE  40 mg Oral Q breakfast   Followed by  . [START ON 04/29/2019] predniSONE  30 mg Oral Q breakfast   Followed by  . [START ON 05/02/2019] predniSONE  20 mg Oral Q breakfast   Followed by  . [START ON 05/05/2019] predniSONE  10 mg Oral Q breakfast  . Rivaroxaban  15 mg Oral BID WC  . [START ON 05/03/2019] rivaroxaban  20 mg Oral Q supper  . sodium chloride flush  10-40 mL Intracatheter Q12H  . tamsulosin  0.4 mg Oral QPC supper  . ursodiol  300 mg Oral BID  . vitamin C  500 mg Oral Daily  . zinc sulfate  220 mg Oral Daily   Continuous Infusions:    LOS: 18 days    Time spent: over 30 min    Fayrene Helper, MD [facility] Pager AMION  If 7PM-7AM, please contact night-coverage [email or web address] Password [email or web address] 04/27/2019, 2:58 PM

## 2019-04-28 LAB — COMPREHENSIVE METABOLIC PANEL
ALT: 51 U/L — ABNORMAL HIGH (ref 0–44)
AST: 38 U/L (ref 15–41)
Albumin: 3.4 g/dL — ABNORMAL LOW (ref 3.5–5.0)
Alkaline Phosphatase: 70 U/L (ref 38–126)
Anion gap: 12 (ref 5–15)
BUN: 19 mg/dL (ref 8–23)
CO2: 30 mmol/L (ref 22–32)
Calcium: 8.9 mg/dL (ref 8.9–10.3)
Chloride: 96 mmol/L — ABNORMAL LOW (ref 98–111)
Creatinine, Ser: 0.84 mg/dL (ref 0.61–1.24)
GFR calc Af Amer: >60 mL/min (ref >60)
GFR calc non Af Amer: >60 mL/min (ref >60)
Glucose, Bld: 114 mg/dL — ABNORMAL HIGH (ref 70–99)
Potassium: 4.4 mmol/L (ref 3.5–5.1)
Sodium: 138 mmol/L (ref 135–145)
Total Bilirubin: 1.2 mg/dL (ref 0.3–1.2)
Total Protein: 6.9 g/dL (ref 6.5–8.1)

## 2019-04-28 LAB — CBC WITH DIFFERENTIAL/PLATELET
Abs Immature Granulocytes: 0.05 10*3/uL (ref 0.00–0.07)
Basophils Absolute: 0.1 10*3/uL (ref 0.0–0.1)
Basophils Relative: 1 %
Eosinophils Absolute: 0.1 10*3/uL (ref 0.0–0.5)
Eosinophils Relative: 1 %
HCT: 46 % (ref 39.0–52.0)
Hemoglobin: 15.2 g/dL (ref 13.0–17.0)
Immature Granulocytes: 1 %
Lymphocytes Relative: 16 %
Lymphs Abs: 1.6 10*3/uL (ref 0.7–4.0)
MCH: 31.5 pg (ref 26.0–34.0)
MCHC: 33 g/dL (ref 30.0–36.0)
MCV: 95.2 fL (ref 80.0–100.0)
Monocytes Absolute: 1.2 10*3/uL — ABNORMAL HIGH (ref 0.1–1.0)
Monocytes Relative: 11 %
Neutro Abs: 7.2 10*3/uL (ref 1.7–7.7)
Neutrophils Relative %: 70 %
Platelets: 79 10*3/uL — ABNORMAL LOW (ref 150–400)
RBC: 4.83 MIL/uL (ref 4.22–5.81)
RDW: 14.6 % (ref 11.5–15.5)
WBC: 10.3 10*3/uL (ref 4.0–10.5)
nRBC: 0 % (ref 0.0–0.2)

## 2019-04-28 LAB — MAGNESIUM: Magnesium: 2.1 mg/dL (ref 1.7–2.4)

## 2019-04-28 LAB — GLUCOSE, CAPILLARY
Glucose-Capillary: 122 mg/dL — ABNORMAL HIGH (ref 70–99)
Glucose-Capillary: 185 mg/dL — ABNORMAL HIGH (ref 70–99)
Glucose-Capillary: 181 mg/dL — ABNORMAL HIGH (ref 70–99)

## 2019-04-28 LAB — D-DIMER, QUANTITATIVE: D-Dimer, Quant: 6.07 ug/mL-FEU — ABNORMAL HIGH (ref 0.00–0.50)

## 2019-04-28 LAB — FERRITIN: Ferritin: 235 ng/mL (ref 24–336)

## 2019-04-28 LAB — C-REACTIVE PROTEIN: CRP: 1.2 mg/dL — ABNORMAL HIGH (ref <1.0)

## 2019-04-28 MED ORDER — FUROSEMIDE 10 MG/ML IJ SOLN
40.0000 mg | Freq: Four times a day (QID) | INTRAMUSCULAR | Status: AC
  Administered 2019-04-28 (×2): 40 mg via INTRAVENOUS
  Filled 2019-04-28 (×2): qty 4

## 2019-04-28 NOTE — Plan of Care (Signed)

## 2019-04-28 NOTE — Progress Notes (Signed)
Patient maintains good saturations on 4L HFNC.  Earlier this morning he got up form bed to go to his chair.  Upon exertion he desaturated to the mid 60's and I placed him on non-rebreathe at 16L until his oxygen saturation returned.  I then placed him on 4L HFNC.  Patient has very good discipline when requiring his conscious breathing to bring his saturations up.  He is saturating between 88-94%.  His heart rate has fluctuated between sinus rhythm and sinus tach from 80's to 110's.  This is due to his oxygen requirements.  He shows no signs of distress.  Vitals WDL.  AOx4.  Patient is in good spirits.

## 2019-04-28 NOTE — Progress Notes (Signed)
PROGRESS NOTE    [name]  [id number] DOB: 11-18-1955 DOA: 04/08/2019 PCP: [name], FNP   Brief Narrative:  [name] is a 63 y.o. male with a history of HTN, HLD, and right BKA who presented 11/15 with fever and cough persisting despite antibiotics and prednisone as an outpatient. He was found to be hypoxic with left upper lobe infiltrate and SARS-CoV-2 PCR positive. Remdesivir and steroids were given. Due to persistent hypoxemia, convalescent plasma and tocilizumab were administered and CTA chest performed. This demonstrated segmental and subsegmental pulmonary emboli within the RLL. Therapeutic anticoagulation has been given, though the patient's hypoxemia continues with slow recovery. Lasix has been given intermittently and antibiotics were started when CXR demonstrated a possible bacterial coinfection 11/25.   Assessment & Plan:   Principal Problem:   Pneumonia due to COVID-19 virus Active Problems:   Acute respiratory failure with hypoxia (HCC)   Essential hypertension   HLD (hyperlipidemia)   Community acquired pneumonia   Transaminitis   Hyperglycemia   AKI (acute kidney injury) ([place])   Bilateral pulmonary embolism (HCC)   Lobar pneumonia (HCC)   Acute hypoxic respiratory failure: Multifactorial, due to covid-19 pneumonia and acute PE as well as possible superimposed bacterial pneumonia.  - Hypoxemia improving slowly, but remains severe - fluctuating over past day - recently on 4 L - D dimer slightly improved today, continue to monitor - CRP has normalized while d-dimer remains >20. We have switched to po prednisone on 11/27. Will stop this now that it's been given >2 weeks. Has received tocilizumab and convalescent plasma on 11/17.  - I/O, daily weights - continue lasix as tolerated (BP stable, creatinine wnl) - Continue ceftriaxone x7 days (11/25 - 12/1) - Continue therapeutic anticoagulation.  - Continue IS, FV, OOB, antitussives - Airborne/contact  precautions. - CIR recommended disposition, will need negative testing.  Needs further improvement in resp status. - CXR with multifocal pneumonia, improved in R lung base - CXR 12/4 stable  COVID-19 Labs  Recent Labs    04/26/19 0600 04/27/19 0039 04/28/19 0141  DDIMER 9.88* 8.46* 6.07*  FERRITIN 254 221 235  CRP 0.7 1.6* 1.2*    Lab Results  Component Value Date   SARSCOV2NAA POSITIVE (A) 04/08/2019   SARSCOV2NAA Detected (A) 04/04/2019   LLE DVT and Acute PE: continue anticoagulation as noted - xarelto  Thrombocytopenia: Reports he may have had this episodically in the past but denies any liver dysfunction/hematologic consultations or personal or family history of this. At intermediate risk of HITT (4T's = 5). Would have expected improvement if this was HIT, though recovery can be delayed due to covid. Also a concern would be alloimmunity with convalescent plasma though this was given 11/17 and platelets noted to be normal 11/19. No evidence of hemolysis.  - Stopped heparin and started xarelto at the time when platelets were still wnl. Platelet count once found to have dropped has remained very stable just above 50k. No major bleeding noted and hgb is wnl/stable.  - Discussed the case with Dr. Irene Limbo, hematology, who recommended checking SRA (negative) and immature platelet fraction (elevated) in addition to HIT Ab (wnl). Platelet count is stable.  Will discuss again with Dr. Irene Limbo - notes most likely 2/2 viral ifnection.  Improving. - Continue monitoring platelets. If bleeding develops or platelet count dips near to 30k, would suggest platelet transfusion. If refractory bleeding/worsened thrombocytopenia, would have to consider decreasing dose of xarelto vs. argatroban.   T2DM with steroid-induced hyperglycemia: HbA1c 7.1%.  -  Continue SSI, improving as steroids have been tapered  HLD:  - Continue statin  BPH:  - Continue flomax  Obesity: BMI 37. Noted.  Right hand  apraxia and edema: Appears stable. CT head without acute infarct noted initially. Not consistent with seizure. ?Critical illness neuropathy.  - Elevated RUE - Definite deficit noted. Would recheck CT once patient amenable, currently stating he's unsure he'd tolerate laying flat that long (stable today). - I think an MRI would be appropriate when he's doing better from O2 standpoint. - will discuss with neurology (agreed with above) - OT reconsulted to concentrate on this.  - If treating as a stroke, he would get statin (which he's on) and antiplatelet. 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  - Control risk factors.  Suspected Gout Flare: L first MTP joint with pain, start steroid taper, follow uric acid level - improved  DVT prophylaxis: xarelto  Code Status: full  Family Communication: none at bedside - called son, no answer 12/2 - called wife 12/4 no answer Disposition Plan: pending further improvement  Consultants:   none  Procedures:  Echo IMPRESSIONS    1. Left ventricular ejection fraction, by visual estimation, is 60 to 65%. The left ventricle has normal function. There is mildly increased left ventricular hypertrophy.  2. Left ventricular diastolic parameters are consistent with Grade I diastolic dysfunction (impaired relaxation).  3. Global right ventricle has normal systolic function.The right ventricular size is normal.  4. Left atrial size was normal.  5. Right atrial size was normal.  6. The mitral valve is normal in structure. No evidence of mitral valve regurgitation. No evidence of mitral stenosis.  7. The tricuspid valve is normal in structure. Tricuspid valve regurgitation is mild.  8. The aortic valve is tricuspid. Aortic valve regurgitation is not visualized. No evidence of aortic valve sclerosis or stenosis.  9. The pulmonic valve was not well visualized. Pulmonic  valve regurgitation is not visualized. 10. Technically difficult; normal LV systolic function; mild LVH; grade1 diastolic dysfunction; mild TR with peak velocity of 3.8 m/s suggestive of at least moderate pulmonary hypertension; IVC not visualized and therefore PASP cannot be estimated.  LE [place] Summary: Right: There is no evidence of deep vein thrombosis in the lower extremity. Left: Findings consistent with acute deep vein thrombosis involving the left posterior tibial veins.  Antimicrobials:  Anti-infectives (From admission, onward)   Start     Dose/Rate Route Frequency Ordered Stop   04/19/19 1130  cefTRIAXone (ROCEPHIN) 2 g in sodium chloride 0.9 % 100 mL IVPB     2 g 200 mL/hr over 30 Minutes Intravenous Every 24 hours 04/19/19 0932 04/26/19 1129   04/18/19 1130  cefTRIAXone (ROCEPHIN) 1 g in sodium chloride 0.9 % 100 mL IVPB  Status:  Discontinued     1 g 200 mL/hr over 30 Minutes Intravenous Every 24 hours 04/18/19 1123 04/19/19 0932   04/10/19 1000  remdesivir 100 mg in sodium chloride 0.9 % 250 mL IVPB     100 mg 500 mL/hr over 30 Minutes Intravenous Every 24 hours 04/09/19 1321 04/13/19 1933   04/09/19 2200  cefTRIAXone (ROCEPHIN) 2 g in sodium chloride 0.9 % 100 mL IVPB  Status:  Discontinued     2 g 200 mL/hr over 30 Minutes Intravenous Every 24 hours 04/09/19 0400 04/10/19 0827   04/09/19 2200  azithromycin (ZITHROMAX) 500 mg in sodium chloride 0.9 %  250 mL IVPB  Status:  Discontinued     500 mg 250 mL/hr over 60 Minutes Intravenous Every 24 hours 04/09/19 0400 04/10/19 0827   04/09/19 1400  remdesivir 200 mg in sodium chloride 0.9 % 250 mL IVPB     200 mg 500 mL/hr over 30 Minutes Intravenous Once 04/09/19 1321 04/09/19 1642   04/09/19 0000  cefTRIAXone (ROCEPHIN) 1 g in sodium chloride 0.9 % 100 mL IVPB     1 g 200 mL/hr over 30 Minutes Intravenous  Once 04/08/19 2353 04/09/19 0115   04/09/19 0000  azithromycin (ZITHROMAX) 500 mg in sodium chloride 0.9 % 250 mL IVPB      500 mg 250 mL/hr over 60 Minutes Intravenous  Once 04/08/19 2353 04/09/19 0132     Subjective: No complaints today Frustrated with slow progress Thinks maybe lasix helped  Objective: Vitals:   04/28/19 0839 04/28/19 1100 04/28/19 1133 04/28/19 1225  BP: 107/79  119/78   Pulse: 96 (!) 107 100 (!) 103  Resp:      Temp: 97.7 F (36.5 C)     TempSrc: Oral     SpO2: 94% 94% 98% 97%  Weight:      Height:        Intake/Output Summary (Last 24 hours) at 04/28/2019 1636 Last data filed at 04/28/2019 1227 Gross per 24 hour  Intake 10 ml  Output 2200 ml  Net -2190 ml   Filed Weights   04/26/19 0500 04/27/19 0500 04/28/19 0500  Weight: 93.9 kg 93.7 kg 90.2 kg    Examination:  General: No acute distress. Cardiovascular: RRR Lungs: unlabored Abdomen: Soft, nontender, nondistended Neurological: Alert and oriented 3. Moves all extremities 4. Cranial nerves II through XII grossly intact. Skin: Warm and dry. No rashes or lesions. Extremities: No clubbing or cyanosis. No edema.   Data Reviewed: I have personally reviewed following labs and imaging studies  CBC: Recent Labs  Lab 04/24/19 0920 04/25/19 0430 04/26/19 0600 04/27/19 0039 04/28/19 0141  WBC 8.6 8.4 8.4 10.1 10.3  NEUTROABS 6.4 5.8 6.4 8.7* 7.2  HGB 15.9 15.7 14.2 14.6 15.2  HCT 49.1 48.3 43.5 44.6 46.0  MCV 96.3 96.8 95.8 96.3 95.2  PLT 56* 54* 56* 64* 79*   Basic Metabolic Panel: Recent Labs  Lab 04/24/19 0920 04/25/19 0430 04/26/19 0600 04/27/19 0039 04/28/19 0141  NA 138 139 137 137 138  K 4.3 4.2 4.1 5.1 4.4  CL 99 100 101 97* 96*  CO2 31 26 27 27 30   GLUCOSE 113* 114* 142* 118* 114*  BUN 17 19 17 18 19   CREATININE 0.96 0.97 0.87 0.79 0.84  CALCIUM 8.2* 8.4* 8.4* 8.2* 8.9  MG  --   --  2.0 2.2 2.1   GFR: Estimated Creatinine Clearance: 94.7 mL/min (by C-G formula based on SCr of 0.84 mg/dL). Liver Function Tests: Recent Labs  Lab 04/24/19 0920 04/25/19 0430 04/26/19 0600 04/27/19  0039 04/28/19 0141  AST 39 37 33 33 38  ALT 50* 51* 42 45* 51*  ALKPHOS 83 79 65 66 70  BILITOT 1.8* 0.9 1.4* 1.0 1.2  PROT 6.4* 6.3* 6.2* 6.7 6.9  ALBUMIN 3.4* 3.3* 3.3* 3.4* 3.4*   No results for input(s): LIPASE, AMYLASE in the last 168 hours. No results for input(s): AMMONIA in the last 168 hours. Coagulation Profile: No results for input(s): INR, PROTIME in the last 168 hours. Cardiac Enzymes: No results for input(s): CKTOTAL, CKMB, CKMBINDEX, TROPONINI in the last 168 hours. BNP (last  3 results) No results for input(s): PROBNP in the last 8760 hours. HbA1C: No results for input(s): HGBA1C in the last 72 hours. CBG: Recent Labs  Lab 04/27/19 1214 04/27/19 1611 04/27/19 2023 04/28/19 0842 04/28/19 1120  GLUCAP 134* 243* 143* 122* 185*   Lipid Profile: No results for input(s): CHOL, HDL, LDLCALC, TRIG, CHOLHDL, LDLDIRECT in the last 72 hours. Thyroid Function Tests: No results for input(s): TSH, T4TOTAL, FREET4, T3FREE, THYROIDAB in the last 72 hours. Anemia Panel: Recent Labs    04/27/19 0039 04/28/19 0141  FERRITIN 221 235   Sepsis Labs: No results for input(s): PROCALCITON, LATICACIDVEN in the last 168 hours.  No results found for this or any previous visit (from the past 240 hour(s)).       Radiology Studies: Dg Chest Port 1 View  Result Date: 04/27/2019 CLINICAL DATA:  Shortness of breath. COVID-19. EXAM: PORTABLE CHEST 1 VIEW COMPARISON:  Chest x-rays dated 04/25/2019 and 05/18/2019 FINDINGS: Bilateral diffuse pulmonary infiltrates persist with no significant change since the prior exam. Heart size and vascularity are normal. No effusions. No bone abnormality. IMPRESSION: No change in the appearance of the chest since the prior study. Electronically Signed   By: Lorriane Shire M.D.   On: 04/27/2019 09:27        Scheduled Meds: . aspirin  81 mg Oral Daily  . atorvastatin  40 mg Oral q1800  . chlorpheniramine-HYDROcodone  5 mL Oral Q12H  .  famotidine  20 mg Oral BID  . fluticasone  2 spray Each Nare BID  . insulin aspart  0-9 Units Subcutaneous TID WC  . Ipratropium-Albuterol  1 puff Inhalation Q6H  . [START ON 04/29/2019] predniSONE  30 mg Oral Q breakfast   Followed by  . [START ON 05/02/2019] predniSONE  20 mg Oral Q breakfast   Followed by  . [START ON 05/05/2019] predniSONE  10 mg Oral Q breakfast  . Rivaroxaban  15 mg Oral BID WC  . [START ON 05/03/2019] rivaroxaban  20 mg Oral Q supper  . sodium chloride flush  10-40 mL Intracatheter Q12H  . tamsulosin  0.4 mg Oral QPC supper  . ursodiol  300 mg Oral BID  . vitamin C  500 mg Oral Daily  . zinc sulfate  220 mg Oral Daily   Continuous Infusions:    LOS: 19 days    Time spent: over 30 min    Fayrene Helper, MD [facility] Pager AMION  If 7PM-7AM, please contact night-coverage [email or web address] Password [facility] 04/28/2019, 4:36 PM

## 2019-04-29 LAB — CBC WITH DIFFERENTIAL/PLATELET
Abs Immature Granulocytes: 0.05 10*3/uL (ref 0.00–0.07)
Basophils Absolute: 0 10*3/uL (ref 0.0–0.1)
Basophils Relative: 0 %
Eosinophils Absolute: 0.1 10*3/uL (ref 0.0–0.5)
Eosinophils Relative: 1 %
HCT: 44.8 % (ref 39.0–52.0)
Hemoglobin: 14.8 g/dL (ref 13.0–17.0)
Immature Granulocytes: 1 %
Lymphocytes Relative: 13 %
Lymphs Abs: 1.4 10*3/uL (ref 0.7–4.0)
MCH: 31 pg (ref 26.0–34.0)
MCHC: 33 g/dL (ref 30.0–36.0)
MCV: 93.9 fL (ref 80.0–100.0)
Monocytes Absolute: 1.4 10*3/uL — ABNORMAL HIGH (ref 0.1–1.0)
Monocytes Relative: 13 %
Neutro Abs: 7.8 10*3/uL — ABNORMAL HIGH (ref 1.7–7.7)
Neutrophils Relative %: 72 %
Platelets: 100 10*3/uL — ABNORMAL LOW (ref 150–400)
RBC: 4.77 MIL/uL (ref 4.22–5.81)
RDW: 14.5 % (ref 11.5–15.5)
WBC: 10.8 10*3/uL — ABNORMAL HIGH (ref 4.0–10.5)
nRBC: 0 % (ref 0.0–0.2)

## 2019-04-29 LAB — COMPREHENSIVE METABOLIC PANEL
ALT: 64 U/L — ABNORMAL HIGH (ref 0–44)
AST: 40 U/L (ref 15–41)
Albumin: 3.7 g/dL (ref 3.5–5.0)
Alkaline Phosphatase: 72 U/L (ref 38–126)
Anion gap: 13 (ref 5–15)
BUN: 22 mg/dL (ref 8–23)
CO2: 33 mmol/L — ABNORMAL HIGH (ref 22–32)
Calcium: 8.8 mg/dL — ABNORMAL LOW (ref 8.9–10.3)
Chloride: 91 mmol/L — ABNORMAL LOW (ref 98–111)
Creatinine, Ser: 1.05 mg/dL (ref 0.61–1.24)
GFR calc Af Amer: >60 mL/min (ref >60)
GFR calc non Af Amer: >60 mL/min (ref >60)
Glucose, Bld: 115 mg/dL — ABNORMAL HIGH (ref 70–99)
Potassium: 4.2 mmol/L (ref 3.5–5.1)
Sodium: 137 mmol/L (ref 135–145)
Total Bilirubin: 1.5 mg/dL — ABNORMAL HIGH (ref 0.3–1.2)
Total Protein: 7.1 g/dL (ref 6.5–8.1)

## 2019-04-29 LAB — C-REACTIVE PROTEIN: CRP: 1.8 mg/dL — ABNORMAL HIGH (ref <1.0)

## 2019-04-29 LAB — FERRITIN: Ferritin: 253 ng/mL (ref 24–336)

## 2019-04-29 LAB — GLUCOSE, CAPILLARY
Glucose-Capillary: 114 mg/dL — ABNORMAL HIGH (ref 70–99)
Glucose-Capillary: 167 mg/dL — ABNORMAL HIGH (ref 70–99)
Glucose-Capillary: 178 mg/dL — ABNORMAL HIGH (ref 70–99)
Glucose-Capillary: 166 mg/dL — ABNORMAL HIGH (ref 70–99)

## 2019-04-29 LAB — MAGNESIUM: Magnesium: 2.2 mg/dL (ref 1.7–2.4)

## 2019-04-29 LAB — D-DIMER, QUANTITATIVE: D-Dimer, Quant: 4.65 ug/mL-FEU — ABNORMAL HIGH (ref 0.00–0.50)

## 2019-04-29 NOTE — Progress Notes (Signed)
Patient transferred from chair to [facility] then to bed. Patient had increase work of breathing with movement, but recovered well with focused breathing. During increased work of breathing, oxygen turned up to 5L/min, once recovered oxygen turned back down to 4L/min. Patient placed in a position of stated comfort, will continue to monitor and treat per MD order.

## 2019-04-29 NOTE — Progress Notes (Signed)
Attempted to contact pt's spouse at number listed in chart, for an update. No answer and no voicemail available. Pt aware.

## 2019-04-29 NOTE — Progress Notes (Addendum)
PROGRESS NOTE    [name]  [id number] DOB: 1956/05/04 DOA: 04/08/2019 PCP: [name] Brine, FNP   Brief Narrative:  [name] is [name] 63 y.o. male with [name] history of HTN, HLD, and right BKA who presented 11/15 with fever and cough persisting despite antibiotics and prednisone as an outpatient. He was found to be hypoxic with left upper lobe infiltrate and SARS-CoV-2 PCR positive. Remdesivir and steroids were given. Due to persistent hypoxemia, convalescent plasma and tocilizumab were administered and CTA chest performed. This demonstrated segmental and subsegmental pulmonary emboli within the RLL. Therapeutic anticoagulation has been given, though the patient's hypoxemia continues with slow recovery. Lasix has been given intermittently and antibiotics were started when CXR demonstrated [name] possible bacterial coinfection 11/25.   Assessment & Plan:   Principal Problem:   Pneumonia due to COVID-19 virus Active Problems:   Acute respiratory failure with hypoxia (HCC)   Essential hypertension   HLD (hyperlipidemia)   Community acquired pneumonia   Transaminitis   Hyperglycemia   AKI (acute kidney injury) ([place])   Bilateral pulmonary embolism (HCC)   Lobar pneumonia (HCC)   Acute hypoxic respiratory failure: Multifactorial, due to covid-19 pneumonia and acute PE as well as possible superimposed bacterial pneumonia.  - Hypoxemia improving slowly, but remains severe - fluctuating over past day - currently on 4-5 L [place] - D dimer slightly improved today, continue to monitor - CRP has normalized while d-dimer remains >20. We have switched to po prednisone on 11/27. Will stop this now that it's been given >2 weeks. Has received tocilizumab and convalescent plasma on 11/17.  - I/O, daily weights - hold lasix today - mild alkalosis, tachy - will follow - Continue ceftriaxone x7 days (11/25 - 12/1) - Continue therapeutic anticoagulation.  - Continue IS, FV, OOB, antitussives -  Airborne/contact precautions. - CIR recommended disposition, will need negative testing.  Needs further improvement in resp status. - CXR with multifocal pneumonia, improved in R lung base - CXR 12/4 stable  COVID-19 Labs  Recent Labs    04/27/19 0039 04/28/19 0141 04/29/19 0322  DDIMER 8.46* 6.07* 4.65*  FERRITIN 221 235 253  CRP 1.6* 1.2* 1.8*    Lab Results  Component Value Date   SARSCOV2NAA POSITIVE ([name]) 04/08/2019   SARSCOV2NAA Detected ([name]) 04/04/2019   LLE DVT and Acute PE: continue anticoagulation as noted - xarelto  Thrombocytopenia: Reports he may have had this episodically in the past but denies any liver dysfunction/hematologic consultations or personal or family history of this. At intermediate risk of HITT (4T's = 5). Would have expected improvement if this was HIT, though recovery can be delayed due to covid. Also [name] concern would be alloimmunity with convalescent plasma though this was given 11/17 and platelets noted to be normal 11/19. No evidence of hemolysis.  - Stopped heparin and started xarelto at the time when platelets were still wnl. Platelet count once found to have dropped has remained very stable just above 50k. No major bleeding noted and hgb is wnl/stable.  - Discussed the case with Dr. Irene Limbo, hematology, who recommended checking SRA (negative) and immature platelet fraction (elevated) in addition to HIT Ab (wnl). Platelet count is stable.  Will discuss again with Dr. Irene Limbo - notes most likely 2/2 viral ifnection.  Improving. - Continue monitoring platelets. If bleeding develops or platelet count dips near to 30k, would suggest platelet transfusion. If refractory bleeding/worsened thrombocytopenia, would have to consider decreasing dose of xarelto vs. argatroban.   T2DM with steroid-induced  hyperglycemia: HbA1c 7.1%.  - Continue SSI, improving as steroids have been tapered  HLD:  - Continue statin  BPH:  - Continue flomax  Obesity: BMI 37. Noted.   Right hand apraxia and edema: Appears stable. CT head without acute infarct noted initially. Not consistent with seizure. ?Critical illness neuropathy.  - Elevated RUE - Definite deficit noted. Would recheck CT once patient amenable, currently stating he's unsure he'd tolerate laying flat that long (stable today). - I think an MRI would be appropriate when he's doing better from O2 standpoint. - will discuss with neurology (agreed with above) - OT reconsulted to concentrate on this.  - If treating as [name] stroke, he would get statin (which he's on) and antiplatelet. 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  - Control risk factors.  Suspected Gout Flare: L first MTP joint with pain, start steroid taper, follow uric acid level (wnl) - improved  DVT prophylaxis: xarelto  Code Status: full  Family Communication: none at bedside - called son, no answer 12/2 - discussed with wife 12/6 Disposition Plan: pending further improvement  Consultants:   none  Procedures:  Echo IMPRESSIONS    1. Left ventricular ejection fraction, by visual estimation, is 60 to 65%. The left ventricle has normal function. There is mildly increased left ventricular hypertrophy.  2. Left ventricular diastolic parameters are consistent with Grade I diastolic dysfunction (impaired relaxation).  3. Global right ventricle has normal systolic function.The right ventricular size is normal.  4. Left atrial size was normal.  5. Right atrial size was normal.  6. The mitral valve is normal in structure. No evidence of mitral valve regurgitation. No evidence of mitral stenosis.  7. The tricuspid valve is normal in structure. Tricuspid valve regurgitation is mild.  8. The aortic valve is tricuspid. Aortic valve regurgitation is not visualized. No evidence of aortic valve sclerosis or stenosis.  9. The pulmonic valve was not well  visualized. Pulmonic valve regurgitation is not visualized. 10. Technically difficult; normal LV systolic function; mild LVH; grade1 diastolic dysfunction; mild TR with peak velocity of 3.8 m/s suggestive of at least moderate pulmonary hypertension; IVC not visualized and therefore PASP cannot be estimated.  LE [place] Summary: Right: There is no evidence of deep vein thrombosis in the lower extremity. Left: Findings consistent with acute deep vein thrombosis involving the left posterior tibial veins.  Antimicrobials:  Anti-infectives (From admission, onward)   Start     Dose/Rate Route Frequency Ordered Stop   04/19/19 1130  cefTRIAXone (ROCEPHIN) 2 g in sodium chloride 0.9 % 100 mL IVPB     2 g 200 mL/hr over 30 Minutes Intravenous Every 24 hours 04/19/19 0932 04/26/19 1129   04/18/19 1130  cefTRIAXone (ROCEPHIN) 1 g in sodium chloride 0.9 % 100 mL IVPB  Status:  Discontinued     1 g 200 mL/hr over 30 Minutes Intravenous Every 24 hours 04/18/19 1123 04/19/19 0932   04/10/19 1000  remdesivir 100 mg in sodium chloride 0.9 % 250 mL IVPB     100 mg 500 mL/hr over 30 Minutes Intravenous Every 24 hours 04/09/19 1321 04/13/19 1933   04/09/19 2200  cefTRIAXone (ROCEPHIN) 2 g in sodium chloride 0.9 % 100 mL IVPB  Status:  Discontinued     2 g 200 mL/hr over 30 Minutes Intravenous Every 24 hours 04/09/19 0400 04/10/19 0827   04/09/19 2200  azithromycin (ZITHROMAX) 500  mg in sodium chloride 0.9 % 250 mL IVPB  Status:  Discontinued     500 mg 250 mL/hr over 60 Minutes Intravenous Every 24 hours 04/09/19 0400 04/10/19 0827   04/09/19 1400  remdesivir 200 mg in sodium chloride 0.9 % 250 mL IVPB     200 mg 500 mL/hr over 30 Minutes Intravenous Once 04/09/19 1321 04/09/19 1642   04/09/19 0000  cefTRIAXone (ROCEPHIN) 1 g in sodium chloride 0.9 % 100 mL IVPB     1 g 200 mL/hr over 30 Minutes Intravenous  Once 04/08/19 2353 04/09/19 0115   04/09/19 0000  azithromycin (ZITHROMAX) 500 mg in sodium chloride  0.9 % 250 mL IVPB     500 mg 250 mL/hr over 60 Minutes Intravenous  Once 04/08/19 2353 04/09/19 0132     Subjective: No new concerns  Objective: Vitals:   04/29/19 0255 04/29/19 0800 04/29/19 1251 04/29/19 1615  BP: 126/80 129/77 117/74 122/79  Pulse: (!) 101     Resp: 18  20 20   Temp: 98.2 F (36.8 C) 97.8 F (36.6 C) 98.3 F (36.8 C) 98.4 F (36.9 C)  TempSrc: Oral Oral Oral Oral  SpO2: 92%     Weight:      Height:        Intake/Output Summary (Last 24 hours) at 04/29/2019 1748 Last data filed at 04/29/2019 1258 Gross per 24 hour  Intake -  Output 400 ml  Net -400 ml   Filed Weights   04/27/19 0500 04/28/19 0500 04/29/19 0252  Weight: 93.7 kg 90.2 kg 88 kg    Examination:  General: No acute distress. Cardiovascular: RRR Lungs: comfortable on 4 L , no increased wob, distant lung sounds Abdomen: Soft, nontender, nondistended  Neurological: Alert and oriented 3. Moves all extremities 4 . Cranial nerves II through XII grossly intact. Skin: Warm and dry. No rashes or lesions. Extremities: No clubbing or cyanosis. No edema. R BKA   Data Reviewed: I have personally reviewed following labs and imaging studies  CBC: Recent Labs  Lab 04/25/19 0430 04/26/19 0600 04/27/19 0039 04/28/19 0141 04/29/19 0322  WBC 8.4 8.4 10.1 10.3 10.8*  NEUTROABS 5.8 6.4 8.7* 7.2 7.8*  HGB 15.7 14.2 14.6 15.2 14.8  HCT 48.3 43.5 44.6 46.0 44.8  MCV 96.8 95.8 96.3 95.2 93.9  PLT 54* 56* 64* 79* 123XX123*   Basic Metabolic Panel: Recent Labs  Lab 04/25/19 0430 04/26/19 0600 04/27/19 0039 04/28/19 0141 04/29/19 0322  NA 139 137 137 138 137  K 4.2 4.1 5.1 4.4 4.2  CL 100 101 97* 96* 91*  CO2 26 27 27 30  33*  GLUCOSE 114* 142* 118* 114* 115*  BUN 19 17 18 19 22   CREATININE 0.97 0.87 0.79 0.84 1.05  CALCIUM 8.4* 8.4* 8.2* 8.9 8.8*  MG  --  2.0 2.2 2.1 2.2   GFR: Estimated Creatinine Clearance: 74.9 mL/min (by C-G formula based on SCr of 1.05 mg/dL). Liver Function Tests:  Recent Labs  Lab 04/25/19 0430 04/26/19 0600 04/27/19 0039 04/28/19 0141 04/29/19 0322  AST 37 33 33 38 40  ALT 51* 42 45* 51* 64*  ALKPHOS 79 65 66 70 72  BILITOT 0.9 1.4* 1.0 1.2 1.5*  PROT 6.3* 6.2* 6.7 6.9 7.1  ALBUMIN 3.3* 3.3* 3.4* 3.4* 3.7   No results for input(s): LIPASE, AMYLASE in the last 168 hours. No results for input(s): AMMONIA in the last 168 hours. Coagulation Profile: No results for input(s): INR, PROTIME in the last 168 hours.  Cardiac Enzymes: No results for input(s): CKTOTAL, CKMB, CKMBINDEX, TROPONINI in the last 168 hours. BNP (last 3 results) No results for input(s): PROBNP in the last 8760 hours. HbA1C: No results for input(s): HGBA1C in the last 72 hours. CBG: Recent Labs  Lab 04/28/19 1120 04/28/19 1629 04/29/19 0856 04/29/19 1242 04/29/19 1726  GLUCAP 185* 181* 114* 167* 178*   Lipid Profile: No results for input(s): CHOL, HDL, LDLCALC, TRIG, CHOLHDL, LDLDIRECT in the last 72 hours. Thyroid Function Tests: No results for input(s): TSH, T4TOTAL, FREET4, T3FREE, THYROIDAB in the last 72 hours. Anemia Panel: Recent Labs    04/28/19 0141 04/29/19 0322  FERRITIN 235 253   Sepsis Labs: No results for input(s): PROCALCITON, LATICACIDVEN in the last 168 hours.  No results found for this or any previous visit (from the past 240 hour(s)).       Radiology Studies: No results found.      Scheduled Meds: . aspirin  81 mg Oral Daily  . atorvastatin  40 mg Oral q1800  . chlorpheniramine-HYDROcodone  5 mL Oral Q12H  . famotidine  20 mg Oral BID  . fluticasone  2 spray Each Nare BID  . insulin aspart  0-9 Units Subcutaneous TID WC  . Ipratropium-Albuterol  1 puff Inhalation Q6H  . predniSONE  30 mg Oral Q breakfast   Followed by  . [START ON 05/02/2019] predniSONE  20 mg Oral Q breakfast   Followed by  . [START ON 05/05/2019] predniSONE  10 mg Oral Q breakfast  . Rivaroxaban  15 mg Oral BID WC  . [START ON 05/03/2019] rivaroxaban   20 mg Oral Q supper  . sodium chloride flush  10-40 mL Intracatheter Q12H  . tamsulosin  0.4 mg Oral QPC supper  . ursodiol  300 mg Oral BID  . vitamin C  500 mg Oral Daily  . zinc sulfate  220 mg Oral Daily   Continuous Infusions:    LOS: 20 days    Time spent: over 30 min    Fayrene Helper, MD [facility] Pager AMION  If 7PM-7AM, please contact night-coverage [email or web address] Password [facility] 04/29/2019, 5:48 PM

## 2019-04-30 LAB — CBC WITH DIFFERENTIAL/PLATELET
Abs Immature Granulocytes: 0.03 10*3/uL (ref 0.00–0.07)
Basophils Absolute: 0.1 10*3/uL (ref 0.0–0.1)
Basophils Relative: 1 %
Eosinophils Absolute: 0.3 10*3/uL (ref 0.0–0.5)
Eosinophils Relative: 3 %
HCT: 46.2 % (ref 39.0–52.0)
Hemoglobin: 15.2 g/dL (ref 13.0–17.0)
Immature Granulocytes: 0 %
Lymphocytes Relative: 15 %
Lymphs Abs: 1.5 10*3/uL (ref 0.7–4.0)
MCH: 31.2 pg (ref 26.0–34.0)
MCHC: 32.9 g/dL (ref 30.0–36.0)
MCV: 94.9 fL (ref 80.0–100.0)
Monocytes Absolute: 1.2 10*3/uL — ABNORMAL HIGH (ref 0.1–1.0)
Monocytes Relative: 12 %
Neutro Abs: 6.7 10*3/uL (ref 1.7–7.7)
Neutrophils Relative %: 69 %
Platelets: 121 10*3/uL — ABNORMAL LOW (ref 150–400)
RBC: 4.87 MIL/uL (ref 4.22–5.81)
RDW: 14.5 % (ref 11.5–15.5)
WBC: 9.7 10*3/uL (ref 4.0–10.5)
nRBC: 0 % (ref 0.0–0.2)

## 2019-04-30 LAB — COMPREHENSIVE METABOLIC PANEL
ALT: 77 U/L — ABNORMAL HIGH (ref 0–44)
AST: 42 U/L — ABNORMAL HIGH (ref 15–41)
Albumin: 3.6 g/dL (ref 3.5–5.0)
Alkaline Phosphatase: 71 U/L (ref 38–126)
Anion gap: 13 (ref 5–15)
BUN: 21 mg/dL (ref 8–23)
CO2: 33 mmol/L — ABNORMAL HIGH (ref 22–32)
Calcium: 9 mg/dL (ref 8.9–10.3)
Chloride: 91 mmol/L — ABNORMAL LOW (ref 98–111)
Creatinine, Ser: 0.98 mg/dL (ref 0.61–1.24)
GFR calc Af Amer: >60 mL/min (ref >60)
GFR calc non Af Amer: >60 mL/min (ref >60)
Glucose, Bld: 123 mg/dL — ABNORMAL HIGH (ref 70–99)
Potassium: 4.7 mmol/L (ref 3.5–5.1)
Sodium: 137 mmol/L (ref 135–145)
Total Bilirubin: 1.6 mg/dL — ABNORMAL HIGH (ref 0.3–1.2)
Total Protein: 7.2 g/dL (ref 6.5–8.1)

## 2019-04-30 LAB — GLUCOSE, CAPILLARY
Glucose-Capillary: 223 mg/dL — ABNORMAL HIGH (ref 70–99)
Glucose-Capillary: 209 mg/dL — ABNORMAL HIGH (ref 70–99)
Glucose-Capillary: 116 mg/dL — ABNORMAL HIGH (ref 70–99)
Glucose-Capillary: 240 mg/dL — ABNORMAL HIGH (ref 70–99)
Glucose-Capillary: 198 mg/dL — ABNORMAL HIGH (ref 70–99)
Glucose-Capillary: 212 mg/dL — ABNORMAL HIGH (ref 70–99)

## 2019-04-30 LAB — MAGNESIUM: Magnesium: 2.1 mg/dL (ref 1.7–2.4)

## 2019-04-30 LAB — D-DIMER, QUANTITATIVE: D-Dimer, Quant: 3.98 ug/mL-FEU — ABNORMAL HIGH (ref 0.00–0.50)

## 2019-04-30 LAB — FERRITIN: Ferritin: 242 ng/mL (ref 24–336)

## 2019-04-30 LAB — C-REACTIVE PROTEIN: CRP: 2.3 mg/dL — ABNORMAL HIGH (ref <1.0)

## 2019-04-30 MED ORDER — ALPRAZOLAM 0.5 MG PO TABS
0.5000 mg | ORAL_TABLET | Freq: Once | ORAL | Status: AC | PRN
  Administered 2019-05-01: 0.5 mg via ORAL
  Filled 2019-04-30: qty 1

## 2019-04-30 NOTE — Progress Notes (Signed)
PROGRESS NOTE    [name]  [id number] DOB: 1956-05-11 DOA: 04/08/2019 PCP: [name], FNP   Brief Narrative:  [name] is [name] 63 y.o. male with [name] history of HTN, HLD, and right BKA who presented 11/15 with fever and cough persisting despite antibiotics and prednisone as an outpatient. He was found to be hypoxic with left upper lobe infiltrate and SARS-CoV-2 PCR positive. Remdesivir and steroids were given. Due to persistent hypoxemia, convalescent plasma and tocilizumab were administered and CTA chest performed. This demonstrated segmental and subsegmental pulmonary emboli within the RLL. Therapeutic anticoagulation has been given, though the patient's hypoxemia continues with slow recovery. Lasix has been given intermittently and antibiotics were started when CXR demonstrated [name] possible bacterial coinfection 11/25.   Assessment & Plan:   Principal Problem:   Pneumonia due to COVID-19 virus Active Problems:   Acute respiratory failure with hypoxia (HCC)   Essential hypertension   HLD (hyperlipidemia)   Community acquired pneumonia   Transaminitis   Hyperglycemia   AKI (acute kidney injury) ([place])   Bilateral pulmonary embolism (HCC)   Lobar pneumonia (HCC)   Acute hypoxic respiratory failure: Multifactorial, due to covid-19 pneumonia and acute PE as well as possible superimposed bacterial pneumonia.  - Hypoxemia improving slowly, but remains severe -  Improving today, on about 2.5 L [place] - D dimer slightly improved today, continue to monitor - CRP has normalized while d-dimer remains >20. We have switched to po prednisone on 11/27. Will stop this now that it's been given >2 weeks. Has received tocilizumab and convalescent plasma on 11/17.  - I/O, daily weights - continue to hold lasix - Continue ceftriaxone x7 days (11/25 - 12/1) - Continue therapeutic anticoagulation.  - Continue IS, FV, OOB, antitussives - Airborne/contact precautions. - CIR recommended  disposition, will need negative testing.  Needs further improvement in resp status. - CXR with multifocal pneumonia, improved in R lung base - CXR 12/4 stable  He's outside of 21 day isolation period.  O2 requirement is improving.  Will continue to monitor for now.  Consider transfer if he remains admitted.  COVID-19 Labs  Recent Labs    04/28/19 0141 04/29/19 0322 04/30/19 0740  DDIMER 6.07* 4.65* 3.98*  FERRITIN 235 253 242  CRP 1.2* 1.8* 2.3*    Lab Results  Component Value Date   SARSCOV2NAA POSITIVE ([name]) 04/08/2019   SARSCOV2NAA Detected ([name]) 04/04/2019   LLE DVT and Acute PE: continue anticoagulation as noted - xarelto  Thrombocytopenia: Reports he may have had this episodically in the past but denies any liver dysfunction/hematologic consultations or personal or family history of this. At intermediate risk of HITT (4T's = 5). Would have expected improvement if this was HIT, though recovery can be delayed due to covid. Also [name] concern would be alloimmunity with convalescent plasma though this was given 11/17 and platelets noted to be normal 11/19. No evidence of hemolysis.  - Stopped heparin and started xarelto at the time when platelets were still wnl. Platelet count once found to have dropped has remained very stable just above 50k. No major bleeding noted and hgb is wnl/stable.  - Discussed the case with Dr. Irene [name], hematology, who recommended checking SRA (negative) and immature platelet fraction (elevated) in addition to HIT Ab (wnl). Platelet count is stable.  Will discuss again with Dr. Irene [name] - notes most likely 2/2 viral ifnection.  Improving. - Continue monitoring platelets. If bleeding develops or platelet count dips near to 30k, would suggest platelet transfusion.  If refractory bleeding/worsened thrombocytopenia, would have to consider decreasing dose of xarelto vs. argatroban.   T2DM with steroid-induced hyperglycemia: HbA1c 7.1%.  - Continue SSI, improving as steroids  have been tapered  HLD:  - Continue statin  BPH:  - Continue flomax  Obesity: BMI 37. Noted.  Right hand apraxia and edema: Appears stable. CT head without acute infarct noted initially. Not consistent with seizure. ?Critical illness neuropathy.  - Elevated RUE - Definite deficit noted. Would recheck CT once patient amenable, currently stating he's unsure he'd tolerate laying flat that long (stable today). - MRI when able - will discuss with neurology (agreed with above) - OT reconsulted to concentrate on this.  - If treating as [name] stroke, he would get statin (which he's on) and antiplatelet. 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  - Control risk factors.  Suspected Gout Flare: L first MTP joint with pain, start steroid taper, follow uric acid level (wnl) - improved  DVT prophylaxis: xarelto  Code Status: full  Family Communication: none at bedside - called son, no answer 12/2 - discussed with wife 12/6 Disposition Plan: pending further improvement  Consultants:   none  Procedures:  Echo IMPRESSIONS    1. Left ventricular ejection fraction, by visual estimation, is 60 to 65%. The left ventricle has normal function. There is mildly increased left ventricular hypertrophy.  2. Left ventricular diastolic parameters are consistent with Grade I diastolic dysfunction (impaired relaxation).  3. Global right ventricle has normal systolic function.The right ventricular size is normal.  4. Left atrial size was normal.  5. Right atrial size was normal.  6. The mitral valve is normal in structure. No evidence of mitral valve regurgitation. No evidence of mitral stenosis.  7. The tricuspid valve is normal in structure. Tricuspid valve regurgitation is mild.  8. The aortic valve is tricuspid. Aortic valve regurgitation is not visualized. No evidence of aortic valve sclerosis or stenosis.   9. The pulmonic valve was not well visualized. Pulmonic valve regurgitation is not visualized. 10. Technically difficult; normal LV systolic function; mild LVH; grade1 diastolic dysfunction; mild TR with peak velocity of 3.8 m/s suggestive of at least moderate pulmonary hypertension; IVC not visualized and therefore PASP cannot be estimated.  LE [place] Summary: Right: There is no evidence of deep vein thrombosis in the lower extremity. Left: Findings consistent with acute deep vein thrombosis involving the left posterior tibial veins.  Antimicrobials:  Anti-infectives (From admission, onward)   Start     Dose/Rate Route Frequency Ordered Stop   04/19/19 1130  cefTRIAXone (ROCEPHIN) 2 g in sodium chloride 0.9 % 100 mL IVPB     2 g 200 mL/hr over 30 Minutes Intravenous Every 24 hours 04/19/19 0932 04/26/19 1129   04/18/19 1130  cefTRIAXone (ROCEPHIN) 1 g in sodium chloride 0.9 % 100 mL IVPB  Status:  Discontinued     1 g 200 mL/hr over 30 Minutes Intravenous Every 24 hours 04/18/19 1123 04/19/19 0932   04/10/19 1000  remdesivir 100 mg in sodium chloride 0.9 % 250 mL IVPB     100 mg 500 mL/hr over 30 Minutes Intravenous Every 24 hours 04/09/19 1321 04/13/19 1933   04/09/19 2200  cefTRIAXone (ROCEPHIN) 2 g in sodium chloride 0.9 % 100 mL IVPB  Status:  Discontinued     2 g 200 mL/hr over 30 Minutes Intravenous Every 24 hours 04/09/19 0400 04/10/19 0827  04/09/19 2200  azithromycin (ZITHROMAX) 500 mg in sodium chloride 0.9 % 250 mL IVPB  Status:  Discontinued     500 mg 250 mL/hr over 60 Minutes Intravenous Every 24 hours 04/09/19 0400 04/10/19 0827   04/09/19 1400  remdesivir 200 mg in sodium chloride 0.9 % 250 mL IVPB     200 mg 500 mL/hr over 30 Minutes Intravenous Once 04/09/19 1321 04/09/19 1642   04/09/19 0000  cefTRIAXone (ROCEPHIN) 1 g in sodium chloride 0.9 % 100 mL IVPB     1 g 200 mL/hr over 30 Minutes Intravenous  Once 04/08/19 2353 04/09/19 0115   04/09/19 0000  azithromycin  (ZITHROMAX) 500 mg in sodium chloride 0.9 % 250 mL IVPB     500 mg 250 mL/hr over 60 Minutes Intravenous  Once 04/08/19 2353 04/09/19 0132     Subjective: No new complaints  Objective: Vitals:   04/30/19 0423 04/30/19 0430 04/30/19 0730 04/30/19 1543  BP: 120/75  116/71 121/74  Pulse: 91     Resp: 20  20   Temp: 97.9 F (36.6 C)  98.1 F (36.7 C) 98.1 F (36.7 C)  TempSrc: Oral  Oral Oral  SpO2: 98%     Weight:  88 kg    Height:        Intake/Output Summary (Last 24 hours) at 04/30/2019 1747 Last data filed at 04/30/2019 1425 Gross per 24 hour  Intake 960 ml  Output 1275 ml  Net -315 ml   Filed Weights   04/28/19 0500 04/29/19 0252 04/30/19 0430  Weight: 90.2 kg 88 kg 88 kg    Examination:  General: No acute distress. Cardiovascular: RRR Lungs: Clear to auscultation bilaterally . Abdomen: Soft, nontender, nondistended Neurological: Alert and oriented 3. Moves all extremities 4. Cranial nerves II through XII grossly intact. Skin: Warm and dry. No rashes or lesions. Extremities: LLE first MTP pain improved, R BKA    Data Reviewed: I have personally reviewed following labs and imaging studies  CBC: Recent Labs  Lab 04/26/19 0600 04/27/19 0039 04/28/19 0141 04/29/19 0322 04/30/19 0740  WBC 8.4 10.1 10.3 10.8* 9.7  NEUTROABS 6.4 8.7* 7.2 7.8* 6.7  HGB 14.2 14.6 15.2 14.8 15.2  HCT 43.5 44.6 46.0 44.8 46.2  MCV 95.8 96.3 95.2 93.9 94.9  PLT 56* 64* 79* 100* 123XX123*   Basic Metabolic Panel: Recent Labs  Lab 04/26/19 0600 04/27/19 0039 04/28/19 0141 04/29/19 0322 04/30/19 0740  NA 137 137 138 137 137  K 4.1 5.1 4.4 4.2 4.7  CL 101 97* 96* 91* 91*  CO2 27 27 30  33* 33*  GLUCOSE 142* 118* 114* 115* 123*  BUN 17 18 19 22 21   CREATININE 0.87 0.79 0.84 1.05 0.98  CALCIUM 8.4* 8.2* 8.9 8.8* 9.0  MG 2.0 2.2 2.1 2.2 2.1   GFR: Estimated Creatinine Clearance: 80.2 mL/min (by C-G formula based on SCr of 0.98 mg/dL). Liver Function Tests: Recent Labs   Lab 04/26/19 0600 04/27/19 0039 04/28/19 0141 04/29/19 0322 04/30/19 0740  AST 33 33 38 40 42*  ALT 42 45* 51* 64* 77*  ALKPHOS 65 66 70 72 71  BILITOT 1.4* 1.0 1.2 1.5* 1.6*  PROT 6.2* 6.7 6.9 7.1 7.2  ALBUMIN 3.3* 3.4* 3.4* 3.7 3.6   No results for input(s): LIPASE, AMYLASE in the last 168 hours. No results for input(s): AMMONIA in the last 168 hours. Coagulation Profile: No results for input(s): INR, PROTIME in the last 168 hours. Cardiac Enzymes: No results for input(s):  CKTOTAL, CKMB, CKMBINDEX, TROPONINI in the last 168 hours. BNP (last 3 results) No results for input(s): PROBNP in the last 8760 hours. HbA1C: No results for input(s): HGBA1C in the last 72 hours. CBG: Recent Labs  Lab 04/29/19 1726 04/29/19 2020 04/30/19 0732 04/30/19 1126 04/30/19 1541  GLUCAP 178* 166* 116* 240* 198*   Lipid Profile: No results for input(s): CHOL, HDL, LDLCALC, TRIG, CHOLHDL, LDLDIRECT in the last 72 hours. Thyroid Function Tests: No results for input(s): TSH, T4TOTAL, FREET4, T3FREE, THYROIDAB in the last 72 hours. Anemia Panel: Recent Labs    04/29/19 0322 04/30/19 0740  FERRITIN 253 242   Sepsis Labs: No results for input(s): PROCALCITON, LATICACIDVEN in the last 168 hours.  No results found for this or any previous visit (from the past 240 hour(s)).       Radiology Studies: No results found.      Scheduled Meds: . aspirin  81 mg Oral Daily  . atorvastatin  40 mg Oral q1800  . chlorpheniramine-HYDROcodone  5 mL Oral Q12H  . famotidine  20 mg Oral BID  . fluticasone  2 spray Each Nare BID  . insulin aspart  0-9 Units Subcutaneous TID WC  . Ipratropium-Albuterol  1 puff Inhalation Q6H  . predniSONE  30 mg Oral Q breakfast   Followed by  . [START ON 05/02/2019] predniSONE  20 mg Oral Q breakfast   Followed by  . [START ON 05/05/2019] predniSONE  10 mg Oral Q breakfast  . Rivaroxaban  15 mg Oral BID WC  . [START ON 05/03/2019] rivaroxaban  20 mg Oral Q  supper  . sodium chloride flush  10-40 mL Intracatheter Q12H  . tamsulosin  0.4 mg Oral QPC supper  . ursodiol  300 mg Oral BID  . vitamin C  500 mg Oral Daily  . zinc sulfate  220 mg Oral Daily   Continuous Infusions:    LOS: 21 days    Time spent: over 30 min    Fayrene Helper, MD [facility] Pager AMION  If 7PM-7AM, please contact night-coverage [email or web address] Password [facility] 04/30/2019, 5:47 PM

## 2019-04-30 NOTE — Progress Notes (Signed)
Pt MRI scheduled for 12/8 at 9:00 pm at [facility].

## 2019-05-01 ENCOUNTER — Inpatient Hospital Stay (HOSPITAL_COMMUNITY): Payer: PRIVATE HEALTH INSURANCE

## 2019-05-01 ENCOUNTER — Encounter (HOSPITAL_COMMUNITY): Payer: Self-pay

## 2019-05-01 LAB — COMPREHENSIVE METABOLIC PANEL
ALT: 82 U/L — ABNORMAL HIGH (ref 0–44)
AST: 47 U/L — ABNORMAL HIGH (ref 15–41)
Albumin: 3.8 g/dL (ref 3.5–5.0)
Alkaline Phosphatase: 71 U/L (ref 38–126)
Anion gap: 11 (ref 5–15)
BUN: 21 mg/dL (ref 8–23)
CO2: 32 mmol/L (ref 22–32)
Calcium: 8.9 mg/dL (ref 8.9–10.3)
Chloride: 93 mmol/L — ABNORMAL LOW (ref 98–111)
Creatinine, Ser: 0.87 mg/dL (ref 0.61–1.24)
GFR calc Af Amer: >60 mL/min (ref >60)
GFR calc non Af Amer: >60 mL/min (ref >60)
Glucose, Bld: 100 mg/dL — ABNORMAL HIGH (ref 70–99)
Potassium: 4.6 mmol/L (ref 3.5–5.1)
Sodium: 136 mmol/L (ref 135–145)
Total Bilirubin: 1.9 mg/dL — ABNORMAL HIGH (ref 0.3–1.2)
Total Protein: 7.2 g/dL (ref 6.5–8.1)

## 2019-05-01 LAB — CBC WITH DIFFERENTIAL/PLATELET
Abs Immature Granulocytes: 0.03 10*3/uL (ref 0.00–0.07)
Basophils Absolute: 0 10*3/uL (ref 0.0–0.1)
Basophils Relative: 0 %
Eosinophils Absolute: 0.1 10*3/uL (ref 0.0–0.5)
Eosinophils Relative: 1 %
HCT: 45.1 % (ref 39.0–52.0)
Hemoglobin: 14.8 g/dL (ref 13.0–17.0)
Immature Granulocytes: 0 %
Lymphocytes Relative: 16 %
Lymphs Abs: 1.6 10*3/uL (ref 0.7–4.0)
MCH: 31.1 pg (ref 26.0–34.0)
MCHC: 32.8 g/dL (ref 30.0–36.0)
MCV: 94.7 fL (ref 80.0–100.0)
Monocytes Absolute: 1.3 10*3/uL — ABNORMAL HIGH (ref 0.1–1.0)
Monocytes Relative: 12 %
Neutro Abs: 7.2 10*3/uL (ref 1.7–7.7)
Neutrophils Relative %: 71 %
Platelets: 159 10*3/uL (ref 150–400)
RBC: 4.76 MIL/uL (ref 4.22–5.81)
RDW: 14.3 % (ref 11.5–15.5)
WBC: 10.2 10*3/uL (ref 4.0–10.5)
nRBC: 0 % (ref 0.0–0.2)

## 2019-05-01 LAB — GLUCOSE, CAPILLARY
Glucose-Capillary: 126 mg/dL — ABNORMAL HIGH (ref 70–99)
Glucose-Capillary: 255 mg/dL — ABNORMAL HIGH (ref 70–99)
Glucose-Capillary: 216 mg/dL — ABNORMAL HIGH (ref 70–99)
Glucose-Capillary: 223 mg/dL — ABNORMAL HIGH (ref 70–99)

## 2019-05-01 LAB — C-REACTIVE PROTEIN: CRP: 1.7 mg/dL — ABNORMAL HIGH (ref <1.0)

## 2019-05-01 LAB — FERRITIN: Ferritin: 280 ng/mL (ref 24–336)

## 2019-05-01 LAB — MAGNESIUM: Magnesium: 2.2 mg/dL (ref 1.7–2.4)

## 2019-05-01 LAB — D-DIMER, QUANTITATIVE: D-Dimer, Quant: 3.92 ug/mL-FEU — ABNORMAL HIGH (ref 0.00–0.50)

## 2019-05-01 NOTE — Progress Notes (Signed)
Pt transported with [facility] to main hosp for MRI. VSS, 2LNC.

## 2019-05-01 NOTE — Progress Notes (Signed)
Inpatient Rehab Admissions Coordinator:   Spoke with Dr. Florene Glen regarding pt's decreasing supplemental O2 requirements.  Inpatient Rehab consult requested and we will follow for PT/OT progress for possible CIR admission.   Shann Medal, PT, DPT Admissions Coordinator [phone] 05/01/19  11:49 AM

## 2019-05-01 NOTE — Progress Notes (Addendum)
PROGRESS NOTE    [name]  [id number] DOB: 15-Jan-1956 DOA: 04/08/2019 PCP: [name], FNP   Brief Narrative:  [name] is a 63 y.o. male with a history of HTN, HLD, and right BKA who presented 11/15 with fever and cough persisting despite antibiotics and prednisone as an outpatient. He was found to be hypoxic with left upper lobe infiltrate and SARS-CoV-2 PCR positive. Remdesivir and steroids were given. Due to persistent hypoxemia, convalescent plasma and tocilizumab were administered and CTA chest performed. This demonstrated segmental and subsegmental pulmonary emboli within the RLL. Therapeutic anticoagulation has been given, though the patient's hypoxemia continues with slow recovery. Lasix has been given intermittently and antibiotics were started when CXR demonstrated a possible bacterial coinfection 11/25.   He was admitted for COVID 19 pneumonia with severe hypoxia requiring heated high flow.  Hospitalization c/b VTE with PE and DVT, now on antiocoagulation.  Had severe thrombocytopenia, which has improved.  His O2 needs are gradually improving, currently on 2 L.  He has R hand apraxia for which an MRI is being obtained on 12/8 PM.  Therapy recommending CIR.  Hopefully pt will be able to be discharged to CIR within next few days.  Patient 27 days since initial positive test.    Assessment & Plan:   Principal Problem:   Pneumonia due to COVID-19 virus Active Problems:   Acute respiratory failure with hypoxia (HCC)   Essential hypertension   HLD (hyperlipidemia)   Community acquired pneumonia   Transaminitis   Hyperglycemia   AKI (acute kidney injury) ([place])   Bilateral pulmonary embolism (HCC)   Lobar pneumonia (HCC)   Acute hypoxic respiratory failure: Multifactorial, due to covid-19 pneumonia and acute PE as well as possible superimposed bacterial pneumonia.  - Hypoxemia improving slowly, but remains severe -  Improving today, on about 2 L [place] - D dimer  slightly improved today, continue to monitor - CRP has normalized while d-dimer remains >20. We have switched to po prednisone on 11/27. Will stop this now that it's been given >2 weeks. Has received tocilizumab and convalescent plasma on 11/17.  - I/O, daily weights - continue to hold lasix - Continue ceftriaxone x7 days (11/25 - 12/1) - Continue therapeutic anticoagulation.  - Continue IS, FV, OOB, antitussives - Airborne/contact precautions. - CXR with multifocal pneumonia, improved in R lung base - CXR 12/4 stable - He's 27 days out from positive test.  With his improvement currently on 2 L, will likely be ok to d/c precautions soon as long as continued improvement.  Consider discussion with ID with need for CIR.  COVID-19 Labs  Recent Labs    04/29/19 0322 04/30/19 0740 05/01/19 0341  DDIMER 4.65* 3.98* 3.92*  FERRITIN 253 242 280  CRP 1.8* 2.3* 1.7*    Lab Results  Component Value Date   SARSCOV2NAA POSITIVE (A) 04/08/2019   SARSCOV2NAA Detected (A) 04/04/2019   LLE DVT and Acute PE: continue anticoagulation as noted - xarelto  Thrombocytopenia: Reports he may have had this episodically in the past but denies any liver dysfunction/hematologic consultations or personal or family history of this. At intermediate risk of HITT (4T's = 5). Would have expected improvement if this was HIT, though recovery can be delayed due to covid. Also a concern would be alloimmunity with convalescent plasma though this was given 11/17 and platelets noted to be normal 11/19. No evidence of hemolysis.  - Stopped heparin and started xarelto at the time when platelets were still wnl.  Platelet count once found to have dropped has remained very stable just above 50k. No major bleeding noted and hgb is wnl/stable.  - Discussed the case with Dr. Irene Limbo, hematology, who recommended checking SRA (negative) and immature platelet fraction (elevated) in addition to HIT Ab (wnl). Platelet count is stable.  Will  discuss again with Dr. Irene Limbo - notes most likely 2/2 viral ifnection.  Improved. - Continue monitoring platelets. If bleeding develops or platelet count dips near to 30k, would suggest platelet transfusion. If refractory bleeding/worsened thrombocytopenia, would have to consider decreasing dose of xarelto vs. argatroban.   T2DM with steroid-induced hyperglycemia: HbA1c 7.1%.  - Continue SSI, watch on steroid taper  HLD:  - Continue statin  BPH:  - Continue flomax  Obesity: BMI 37. Noted.  Right hand apraxia and edema: Appears stable. CT head without acute infarct noted initially. Not consistent with seizure. ?Critical illness neuropathy.  - Elevated RUE - Definite deficit noted. Plan for MRI when able - hopefully 12/8 PM - will discuss with neurology (agreed with above) - OT reconsulted to concentrate on this.  - If treating as a stroke, he would get statin (which he's on) and antiplatelet. 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  - Control risk factors.  Suspected Gout Flare: L first MTP joint with pain, start steroid taper, follow uric acid level (wnl) - improved  DVT prophylaxis: xarelto  Code Status: full  Family Communication: none at bedside - called son, no answer 12/2 - discussed with wife 12/6 Disposition Plan: pending further improvement  Consultants:   none  Procedures:  Echo IMPRESSIONS    1. Left ventricular ejection fraction, by visual estimation, is 60 to 65%. The left ventricle has normal function. There is mildly increased left ventricular hypertrophy.  2. Left ventricular diastolic parameters are consistent with Grade I diastolic dysfunction (impaired relaxation).  3. Global right ventricle has normal systolic function.The right ventricular size is normal.  4. Left atrial size was normal.  5. Right atrial size was normal.  6. The mitral valve is normal in  structure. No evidence of mitral valve regurgitation. No evidence of mitral stenosis.  7. The tricuspid valve is normal in structure. Tricuspid valve regurgitation is mild.  8. The aortic valve is tricuspid. Aortic valve regurgitation is not visualized. No evidence of aortic valve sclerosis or stenosis.  9. The pulmonic valve was not well visualized. Pulmonic valve regurgitation is not visualized. 10. Technically difficult; normal LV systolic function; mild LVH; grade1 diastolic dysfunction; mild TR with peak velocity of 3.8 m/s suggestive of at least moderate pulmonary hypertension; IVC not visualized and therefore PASP cannot be estimated.  LE [place] Summary: Right: There is no evidence of deep vein thrombosis in the lower extremity. Left: Findings consistent with acute deep vein thrombosis involving the left posterior tibial veins.  Antimicrobials:  Anti-infectives (From admission, onward)   Start     Dose/Rate Route Frequency Ordered Stop   04/19/19 1130  cefTRIAXone (ROCEPHIN) 2 g in sodium chloride 0.9 % 100 mL IVPB     2 g 200 mL/hr over 30 Minutes Intravenous Every 24 hours 04/19/19 0932 04/26/19 1129   04/18/19 1130  cefTRIAXone (ROCEPHIN) 1 g in sodium chloride 0.9 % 100 mL IVPB  Status:  Discontinued     1 g 200 mL/hr over 30 Minutes Intravenous Every 24 hours 04/18/19 1123 04/19/19 0932   04/10/19 1000  remdesivir 100 mg in sodium chloride 0.9 % 250 mL IVPB     100 mg 500 mL/hr over 30 Minutes Intravenous Every 24 hours 04/09/19 1321 04/13/19 1933   04/09/19 2200  cefTRIAXone (ROCEPHIN) 2 g in sodium chloride 0.9 % 100 mL IVPB  Status:  Discontinued     2 g 200 mL/hr over 30 Minutes Intravenous Every 24 hours 04/09/19 0400 04/10/19 0827   04/09/19 2200  azithromycin (ZITHROMAX) 500 mg in sodium chloride 0.9 % 250 mL IVPB  Status:  Discontinued     500 mg 250 mL/hr over 60 Minutes Intravenous Every 24 hours 04/09/19 0400 04/10/19 0827   04/09/19 1400  remdesivir 200 mg in sodium  chloride 0.9 % 250 mL IVPB     200 mg 500 mL/hr over 30 Minutes Intravenous Once 04/09/19 1321 04/09/19 1642   04/09/19 0000  cefTRIAXone (ROCEPHIN) 1 g in sodium chloride 0.9 % 100 mL IVPB     1 g 200 mL/hr over 30 Minutes Intravenous  Once 04/08/19 2353 04/09/19 0115   04/09/19 0000  azithromycin (ZITHROMAX) 500 mg in sodium chloride 0.9 % 250 mL IVPB     500 mg 250 mL/hr over 60 Minutes Intravenous  Once 04/08/19 2353 04/09/19 0132     Subjective: Happy that he's doing better. No new complaints.  Objective: Vitals:   05/01/19 0400 05/01/19 0455 05/01/19 0700 05/01/19 1100  BP: 128/82  113/74   Pulse: (!) 102  81 (!) 108  Resp: 20  19 18   Temp: 98.1 F (36.7 C)  97.8 F (36.6 C)   TempSrc: Oral  Oral   SpO2: 97%  99% 92%  Weight:  88 kg    Height:        Intake/Output Summary (Last 24 hours) at 05/01/2019 1356 Last data filed at 05/01/2019 0835 Gross per 24 hour  Intake 970 ml  Output 925 ml  Net 45 ml   Filed Weights   04/29/19 0252 04/30/19 0430 05/01/19 0455  Weight: 88 kg 88 kg 88 kg    Examination:  General: No acute distress. Cardiovascular: Heart sounds show a regular rate, and rhythm.  Lungs: Clear to auscultation bilaterally Abdomen: Soft, nontender, nondistended  Neurological: Alert and oriented 3. Moves all extremities 4. Cranial nerves II through XII grossly intact. Skin: Warm and dry. No rashes or lesions. Extremities: L first MTP pain improved, R BKA     Data Reviewed: I have personally reviewed following labs and imaging studies  CBC: Recent Labs  Lab 04/27/19 0039 04/28/19 0141 04/29/19 0322 04/30/19 0740 05/01/19 0341  WBC 10.1 10.3 10.8* 9.7 10.2  NEUTROABS 8.7* 7.2 7.8* 6.7 7.2  HGB 14.6 15.2 14.8 15.2 14.8  HCT 44.6 46.0 44.8 46.2 45.1  MCV 96.3 95.2 93.9 94.9 94.7  PLT 64* 79* 100* 121* [id number]   Basic Metabolic Panel: Recent Labs  Lab 04/27/19 0039 04/28/19 0141 04/29/19 0322 04/30/19 0740 05/01/19 0341  NA 137 138  137 137 136  K 5.1 4.4 4.2 4.7 4.6  CL 97* 96* 91* 91* 93*  CO2 27 30 33* 33* 32  GLUCOSE 118* 114* 115* 123* 100*  BUN 18 19 22 21 21   CREATININE 0.79 0.84 1.05 0.98 0.87  CALCIUM 8.2* 8.9 8.8* 9.0 8.9  MG 2.2 2.1 2.2 2.1 2.2   GFR: Estimated Creatinine Clearance: 90.3 mL/min (by C-G formula based on SCr of 0.87 mg/dL). Liver Function Tests: Recent Labs  Lab 04/27/19 0039 04/28/19 0141 04/29/19 0322 04/30/19 0740 05/01/19  0341  AST 33 38 40 42* 47*  ALT 45* 51* 64* 77* 82*  ALKPHOS 66 70 72 71 71  BILITOT 1.0 1.2 1.5* 1.6* 1.9*  PROT 6.7 6.9 7.1 7.2 7.2  ALBUMIN 3.4* 3.4* 3.7 3.6 3.8   No results for input(s): LIPASE, AMYLASE in the last 168 hours. No results for input(s): AMMONIA in the last 168 hours. Coagulation Profile: No results for input(s): INR, PROTIME in the last 168 hours. Cardiac Enzymes: No results for input(s): CKTOTAL, CKMB, CKMBINDEX, TROPONINI in the last 168 hours. BNP (last 3 results) No results for input(s): PROBNP in the last 8760 hours. HbA1C: No results for input(s): HGBA1C in the last 72 hours. CBG: Recent Labs  Lab 04/30/19 1126 04/30/19 1541 04/30/19 2020 05/01/19 0748 05/01/19 1130  GLUCAP 240* 198* 212* 126* 255*   Lipid Profile: No results for input(s): CHOL, HDL, LDLCALC, TRIG, CHOLHDL, LDLDIRECT in the last 72 hours. Thyroid Function Tests: No results for input(s): TSH, T4TOTAL, FREET4, T3FREE, THYROIDAB in the last 72 hours. Anemia Panel: Recent Labs    04/30/19 0740 05/01/19 0341  FERRITIN 242 280   Sepsis Labs: No results for input(s): PROCALCITON, LATICACIDVEN in the last 168 hours.  No results found for this or any previous visit (from the past 240 hour(s)).       Radiology Studies: No results found.      Scheduled Meds: . aspirin  81 mg Oral Daily  . atorvastatin  40 mg Oral q1800  . chlorpheniramine-HYDROcodone  5 mL Oral Q12H  . famotidine  20 mg Oral BID  . fluticasone  2 spray Each Nare BID  .  insulin aspart  0-9 Units Subcutaneous TID WC  . Ipratropium-Albuterol  1 puff Inhalation Q6H  . [START ON 05/02/2019] predniSONE  20 mg Oral Q breakfast   Followed by  . [START ON 05/05/2019] predniSONE  10 mg Oral Q breakfast  . Rivaroxaban  15 mg Oral BID WC  . [START ON 05/03/2019] rivaroxaban  20 mg Oral Q supper  . sodium chloride flush  10-40 mL Intracatheter Q12H  . tamsulosin  0.4 mg Oral QPC supper  . ursodiol  300 mg Oral BID  . vitamin C  500 mg Oral Daily  . zinc sulfate  220 mg Oral Daily   Continuous Infusions:    LOS: 22 days    Time spent: over 30 min    Fayrene Helper, MD [facility] Pager AMION  If 7PM-7AM, please contact night-coverage [email or web address] Password [email or web address] 05/01/2019, 1:56 PM

## 2019-05-01 NOTE — Progress Notes (Signed)
12/8 1925 Called Charge Nurse regarding 930pm appt @[facility] for MRI.  Due to high demand of patients at [facility], asked charge nurse if patient could be transferred to [facility] for MRI or if patient can wait till morning. Charge Nurse agreed it was okay if patient goes to [facility] at 930pm for MRI. [facility] is notified of changes.

## 2019-05-01 NOTE — Plan of Care (Signed)
Patient remained on the chair for majority of the shift. No s/s of pain. Pt had complaints of shortness of breath during exertion and O2 sats remained around 87-88%. Wife was updated on plan of care and all questions were answered. MRI scheduled for this evening at 2100.   Problem: Education: Goal: Knowledge of risk factors and measures for prevention of condition will improve Outcome: Progressing   Problem: Coping: Goal: Psychosocial and spiritual needs will be supported Outcome: Progressing   Problem: Respiratory: Goal: Will maintain a patent airway Outcome: Progressing Goal: Complications related to the disease process, condition or treatment will be avoided or minimized Outcome: Progressing   Problem: Education: Goal: Knowledge of General Education information will improve Description: Including pain rating scale, medication(s)/side effects and non-pharmacologic comfort measures Outcome: Progressing   Problem: Health Behavior/Discharge Planning: Goal: Ability to manage health-related needs will improve Outcome: Progressing   Problem: Clinical Measurements: Goal: Ability to maintain clinical measurements within normal limits will improve Outcome: Progressing Goal: Will remain free from infection Outcome: Progressing Goal: Diagnostic test results will improve Outcome: Progressing Goal: Respiratory complications will improve Outcome: Progressing Goal: Cardiovascular complication will be avoided Outcome: Progressing   Problem: Activity: Goal: Risk for activity intolerance will decrease Outcome: Progressing   Problem: Nutrition: Goal: Adequate nutrition will be maintained Outcome: Progressing   Problem: Coping: Goal: Level of anxiety will decrease Outcome: Progressing   Problem: Elimination: Goal: Will not experience complications related to bowel motility Outcome: Progressing Goal: Will not experience complications related to urinary retention Outcome:  Progressing   Problem: Pain Managment: Goal: General experience of comfort will improve Outcome: Progressing   Problem: Safety: Goal: Ability to remain free from injury will improve Outcome: Progressing   Problem: Skin Integrity: Goal: Risk for impaired skin integrity will decrease Outcome: Progressing

## 2019-05-01 NOTE — Plan of Care (Signed)

## 2019-05-01 NOTE — Progress Notes (Signed)
PT Cancellation Note  Patient Details Name: [name] MRN: [id number] DOB: 26-Sep-1955   Cancelled Treatment:    Reason Eval/Treat Not Completed: Other (comment). Pt eating lunch and reports then preparing for transfer to [facility] for MRI. Politely requesting PT hold on treatment. Will check back tomorrow.  Mabeline Caras, PT, DPT Acute Rehabilitation Services  Pager [phone] Office [place] 05/01/2019, 2:20 PM

## 2019-05-02 DIAGNOSIS — I639 Cerebral infarction, unspecified: Secondary | ICD-10-CM

## 2019-05-02 DIAGNOSIS — I63512 Cerebral infarction due to unspecified occlusion or stenosis of left middle cerebral artery: Secondary | ICD-10-CM

## 2019-05-02 HISTORY — DX: Cerebral infarction due to unspecified occlusion or stenosis of left middle cerebral artery: I63.512

## 2019-05-02 LAB — CBC WITH DIFFERENTIAL/PLATELET
Abs Immature Granulocytes: 0.02 10*3/uL (ref 0.00–0.07)
Basophils Absolute: 0 10*3/uL (ref 0.0–0.1)
Basophils Relative: 0 %
Eosinophils Absolute: 0.2 10*3/uL (ref 0.0–0.5)
Eosinophils Relative: 2 %
HCT: 46 % (ref 39.0–52.0)
Hemoglobin: 15 g/dL (ref 13.0–17.0)
Immature Granulocytes: 0 %
Lymphocytes Relative: 17 %
Lymphs Abs: 1.7 10*3/uL (ref 0.7–4.0)
MCH: 31.4 pg (ref 26.0–34.0)
MCHC: 32.6 g/dL (ref 30.0–36.0)
MCV: 96.4 fL (ref 80.0–100.0)
Monocytes Absolute: 1.1 10*3/uL — ABNORMAL HIGH (ref 0.1–1.0)
Monocytes Relative: 11 %
Neutro Abs: 6.5 10*3/uL (ref 1.7–7.7)
Neutrophils Relative %: 70 %
Platelets: 180 10*3/uL (ref 150–400)
RBC: 4.77 MIL/uL (ref 4.22–5.81)
RDW: 14.3 % (ref 11.5–15.5)
WBC: 9.5 10*3/uL (ref 4.0–10.5)
nRBC: 0 % (ref 0.0–0.2)

## 2019-05-02 LAB — C-REACTIVE PROTEIN: CRP: 1.3 mg/dL — ABNORMAL HIGH (ref <1.0)

## 2019-05-02 LAB — COMPREHENSIVE METABOLIC PANEL
ALT: 80 U/L — ABNORMAL HIGH (ref 0–44)
AST: 36 U/L (ref 15–41)
Albumin: 3.6 g/dL (ref 3.5–5.0)
Alkaline Phosphatase: 68 U/L (ref 38–126)
Anion gap: 10 (ref 5–15)
BUN: 19 mg/dL (ref 8–23)
CO2: 33 mmol/L — ABNORMAL HIGH (ref 22–32)
Calcium: 8.9 mg/dL (ref 8.9–10.3)
Chloride: 93 mmol/L — ABNORMAL LOW (ref 98–111)
Creatinine, Ser: 0.89 mg/dL (ref 0.61–1.24)
GFR calc Af Amer: >60 mL/min (ref >60)
GFR calc non Af Amer: >60 mL/min (ref >60)
Glucose, Bld: 110 mg/dL — ABNORMAL HIGH (ref 70–99)
Potassium: 4.8 mmol/L (ref 3.5–5.1)
Sodium: 136 mmol/L (ref 135–145)
Total Bilirubin: 1.5 mg/dL — ABNORMAL HIGH (ref 0.3–1.2)
Total Protein: 6.9 g/dL (ref 6.5–8.1)

## 2019-05-02 LAB — GLUCOSE, CAPILLARY
Glucose-Capillary: 98 mg/dL (ref 70–99)
Glucose-Capillary: 193 mg/dL — ABNORMAL HIGH (ref 70–99)
Glucose-Capillary: 161 mg/dL — ABNORMAL HIGH (ref 70–99)
Glucose-Capillary: 152 mg/dL — ABNORMAL HIGH (ref 70–99)
Glucose-Capillary: 105 mg/dL — ABNORMAL HIGH (ref 70–99)

## 2019-05-02 LAB — FERRITIN: Ferritin: 271 ng/mL (ref 24–336)

## 2019-05-02 LAB — MAGNESIUM: Magnesium: 2.2 mg/dL (ref 1.7–2.4)

## 2019-05-02 LAB — D-DIMER, QUANTITATIVE: D-Dimer, Quant: 3.63 ug/mL-FEU — ABNORMAL HIGH (ref 0.00–0.50)

## 2019-05-02 MED ORDER — IPRATROPIUM-ALBUTEROL 20-100 MCG/ACT IN AERS
1.0000 | INHALATION_SPRAY | Freq: Three times a day (TID) | RESPIRATORY_TRACT | Status: DC
  Administered 2019-05-03 – 2019-05-04 (×3): 1 via RESPIRATORY_TRACT
  Filled 2019-05-02: qty 4

## 2019-05-02 MED ORDER — IPRATROPIUM-ALBUTEROL 20-100 MCG/ACT IN AERS: 1.0000 | INHALATION_SPRAY | Freq: Four times a day (QID) | RESPIRATORY_TRACT | Status: DC | PRN

## 2019-05-02 MED ORDER — STROKE: EARLY STAGES OF RECOVERY BOOK
Freq: Once | Status: AC
  Administered 2019-05-02: 18:00:00
  Filled 2019-05-02: qty 1

## 2019-05-02 NOTE — Progress Notes (Signed)
Inpatient Rehab Admissions:  Inpatient Rehab Consult received.  Note pt with transfer orders to [facility].  Will f/u with pt once in house for rehab assessment.   Signed: Shann Medal, PT, DPT Admissions Coordinator [phone] 05/02/19  11:10 AM

## 2019-05-02 NOTE — Progress Notes (Signed)
Pt. Arrived from [facility], notfied Dr. Cheral Marker of arrival, clarified orders, [facility] to be completed Q4 hours, not Q2 per Dr. Cheral Marker. [place] swallow completed. Pt. Settled in room with son coming to visit. Pt. Request stroke info not be discussed in front of his son.

## 2019-05-02 NOTE — Plan of Care (Signed)

## 2019-05-02 NOTE — Consult Note (Signed)
NEURO HOSPITALIST CONSULT NOTE   Requesting physician: Dr. Bonner Puna  Reason for Consult: Subacute left MCA territory multifocal strokes  History obtained from:  Patient and Chart    HPI:                                                                                                                                          [name] is an 63 y.o. male with a history of right BKA, DM2, gout, stage I CKD, BKA, HTN, HLD and hospitalization at [facility] for Covid PNA, who was transferred to [facility] today for evaluation of a subacute left MCA territory stroke which was seen on MRI. He still carries a diagnosis of acute hypoxic respiratory failure, which is multifactorial due to sequelae of Covid PNA, acute PE and possible superimposed bacterial PNA. He is now almost a month out from his positive Covid test. It has been determined by ID that airborne isolation precautions are no longer indicated. He is on therapeutic anticoagulation with Xarelto for the PE and a LLE DVT. He is also on ASA and statin. He has thrombocytopenia with negative HIT Ab. He is on prednisone taper for a suspected gout flare.   He started having right hand weakness suddenly about one week ago. This has since resolved. An MRI brain was obtained, revealing a subacute left MCA territory ischemic infarction.   MRI brain 12/8:  1. Multifocal acute ischemia within the left MCA territory, with largest area at the base of the left central sulcus. No hemorrhage or mass effect. 2. Single punctate focus of acute ischemia within the anterior right parietal white matter.   Past Medical History:  Diagnosis Date  . GERD (gastroesophageal reflux disease)   . Hx of right BKA ([place])    uses prosthetic   . Hyperlipidemia   . Hypertension   . Liver disorder    tx with ursodiol     Past Surgical History:  Procedure Laterality Date  . BELOW KNEE LEG AMPUTATION Right   . CHOLECYSTECTOMY    . WISDOM TOOTH EXTRACTION       Family History  Problem Relation Age of Onset  . Colon polyps Father   . Colon cancer Neg Hx   . Rectal cancer Neg Hx   . Stomach cancer Neg Hx               Social History:  reports that he has never smoked. He has never used smokeless tobacco. He reports that he does not drink alcohol or use drugs.  No Known Allergies  MEDICATIONS:  Scheduled: . aspirin  81 mg Oral Daily  . atorvastatin  40 mg Oral q1800  . chlorpheniramine-HYDROcodone  5 mL Oral Q12H  . famotidine  20 mg Oral BID  . fluticasone  2 spray Each Nare BID  . insulin aspart  0-9 Units Subcutaneous TID WC  . Ipratropium-Albuterol  1 puff Inhalation Q6H  . predniSONE  20 mg Oral Q breakfast   Followed by  . [START ON 05/05/2019] predniSONE  10 mg Oral Q breakfast  . Rivaroxaban  15 mg Oral BID WC  . [START ON 05/03/2019] rivaroxaban  20 mg Oral Q supper  . sodium chloride flush  10-40 mL Intracatheter Q12H  . tamsulosin  0.4 mg Oral QPC supper  . ursodiol  300 mg Oral BID  . vitamin C  500 mg Oral Daily  . zinc sulfate  220 mg Oral Daily   Continuous:    ROS:                                                                                                                                       Has some cough, congestion and SOB. Other ROS per HPI with comprehensive ROS otherwise negative.    Blood pressure 122/81, pulse 99, temperature 98.3 F (36.8 C), temperature source Oral, resp. rate (!) 22, height 5\' 6"  (1.676 m), weight 88.1 kg, SpO2 95 %.   General Examination:                                                                                                       Physical Exam  HEENT-  [place]/AT Lungs- No gross wheezing noted.  Extremities- Right BKA with prosthesis   Neurological Examination Mental Status: Alert, oriented, thought content appropriate.  Speech fluent without evidence  of aphasia.  Able to follow all commands without difficulty. Cranial Nerves: II:  Visual fields intact with no extinction to DSS. PERRL.  III,IV, VI: No ptosis. EOMI. No nystagmus.  V,VII: No facial droop. Temp sensation equal bilaterally.  VIII: hearing intact to voice IX,X: Palate rises symmetrically XI: Symmetric XII: midline tongue extension Motor: RUE: Subtle 4+/5 right triceps weakness. RUE including grip and finger abduction is otherwise 5/5. RLE 5/5 LUE and LLE 5/5 Sensory: Temp and light touch intact throughout, bilaterally. No extinction.  Deep Tendon Reflexes: 2+ bilateral upper extremities. 2+ left patella. Did not elicit right patellar due to prosthesis.  Cerebellar: No ataxia with FNF bilaterally  Gait: Deferred   Lab Results: Basic Metabolic  Panel: Recent Labs  Lab 04/28/19 0141 04/29/19 0322 04/30/19 0740 05/01/19 0341 05/02/19 0610  NA 138 137 137 136 136  K 4.4 4.2 4.7 4.6 4.8  CL 96* 91* 91* 93* 93*  CO2 30 33* 33* 32 33*  GLUCOSE 114* 115* 123* 100* 110*  BUN 19 22 21 21 19   CREATININE 0.84 1.05 0.98 0.87 0.89  CALCIUM 8.9 8.8* 9.0 8.9 8.9  MG 2.1 2.2 2.1 2.2 2.2    CBC: Recent Labs  Lab 04/28/19 0141 04/29/19 0322 04/30/19 0740 05/01/19 0341 05/02/19 0610  WBC 10.3 10.8* 9.7 10.2 9.5  NEUTROABS 7.2 7.8* 6.7 7.2 6.5  HGB 15.2 14.8 15.2 14.8 15.0  HCT 46.0 44.8 46.2 45.1 46.0  MCV 95.2 93.9 94.9 94.7 96.4  PLT 79* 100* 121* 159 180    Cardiac Enzymes: No results for input(s): CKTOTAL, CKMB, CKMBINDEX, TROPONINI in the last 168 hours.  Lipid Panel: No results for input(s): CHOL, TRIG, HDL, CHOLHDL, VLDL, LDLCALC in the last 168 hours.  Imaging: Mr Brain Wo Contrast  Result Date: 05/01/2019 CLINICAL DATA:  Focal neuro deficit, > 6 hrs, stroke suspected EXAM: MRI HEAD WITHOUT CONTRAST TECHNIQUE: Multiplanar, multiecho pulse sequences of the brain and surrounding structures were obtained without intravenous contrast. COMPARISON:  None.  FINDINGS: Brain: There is multifocal abnormal diffusion restriction within the left MCA territory. The largest area is at the base of the left central sulcus. There is a single contralateral punctate focus of reduced diffusion in the anterior right parietal white matter. Minimal chronic white matter hyperintensity. Brain volume is normal. No hydrocephalus. There are no old infarcts. No acute or chronic hemorrhage. The midline structures are normal. Vascular: Flow voids are maintained. Skull and upper cervical spine: The bone marrow signal of the cranium and upper cervical vertebrae is normal. There is no skull base lesion. The visualized upper cervical spinal cord is normal. Sinuses/Orbits: There is no paranasal sinus fluid level or advanced mucosal thickening. There is no mastoid or middle ear effusion. The orbits are normal. Other: None IMPRESSION: 1. Multifocal acute ischemia within the left MCA territory, with largest area at the base of the left central sulcus. No hemorrhage or mass effect. 2. Single punctate focus of acute ischemia within the anterior right parietal white matter. Electronically Signed   By: Ulyses Jarred M.D.   On: 05/01/2019 22:10    Assessment: 63 year old male with recent treatment of Covid PNA, transferred from [facility] for management of  subacute left MCA territory multifocal strokes.  1. Exam reveals mild right triceps weakness. Motor exam otherwise 5/5. No sensory deficit or ataxia noted.  2. MRI brain: Multifocal acute ischemia within the left MCA territory, with largest area at the base of the left central sulcus. No hemorrhage or mass effect. Also noted is a single punctate focus of acute ischemia within the anterior right parietal white matter which may be artifactual or acute presentation of small vessel ischemic change. . 3. Stroke risk factors: DM2, HTN, HLD and recent Covid infection. 4. Etiology of stroke may be paradoxical embolization given recent PE. Also on DDx is  hypercoagulability secondary to Covid infection.   Recommendations: 1. Full stroke work up. 2. PT/OT/Speech 3. Continue rivaroxaban and ASA.  4. Out of permissive HTN time window. BP goal: Normotensive.  5. Continue atorvastatin.     Electronically signed: Dr. Kerney Elbe 05/02/2019, 6:59 PM

## 2019-05-02 NOTE — Progress Notes (Signed)
Occupational Therapy Treatment Patient Details Name: [name] MRN: [id number] DOB: 1955-06-07 Today's Date: 05/02/2019    History of present illness 63 y/o male w/ hx liver disorder, HTN, HLD, R BKA, GERD presented to ED with SOB, cough, fever and chills, admitted with dx acute hypoxic resp failure sec to SARS COVID 19. Follow-up CT chest showed segmental and subsegmental PE within branches of the right lower lobe. Patient noted to have rt hand weakness and incoordination; head CT 04/17/19 showing no acute changes. 04/23/19 pt reports no improvement and no worsening of rt hand. MRI 12/8 with multifocal acute ischemia in L MCA territory.    OT comments  Goals reviewed and updated. Pt continues to make progress in therapy requiring less O2 needs and demonstrating increased independence in ADLs and mobility. Pt able to ambulate to/from bathroom this date with RW and supervision. Noted 0 instances of LOB. Pt able to complete toileting task and wash hands at the sink. Pt's O2 SATs dropped to 66% after task on 2L [place]. Mod seated rest break to increase back to 93%. Pt reports min to mod shortness of breath. 2/4 DOE. Educated/instructed pt on [facility] exercises to address coordination deficits in right hand, noting good understanding and follow through. Continued education/instruction with pt on safety strategies, energy conservation, fall prevention, activity modifications, and breathing exercises with good understanding and follow through. Pt required increased time to complete all tasks with min redirection back to tasks due to distractibility. Pt would benefit from continued rehab at [facility] prior to discharge home.     Follow Up Recommendations  CIR(Pt able to tolerate 3 hours of therapy)    Equipment Recommendations  Other (comment)(TBD at next venue of care)    Recommendations for Other Services      Precautions / Restrictions Precautions Precautions: Fall Precaution Comments: R BKA Required  Braces or Orthoses: Other Brace(right prosthetic leg) Restrictions Weight Bearing Restrictions: No       Mobility Bed Mobility                  Transfers Overall transfer level: Needs assistance Equipment used: Rolling walker (2 wheeled) Transfers: Sit to/from Stand Sit to Stand: Supervision         General transfer comment: Pt able to ambulate to/from bathroom and transfer on/off toilet with supervision. Noted 0 instances of LOB.    Balance Overall balance assessment: Needs assistance   Sitting balance-Leahy Scale: Good       Standing balance-Leahy Scale: Fair                             ADL either performed or assessed with clinical judgement   ADL Overall ADL's : Needs assistance/impaired Eating/Feeding: Independent;Sitting   Grooming: Supervision/safety;Standing;Wash/dry hands               Lower Body Dressing: Set up;Sit to/from stand;Sitting/lateral leans   Toilet Transfer: Supervision/safety;Ambulation;Regular Toilet;Grab bars   Toileting- Clothing Manipulation and Hygiene: Supervision/safety;Sit to/from stand;Sitting/lateral lean       Functional mobility during ADLs: Supervision/safety;Rolling walker       Vision       Perception     Praxis      Cognition Arousal/Alertness: Awake/alert Behavior During Therapy: WFL for tasks assessed/performed Overall Cognitive Status: Within Functional Limits for tasks assessed  Exercises Exercises: Other exercises Other Exercises Other Exercises: Incentive spirometer x 10 with min cues on technique. Pulling 1051mL Other Exercises: Flutter valve x 10 Other Exercises: Pt required cues throughout on sequence of pursed lip breathing.   Shoulder Instructions       General Comments Continued education with pt on safety strategies, activity modifications, energy conservation, and breathing exercises. Educated/instructed pt on  [facility] exercises with RUE noting good understanding and follow through. Pt on 2L [place] throughout session. Desat following toileting task to 66% with mod seated rest break to return to 93%. Pt reporting min to mod shortness of breath. 2/4 DOE.    Pertinent Vitals/ Pain       Pain Assessment: No/denies pain  Home Living                                          Prior Functioning/Environment              Frequency  Min 3X/week        Progress Toward Goals  OT Goals(current goals can now be found in the care plan section)  Progress towards OT goals: Progressing toward goals(Goals have been reviewed and updated)  Acute Rehab OT Goals Patient Stated Goal: To go to rehab today Time For Goal Achievement: 05/16/19 Potential to Achieve Goals: Good ADL Goals Pt Will Perform Lower Body Bathing: with modified independence;sit to/from stand Pt Will Perform Lower Body Dressing: with modified independence;sit to/from stand Pt Will Perform Toileting - Clothing Manipulation and hygiene: with modified independence Pt Will Perform Tub/Shower Transfer: with modified independence;ambulating;Shower transfer;3 in 1 Pt/caregiver will Perform Home Exercise Program: Independently;With theraband;Increased strength;Both right and left upper extremity;With written HEP provided  Plan Discharge plan remains appropriate    Co-evaluation                 AM-PAC OT "6 Clicks" Daily Activity     Outcome Measure   Help from another person eating meals?: None Help from another person taking care of personal grooming?: A Little Help from another person toileting, which includes using toliet, bedpan, or urinal?: A Little Help from another person bathing (including washing, rinsing, drying)?: A Little Help from another person to put on and taking off regular upper body clothing?: A Little Help from another person to put on and taking off regular lower body clothing?: A Little 6 Click  Score: 19    End of Session Equipment Utilized During Treatment: Rolling walker;Oxygen  OT Visit Diagnosis: Muscle weakness (generalized) (M62.81)   Activity Tolerance Patient limited by fatigue(Limited by SOB)   Patient Left in chair;with call bell/phone within reach   Nurse Communication Mobility status        Time: [id number] OT Time Calculation (min): 59 min  Charges: OT General Charges $OT Visit: 1 Visit OT Treatments $Self Care/Home Management : 23-37 mins $Therapeutic Activity: 8-22 mins $Therapeutic Exercise: 8-22 mins  Mauri Brooklyn OTR/L [phone]    Mauri Brooklyn 05/02/2019, 1:48 PM

## 2019-05-02 NOTE — Progress Notes (Signed)
PT Cancellation Note  Patient Details Name: [name] MRN: [id number] DOB: [birth year]   Cancelled Treatment:    Reason Eval/Treat Not Completed: Fatigue/lethargy limiting ability to participate having just finished ambulating in room with nurse. Will follow-up for treatment session.  Mabeline Caras, PT, DPT Acute Rehabilitation Services  Pager [phone] Office [place] 05/02/2019, 3:11 PM

## 2019-05-02 NOTE — Plan of Care (Signed)
  Problem: Education: Goal: Knowledge of risk factors and measures for prevention of condition will improve 05/02/2019 1213 by Orvan Falconer, RN Outcome: Progressing 05/02/2019 1213 by Orvan Falconer, RN Outcome: Progressing   Problem: Coping: Goal: Psychosocial and spiritual needs will be supported 05/02/2019 1213 by Orvan Falconer, RN Outcome: Progressing 05/02/2019 1213 by Orvan Falconer, RN Outcome: Progressing   Problem: Respiratory: Goal: Will maintain a patent airway 05/02/2019 1213 by Orvan Falconer, RN Outcome: Progressing 05/02/2019 1213 by Orvan Falconer, RN Outcome: Progressing Goal: Complications related to the disease process, condition or treatment will be avoided or minimized 05/02/2019 1213 by Orvan Falconer, RN Outcome: Progressing 05/02/2019 1213 by Orvan Falconer, RN Outcome: Progressing   Problem: Education: Goal: Knowledge of General Education information will improve Description: Including pain rating scale, medication(s)/side effects and non-pharmacologic comfort measures 05/02/2019 1213 by Orvan Falconer, RN Outcome: Progressing 05/02/2019 1213 by Orvan Falconer, RN Outcome: Progressing   Problem: Health Behavior/Discharge Planning: Goal: Ability to manage health-related needs will improve 05/02/2019 1213 by Orvan Falconer, RN Outcome: Progressing 05/02/2019 1213 by Orvan Falconer, RN Outcome: Progressing   Problem: Clinical Measurements: Goal: Ability to maintain clinical measurements within normal limits will improve 05/02/2019 1213 by Orvan Falconer, RN Outcome: Progressing 05/02/2019 1213 by Orvan Falconer, RN Outcome: Progressing Goal: Will remain free from infection 05/02/2019 1213 by Orvan Falconer, RN Outcome: Progressing 05/02/2019 1213 by Orvan Falconer, RN Outcome: Progressing Goal: Diagnostic test results will improve 05/02/2019 1213 by Orvan Falconer, RN Outcome:  Progressing 05/02/2019 1213 by Orvan Falconer, RN Outcome: Progressing Goal: Respiratory complications will improve 05/02/2019 1213 by Orvan Falconer, RN Outcome: Progressing 05/02/2019 1213 by Orvan Falconer, RN Outcome: Progressing Goal: Cardiovascular complication will be avoided 05/02/2019 1213 by Orvan Falconer, RN Outcome: Progressing 05/02/2019 1213 by Orvan Falconer, RN Outcome: Progressing   Problem: Activity: Goal: Risk for activity intolerance will decrease 05/02/2019 1213 by Orvan Falconer, RN Outcome: Progressing 05/02/2019 1213 by Orvan Falconer, RN Outcome: Progressing   Problem: Nutrition: Goal: Adequate nutrition will be maintained 05/02/2019 1213 by Orvan Falconer, RN Outcome: Progressing 05/02/2019 1213 by Orvan Falconer, RN Outcome: Progressing   Problem: Coping: Goal: Level of anxiety will decrease 05/02/2019 1213 by Orvan Falconer, RN Outcome: Progressing 05/02/2019 1213 by Orvan Falconer, RN Outcome: Progressing   Problem: Elimination: Goal: Will not experience complications related to bowel motility 05/02/2019 1213 by Orvan Falconer, RN Outcome: Progressing 05/02/2019 1213 by Orvan Falconer, RN Outcome: Progressing Goal: Will not experience complications related to urinary retention 05/02/2019 1213 by Orvan Falconer, RN Outcome: Progressing 05/02/2019 1213 by Orvan Falconer, RN Outcome: Progressing   Problem: Pain Managment: Goal: General experience of comfort will improve 05/02/2019 1213 by Orvan Falconer, RN Outcome: Progressing 05/02/2019 1213 by Orvan Falconer, RN Outcome: Progressing   Problem: Safety: Goal: Ability to remain free from injury will improve 05/02/2019 1213 by Orvan Falconer, RN Outcome: Progressing 05/02/2019 1213 by Orvan Falconer, RN Outcome: Progressing   Problem: Skin Integrity: Goal: Risk for impaired skin integrity will decrease 05/02/2019 1213 by  Orvan Falconer, RN Outcome: Progressing 05/02/2019 1213 by Orvan Falconer, RN Outcome: Progressing

## 2019-05-02 NOTE — Progress Notes (Signed)
PROGRESS NOTE  [name]  [name] DOB: 08/06/1955 DOA: 04/08/2019 PCP: [name], FNP  Brief Narrative: [name] is a 63 y.o. male with a history of HTN, HLD, and right BKA who presented 11/15 with fever and cough persisting despite antibiotics and prednisone as an outpatient. He was found to be hypoxic with left upper lobe infiltrate and SARS-CoV-2 PCR positive. Remdesivir and steroids were given. Due to persistent hypoxemia, convalescent plasma and tocilizumab were administered and CTA chest performed. This demonstrated segmental and subsegmental pulmonary emboli within the RLL. Therapeutic anticoagulation has been given, though the patient's hypoxemia continues with slow recovery. Lasix has been given intermittently and antibiotics were started when CXR demonstrated a possible bacterial coinfection 11/25.   Assessment & Plan: Principal Problem:   Pneumonia due to COVID-19 virus Active Problems:   Acute respiratory failure with hypoxia (HCC)   Essential hypertension   HLD (hyperlipidemia)   Community acquired pneumonia   Transaminitis   Hyperglycemia   AKI (acute kidney injury) ([place])   Bilateral pulmonary embolism (HCC)   Lobar pneumonia (HCC)  Acute hypoxic respiratory failure: Multifactorial, due to covid-19 pneumonia and acute PE as well as possible superimposed bacterial pneumonia. Improving.  - Wean O2 as able. Currently on 2L O2.   Covid-19 pneumonia: Now almost a month out from positive test. D/w ID, Dr. Baxter Flattery, that the patient has symptomatically improved greatly. No longer requires proning, etc. She agrees that contact and airborne isolation precautions are no longer indicated.  - This patient may transfer to [facility] without covid precautions, continue universal masking, etc. - CRP normalized, received tocilizumab and convalescent plasma 11/17. Also received abx and therapeutic coagulation. CXR 12/4 stable.    Acute LLE DVT and PE:  - Continue therapeutic  anticoagulation, xarelto  Acute left MCA territory CVA: Seen on MRI 12/8. D/w neurology, Dr. Cheral Marker, who will consult on the patient upon arrival at [facility] for further evaluation.  - Continue aspirin, anticoagulation, statin, and risk factor modification.  - Check lipids, carotid U/S, repeat echo (previous study performed prior to symptoms) - PT/OT already on board.  Thrombocytopenia: HIT Ab negative, SRA negative, immature platelet fraction elevated consistent with adequate marrow response, and indeed this has improved. Case was discussed with Dr. Irene Limbo.   T2DM with steroid-induced hyperglycemia: HbA1c 7.1%.  - Continue SSI, improving as steroids have been tapered  HLD:  - Continue statin  BPH:  - Continue flomax  Obesity: BMI 37. Noted.  AKI on stage I CKD: Resolved AKI.  - Control risk factors.  Suspected gout flare: 1st L MTP - Continue prednisone taper.   DVT prophylaxis: Xarelto Code Status: Full Family Communication: None at bedside. Disposition Plan: Transfer to [facility] for neurology evaluation, stroke work up. Planned disposition following this is CIR.  Consultants:   Hematology, Dr. Irene Limbo by phone only  ID, Dr. Baxter Flattery by phone only  Neurology, Dr. Cheral Marker  Procedures:   None  Antimicrobials:  CTX  Remdesivir   Subjective: Reports significant improvement in shortness of breath, now on 2L O2 with exertion causing dyspnea, though functional capacity slowly improving. Still very limited. No bleeding/bruising. No chest pain. Right hand apraxia seems slightly improved per pt and OT.  Objective: Vitals:   05/02/19 0500 05/02/19 0752 05/02/19 1141 05/02/19 1520  BP:  118/69 123/79 118/79  Pulse:  83 100 (!) 103  Resp:  18 (!) 22 20  Temp:  (!) 97.5 F (36.4 C) 98.2 F (36.8 C) (!) 97.4 F (36.3 C)  TempSrc:  Oral Oral Oral  SpO2:  98% 96% 95%  Weight: 88.1 kg     Height:        Intake/Output Summary (Last 24 hours) at 05/02/2019 1605 Last data filed at  05/02/2019 1404 Gross per 24 hour  Intake 610 ml  Output 800 ml  Net -190 ml   Filed Weights   04/30/19 0430 05/01/19 0455 05/02/19 0500  Weight: 88 kg 88 kg 88.1 kg   Gen: 63 y.o. male in no distress Pulm: Nonlabored breathing 2L O2. Diminished, no crackles. CV: Regular rate and rhythm. No murmur, rub, or gallop. No JVD, no dependent edema. GI: Abdomen soft, non-tender, non-distended, with normoactive bowel sounds.  Ext: Warm, R BKA w/prosthesis in place Skin: No new rashes, lesions or ulcers on visualized skin. Neuro: Alert and oriented. Right hand strength 5/5, sensation intact, definite apraxia without other focal neurological deficits. Psych: Judgement and insight appear fair. Mood euthymic & affect congruent. Behavior is appropriate.    Data Reviewed: I have personally reviewed following labs and imaging studies  CBC: Recent Labs  Lab 04/28/19 0141 04/29/19 0322 04/30/19 0740 05/01/19 0341 05/02/19 0610  WBC 10.3 10.8* 9.7 10.2 9.5  NEUTROABS 7.2 7.8* 6.7 7.2 6.5  HGB 15.2 14.8 15.2 14.8 15.0  HCT 46.0 44.8 46.2 45.1 46.0  MCV 95.2 93.9 94.9 94.7 96.4  PLT 79* 100* 121* 159 [id number]   Basic Metabolic Panel: Recent Labs  Lab 04/28/19 0141 04/29/19 0322 04/30/19 0740 05/01/19 0341 05/02/19 0610  NA 138 137 137 136 136  K 4.4 4.2 4.7 4.6 4.8  CL 96* 91* 91* 93* 93*  CO2 30 33* 33* 32 33*  GLUCOSE 114* 115* 123* 100* 110*  BUN 19 22 21 21 19   CREATININE 0.84 1.05 0.98 0.87 0.89  CALCIUM 8.9 8.8* 9.0 8.9 8.9  MG 2.1 2.2 2.1 2.2 2.2   Liver Function Tests: Recent Labs  Lab 04/28/19 0141 04/29/19 0322 04/30/19 0740 05/01/19 0341 05/02/19 0610  AST 38 40 42* 47* 36  ALT 51* 64* 77* 82* 80*  ALKPHOS 70 72 71 71 68  BILITOT 1.2 1.5* 1.6* 1.9* 1.5*  PROT 6.9 7.1 7.2 7.2 6.9  ALBUMIN 3.4* 3.7 3.6 3.8 3.6   CBG: Recent Labs  Lab 05/01/19 1130 05/01/19 1606 05/01/19 1928 05/02/19 0758 05/02/19 1139  GLUCAP 255* 216* 223* 98 193*   Anemia Panel: Recent  Labs    05/01/19 0341 05/02/19 0610  FERRITIN 280 271   Urine analysis:    Component Value Date/Time   BILIRUBINUR negative 12/28/2018 1717   PROTEINUR Negative 12/28/2018 1717   UROBILINOGEN 0.2 12/28/2018 1717   NITRITE negative 12/28/2018 1717   LEUKOCYTESUR Negative 12/28/2018 1717   Scheduled Meds: . aspirin  81 mg Oral Daily  . atorvastatin  40 mg Oral q1800  . chlorpheniramine-HYDROcodone  5 mL Oral Q12H  . famotidine  20 mg Oral BID  . fluticasone  2 spray Each Nare BID  . insulin aspart  0-9 Units Subcutaneous TID WC  . Ipratropium-Albuterol  1 puff Inhalation Q6H  . predniSONE  20 mg Oral Q breakfast   Followed by  . [START ON 05/05/2019] predniSONE  10 mg Oral Q breakfast  . Rivaroxaban  15 mg Oral BID WC  . [START ON 05/03/2019] rivaroxaban  20 mg Oral Q supper  . sodium chloride flush  10-40 mL Intracatheter Q12H  . tamsulosin  0.4 mg Oral QPC supper  . ursodiol  300 mg Oral BID  .  vitamin C  500 mg Oral Daily  . zinc sulfate  220 mg Oral Daily   Continuous Infusions:    LOS: 23 days   Time spent: 35 minutes.  Patrecia Pour, MD [facility] [email or web address] 05/02/2019, 4:05 PM

## 2019-05-03 ENCOUNTER — Inpatient Hospital Stay (HOSPITAL_COMMUNITY): Payer: PRIVATE HEALTH INSURANCE

## 2019-05-03 DIAGNOSIS — I63512 Cerebral infarction due to unspecified occlusion or stenosis of left middle cerebral artery: Secondary | ICD-10-CM

## 2019-05-03 DIAGNOSIS — I82409 Acute embolism and thrombosis of unspecified deep veins of unspecified lower extremity: Secondary | ICD-10-CM

## 2019-05-03 DIAGNOSIS — J1289 Other viral pneumonia: Secondary | ICD-10-CM

## 2019-05-03 LAB — GLUCOSE, CAPILLARY
Glucose-Capillary: 96 mg/dL (ref 70–99)
Glucose-Capillary: 126 mg/dL — ABNORMAL HIGH (ref 70–99)
Glucose-Capillary: 127 mg/dL — ABNORMAL HIGH (ref 70–99)
Glucose-Capillary: 193 mg/dL — ABNORMAL HIGH (ref 70–99)

## 2019-05-03 LAB — LIPID PANEL
Cholesterol: 109 mg/dL (ref 0–200)
HDL: 46 mg/dL (ref >40)
LDL Cholesterol: 54 mg/dL (ref 0–99)
Total CHOL/HDL Ratio: 2.4 RATIO
Triglycerides: 46 mg/dL (ref <150)
VLDL: 9 mg/dL (ref 0–40)

## 2019-05-03 LAB — HEMOGLOBIN A1C
Hgb A1c MFr Bld: 6.8 % — ABNORMAL HIGH (ref 4.8–5.6)
Mean Plasma Glucose: 148.46 mg/dL

## 2019-05-03 LAB — ECHOCARDIOGRAM LIMITED
Height: 66 in
Weight: 3106.19 oz

## 2019-05-03 MED ORDER — IOHEXOL 350 MG/ML SOLN
75.0000 mL | Freq: Once | INTRAVENOUS | Status: AC | PRN
  Administered 2019-05-03: 75 mL via INTRAVENOUS

## 2019-05-03 NOTE — Progress Notes (Signed)
Inpatient Rehab Admissions:  Inpatient Rehab Consult received.  I met with patient at the bedside for rehabilitation assessment and to discuss goals and expectations of an inpatient rehab admission.  We discussed his high level of mobility (supervision with OT on 12/9), and continued challenges with activity tolerance, specifically his desaturations with mobility and declining PT after working with OT.  He and I agree that he will not be able to tolerate 3 hours of therapy each day at his current level, and that he is doing well enough with his mobility to discharge home with f/u from home health therapies once he is medically stable.  He understands that he will likely need to d/c on supplemental O2 and will need 24/7 supervision at home.  I will sign off at this time.    Signed: Shann Medal, PT, DPT Admissions Coordinator [phone] 05/03/19  11:00 AM

## 2019-05-03 NOTE — Progress Notes (Signed)
STROKE TEAM PROGRESS NOTE   INTERVAL HISTORY His RN and vascular tech is at the bedside.  Pt reports he first noted weakness in his hand, difficulties with fine motor movement in his hands on. last Tuesday.  MRI shows a subacute left MCA branch infarct.  He has been on Xarelto for anticoagulation for DVT related to his recent Covid infection related hypercoagulability.  He is currently off isolation precautions as it has been more than 2 weeks since his illness.  He denies any prior history of strokes or TIAs.  He states he has history of sleep apnea and was prescribed CPAP but he has not been taking it. Vitals:   05/02/19 2312 05/03/19 0357 05/03/19 0829 05/03/19 0918  BP: 133/73 134/90 139/81   Pulse: 95 96 96 96  Resp: (!) 25 20 16 18   Temp: 97.7 F (36.5 C) 97.9 F (36.6 C) 97.9 F (36.6 C)   TempSrc: Oral Oral Oral   SpO2: 95% 91% 92% 92%  Weight:      Height:        CBC:  Recent Labs  Lab 05/01/19 0341 05/02/19 0610  WBC 10.2 9.5  NEUTROABS 7.2 6.5  HGB 14.8 15.0  HCT 45.1 46.0  MCV 94.7 96.4  PLT 159 [id number]    Basic Metabolic Panel:  Recent Labs  Lab 05/01/19 0341 05/02/19 0610  NA 136 136  K 4.6 4.8  CL 93* 93*  CO2 32 33*  GLUCOSE 100* 110*  BUN 21 19  CREATININE 0.87 0.89  CALCIUM 8.9 8.9  MG 2.2 2.2   Lipid Panel:     Component Value Date/Time   CHOL 109 05/03/2019 0432   CHOL 189 12/28/2018 1403   TRIG 46 05/03/2019 0432   HDL 46 05/03/2019 0432   HDL 40 12/28/2018 1403   CHOLHDL 2.4 05/03/2019 0432   VLDL 9 05/03/2019 0432   LDLCALC 54 05/03/2019 0432   LDLCALC 122 (H) 12/28/2018 1403   HgbA1c:  Lab Results  Component Value Date   HGBA1C 6.8 (H) 05/03/2019   Urine Drug Screen: No results found for: LABOPIA, COCAINSCRNUR, LABBENZ, AMPHETMU, THCU, LABBARB  Alcohol Level No results found for: ETH  IMAGING MR BRAIN WO CONTRAST  Result Date: 05/01/2019 CLINICAL DATA:  Focal neuro deficit, > 6 hrs, stroke suspected EXAM: MRI HEAD WITHOUT  CONTRAST TECHNIQUE: Multiplanar, multiecho pulse sequences of the brain and surrounding structures were obtained without intravenous contrast. COMPARISON:  None. FINDINGS: Brain: There is multifocal abnormal diffusion restriction within the left MCA territory. The largest area is at the base of the left central sulcus. There is a single contralateral punctate focus of reduced diffusion in the anterior right parietal white matter. Minimal chronic white matter hyperintensity. Brain volume is normal. No hydrocephalus. There are no old infarcts. No acute or chronic hemorrhage. The midline structures are normal. Vascular: Flow voids are maintained. Skull and upper cervical spine: The bone marrow signal of the cranium and upper cervical vertebrae is normal. There is no skull base lesion. The visualized upper cervical spinal cord is normal. Sinuses/Orbits: There is no paranasal sinus fluid level or advanced mucosal thickening. There is no mastoid or middle ear effusion. The orbits are normal. Other: None IMPRESSION: 1. Multifocal acute ischemia within the left MCA territory, with largest area at the base of the left central sulcus. No hemorrhage or mass effect. 2. Single punctate focus of acute ischemia within the anterior right parietal white matter. Electronically Signed   By: Ulyses Jarred  M.D.   On: 05/01/2019 22:10   ECHOCARDIOGRAM LIMITED  Result Date: 05/03/2019   ECHOCARDIOGRAM LIMITED REPORT   Patient Name:   [name] Date of Exam: 05/03/2019 Medical Rec #:  [id number]       Height:       66.0 in Accession #:    [id number]      Weight:       194.1 lb Date of Birth:  [birth year]       BSA:          1.97 m Patient Age:    64 years        BP:           134/90 mmHg Patient Gender: M               HR:           94 bpm. Exam Location:  Inpatient  Procedure: Limited Echo, Cardiac Doppler and Limited Color Doppler Indications:    stroke 434.91  History:        Patient has prior history of Echocardiogram  examinations, most                 recent 04/10/2019.  Sonographer:    Johny Chess Referring Phys: [id number] Patrecia Pour  Sonographer Comments: Image acquisition challenging due to respiratory motion. IMPRESSIONS  1. Left ventricular ejection fraction, by visual estimation, is 60 to 65%. The left ventricle has normal function. There is no left ventricular hypertrophy.  2. Left ventricular diastolic parameters are consistent with Grade I diastolic dysfunction (impaired relaxation).  3. The left ventricle has no regional wall motion abnormalities.  4. Global right ventricle has normal systolic function.The right ventricular size is normal.  5. Left atrial size was normal.  6. Right atrial size was normal.  7. The mitral valve is normal in structure. No evidence of mitral valve regurgitation. No evidence of mitral stenosis.  8. The tricuspid valve is normal in structure. Tricuspid valve regurgitation is not demonstrated.  9. The aortic valve is normal in structure. Aortic valve regurgitation is mild. Mild aortic valve sclerosis without stenosis. 10. The pulmonic valve was normal in structure. Pulmonic valve regurgitation is not visualized. 11. Normal LV systolic function; grade 1 diastolic dysfunction; mildly sclerotic aortic valve with mild AI. FINDINGS  Left Ventricle: Left ventricular ejection fraction, by visual estimation, is 60 to 65%. The left ventricle has normal function. The left ventricle has no regional wall motion abnormalities. There is no left ventricular hypertrophy. Left ventricular diastolic parameters are consistent with Grade I diastolic dysfunction (impaired relaxation). Normal left atrial pressure. Right Ventricle: The right ventricular size is normal.Global RV systolic function is has normal systolic function. Left Atrium: Left atrial size was normal in size. Right Atrium: Right atrial size was normal in size Pericardium: There is no evidence of pericardial effusion. Mitral Valve: The mitral  valve is normal in structure. No evidence of mitral valve stenosis by observation. MV Area by PHT, 3.17 cm. MV PHT, 69.31 msec. No evidence of mitral valve regurgitation. Tricuspid Valve: The tricuspid valve is normal in structure. Tricuspid valve regurgitation is not demonstrated. Aortic Valve: The aortic valve is normal in structure. Aortic valve regurgitation is mild. Mild aortic valve sclerosis is present, with no evidence of aortic valve stenosis. Pulmonic Valve: The pulmonic valve was normal in structure. Pulmonic valve regurgitation is not visualized. Aorta: The aortic root, ascending aorta and aortic arch are all structurally normal, with no evidence of dilitation  or obstruction. Venous: The inferior vena cava was not well visualized.   LEFT VENTRICLE          Normals PLAX 2D LVIDd:         3.87 cm  3.6 cm   Diastology                  Normals LVIDs:         2.75 cm  1.7 cm   LV e' lateral:   10.80 cm/s 6.42 cm/s LV PW:         1.04 cm  1.4 cm   LV E/e' lateral: 9.4        15.4 LV IVS:        1.00 cm  1.3 cm   LV e' medial:    7.29 cm/s  6.96 cm/s LVOT diam:     2.40 cm  2.0 cm   LV E/e' medial:  13.9       6.96 LV SV:         36 ml    79 ml LV SV Index:   17.74    45 ml/m2 LVOT Area:     4.52 cm 3.14 cm2  LEFT ATRIUM         Index LA diam:    3.50 cm 1.77 cm/m  AORTIC VALVE             Normals LVOT Vmax:   116.00 cm/s LVOT Vmean:  77.600 cm/s 75 cm/s LVOT VTI:    0.236 m     25.3 cm  AORTA                 Normals Ao Root diam: 3.30 cm 31 mm Ao Asc diam:  3.40 cm 31 mm MITRAL VALVE              Normals MV Area (PHT): 3.17 cm              SHUNTS MV PHT:        69.31 msec 55 ms      Systemic VTI:  0.24 m MV Decel Time: 239 msec   187 ms     Systemic Diam: 2.40 cm MV E velocity: 101.00 cm/s 103 cm/s MV A velocity: 115.00 cm/s 70.3 cm/s MV E/A ratio:  0.88        1.5  Kirk Ruths MD Electronically signed by Kirk Ruths MD Signature Date/Time: 05/03/2019/9:22:25 AM    Final     PHYSICAL EXAM Mildly  obese middle-aged African-American male not in distress he is on 2 L nasal cannula oxygen.  . Afebrile. Head is nontraumatic. Neck is supple without bruit.    Cardiac exam no murmur or gallop. Lungs are clear to auscultation. Distal pulses are well felt. Neurological Exam ;  Awake  Alert oriented x 3. Normal speech and language.eye movements full without nystagmus.fundi were not visualized. Vision acuity and fields appear normal. Hearing is normal. Palatal movements are normal. Face symmetric. Tongue midline. Normal strength, tone, reflexes and coordination.  Diminished fine finger movements on the right.  Mild weakness of right grip.  Orbits left over right upper extremity.  Normal sensation. Gait deferred. NIH stroke scale 0 Premorbid modified Rankin 2-Covid illness  ASSESSMENT/PLAN [name] is a 63 y.o. male with history of right BKA, DM2, gout, stage I CKD, BKA, HTN, HLD and hospitalization at [facility] for Covid PNA on 11/16 w/ respiratory failure, PE, LLE DVT and possible bacterial PNA, gout on  prednisone, thrombocytopenia w/ neg HIT, transferred to [facility] 12/9 for evaluation of a subacute L MCA territory infarct. He had new onset R hand weakness 1 week ago, that has improved. He is on xarelto, aspirin and statin.  Stroke:   Subacute L MCA territory infarcts possibly from, hypercoagulable state in setting of COVID-19 w/ PE and DVT, though also need to rule out paradoxical embolism  CT head 11/24 negative   MRI 12/8  Multifocal R MCA territory infarcts and single punctate anterior R parietal infarct.  CTA head & neck no significant large vessel stenosis or occlusion or atherosclerotic disease.  Carotid Doppler  No significant stenosis   2D Echo EF 60-65%. No source of embolus   TCD w/ bubble pending  LE doppler R ok, L DVT posterior tibial, L peroneal veins w/ new clot progression (previous in L posterior tib, now extends to L peroneal)  LDL 54  HgbA1c  6.8  Xarelto for VTE prophylaxis  On aspirin 81 PTA, now on Xarelto (rivaroxaban) daily.   Therapy recommendations:  Home health (not able to tolerate 3h therapy give respiratory illness)  Disposition:  Plan return home  COVID PNA/Acute Respiratory Failure w/ Hypoxia  Acute LLE DVT and PE  Felt to be due to hypercoagulable state during COVID infection  On xarelto  Hypertension  Stable . BP goal normotensive  Hyperlipidemia  Home meds:  lipitor 20  Now on lipitor 40  LDL 54, goal < 70  Continue statin at discharge  Diabetes type II Controlled  HgbA1c 6.8, goal < 7.0  Other Stroke Risk Factors  Obesity, Body mass index is 31.33 kg/m., recommend weight loss, diet and exercise as appropriate   Hx R BKA  Other Active Problems  AKI 1.46->0.89  BPH on flomax  Hospital day # 24  He presented with subacute right hand weakness due to small left MCA branch infarct possibly related to recent Covid illness hypercoagulability despite being on anticoagulation with Xarelto for recent DVT and pulmonary embolism.  Possibility of paradoxical embolism through PFO is also a consideration and will check TCD bubble study for the same.  Recommend changing anticoagulation from Xarelto to either full dose Lovenox or warfarin with Lovenox bridging after discussion with hematology and ID team.  Aggressive risk factor modification.  Patient may also consider possible participation in the sleep smart stroke prevention trial for sleep apnea.  He will be given written information to review and decide.  Discussed with Dr. Wynelle Cleveland.  Greater than 50% time during this 35-minute visit was spent on counseling and coordination of care about his embolic stroke and hypercoagulability from Covid illness and discussion about sleep apnea and answering questions. [name] Contras, MD  To contact Stroke Continuity provider, please refer to [email or web address] After hours, contact General Neurology

## 2019-05-03 NOTE — Progress Notes (Signed)
Physical Therapy Treatment Patient Details Name: [name] MRN: [id number] DOB: [birth year] Today's Date: 05/03/2019    History of Present Illness 63 y/o male w/ hx liver disorder, HTN, HLD, R BKA, GERD presented to ED with SOB, cough, fever and chills, admitted with dx acute hypoxic resp failure sec to SARS COVID 19. Follow-up CT chest showed segmental and subsegmental PE within branches of the right lower lobe. Patient noted to have rt hand weakness and incoordination; head CT 04/17/19 showing no acute changes. 04/23/19 pt reports no improvement and no worsening of rt hand. MRI 12/8 with multifocal acute ischemia in L MCA territory.    PT Comments    Pt progressing steadily towards physical therapy goals. Able to participate in seated exercises with focus on increased repetitions to promote endurance and limited ambulation with walker. Performing functional mobility at a min guard assist level. SpO2 96% on 3L O2 seated in chair, desaturation to 79% after walking 8 feet. Demonstrates good pursed lip breathing technique. Will continue to progress as tolerated. D/c plan updated.    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) Required Braces or Orthoses: Other Brace Other Brace: prosthetic leg, congenital Restrictions Weight Bearing Restrictions: No    Mobility  Bed Mobility Overal bed mobility: Modified Independent                Transfers Overall transfer level: Needs assistance Equipment used: Rolling walker (2 wheeled) Transfers: Sit to/from Stand Sit to Stand: Min guard Stand pivot transfers: Supervision       General transfer comment: P  Ambulation/Gait Ambulation/Gait assistance: Min  guard Gait Distance (Feet): 8 Feet Assistive device: Rolling walker (2 wheeled) Gait Pattern/deviations: Decreased stride length Gait velocity: decreased Gait velocity interpretation: <1.8 ft/sec, indicate of risk for recurrent falls General Gait Details: slow, steady pace. no overt LOB   Stairs             Wheelchair Mobility    Modified Rankin (Stroke Patients Only) Modified Rankin (Stroke Patients Only) Pre-Morbid Rankin Score: No symptoms Modified Rankin: Moderately severe disability     Balance Overall balance assessment: Needs assistance Sitting-balance support: Feet supported Sitting balance-Leahy Scale: Normal Sitting balance - Comments: able to don prosthesis without LOB    Standing balance support: Bilateral upper extremity supported;During functional activity Standing balance-Leahy Scale: Fair Standing balance comment: requires UE support intermittently, but need increases as he fatigues                             Cognition Arousal/Alertness: Awake/alert Behavior During Therapy: WFL for tasks assessed/performed Overall Cognitive Status: Impaired/Different from baseline Area of Impairment: Problem solving                     Memory: Decreased short-term memory       Problem Solving: Slow processing;Requires verbal cues General Comments: Pt with delayed processing with motor initiation; requires cues       Exercises General Exercises - Lower Extremity Long Arc Quad: Both;15 reps;Seated Hip Flexion/Marching: Both;Seated;20 reps Heel Raises: Left;15 reps;Seated    General Comments General comments (skin integrity, edema, etc.): Pt able to tolerate 4 mins static standing before fatiguing, then ambulated  around bed, to the recliner.  Sats decreased to 80% at end of session and required 90 seconds to rebound to 91% with pt performing pursed lip breathing on 3L supplemental 02      Pertinent Vitals/Pain Pain Assessment: No/denies  pain    Home Living     Available Help at Discharge: Family Type of Home: House              Prior Function            PT Goals (current goals can now be found in the care plan section) Acute Rehab PT Goals Patient Stated Goal: "work on exercises." Progress towards PT goals: Progressing toward goals    Frequency    Min 3X/week      PT Plan Discharge plan needs to be updated    Co-evaluation              AM-PAC PT "6 Clicks" Mobility   Outcome Measure  Help needed turning from your back to your side while in a flat bed without using bedrails?: None Help needed moving from lying on your back to sitting on the side of a flat bed without using bedrails?: None Help needed moving to and from a bed to a chair (including a wheelchair)?: None Help needed standing up from a chair using your arms (e.g., wheelchair or bedside chair)?: A Little Help needed to walk in hospital room?: A Little Help needed climbing 3-5 steps with a railing? : Total 6 Click Score: 19    End of Session Equipment Utilized During Treatment: Gait belt;Oxygen Activity Tolerance: Patient tolerated treatment well Patient left: in bed;with call bell/phone within reach Nurse Communication: Mobility status PT Visit Diagnosis: Muscle weakness (generalized) (M62.81);Other abnormalities of gait and mobility (R26.89)     Time: [id number] PT Time Calculation (min) (ACUTE ONLY): 30 min  Charges:  $Therapeutic Exercise: 8-22 mins $Therapeutic Activity: 8-22 mins                     Ellamae Sia, PT, DPT Acute Rehabilitation Services Pager [phone] Office [phone]    Willy Eddy 05/03/2019, 4:40 PM

## 2019-05-03 NOTE — Progress Notes (Signed)
PROGRESS NOTE    [name]   [id number]  DOB: 1956-02-26  DOA: 04/08/2019 PCP: [name], FNP   Brief Narrative:  [name] is a 63 year old male with a right BKA, hypertension, hyperlipidemia, diabetes mellitus type 2 who was admitted to [facility] for COVID pneumonia on 11/15. He has been transferred to [facility] as of 12/9 for an acute CVA. His COVID pneumonia was complicated by a DVT, acute PE, thrombocytopenia and superimposed bacterial pneumonia and he has been receiving Xarelto.  His neurological symptoms include right hand weakness. An MRI obtained on 12/8 revealed > Multifocal acute ischemia within the left MCA territory, with largest area at the base of the left central sulcus. No hemorrhage or mass effect. - Single punctate focus of acute ischemia within the anterior right parietal white matter.   Subjective: He has a mild cough with is productive. He feels his dyspnea is improving and he has begun to ambulate but is still very limited by dyspnea on exertion.     Assessment & Plan:   Principal Problem:   Pneumonia due to COVID-19 virus/   Acute respiratory failure with hypoxia - This was treated with steroids, tocilizumab, convalescent plasma and antibiotics - he has been weaned down to 3 L O2 and 20 mg of Prednisone - cont to wean Prednisone  Active Problems:  Acute LLE DVT and PE- he remains intermittently tachycardic and tachypneic - Possibly related to hypercoagulable state (due to [place]) - cont Xarelto    Acute ischemic left MCA stroke  - occurring while on Xarelto & aspirin 81 mg - cont work up including 2 D ECHO, carotid duplex and LE venous duplex - LDL 54 (this was 122 in 12/28/18) - A1c 6.8 - stroke team to follow later today  DM2 with hyperglycemia - A1c is 6.8- cont SSI while in the hospital- previously diet controlled  AKI - Cr rose to 1.46 on 04/08/19 and has subsequently trended down to 0.89   Gout flare of 1st left MTP - improving on  Prednisone  RLE BKA  Essential HTN - Diovan/HCTZ on hold  BPH - cont Flomax   Time spent in minutes: 35 DVT prophylaxis: Xarelto Code Status: Full code Family Communication:  Disposition Plan: f/u on CVA work up Consultants:   ID and hematology by phone  Neurology Procedures:   LE venous duplex Antimicrobials:  Anti-infectives (From admission, onward)   Start     Dose/Rate Route Frequency Ordered Stop   04/19/19 1130  cefTRIAXone (ROCEPHIN) 2 g in sodium chloride 0.9 % 100 mL IVPB     2 g 200 mL/hr over 30 Minutes Intravenous Every 24 hours 04/19/19 0932 04/26/19 1129   04/18/19 1130  cefTRIAXone (ROCEPHIN) 1 g in sodium chloride 0.9 % 100 mL IVPB  Status:  Discontinued     1 g 200 mL/hr over 30 Minutes Intravenous Every 24 hours 04/18/19 1123 04/19/19 0932   04/10/19 1000  remdesivir 100 mg in sodium chloride 0.9 % 250 mL IVPB     100 mg 500 mL/hr over 30 Minutes Intravenous Every 24 hours 04/09/19 1321 04/13/19 1933   04/09/19 2200  cefTRIAXone (ROCEPHIN) 2 g in sodium chloride 0.9 % 100 mL IVPB  Status:  Discontinued     2 g 200 mL/hr over 30 Minutes Intravenous Every 24 hours 04/09/19 0400 04/10/19 0827   04/09/19 2200  azithromycin (ZITHROMAX) 500 mg in sodium chloride 0.9 % 250 mL IVPB  Status:  Discontinued  500 mg 250 mL/hr over 60 Minutes Intravenous Every 24 hours 04/09/19 0400 04/10/19 0827   04/09/19 1400  remdesivir 200 mg in sodium chloride 0.9 % 250 mL IVPB     200 mg 500 mL/hr over 30 Minutes Intravenous Once 04/09/19 1321 04/09/19 1642   04/09/19 0000  cefTRIAXone (ROCEPHIN) 1 g in sodium chloride 0.9 % 100 mL IVPB     1 g 200 mL/hr over 30 Minutes Intravenous  Once 04/08/19 2353 04/09/19 0115   04/09/19 0000  azithromycin (ZITHROMAX) 500 mg in sodium chloride 0.9 % 250 mL IVPB     500 mg 250 mL/hr over 60 Minutes Intravenous  Once 04/08/19 2353 04/09/19 0132       Objective: Vitals:   05/02/19 2027 05/02/19 2045 05/02/19 2312 05/03/19 0357   BP: 133/86  133/73 134/90  Pulse: (!) 110  95 96  Resp: 16  (!) 25 20  Temp: 97.6 F (36.4 C)  97.7 F (36.5 C) 97.9 F (36.6 C)  TempSrc: Oral  Oral Oral  SpO2: 95% 96% 95% 91%  Weight:      Height:        Intake/Output Summary (Last 24 hours) at 05/03/2019 0816 Last data filed at 05/03/2019 0400 Gross per 24 hour  Intake 720 ml  Output 700 ml  Net 20 ml   Filed Weights   04/30/19 0430 05/01/19 0455 05/02/19 0500  Weight: 88 kg 88 kg 88.1 kg    Examination: General exam: Appears comfortable  HEENT: PERRLA, oral mucosa moist, no sclera icterus or thrush Respiratory system: Clear to auscultation. Respiratory effort normal. Cardiovascular system: S1 & S2 heard, RRR.   Gastrointestinal system: Abdomen soft, non-tender, nondistended. Normal bowel sounds. Central nervous system: Alert and oriented. No focal neurological deficits. Extremities: No cyanosis, clubbing or edema Skin: No rashes or ulcers Psychiatry:  Mood & affect appropriate.     Data Reviewed: I have personally reviewed following labs and imaging studies  CBC: Recent Labs  Lab 04/28/19 0141 04/29/19 0322 04/30/19 0740 05/01/19 0341 05/02/19 0610  WBC 10.3 10.8* 9.7 10.2 9.5  NEUTROABS 7.2 7.8* 6.7 7.2 6.5  HGB 15.2 14.8 15.2 14.8 15.0  HCT 46.0 44.8 46.2 45.1 46.0  MCV 95.2 93.9 94.9 94.7 96.4  PLT 79* 100* 121* 159 [id number]   Basic Metabolic Panel: Recent Labs  Lab 04/28/19 0141 04/29/19 0322 04/30/19 0740 05/01/19 0341 05/02/19 0610  NA 138 137 137 136 136  K 4.4 4.2 4.7 4.6 4.8  CL 96* 91* 91* 93* 93*  CO2 30 33* 33* 32 33*  GLUCOSE 114* 115* 123* 100* 110*  BUN 19 22 21 21 19   CREATININE 0.84 1.05 0.98 0.87 0.89  CALCIUM 8.9 8.8* 9.0 8.9 8.9  MG 2.1 2.2 2.1 2.2 2.2   GFR: Estimated Creatinine Clearance: 88.3 mL/min (by C-G formula based on SCr of 0.89 mg/dL). Liver Function Tests: Recent Labs  Lab 04/28/19 0141 04/29/19 0322 04/30/19 0740 05/01/19 0341 05/02/19 0610  AST 38  40 42* 47* 36  ALT 51* 64* 77* 82* 80*  ALKPHOS 70 72 71 71 68  BILITOT 1.2 1.5* 1.6* 1.9* 1.5*  PROT 6.9 7.1 7.2 7.2 6.9  ALBUMIN 3.4* 3.7 3.6 3.8 3.6   No results for input(s): LIPASE, AMYLASE in the last 168 hours. No results for input(s): AMMONIA in the last 168 hours. Coagulation Profile: No results for input(s): INR, PROTIME in the last 168 hours. Cardiac Enzymes: No results for input(s): CKTOTAL, CKMB, CKMBINDEX, TROPONINI in  the last 168 hours. BNP (last 3 results) No results for input(s): PROBNP in the last 8760 hours. HbA1C: Recent Labs    05/03/19 0432  HGBA1C 6.8*   CBG: Recent Labs  Lab 05/02/19 1139 05/02/19 1618 05/02/19 1801 05/02/19 2127 05/03/19 0706  GLUCAP 193* 161* 152* 105* 96   Lipid Profile: Recent Labs    05/03/19 0432  CHOL 109  HDL 46  LDLCALC 54  TRIG 46  CHOLHDL 2.4   Thyroid Function Tests: No results for input(s): TSH, T4TOTAL, FREET4, T3FREE, THYROIDAB in the last 72 hours. Anemia Panel: Recent Labs    05/01/19 0341 05/02/19 0610  FERRITIN 280 271   Urine analysis:    Component Value Date/Time   BILIRUBINUR negative 12/28/2018 1717   PROTEINUR Negative 12/28/2018 1717   UROBILINOGEN 0.2 12/28/2018 1717   NITRITE negative 12/28/2018 1717   LEUKOCYTESUR Negative 12/28/2018 1717   Sepsis Labs: @LABRCNTIP (procalcitonin:4,lacticidven:4) )No results found for this or any previous visit (from the past 240 hour(s)).       Radiology Studies: MR BRAIN WO CONTRAST  Result Date: 05/01/2019 CLINICAL DATA:  Focal neuro deficit, > 6 hrs, stroke suspected EXAM: MRI HEAD WITHOUT CONTRAST TECHNIQUE: Multiplanar, multiecho pulse sequences of the brain and surrounding structures were obtained without intravenous contrast. COMPARISON:  None. FINDINGS: Brain: There is multifocal abnormal diffusion restriction within the left MCA territory. The largest area is at the base of the left central sulcus. There is a single contralateral  punctate focus of reduced diffusion in the anterior right parietal white matter. Minimal chronic white matter hyperintensity. Brain volume is normal. No hydrocephalus. There are no old infarcts. No acute or chronic hemorrhage. The midline structures are normal. Vascular: Flow voids are maintained. Skull and upper cervical spine: The bone marrow signal of the cranium and upper cervical vertebrae is normal. There is no skull base lesion. The visualized upper cervical spinal cord is normal. Sinuses/Orbits: There is no paranasal sinus fluid level or advanced mucosal thickening. There is no mastoid or middle ear effusion. The orbits are normal. Other: None IMPRESSION: 1. Multifocal acute ischemia within the left MCA territory, with largest area at the base of the left central sulcus. No hemorrhage or mass effect. 2. Single punctate focus of acute ischemia within the anterior right parietal white matter. Electronically Signed   By: Ulyses Jarred M.D.   On: 05/01/2019 22:10      Scheduled Meds:  aspirin  81 mg Oral Daily   atorvastatin  40 mg Oral q1800   chlorpheniramine-HYDROcodone  5 mL Oral Q12H   famotidine  20 mg Oral BID   fluticasone  2 spray Each Nare BID   insulin aspart  0-9 Units Subcutaneous TID WC   Ipratropium-Albuterol  1 puff Inhalation TID   predniSONE  20 mg Oral Q breakfast   Followed by   Derrill Memo ON 05/05/2019] predniSONE  10 mg Oral Q breakfast   Rivaroxaban  15 mg Oral BID WC   rivaroxaban  20 mg Oral Q supper   sodium chloride flush  10-40 mL Intracatheter Q12H   tamsulosin  0.4 mg Oral QPC supper   ursodiol  300 mg Oral BID   vitamin C  500 mg Oral Daily   zinc sulfate  220 mg Oral Daily   Continuous Infusions:   LOS: 24 days      Debbe Odea, MD [facility] Pager: [email or web address] Password [email or web address] 05/03/2019, 8:16 AM

## 2019-05-03 NOTE — Progress Notes (Signed)
Occupational Therapy Treatment Patient Details Name: [name] MRN: [id number] DOB: April 27, 1956 Today's Date: 05/03/2019    History of present illness 63 y/o male w/ hx liver disorder, HTN, HLD, R BKA, GERD presented to ED with SOB, cough, fever and chills, admitted with dx acute hypoxic resp failure sec to SARS COVID 19. Follow-up CT chest showed segmental and subsegmental PE within branches of the right lower lobe. Patient noted to have rt hand weakness and incoordination; head CT 04/17/19 showing no acute changes. 04/23/19 pt reports no improvement and no worsening of rt hand. MRI 12/8 with multifocal acute ischemia in L MCA territory.   OT comments  Pt is making steady progress with ADLs and activity tolerance.  He is able to perform ADLs with min A - supervision level for each task independently of the other.  He fatigues rather quickly and requires increased amount of time  complete activity.   02 sats decreased to 80% on 3L supplemental 02, but rebounded to the low 90s in 90 seconds with pursed lip breathing.  Pt reports his wife is discharging from SNF today or tomorrow at mod I level, and his sister will be either staying with them at discharge, or they will go to her house in [place].  Discharge recommendations updated for [facility] OT.    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) Required Braces or Orthoses: Other Brace Other Brace: prosthetic leg, congenital Restrictions Weight Bearing Restrictions: No       Mobility Bed Mobility Overal bed mobility: Modified Independent                Transfers Overall transfer level: Needs assistance Equipment used: Rolling walker (2 wheeled) Transfers: Sit to/from  Stand Sit to Stand: Min guard Stand pivot transfers: Supervision       General transfer comment: P    Balance Overall balance assessment: Needs assistance Sitting-balance support: Feet supported Sitting balance-Leahy Scale: Normal Sitting balance - Comments: able to don prosthesis without LOB    Standing balance support: Bilateral upper extremity supported;During functional activity Standing balance-Leahy Scale: Fair Standing balance comment: requires UE support intermittently, but need increases as he fatigues                            ADL either performed or assessed with clinical judgement   ADL Overall ADL's : Needs assistance/impaired                     Lower Body Dressing: Supervision/safety;Sit to/from stand Lower Body Dressing Details (indicate cue type and reason): supervision for safety.  02 sats decreased to 84% on 3L supplemental 02  Toilet Transfer: Supervision/safety;Ambulation;Comfort height toilet;Grab bars;RW   Toileting- Clothing Manipulation and Hygiene: Supervision/safety;Sit to/from stand       Functional mobility during ADLs: Supervision/safety;Rolling walker General ADL Comments: Pt requires increased time and frequent rest breaks while performing ADLs      Vision       Perception     Praxis      Cognition Arousal/Alertness: Awake/alert Behavior During Therapy: WFL for tasks assessed/performed Overall Cognitive Status: Impaired/Different from baseline Area of Impairment: Problem solving  Memory: Decreased short-term memory       Problem Solving: Slow processing;Requires verbal cues General Comments: Pt with delayed processing with motor initiation; requires cues         Exercises Exercises: General Lower Extremity General Exercises - Lower Extremity Long Arc Quad: Both;15 reps;Seated Hip Flexion/Marching: Both;Seated;20 reps Heel Raises: Left;15 reps;Seated   Shoulder Instructions        General Comments Pt able to tolerate 4 mins static standing before fatiguing, then ambulated around bed, to the recliner.  Sats decreased to 80% at end of session and required 90 seconds to rebound to 91% with pt performing pursed lip breathing on 3L supplemental 02    Pertinent Vitals/ Pain       Pain Assessment: No/denies pain  Home Living     Available Help at Discharge: Family Type of Home: House                              Lives With: Spouse    Prior Functioning/Environment              Frequency  Min 3X/week        Progress Toward Goals  OT Goals(current goals can now be found in the care plan section)  Progress towards OT goals: Progressing toward goals  Acute Rehab OT Goals Patient Stated Goal: "work on exercises."  Plan Discharge plan needs to be updated    Co-evaluation                 AM-PAC OT "6 Clicks" Daily Activity     Outcome Measure   Help from another person eating meals?: None Help from another person taking care of personal grooming?: A Little Help from another person toileting, which includes using toliet, bedpan, or urinal?: A Little Help from another person bathing (including washing, rinsing, drying)?: A Little Help from another person to put on and taking off regular upper body clothing?: A Little Help from another person to put on and taking off regular lower body clothing?: A Little 6 Click Score: 19    End of Session Equipment Utilized During Treatment: Rolling walker  OT Visit Diagnosis: Muscle weakness (generalized) (M62.81)   Activity Tolerance Patient tolerated treatment well   Patient Left in chair;with call bell/phone within reach   Nurse Communication Mobility status        Time: 1123-1210 OT Time Calculation (min): 47 min  Charges: OT General Charges $OT Visit: 1 Visit OT Treatments $Self Care/Home Management : 23-37 mins $Therapeutic Activity: 8-22 mins  Lucille Passy,  OTR/L Acute Rehabilitation Services Pager [phone] Office [phone]    Lucille Passy M 05/03/2019, 5:07 PM

## 2019-05-03 NOTE — Evaluation (Signed)
Speech Language Pathology Evaluation Patient Details Name: [name] MRN: [id number] DOB: 11-21-1955 Today's Date: 05/03/2019 Time: 1450-1520 SLP Time Calculation (min) (ACUTE ONLY): 30 min  Problem List:  Patient Active Problem List   Diagnosis Date Noted  . Acute ischemic left MCA stroke ([place]) 05/02/2019  . Lobar pneumonia ([place])   . Bilateral pulmonary embolism ([place]) 04/10/2019  . Acute respiratory failure with hypoxia ([place]) 04/09/2019  . Essential hypertension 04/09/2019  . HLD (hyperlipidemia) 04/09/2019  . Community acquired pneumonia 04/09/2019  . Pneumonia due to COVID-19 virus 04/09/2019  . Transaminitis 04/09/2019  . Hyperglycemia 04/09/2019  . Severe sepsis ([place]) 04/09/2019  . AKI (acute kidney injury) ([place]) 04/09/2019  . CKD (chronic kidney disease) stage 2, GFR 60-89 ml/min 04/09/2019   Past Medical History:  Past Medical History:  Diagnosis Date  . GERD (gastroesophageal reflux disease)   . 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 Hyperlipidemia   . Hypertension   . Liver disorder    tx with ursodiol    Past Surgical History:  Past Surgical History:  Procedure Laterality Date  . BELOW KNEE LEG AMPUTATION Right   . CHOLECYSTECTOMY    . WISDOM TOOTH EXTRACTION     HPI:  MRI brain 12/8: Multifocal acute ischemia within the left MCA territory, with largest area at the base of the left central sulcus. No hemorrhage or mass effect. Single punctate focus of acute ischemia within the anterior right parietal white matter.   Assessment / Plan / Recommendation Clinical Impression  Pt presents with adequate speech and language function. Higher level cognition appears mildly impaired, with recall of 3/5 unrelated words after delay, difficulty with thought organization task (naming category members) and extended processing time with multiprocess reasoning task. Pt reports he manages household finances and medications independently. He shares responsibilities  with his wife for cooking, cleaning, etc. Pt feels he is able to return to work from a cognitive standpoint. 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 Results were reviewed with pt, and he was encouraged to consider outpatient ST services if needs arise once he returns to home and his normal routines. No further ST intervention is recommended at this level of care.    SLP Assessment  SLP Recommendation/Assessment: All further Speech Language Pathology  needs can be addressed in the next venue of care  SLP Visit Diagnosis: Cognitive communication deficit (R41.841)    Follow Up Recommendations  Outpatient SLP(if needs arise.)       SLP Evaluation Cognition  Overall Cognitive Status: Impaired/Different from baseline Arousal/Alertness: Awake/alert Orientation Level: Oriented X4 Attention: Focused;Sustained Focused Attention: Appears intact Sustained Attention: Appears intact Memory: Impaired Memory Impairment: Retrieval deficit;Decreased recall of new information Comments: Recalled 3/5 words with delay. Difficulty with thought organization task. Required extra time for multiprocess reasoning task       Comprehension  Auditory Comprehension Overall Auditory Comprehension: Appears within functional limits for tasks assessed    Expression Expression Primary Mode of Expression: Verbal Verbal Expression Overall Verbal Expression: Appears within functional limits for tasks assessed   Oral / Motor  Oral Motor/Sensory Function Overall Oral Motor/Sensory Function: Within functional limits Motor Speech Overall Motor Speech: Appears within functional limits for tasks assessed   GO                   [name] B, MSP, CCC-SLP  Speech Language Pathologist Office: [phone] Pager: [phone]  Shonna Chock 05/03/2019, 3:27 PM

## 2019-05-03 NOTE — Progress Notes (Deleted)
SLP Cancellation Note  Patient Details Name: CAIREE LUMBARD MRN: [id number] DOB: [birth year]   Cancelled treatment:       Reason Eval/Treat Not Completed: Medical issues which prohibited therapy   Per pt's nurse, she is asking for another COVID test d/t pt continues to exhibit s/s of COVID. Will defer SLE until test results are in. ST to follow for SLE.    Shareece Bultman 05/03/2019, 9:23 AM

## 2019-05-03 NOTE — Progress Notes (Signed)
  Echocardiogram 2D Echocardiogram has been performed.  [name] 05/03/2019, 8:32 AM

## 2019-05-03 NOTE — Progress Notes (Signed)
VASCULAR LAB PRELIMINARY  PRELIMINARY  PRELIMINARY  PRELIMINARY  Bilateral lower extremity venous duplex completed.    Preliminary report:  See CV proc for preliminary results.  Gave report to Dr. Rolanda Jay, [facility], RVT 05/03/2019, 12:25 PM

## 2019-05-04 ENCOUNTER — Encounter (HOSPITAL_COMMUNITY): Payer: Self-pay | Admitting: Internal Medicine

## 2019-05-04 ENCOUNTER — Inpatient Hospital Stay (HOSPITAL_COMMUNITY): Payer: PRIVATE HEALTH INSURANCE

## 2019-05-04 DIAGNOSIS — U071 COVID-19: Principal | ICD-10-CM

## 2019-05-04 DIAGNOSIS — J1289 Other viral pneumonia: Secondary | ICD-10-CM

## 2019-05-04 LAB — GLUCOSE, CAPILLARY
Glucose-Capillary: 113 mg/dL — ABNORMAL HIGH (ref 70–99)
Glucose-Capillary: 120 mg/dL — ABNORMAL HIGH (ref 70–99)
Glucose-Capillary: 268 mg/dL — ABNORMAL HIGH (ref 70–99)
Glucose-Capillary: 234 mg/dL — ABNORMAL HIGH (ref 70–99)
Glucose-Capillary: 114 mg/dL — ABNORMAL HIGH (ref 70–99)

## 2019-05-04 LAB — HEPARIN LEVEL (UNFRACTIONATED): Heparin Unfractionated: >2.2 IU/mL — ABNORMAL HIGH (ref 0.30–0.70)

## 2019-05-04 LAB — APTT: aPTT: 33 seconds (ref 24–36)

## 2019-05-04 LAB — ANTITHROMBIN III: AntiThromb III Func: 116 % (ref 75–120)

## 2019-05-04 MED ORDER — IPRATROPIUM-ALBUTEROL 20-100 MCG/ACT IN AERS
1.0000 | INHALATION_SPRAY | Freq: Two times a day (BID) | RESPIRATORY_TRACT | Status: DC
  Administered 2019-05-04 – 2019-05-07 (×6): 1 via RESPIRATORY_TRACT
  Filled 2019-05-04: qty 4

## 2019-05-04 MED ORDER — WARFARIN SODIUM 7.5 MG PO TABS
7.5000 mg | ORAL_TABLET | Freq: Once | ORAL | Status: AC
  Administered 2019-05-04: 7.5 mg via ORAL
  Filled 2019-05-04: qty 1

## 2019-05-04 MED ORDER — HEPARIN (PORCINE) 25000 UT/250ML-% IV SOLN
1000.0000 [IU]/h | INTRAVENOUS | Status: DC
  Administered 2019-05-04: 1200 [IU]/h via INTRAVENOUS
  Administered 2019-05-05: 1400 [IU]/h via INTRAVENOUS
  Administered 2019-05-06: 1500 [IU]/h via INTRAVENOUS
  Filled 2019-05-04 (×5): qty 250

## 2019-05-04 MED ORDER — COUMADIN BOOK
Freq: Once | Status: AC
  Administered 2019-05-04: 19:00:00
  Filled 2019-05-04: qty 1

## 2019-05-04 MED ORDER — WARFARIN - PHARMACIST DOSING INPATIENT
Freq: Every day | Status: DC
  Administered 2019-05-05 – 2019-05-06 (×2)

## 2019-05-04 NOTE — Progress Notes (Signed)
ANTICOAGULATION CONSULT NOTE - Initial Consult  Pharmacy Consult for Coumadin Indication: DVT, PE, and CVA  No Known Allergies  Patient Measurements: Height: 5\' 6"  (167.6 cm) Weight: 194 lb 3.6 oz (88.1 kg) IBW/kg (Calculated) : 63.8 Heparin Dosing Weight: 82.3 kg  Vital Signs: Temp: 98.2 F (36.8 C) (12/11 1542) Temp Source: Oral (12/11 1542) BP: 114/72 (12/11 1542) Pulse Rate: 103 (12/11 1542)  Labs: Recent Labs    05/02/19 0610 05/04/19 0835  HGB 15.0  --   HCT 46.0  --   PLT 180  --   APTT  --  33  HEPARINUNFRC  --  >2.20*  CREATININE 0.89  --     Estimated Creatinine Clearance: 88.3 mL/min (by C-G formula based on SCr of 0.89 mg/dL).   Medical History: Past Medical History:  Diagnosis Date  . Diabetes mellitus without complication ([place])   . GERD (gastroesophageal reflux disease)   . 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 Hyperlipidemia   . Hypertension   . Liver disorder    tx with ursodiol     Assessment:  Anticoag: CTA shows PE (04/09/19), mild clot burden, no evidence of right heart strain, +DVT (04/10/19) with progression since on Xarelto, +CVA while on Xarelto. (05/01/19) .  - LMWH>>Xarelto (11/19>>12/10)>>Warfarin (05/04/19)     - H/H:15/46, Plts up to 180 - DDimer > 20 - 6.07 >4.65>>3.9 - HIT Ab wnl, SRA Negative Immature platelet fxn elevated> indicates incr plt production  Goal of Therapy:  INR 2-3 Monitor platelets by anticoagulation protocol: Yes   Plan:  D/c Xarelto 12/11>>IV heparin Start Warfarin 7.5mg  po x 1 tonight. Expect elevated INR initially due to drug/lab interaction with previous Xarelto. Daily INR   [name], PharmD, BCPS Clinical Staff Pharmacist Eilene Ghazi [place] 05/04/2019,6:15 PM

## 2019-05-04 NOTE — Consult Note (Addendum)
[place]  [phone] Fax:([phone]    [place]  Referring MD:  Dr. Debbe Odea  Reason for Referral: Acute left lower extremity DVT, pulmonary embolism, and acute ischemic left MCA stroke in the setting of recent COVID-19 infection  HPI: [name] is a 63 year old male with a past medical history significant for hypertension, hyperlipidemia, diabetes melitis, history of right BKA.  The patient presented to the emergency room with shortness of breath.  COVID-19 testing was obtained and was positive.  He was admitted with acute respiratory failure with hypoxia with sepsis picture secondary to COVID-19 pneumonia.  On admission, he was started on prophylactic dose of Lovenox.  He was also treated with steroids, tocilizumab, convalescent plasma, and antibiotics.  On 04/09/2019, he had a CT angiogram of the chest which showed segmental and subsegmental pulmonary embolism within branches of the right lower lobe pulmonary artery, overall clot burden is mild, no evidence of right heart strain.  He had a Doppler ultrasound performed on 04/10/2019 which showed no DVT in the right lower extremity, left lower extremity with findings consistent with acute DVT involving the left posterior tibial veins.  His Lovenox was then changed to 1 mg/kg every 12 hours.  He was transitioned to Xarelto 15 mg twice daily x21 days followed by 20 mg daily thereafter starting on 04/12/2019.  On 04/17/2019, he developed right hand apraxia and difficulty with fine motor movement of his right hand prompting a CT of the head which was negative.  The patient was monitored closely at the [facility].  His symptoms persisted which prompted an MRI of the brain which was performed on 05/01/2019 and demonstrated multifocal acute ischemia within the left MCA territory, with largest area at the base of the left central sulcus, no hemorrhage or mass-effect, single punctate focus  of acute ischemia within the anterior right parietal white matter.  Patient was transferred to [facility] for further evaluation by neurology.  The patient was continued on Xarelto-last dose was given on 05/03/2019 at 1830- and he is scheduled to transition to heparin later today.  He had a repeat Doppler ultrasound on 05/03/2019 of his bilateral lower extremities which again showed no DVT in the right lower extremity, but showed findings consistent with acute DVT in the left posterior tibial veins and left peroneal veins the findings suggest new clot progression as compared with the prior exam.  The DVT remains in the left posterior tibial veins and is now visualized in the left peroneal veins.  When seen today, the patient reports that his breathing is improving.  He still has a nonproductive cough.  He is on 2-1/2 L/min of O2 via nasal cannula.  He is maintaining his sats in the low 90s on O2.  He denies headaches and vision changes.  No recent fevers or chills.  He denies chest pain but reports that he gets dyspnea with minimal exertion.  Denies abdominal pain, nausea, vomiting.  He has not noticed any bleeding.  He still has some fine motor deficit in his right hand but denies any other neurological deficits.  The patient is married.  Wife also had COVID-19 and is currently in a rehabilitation facility and hopefully going home soon.  He has 1 son.  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  Denies history of alcohol and tobacco use.  He denies personal history of PE, DVT, or CVA prior to this hospitalization.  Denies  family history of blood clots. Hematology was asked see the patient to make recommendations regarding anticoagulation.   Past Medical History:  Diagnosis Date  . Diabetes mellitus without complication ([place])   . GERD (gastroesophageal reflux disease)   . Hx of right BKA ([place])    uses prosthetic   . Hyperlipidemia   . Hypertension   . Liver disorder    tx with  ursodiol   :    Past Surgical History:  Procedure Laterality Date  . BELOW KNEE LEG AMPUTATION Right   . CHOLECYSTECTOMY    . WISDOM TOOTH EXTRACTION    :   CURRENT MEDS: Current Facility-Administered Medications  Medication Dose Route Frequency Provider Last Rate Last Admin  . acetaminophen (TYLENOL) tablet 650 mg  650 mg Oral Q6H PRN Patrecia Pour, MD   650 mg at 04/26/19 0426   Or  . acetaminophen (TYLENOL) suppository 650 mg  650 mg Rectal Q6H PRN Patrecia Pour, MD      . aspirin chewable tablet 81 mg  81 mg Oral Daily Patrecia Pour, MD   81 mg at 05/04/19 0829  . atorvastatin (LIPITOR) tablet 40 mg  40 mg Oral q1800 Patrecia Pour, MD   40 mg at 05/03/19 1832  . chlorpheniramine-HYDROcodone (TUSSIONEX) 10-8 MG/5ML suspension 5 mL  5 mL Oral Q12H Patrecia Pour, MD   5 mL at 05/04/19 0653  . famotidine (PEPCID) tablet 20 mg  20 mg Oral BID Patrecia Pour, MD   20 mg at 05/04/19 0829  . fluticasone (FLONASE) 50 MCG/ACT nasal spray 2 spray  2 spray Each Nare BID Patrecia Pour, MD   2 spray at 05/04/19 0830  . guaiFENesin-dextromethorphan (ROBITUSSIN DM) 100-10 MG/5ML syrup 10 mL  10 mL Oral Q4H PRN Patrecia Pour, MD   10 mL at 04/21/19 2126  . heparin ADULT infusion 100 units/mL (25000 units/274mL sodium chloride 0.45%)  1,200 Units/hr Intravenous Continuous Bryk, Veronda P, RPH      . hydrALAZINE (APRESOLINE) injection 10 mg  10 mg Intravenous Q4H PRN Vance Gather B, MD      . insulin aspart (novoLOG) injection 0-9 Units  0-9 Units Subcutaneous TID WC Patrecia Pour, MD   1 Units at 05/03/19 1831  . Ipratropium-Albuterol (COMBIVENT) respimat 1 puff  1 puff Inhalation Q6H PRN Patrecia Pour, MD      . Ipratropium-Albuterol (COMBIVENT) respimat 1 puff  1 puff Inhalation BID Debbe Odea, MD      . lip balm (CARMEX) ointment   Topical PRN Patrecia Pour, MD      . ondansetron (ZOFRAN) tablet 4 mg  4 mg Oral Q6H PRN Patrecia Pour, MD       Or  . ondansetron (ZOFRAN) injection 4 mg  4 mg  Intravenous Q6H PRN Patrecia Pour, MD      . oxyCODONE (Oxy IR/ROXICODONE) immediate release tablet 5 mg  5 mg Oral Q4H PRN Patrecia Pour, MD   5 mg at 04/26/19 2118  . polyethylene glycol (MIRALAX / GLYCOLAX) packet 17 g  17 g Oral Daily PRN Patrecia Pour, MD   17 g at 04/20/19 0200  . [START ON 05/05/2019] predniSONE (DELTASONE) tablet 10 mg  10 mg Oral Q breakfast Patrecia Pour, MD      . senna-docusate (Senokot-S) tablet 2 tablet  2 tablet Oral QHS PRN Patrecia Pour, MD   2 tablet at 05/03/19 2229  . sodium chloride (  OCEAN) 0.65 % nasal spray 1 spray  1 spray Each Nare PRN Patrecia Pour, MD   1 spray at 05/02/19 2313  . sodium chloride flush (NS) 0.9 % injection 10-40 mL  10-40 mL Intracatheter Q12H Patrecia Pour, MD   10 mL at 05/04/19 0831  . sodium chloride flush (NS) 0.9 % injection 10-40 mL  10-40 mL Intracatheter PRN Patrecia Pour, MD      . tamsulosin (FLOMAX) capsule 0.4 mg  0.4 mg Oral QPC supper Patrecia Pour, MD   0.4 mg at 05/03/19 1832  . ursodiol (ACTIGALL) capsule 300 mg  300 mg Oral BID Patrecia Pour, MD   300 mg at 05/04/19 [id number]  . vitamin C (ASCORBIC ACID) tablet 500 mg  500 mg Oral Daily Patrecia Pour, MD   500 mg at 05/04/19 0829  . zinc sulfate capsule 220 mg  220 mg Oral Daily Patrecia Pour, MD   220 mg at 05/04/19 [id number]      No Known Allergies:  Family History  Problem Relation Age of Onset  . Colon polyps Father   . Colon cancer Neg Hx   . Rectal cancer Neg Hx   . Stomach cancer Neg Hx   :  Social History   Socioeconomic History  . Marital status: Married    Spouse name: Not on file  . Number of children: 1  . Years of education: Not on file  . Highest education level: Not on file  Occupational History  . Not on file  Tobacco Use  . Smoking status: Never Smoker  . Smokeless tobacco: Never Used  Substance and Sexual Activity  . Alcohol use: No  . Drug use: No  . Sexual activity: Not on file  Other Topics Concern  . Not on file  Social History  Narrative  . Not on file   Social Determinants of Health   Financial Resource Strain:   . Difficulty of Paying Living Expenses: Not on file  Food Insecurity:   . Worried About Charity fundraiser in the Last Year: Not on file  . Ran Out of Food in the Last Year: Not on file  Transportation Needs:   . Lack of Transportation (Medical): Not on file  . Lack of Transportation (Non-Medical): Not on file  Physical Activity:   . Days of Exercise per Week: Not on file  . Minutes of Exercise per Session: Not on file  Stress:   . Feeling of Stress : Not on file  Social Connections:   . Frequency of Communication with Friends and Family: Not on file  . Frequency of Social Gatherings with Friends and Family: Not on file  . Attends Religious Services: Not on file  . Active Member of Clubs or Organizations: Not on file  . Attends Archivist Meetings: Not on file  . Marital Status: Not on file  Intimate Partner Violence:   . Fear of Current or Ex-Partner: Not on file  . Emotionally Abused: Not on file  . Physically Abused: Not on file  . Sexually Abused: Not on file  :  REVIEW OF SYSTEMS: A comprehensive 14 point review of systems was negative except as noted in the HPI.  Exam: Patient Vitals for the past 24 hrs:  BP Temp Temp src Pulse Resp SpO2 Height Weight  05/04/19 1134 113/72 97.7 F (36.5 C) -- 91 18 96 % -- --  05/04/19 0820 117/81 98.1 F (36.7 C) Oral 91  20 97 % -- --  05/04/19 0742 -- -- -- -- -- 96 % 5\' 6"  (1.676 m) 194 lb 3.6 oz (88.1 kg)  05/04/19 0416 124/77 98 F (36.7 C) Oral 91 18 96 % -- --  05/03/19 2325 -- 97.6 F (36.4 C) Oral 84 (!) 21 -- -- --  05/03/19 2108 -- -- -- -- -- 100 % -- --  05/03/19 2103 -- -- -- 98 19 -- -- --  05/03/19 2100 -- -- -- 86 -- -- -- --  05/03/19 1900 (!) 151/73 98.3 F (36.8 C) Oral (!) 103 (!) 22 97 % -- --  05/03/19 1637 120/87 97.7 F (36.5 C) Oral 94 19 95 % -- --    General:  well-nourished in no acute distress.    Eyes: PERRL, no scleral icterus.   ENT:  There were no oropharyngeal lesions.     Respiratory: lungs were clear bilaterally without wheezing or crackles.  Cardiovascular:  Regular rate and rhythm, S1/S2, without murmur, rub or gallop.  There was no pedal edema in the left leg.  Right BKA. GI:  abdomen was soft, flat, nontender, nondistended, without organomegaly.  Musculoskeletal:  no spinal tenderness of palpation of vertebral spine.   Skin exam was without ecchymosis, petechiae.   Neuro exam was nonfocal.  Patient was alert and oriented.  Attention was good.   Language was appropriate.  Mood was normal without depression.  Speech was not pressured.  Thought content was not tangential.    LABS:  Lab Results  Component Value Date   WBC 9.5 05/02/2019   HGB 15.0 05/02/2019   HCT 46.0 05/02/2019   PLT 180 05/02/2019   GLUCOSE 110 (H) 05/02/2019   CHOL 109 05/03/2019   TRIG 46 05/03/2019   HDL 46 05/03/2019   LDLCALC 54 05/03/2019   ALT 80 (H) 05/02/2019   AST 36 05/02/2019   NA 136 05/02/2019   K 4.8 05/02/2019   CL 93 (L) 05/02/2019   CREATININE 0.89 05/02/2019   BUN 19 05/02/2019   CO2 33 (H) 05/02/2019   HGBA1C 6.8 (H) 05/03/2019   MICROALBUR 10 12/28/2018    CT ANGIO HEAD W OR WO CONTRAST  Result Date: 05/03/2019 CLINICAL DATA:  Follow-up stroke.  Coronavirus infection. EXAM: CT ANGIOGRAPHY HEAD AND NECK TECHNIQUE: Multidetector CT imaging of the head and neck was performed using the standard protocol during bolus administration of intravenous contrast. Multiplanar CT image reconstructions and MIPs were obtained to evaluate the vascular anatomy. Carotid stenosis measurements (when applicable) are obtained utilizing NASCET criteria, using the distal internal carotid diameter as the denominator. CONTRAST:  18mL OMNIPAQUE IOHEXOL 350 MG/ML SOLN COMPARISON:  MRI 2 days ago FINDINGS: CT HEAD FINDINGS Brain: Small acute infarction shown by previous MRI are not visible by CT. The CT  examination is within normal limits. No sign of mass, hemorrhage, hydrocephalus or extra-axial collection. Vascular: There is atherosclerotic calcification of the major vessels at the base of the brain. Skull: Negative Sinuses: Clear Orbits: Normal Review of the MIP images confirms the above findings CTA NECK FINDINGS Aortic arch: Mild atherosclerosis of the aortic arch. No origin stenosis. Branching pattern is normal without origin stenosis. Right carotid system: Common carotid artery widely patent to the bifurcation. No atherosclerotic disease at the bifurcation. Cervical ICA is tortuous but widely patent. Left carotid system: Common carotid artery widely patent to the bifurcation. No bifurcation atherosclerotic disease. Cervical ICA is tortuous but widely patent. Vertebral arteries: Vertebral artery origins are widely patent.  Both vertebral arteries appear normal through the cervical region to the foramen magnum. Skeleton: Ordinary cervical spondylosis. Other neck: No mass or lymphadenopathy.  Mild thyroid enlargement. Upper chest: Hazy/patchy bilateral pulmonary infiltrates consistent with viral pneumonia. Review of the MIP images confirms the above findings CTA HEAD FINDINGS Anterior circulation: Both internal carotid arteries are patent through the skull base and siphon regions. No siphon stenosis. The anterior and middle cerebral vessels are patent without proximal stenosis, aneurysm or vascular malformation. No large or medium vessel occlusion. Posterior circulation: Both vertebral arteries are widely patent through the foramen magnum to the basilar. No basilar stenosis. Posterior circulation branch vessels are normal. Venous sinuses: Patent and normal. Anatomic variants: None significant. Review of the MIP images confirms the above findings IMPRESSION: No significant atherosclerotic disease. No stenosis. No intracranial large or medium vessel occlusion. Bilateral infarctions shown by MRI could be due to  micro embolic disease from the heart or could be due to hypercoagulable state with small-vessel infarctions. Electronically Signed   By: Nelson Chimes M.D.   On: 05/03/2019 18:22   DG Chest 1 View  Result Date: 04/18/2019 CLINICAL DATA:  Onset shortness of breath, cough, fever and chills 5 days ago. COVID-19 positive patient. EXAM: CHEST  1 VIEW COMPARISON:  Single-view of the chest 04/13/2019 and 04/08/2019. FINDINGS: Extensive multifocal airspace disease is worse on the left and has progressed since the prior examinations. No pneumothorax or pleural fluid. Heart size is normal. IMPRESSION: Worsened multifocal pneumonia. Electronically Signed   By: Inge Rise M.D.   On: 04/18/2019 10:10   DG Chest 1 View  Result Date: 04/13/2019 CLINICAL DATA:  Followup shortness of breath EXAM: CHEST  1 VIEW COMPARISON:  April 09, 2019 FINDINGS: The heart size and mediastinal contours are unchanged. There is hazy ground-glass opacities seen throughout both lungs, predominantly within the lower lungs. There is overall shallow degree of aeration. No large airspace consolidation or pleural effusion. No acute osseous abnormality. IMPRESSION: Hazy ground-glass opacities within both lungs, predominantly within the lower lungs as on the recent CT. Electronically Signed   By: Prudencio Pair M.D.   On: 04/13/2019 13:54   CT HEAD WO CONTRAST  Result Date: 04/17/2019 CLINICAL DATA:  Focal neuro deficit.  Rule out stroke. EXAM: CT HEAD WITHOUT CONTRAST TECHNIQUE: Contiguous axial images were obtained from the base of the skull through the vertex without intravenous contrast. COMPARISON:  None. FINDINGS: Brain: No evidence of acute infarction, hemorrhage, hydrocephalus, extra-axial collection or mass lesion/mass effect. Image quality degraded by mild motion. Vascular: Negative for hyperdense vessel Skull: Negative Sinuses/Orbits: Negative Other: None IMPRESSION: Negative CT head Electronically Signed   By: Franchot Gallo  M.D.   On: 04/17/2019 19:23   CT ANGIO NECK W OR WO CONTRAST  Result Date: 05/03/2019 CLINICAL DATA:  Follow-up stroke.  Coronavirus infection. EXAM: CT ANGIOGRAPHY HEAD AND NECK TECHNIQUE: Multidetector CT imaging of the head and neck was performed using the standard protocol during bolus administration of intravenous contrast. Multiplanar CT image reconstructions and MIPs were obtained to evaluate the vascular anatomy. Carotid stenosis measurements (when applicable) are obtained utilizing NASCET criteria, using the distal internal carotid diameter as the denominator. CONTRAST:  59mL OMNIPAQUE IOHEXOL 350 MG/ML SOLN COMPARISON:  MRI 2 days ago FINDINGS: CT HEAD FINDINGS Brain: Small acute infarction shown by previous MRI are not visible by CT. The CT examination is within normal limits. No sign of mass, hemorrhage, hydrocephalus or extra-axial collection. Vascular: There is atherosclerotic calcification of the major  vessels at the base of the brain. Skull: Negative Sinuses: Clear Orbits: Normal Review of the MIP images confirms the above findings CTA NECK FINDINGS Aortic arch: Mild atherosclerosis of the aortic arch. No origin stenosis. Branching pattern is normal without origin stenosis. Right carotid system: Common carotid artery widely patent to the bifurcation. No atherosclerotic disease at the bifurcation. Cervical ICA is tortuous but widely patent. Left carotid system: Common carotid artery widely patent to the bifurcation. No bifurcation atherosclerotic disease. Cervical ICA is tortuous but widely patent. Vertebral arteries: Vertebral artery origins are widely patent. Both vertebral arteries appear normal through the cervical region to the foramen magnum. Skeleton: Ordinary cervical spondylosis. Other neck: No mass or lymphadenopathy.  Mild thyroid enlargement. Upper chest: Hazy/patchy bilateral pulmonary infiltrates consistent with viral pneumonia. Review of the MIP images confirms the above findings  CTA HEAD FINDINGS Anterior circulation: Both internal carotid arteries are patent through the skull base and siphon regions. No siphon stenosis. The anterior and middle cerebral vessels are patent without proximal stenosis, aneurysm or vascular malformation. No large or medium vessel occlusion. Posterior circulation: Both vertebral arteries are widely patent through the foramen magnum to the basilar. No basilar stenosis. Posterior circulation branch vessels are normal. Venous sinuses: Patent and normal. Anatomic variants: None significant. Review of the MIP images confirms the above findings IMPRESSION: No significant atherosclerotic disease. No stenosis. No intracranial large or medium vessel occlusion. Bilateral infarctions shown by MRI could be due to micro embolic disease from the heart or could be due to hypercoagulable state with small-vessel infarctions. Electronically Signed   By: Nelson Chimes M.D.   On: 05/03/2019 18:22   MR BRAIN WO CONTRAST  Result Date: 05/01/2019 CLINICAL DATA:  Focal neuro deficit, > 6 hrs, stroke suspected EXAM: MRI HEAD WITHOUT CONTRAST TECHNIQUE: Multiplanar, multiecho pulse sequences of the brain and surrounding structures were obtained without intravenous contrast. COMPARISON:  None. FINDINGS: Brain: There is multifocal abnormal diffusion restriction within the left MCA territory. The largest area is at the base of the left central sulcus. There is a single contralateral punctate focus of reduced diffusion in the anterior right parietal white matter. Minimal chronic white matter hyperintensity. Brain volume is normal. No hydrocephalus. There are no old infarcts. No acute or chronic hemorrhage. The midline structures are normal. Vascular: Flow voids are maintained. Skull and upper cervical spine: The bone marrow signal of the cranium and upper cervical vertebrae is normal. There is no skull base lesion. The visualized upper cervical spinal cord is normal. Sinuses/Orbits: There  is no paranasal sinus fluid level or advanced mucosal thickening. There is no mastoid or middle ear effusion. The orbits are normal. Other: None IMPRESSION: 1. Multifocal acute ischemia within the left MCA territory, with largest area at the base of the left central sulcus. No hemorrhage or mass effect. 2. Single punctate focus of acute ischemia within the anterior right parietal white matter. Electronically Signed   By: Ulyses Jarred M.D.   On: 05/01/2019 22:10   DG CHEST PORT 1 VIEW  Result Date: 04/27/2019 CLINICAL DATA:  Shortness of breath. COVID-19. EXAM: PORTABLE CHEST 1 VIEW COMPARISON:  Chest x-rays dated 04/25/2019 and 05/18/2019 FINDINGS: Bilateral diffuse pulmonary infiltrates persist with no significant change since the prior exam. Heart size and vascularity are normal. No effusions. No bone abnormality. IMPRESSION: No change in the appearance of the chest since the prior study. Electronically Signed   By: Lorriane Shire M.D.   On: 04/27/2019 09:27   DG CHEST PORT  1 VIEW  Result Date: 04/25/2019 CLINICAL DATA:  Hypoxia in a patient who is COVID-19 positive. EXAM: PORTABLE CHEST 1 VIEW COMPARISON:  Single-view of the chest 04/18/2019 and 04/13/2019 FINDINGS: Extensive bilateral airspace disease persists. Aeration in the right lung base is mildly improved since the most recent exam. No pneumothorax or pleural effusion. Heart size is normal. No acute or focal bony abnormality. IMPRESSION: Multifocal pneumonia appears mildly improved in the right lung base. Electronically Signed   By: Inge Rise M.D.   On: 04/25/2019 11:28   DG Chest Port 1 View  Result Date: 04/08/2019 CLINICAL DATA:  Shortness of breath, fever EXAM: PORTABLE CHEST 1 VIEW COMPARISON:  12/13/2005 FINDINGS: Low lung volumes. No focal airspace disease or effusion. Stable cardiomediastinal silhouette. No pneumothorax. IMPRESSION: No active disease. Electronically Signed   By: Donavan Foil M.D.   On: 04/08/2019 23:13   VAS  [place] TRANSCRANIAL DOPPLER W BUBBLES  Result Date: 05/04/2019  Transcranial Doppler with Bubble Indications: Stroke. Comparison Study: no prior Performing Technologist: Abram Sander RVS  Examination Guidelines: A complete evaluation includes B-mode imaging, spectral Doppler, color Doppler, and power Doppler as needed of all accessible portions of each vessel. Bilateral testing is considered an integral part of a complete examination. Limited examinations for reoccurring indications may be performed as noted.  Summary:  A vascular evaluation was performed. The right middle cerebral artery was studied. An IV was inserted into the patient's right forearm . Verbal informed consent was obtained.  No HITS heard at rest. No HITS heard during valsalva. Negative TCD Bubble study *See table(s) above for measurements and observations.  Diagnosing physician: Antony Contras MD Electronically signed by Antony Contras MD on 05/04/2019 at 1:08:48 PM.    Final    ECHOCARDIOGRAM COMPLETE  Result Date: 04/10/2019   ECHOCARDIOGRAM REPORT   Patient Name:   [name] Date of Exam: 04/10/2019 Medical Rec #:  [id number]       Height:       66.0 in Accession #:    [id number]      Weight:       230.0 lb Date of Birth:  12-Jan-1956       BSA:          2.12 m Patient Age:    40 years        BP:           132/90 mmHg Patient Gender: M               HR:           89 bpm. Exam Location:  Inpatient Procedure: 2D Echo, Cardiac Doppler and Color Doppler Indications:    Pulmonary Embolus  History:        Patient has no prior history of Echocardiogram examinations.                 Risk Factors:Hypertension. Resp. failure, Pul. Emboli, PNA.  Sonographer:    Dustin Flock Referring Phys: [id number] ANKIT CHIRAG AMIN IMPRESSIONS  1. Left ventricular ejection fraction, by visual estimation, is 60 to 65%. The left ventricle has normal function. There is mildly increased left ventricular hypertrophy.  2. Left ventricular diastolic parameters are  consistent with Grade I diastolic dysfunction (impaired relaxation).  3. Global right ventricle has normal systolic function.The right ventricular size is normal.  4. Left atrial size was normal.  5. Right atrial size was normal.  6. The mitral valve is normal in structure. No evidence of mitral valve  regurgitation. No evidence of mitral stenosis.  7. The tricuspid valve is normal in structure. Tricuspid valve regurgitation is mild.  8. The aortic valve is tricuspid. Aortic valve regurgitation is not visualized. No evidence of aortic valve sclerosis or stenosis.  9. The pulmonic valve was not well visualized. Pulmonic valve regurgitation is not visualized. 10. Technically difficult; normal LV systolic function; mild LVH; grade1 diastolic dysfunction; mild TR with peak velocity of 3.8 m/s suggestive of at least moderate pulmonary hypertension; IVC not visualized and therefore PASP cannot be estimated. FINDINGS  Left Ventricle: Left ventricular ejection fraction, by visual estimation, is 60 to 65%. The left ventricle has normal function. There is mildly increased left ventricular hypertrophy. Left ventricular diastolic parameters are consistent with Grade I diastolic dysfunction (impaired relaxation). Normal left atrial pressure. Right Ventricle: The right ventricular size is normal. Global RV systolic function is has normal systolic function. Left Atrium: Left atrial size was normal in size. Right Atrium: Right atrial size was normal in size Pericardium: There is no evidence of pericardial effusion. Mitral Valve: The mitral valve is normal in structure. No evidence of mitral valve stenosis by observation. No evidence of mitral valve regurgitation. Tricuspid Valve: The tricuspid valve is normal in structure. Tricuspid valve regurgitation is mild. Aortic Valve: The aortic valve is tricuspid. Aortic valve regurgitation is not visualized. The aortic valve is structurally normal, with no evidence of sclerosis or stenosis.  Pulmonic Valve: The pulmonic valve was not well visualized. Pulmonic valve regurgitation is not visualized. Aorta: The aortic root is normal in size and structure. Venous: The inferior vena cava was not well visualized.  LEFT VENTRICLE PLAX 2D LVIDd:         3.59 cm  Diastology LVIDs:         2.29 cm  LV e' lateral:   8.49 cm/s LV PW:         1.27 cm  LV E/e' lateral: 8.3 LV IVS:        1.34 cm  LV e' medial:    8.05 cm/s LVOT diam:     2.30 cm  LV E/e' medial:  8.7 LV SV:         36 ml LV SV Index:   16.07 LVOT Area:     4.15 cm  RIGHT VENTRICLE RV Basal diam:  2.57 cm RV S prime:     9.32 cm/s TAPSE (M-mode): 2.6 cm LEFT ATRIUM             Index       RIGHT ATRIUM          Index LA diam:        3.20 cm 1.51 cm/m  RA Area:     8.77 cm LA Vol (A2C):   14.2 ml 6.69 ml/m  RA Volume:   15.50 ml 7.30 ml/m LA Vol (A4C):   23.4 ml 11.03 ml/m LA Biplane Vol: 18.5 ml 8.72 ml/m  AORTIC VALVE LVOT Vmax:   126.00 cm/s LVOT Vmean:  71.700 cm/s LVOT VTI:    0.229 m  AORTA Ao Root diam: 3.10 cm MITRAL VALVE                        TRICUSPID VALVE MV Area (PHT): 2.54 cm             TR Peak grad:   57.8 mmHg MV PHT:        86.71 msec  TR Vmax:        380.00 cm/s MV Decel Time: 299 msec MV E velocity: 70.30 cm/s 103 cm/s  SHUNTS MV A velocity: 87.80 cm/s 70.3 cm/s Systemic VTI:  0.23 m MV E/A ratio:  0.80       1.5       Systemic Diam: 2.30 cm  Kirk Ruths MD Electronically signed by Kirk Ruths MD Signature Date/Time: 04/10/2019/3:12:55 PM    Final    CT Angio Chest/Abd/Pel for Dissection W and/or W/WO  Addendum Date: 04/09/2019   ADDENDUM REPORT: 04/09/2019 20:47 ADDENDUM: Critical Value/emergent results were called by telephone at the time of interpretation on 04/09/2019 at 8:37 pm to provider Dr Shanon Brow, who verbally acknowledged these results. Electronically Signed   By: Julian Hy M.D.   On: 04/09/2019 20:47   Result Date: 04/09/2019 CLINICAL DATA:  Shortness of breath, COVID positive,  positive D-dimer, evaluate for PE EXAM: CT ANGIOGRAPHY CHEST, ABDOMEN AND PELVIS TECHNIQUE: Multidetector CT imaging through the chest, abdomen and pelvis was performed using the standard protocol during bolus administration of intravenous contrast. Multiplanar reconstructed images and MIPs were obtained and reviewed to evaluate the vascular anatomy. CONTRAST:  120mL OMNIPAQUE IOHEXOL 350 MG/ML SOLN COMPARISON:  Chest radiograph dated 04/08/2019 FINDINGS: CTA CHEST FINDINGS Cardiovascular: On unenhanced CT, there is no evidence of intramural hematoma. Preferential opacification of the bilateral pulmonary arteries to the segmental level. Segmental and subsegmental pulmonary emboli within branches of the right lower lobe pulmonary artery (series 7/images 214 and 229). Overall clot burden is mild. No evidence of right heart strain. Although not tailored for evaluation of the thoracic aorta, there is no evidence of thoracic aortic aneurysm or dissection. Mild atherosclerotic calcifications of the aortic arch. The heart is normal in size.  No pericardial effusion. Mediastinum/Nodes: Small mediastinal lymph nodes which do not meet pathologic CT size criteria. Visualized thyroid is unremarkable. Lungs/Pleura: Multifocal/diffuse ground-glass opacities throughout the lungs bilaterally, with relative sparing of the lung apices, and sparing of the subpleural lungs. This appearance is nonspecific but is likely related to the patient's known COVID. No underlying suspicious pulmonary nodules. No pleural effusion or pneumothorax. Musculoskeletal: Mild degenerative changes of the thoracic spine. Review of the MIP images confirms the above findings. CTA ABDOMEN AND PELVIS FINDINGS VASCULAR Aorta: No evidence abdominal aortic aneurysm or dissection.  Patent. Celiac: Patent. SMA: Patent. Renals: Patent bilaterally. Mild atherosclerotic calcifications at the origin on the left. IMA: Patent. Inflow: Patent.  Atherosclerotic  calcifications bilaterally. Veins: Grossly unremarkable. Review of the MIP images confirms the above findings. NON-VASCULAR Hepatobiliary: Liver is within normal limits. Status post cholecystectomy. No intrahepatic or extrahepatic ductal dilatation. Pneumobilia. Pancreas: Within normal limits. Spleen: Within normal limits. Adrenals/Urinary Tract: Adrenal glands are within normal limits. 4 mm nonobstructing right lower pole renal calculus (series 7/image 49). 10 mm posterior left lower pole renal cyst (series 7/image 478), benign. No hydronephrosis. Thick-walled bladder, although underdistended. Stomach/Bowel: Stomach is within normal limits. No evidence of bowel obstruction. Normal appendix (series 7/image 47). Mild sigmoid diverticulosis, without evidence of diverticulitis. Lymphatic: No suspicious abdominopelvic lymphadenopathy. Reproductive: Prostate is unremarkable. Other: No abdominopelvic ascites. Musculoskeletal: Visualized osseous structures are within normal limits. Review of the MIP images confirms the above findings. IMPRESSION: Segmental and subsegmental pulmonary embolism within branches of the right lower lobe pulmonary artery. Overall clot burden is mild. No evidence of right heart strain. No evidence of thoracoabdominal aortic aneurysm or dissection. Multifocal/diffuse ground-glass opacities in the lungs bilaterally, likely related to the patient's known  COVID. 4 mm nonobstructing right lower pole renal calculus. No hydronephrosis. Electronically Signed: By: Julian Hy M.D. On: 04/09/2019 20:28   VAS US CAROTID (at [facility] and [facility] only)  Result Date: 05/03/2019 Carotid Arterial Duplex Study Indications:       CVA, Weakness and Covid positive, positive for DVT. Risk Factors:      Hypertension, hyperlipidemia. Limitations        Today's exam was limited due to the high bifurcation of the                    carotid. Comparison Study:  No prior study on file for comparison Performing Technologist:  Sharion Dove RVS  Examination Guidelines: A complete evaluation includes B-mode imaging, spectral Doppler, color Doppler, and power Doppler as needed of all accessible portions of each vessel. Bilateral testing is considered an integral part of a complete examination. Limited examinations for reoccurring indications may be performed as noted.  Right Carotid Findings: +----------+--------+--------+--------+------------------+--------+           PSV cm/sEDV cm/sStenosisPlaque DescriptionComments +----------+--------+--------+--------+------------------+--------+ CCA Prox  132     18              homogeneous                +----------+--------+--------+--------+------------------+--------+ CCA Distal116     15              homogeneous                +----------+--------+--------+--------+------------------+--------+ ICA Prox  72      16              homogeneous                +----------+--------+--------+--------+------------------+--------+ ICA Distal89                                                 +----------+--------+--------+--------+------------------+--------+ ECA       134     18                                         +----------+--------+--------+--------+------------------+--------+ +---------+--------+--+--------+--+ VertebralPSV cm/s71EDV cm/s18 +---------+--------+--+--------+--+  Left Carotid Findings: +----------+--------+--------+--------+------------------+------------------+           PSV cm/sEDV cm/sStenosisPlaque DescriptionComments           +----------+--------+--------+--------+------------------+------------------+ CCA Prox  157     25                                intimal thickening +----------+--------+--------+--------+------------------+------------------+ CCA Distal126     24                                intimal thickening +----------+--------+--------+--------+------------------+------------------+ ICA Prox  63       9               homogeneous                          +----------+--------+--------+--------+------------------+------------------+ ICA Distal114     37                                                   +----------+--------+--------+--------+------------------+------------------+  ECA       135     14                                                   +----------+--------+--------+--------+------------------+------------------+ +----------+--------+--------+--------+-------------------+           PSV cm/sEDV cm/sDescribeArm Pressure (mmHG) +----------+--------+--------+--------+-------------------+ [id number]                                         +----------+--------+--------+--------+-------------------+ +---------+--------+--+--------+-+ VertebralPSV cm/s53EDV cm/s7 +---------+--------+--+--------+-+  Summary: Right Carotid: The extracranial vessels were near-normal with only minimal wall                thickening or plaque. Left Carotid: The extracranial vessels were near-normal with only minimal wall               thickening or plaque. Vertebrals:  Bilateral vertebral arteries demonstrate antegrade flow. Subclavians: Normal flow hemodynamics were seen in bilateral subclavian              arteries. *See table(s) above for measurements and observations.  Electronically signed by Antony Contras MD on 05/03/2019 at 1:13:23 PM.    Final    VAS [place] LOWER EXTREMITY VENOUS (DVT)  Result Date: 05/03/2019  Lower Venous Study Indications: Stroke, and Covid positive, recent history of left posterior tibial DVT found 04/10/19.  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 Comparison Study: Prior study on file for comparison from 04/10/19 Performing Technologist: Sharion Dove RVS  Examination Guidelines: A complete evaluation includes B-mode imaging, spectral Doppler, color Doppler, and power Doppler as needed of all accessible portions of each vessel. Bilateral testing is considered  an integral part of a complete examination. Limited examinations for reoccurring indications may be performed as noted.  +---------+---------------+---------+-----------+----------+-------------------+ RIGHT    CompressibilityPhasicitySpontaneityPropertiesThrombus Aging      +---------+---------------+---------+-----------+----------+-------------------+ CFV      Full           Yes      Yes                                      +---------+---------------+---------+-----------+----------+-------------------+ SFJ      Full                                                             +---------+---------------+---------+-----------+----------+-------------------+ FV Prox  Full                                                             +---------+---------------+---------+-----------+----------+-------------------+ FV Mid   Full                                                             +---------+---------------+---------+-----------+----------+-------------------+  FV DistalFull                                                             +---------+---------------+---------+-----------+----------+-------------------+ PFV      Full                                                             +---------+---------------+---------+-----------+----------+-------------------+ POP                                                   patent by color and                                                       Doppler             +---------+---------------+---------+-----------+----------+-------------------+   +---------+---------------+---------+-----------+----------+--------------+ LEFT     CompressibilityPhasicitySpontaneityPropertiesThrombus Aging +---------+---------------+---------+-----------+----------+--------------+ CFV      Full           Yes      Yes                                  +---------+---------------+---------+-----------+----------+--------------+ SFJ      Full                                                        +---------+---------------+---------+-----------+----------+--------------+ FV Prox  Full                                                        +---------+---------------+---------+-----------+----------+--------------+ FV Mid   Full                                                        +---------+---------------+---------+-----------+----------+--------------+ FV DistalFull                                                        +---------+---------------+---------+-----------+----------+--------------+ PFV      Full                                                        +---------+---------------+---------+-----------+----------+--------------+  POP      Full           Yes      Yes                                 +---------+---------------+---------+-----------+----------+--------------+ PTV      None                                         Acute          +---------+---------------+---------+-----------+----------+--------------+ PERO     None                                         Acute          +---------+---------------+---------+-----------+----------+--------------+     Summary: Right: There is no evidence of deep vein thrombosis in the lower extremity. Left: Findings consistent with acute deep vein thrombosis involving the left posterior tibial veins, and left peroneal veins. Findings suggest new clot progression as compared to previous examination. DVT remains in left posterior tibial veins and is now  visualized in the left peroneal veins.  *See table(s) above for measurements and observations. Electronically signed by Monica Martinez MD on 05/03/2019 at 1:50:02 PM.    Final    VAS [place] LOWER EXTREMITY VENOUS (DVT)  Result Date: 04/10/2019  Lower Venous Study Indications: Swelling, and Positive  Covid-19.  Risk Factors: Confirmed PE Surgery Right below knee amputation. Anticoagulation: Lovenox. Comparison Study: No priors. Performing Technologist: Oda Cogan RDMS, RVT  Examination Guidelines: A complete evaluation includes B-mode imaging, spectral Doppler, color Doppler, and power Doppler as needed of all accessible portions of each vessel. Bilateral testing is considered an integral part of a complete examination. Limited examinations for reoccurring indications may be performed as noted.  +---------+---------------+---------+-----------+----------+--------------+ RIGHT    CompressibilityPhasicitySpontaneityPropertiesThrombus Aging +---------+---------------+---------+-----------+----------+--------------+ CFV      Full           Yes      Yes                                 +---------+---------------+---------+-----------+----------+--------------+ SFJ      Full                                                        +---------+---------------+---------+-----------+----------+--------------+ FV Prox  Full                                                        +---------+---------------+---------+-----------+----------+--------------+ FV Mid   Full                                                        +---------+---------------+---------+-----------+----------+--------------+ FV  DistalFull                                                        +---------+---------------+---------+-----------+----------+--------------+ PFV      Full                                                        +---------+---------------+---------+-----------+----------+--------------+ POP      Full           Yes      Yes                                 +---------+---------------+---------+-----------+----------+--------------+ PTV                                                   BKA             +---------+---------------+---------+-----------+----------+--------------+ PERO                                                  BKA            +---------+---------------+---------+-----------+----------+--------------+   +---------+---------------+---------+-----------+----------+--------------+ LEFT     CompressibilityPhasicitySpontaneityPropertiesThrombus Aging +---------+---------------+---------+-----------+----------+--------------+ CFV      Full           Yes      Yes                                 +---------+---------------+---------+-----------+----------+--------------+ SFJ      Full                                                        +---------+---------------+---------+-----------+----------+--------------+ FV Prox  Full                                                        +---------+---------------+---------+-----------+----------+--------------+ FV Mid   Full                                                        +---------+---------------+---------+-----------+----------+--------------+ FV DistalFull                                                        +---------+---------------+---------+-----------+----------+--------------+  PFV      Full                                                        +---------+---------------+---------+-----------+----------+--------------+ POP      Full           Yes      Yes                                 +---------+---------------+---------+-----------+----------+--------------+ PTV      None           No       No                   Acute          +---------+---------------+---------+-----------+----------+--------------+ PERO                                                  Not visualized +---------+---------------+---------+-----------+----------+--------------+    Summary: Right: There is no evidence of deep vein thrombosis in the lower extremity. Left: Findings consistent with  acute deep vein thrombosis involving the left posterior tibial veins.  *See table(s) above for measurements and observations. Electronically signed by Deitra Mayo MD on 04/10/2019 at 5:02:20 PM.    Final    ECHOCARDIOGRAM LIMITED  Result Date: 05/03/2019   ECHOCARDIOGRAM LIMITED REPORT   Patient Name:   [name] Date of Exam: 05/03/2019 Medical Rec #:  [id number]       Height:       66.0 in Accession #:    [id number]      Weight:       194.1 lb Date of Birth:  [birth year]       BSA:          1.97 m Patient Age:    60 years        BP:           134/90 mmHg Patient Gender: M               HR:           94 bpm. Exam Location:  Inpatient  Procedure: Limited Echo, Cardiac Doppler and Limited Color Doppler Indications:    stroke 434.91  History:        Patient has prior history of Echocardiogram examinations, most                 recent 04/10/2019.  Sonographer:    Johny Chess Referring Phys: [id number] Patrecia Pour  Sonographer Comments: Image acquisition challenging due to respiratory motion. IMPRESSIONS  1. Left ventricular ejection fraction, by visual estimation, is 60 to 65%. The left ventricle has normal function. There is no left ventricular hypertrophy.  2. Left ventricular diastolic parameters are consistent with Grade I diastolic dysfunction (impaired relaxation).  3. The left ventricle has no regional wall motion abnormalities.  4. Global right ventricle has normal systolic function.The right ventricular size is normal.  5. Left atrial size was normal.  6. Right atrial size was normal.  7. The mitral valve is normal in structure. No evidence of mitral  valve regurgitation. No evidence of mitral stenosis.  8. The tricuspid valve is normal in structure. Tricuspid valve regurgitation is not demonstrated.  9. The aortic valve is normal in structure. Aortic valve regurgitation is mild. Mild aortic valve sclerosis without stenosis. 10. The pulmonic valve was normal in structure. Pulmonic valve  regurgitation is not visualized. 11. Normal LV systolic function; grade 1 diastolic dysfunction; mildly sclerotic aortic valve with mild AI. FINDINGS  Left Ventricle: Left ventricular ejection fraction, by visual estimation, is 60 to 65%. The left ventricle has normal function. The left ventricle has no regional wall motion abnormalities. There is no left ventricular hypertrophy. Left ventricular diastolic parameters are consistent with Grade I diastolic dysfunction (impaired relaxation). Normal left atrial pressure. Right Ventricle: The right ventricular size is normal.Global RV systolic function is has normal systolic function. Left Atrium: Left atrial size was normal in size. Right Atrium: Right atrial size was normal in size Pericardium: There is no evidence of pericardial effusion. Mitral Valve: The mitral valve is normal in structure. No evidence of mitral valve stenosis by observation. MV Area by PHT, 3.17 cm. MV PHT, 69.31 msec. No evidence of mitral valve regurgitation. Tricuspid Valve: The tricuspid valve is normal in structure. Tricuspid valve regurgitation is not demonstrated. Aortic Valve: The aortic valve is normal in structure. Aortic valve regurgitation is mild. Mild aortic valve sclerosis is present, with no evidence of aortic valve stenosis. Pulmonic Valve: The pulmonic valve was normal in structure. Pulmonic valve regurgitation is not visualized. Aorta: The aortic root, ascending aorta and aortic arch are all structurally normal, with no evidence of dilitation or obstruction. Venous: The inferior vena cava was not well visualized.   LEFT VENTRICLE          Normals PLAX 2D LVIDd:         3.87 cm  3.6 cm   Diastology                  Normals LVIDs:         2.75 cm  1.7 cm   LV e' lateral:   10.80 cm/s 6.42 cm/s LV PW:         1.04 cm  1.4 cm   LV E/e' lateral: 9.4        15.4 LV IVS:        1.00 cm  1.3 cm   LV e' medial:    7.29 cm/s  6.96 cm/s LVOT diam:     2.40 cm  2.0 cm   LV E/e' medial:   13.9       6.96 LV SV:         36 ml    79 ml LV SV Index:   17.74    45 ml/m2 LVOT Area:     4.52 cm 3.14 cm2  LEFT ATRIUM         Index LA diam:    3.50 cm 1.77 cm/m  AORTIC VALVE             Normals LVOT Vmax:   116.00 cm/s LVOT Vmean:  77.600 cm/s 75 cm/s LVOT VTI:    0.236 m     25.3 cm  AORTA                 Normals Ao Root diam: 3.30 cm 31 mm Ao Asc diam:  3.40 cm 31 mm MITRAL VALVE              Normals MV Area (PHT): 3.17  cm              SHUNTS MV PHT:        69.31 msec 55 ms      Systemic VTI:  0.24 m MV Decel Time: 239 msec   187 ms     Systemic Diam: 2.40 cm MV E velocity: 101.00 cm/s 103 cm/s MV A velocity: 115.00 cm/s 70.3 cm/s MV E/A ratio:  0.88        1.5  Kirk Ruths MD Electronically signed by Kirk Ruths MD Signature Date/Time: 05/03/2019/9:22:25 AM    Final     ASSESSMENT AND PLAN:  1.  Acute left MCA infarcts 2.  Acute left lower extremity DVT and PE 3.  COVID-19 pneumonia/acute respiratory failure with hypoxia 4.  Diabetes mellitus 5.  Hypertension  -The patient developed an acute left lower extremity DVT, PE, and and subsequent acute ischemic left MCA stroke while on Xarelto and aspirin.  Likely related to hypercoagulable state due to COVID-19 infection.  He has had continued progression of the clot in his left lower extremity while on Xarelto.  Xarelto has now been stopped and he will begin a heparin drip later today.  He remains on aspirin 81 mg daily.  We discussed different types of anticoagulation today.  Given that he developed the left MCA infarct and has had progression of his clot in his left lower extremity while on Xarelto, recommend stopping Xarelto.  Agree with heparin drip.  Recommend starting him on warfarin for treatment of his DVT, PE, and recurrent CVA prevention.  Will need to continue the heparin drip until he is therapeutic on Coumadin.  We will also check a hypercoag panel on him.  The patient is agreeable to this plan. -He seems to be improving from a  respiratory standpoint.  He is on tapering doses of prednisone.  CRP and D-dimer trending downward.  Continue prednisone taper per hospitalist. -Continue management of his other medical conditions per hospitalist.  Thank you for this referral.  Mikey Bussing, DNP, AGPCNP-BC, AOCNP  ADDENDUM: I saw and examined Mr. Schneider.  I agree with the above assessment by Erasmo Downer.  He is very interesting.  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  He subsequently was admitted.  He was then found to have a thromboembolic event with a blood clot in the left leg and bilateral pulmonary emboli.  He was placed on Xarelto.  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  A Doppler done of his legs on 05/03/2019 showed progression of the thrombus in the left leg.  I am not sure why he is a "failure" of Xarelto.  Given this fact, I think he is going to need Coumadin.  I would clearly have him on anticoagulation with heparin infusion.  I think we really need to be aggressive with his anticoagulation.  We need to try to get this thrombus in his leg resolved.  It sounds like his neurological issues are improving which is a blessing.  I think he is on baby aspirin.  I would probably keep him on baby aspirin along with the Coumadin.  We are sending a hypercoagulable panel on him.  He has no family history of thromboembolic disease.  He does not have any obvious malignancy.  He does not smoke.  There is no history of diabetes.  I would keep him on anticoagulation for at least a year or so.  This will be dictated by the hypercoagulable  panel.  Again, he is very interesting to talk to.  I answered all of his questions.  He obviously is very well-informed and has a very extensive knowledge base.  We really need to get him better so that he can get back to work.  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  I appreciate all the great care that he is getting.  He clearly has had a  very lengthy hospitalization.  [name] Haw, MD  [name] 1:5

## 2019-05-04 NOTE — Progress Notes (Addendum)
ANTICOAGULATION CONSULT NOTE - Initial Consult  Pharmacy Consult for heparin Indication: PE/DVT  No Known Allergies  Patient Measurements: Height: 5\' 6"  (167.6 cm) Weight: 194 lb 2.2 oz (88.1 kg) IBW/kg (Calculated) : 63.8 Heparin Dosing Weight: 80kg  Vital Signs: Temp: 98 F (36.7 C) (12/11 0416) Temp Source: Oral (12/11 0416) BP: 124/77 (12/11 0416) Pulse Rate: 91 (12/11 0416)  Labs: Recent Labs    05/02/19 0610  HGB 15.0  HCT 46.0  PLT 180  CREATININE 0.89    Estimated Creatinine Clearance: 88.3 mL/min (by C-G formula based on SCr of 0.89 mg/dL).   Medical History: Past Medical History:  Diagnosis Date  . GERD (gastroesophageal reflux disease)   . 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 Hyperlipidemia   . Hypertension   . Liver disorder    tx with ursodiol     Medications:  Medications Prior to Admission  Medication Sig Dispense Refill Last Dose  . acetaminophen (TYLENOL) 500 MG tablet Take 500 mg by mouth every 6 (six) hours as needed for mild pain or fever.    04/08/2019 at Unknown time  . aspirin 81 MG chewable tablet Chew 81 mg by mouth daily.   04/08/2019 at Unknown time  . atorvastatin (LIPITOR) 20 MG tablet Take 2 tablets (40 mg total) by mouth at bedtime. 90 tablet 0 04/07/2019 at Unknown time  . famotidine (PEPCID) 20 MG tablet Take 20 mg by mouth 2 (two) times daily.   04/08/2019 at Unknown time  . ursodiol (ACTIGALL) 250 MG tablet Take 250 mg by mouth 2 (two) times daily.   04/08/2019 at Unknown time  . valsartan-hydrochlorothiazide (DIOVAN-HCT) 320-25 MG tablet TAKE 1 TABLET BY MOUTH DAILY. 90 tablet 0 04/08/2019 at Unknown time  . doxycycline (VIBRA-TABS) 100 MG tablet Take 1 tablet (100 mg total) by mouth 2 (two) times daily. (Patient not taking: Reported on 12/28/2018) 20 tablet 0 Completed Course at Unknown time   Scheduled:  . aspirin  81 mg Oral Daily  . atorvastatin  40 mg Oral q1800  . chlorpheniramine-HYDROcodone  5 mL Oral Q12H   . famotidine  20 mg Oral BID  . fluticasone  2 spray Each Nare BID  . insulin aspart  0-9 Units Subcutaneous TID WC  . Ipratropium-Albuterol  1 puff Inhalation TID  . predniSONE  20 mg Oral Q breakfast   Followed by  . [START ON 05/05/2019] predniSONE  10 mg Oral Q breakfast  . sodium chloride flush  10-40 mL Intracatheter Q12H  . tamsulosin  0.4 mg Oral QPC supper  . ursodiol  300 mg Oral BID  . vitamin C  500 mg Oral Daily  . zinc sulfate  220 mg Oral Daily    Assessment: 63yo male w/ recent Covid infection >> stroke and PE/DVT, has been on Xarelto (last dose 12/10 1830), now to transition to heparin.  Goal of Therapy:  Heparin level 0.3-0.7 units/ml aPTT 66-102 seconds Monitor platelets by anticoagulation protocol: Yes   Plan:  At 1830 will begin heparin gtt at 1200 units/hr and monitor heparin levels, aPTT (while Xarelto affects anti-Xa), and CBC.  Wynona Neat, PharmD, BCPS  05/04/2019,7:53 AM

## 2019-05-04 NOTE — Progress Notes (Signed)
Pt coumadin book and education completed with pt and pt voices understanding. Pt given his coumadin and heparin gtt started as ordered. Reported off to oncoming RN. Delia Heady RN

## 2019-05-04 NOTE — Progress Notes (Signed)
Pt was on 3L oxygen sating at 94%. Oxygen removed whiles pt was sitting up on side of bed and pt oxygen dropped down to 65% RA. Oxygen reapplied and after some deep breathing exercises pt oxygen went up to 94% on 3L. Pt ambulated from bed to chair in room on 3L oxygen and oxygen remained in low 90's with highest at 94%. Pt sitting up in chair with call light within reach. Delia Heady RN

## 2019-05-04 NOTE — Progress Notes (Signed)
TCD bubble study has been completed.   Preliminary results in CV Proc.   Abram Sander 05/04/2019 11:21 AM

## 2019-05-04 NOTE — Progress Notes (Signed)
STROKE TEAM PROGRESS NOTE   INTERVAL HISTORY Patient states his right hand paresthesias and fine motor skills appear to be improving.  Is still on oxygen gets more hypoxic when he is walking.  Patient decided not to participate in the sleep smart trial for sleep apnea.  I performed a transcranial Doppler bubble study at the bedside and it was negative for right-to-left shunt Vitals:   05/03/19 2103 05/03/19 2108 05/03/19 2325 05/04/19 0416  BP:    124/77  Pulse: 98  84 91  Resp: 19  (!) 21 18  Temp:   97.6 F (36.4 C) 98 F (36.7 C)  TempSrc:   Oral Oral  SpO2:  100%  96%  Weight:      Height:        CBC:  Recent Labs  Lab 05/01/19 0341 05/02/19 0610  WBC 10.2 9.5  NEUTROABS 7.2 6.5  HGB 14.8 15.0  HCT 45.1 46.0  MCV 94.7 96.4  PLT 159 [id number]    Basic Metabolic Panel:  Recent Labs  Lab 05/01/19 0341 05/02/19 0610  NA 136 136  K 4.6 4.8  CL 93* 93*  CO2 32 33*  GLUCOSE 100* 110*  BUN 21 19  CREATININE 0.87 0.89  CALCIUM 8.9 8.9  MG 2.2 2.2   Lipid Panel:     Component Value Date/Time   CHOL 109 05/03/2019 0432   CHOL 189 12/28/2018 1403   TRIG 46 05/03/2019 0432   HDL 46 05/03/2019 0432   HDL 40 12/28/2018 1403   CHOLHDL 2.4 05/03/2019 0432   VLDL 9 05/03/2019 0432   LDLCALC 54 05/03/2019 0432   LDLCALC 122 (H) 12/28/2018 1403   HgbA1c:  Lab Results  Component Value Date   HGBA1C 6.8 (H) 05/03/2019   Urine Drug Screen: No results found for: LABOPIA, COCAINSCRNUR, LABBENZ, AMPHETMU, THCU, LABBARB  Alcohol Level No results found for: [facility]  IMAGING CT ANGIO HEAD W OR WO CONTRAST  Result Date: 05/03/2019 CLINICAL DATA:  Follow-up stroke.  Coronavirus infection. EXAM: CT ANGIOGRAPHY HEAD AND NECK TECHNIQUE: Multidetector CT imaging of the head and neck was performed using the standard protocol during bolus administration of intravenous contrast. Multiplanar CT image reconstructions and MIPs were obtained to evaluate the vascular anatomy. Carotid stenosis  measurements (when applicable) are obtained utilizing NASCET criteria, using the distal internal carotid diameter as the denominator. CONTRAST:  69mL OMNIPAQUE IOHEXOL 350 MG/ML SOLN COMPARISON:  MRI 2 days ago FINDINGS: CT HEAD FINDINGS Brain: Small acute infarction shown by previous MRI are not visible by CT. The CT examination is within normal limits. No sign of mass, hemorrhage, hydrocephalus or extra-axial collection. Vascular: There is atherosclerotic calcification of the major vessels at the base of the brain. Skull: Negative Sinuses: Clear Orbits: Normal Review of the MIP images confirms the above findings CTA NECK FINDINGS Aortic arch: Mild atherosclerosis of the aortic arch. No origin stenosis. Branching pattern is normal without origin stenosis. Right carotid system: Common carotid artery widely patent to the bifurcation. No atherosclerotic disease at the bifurcation. Cervical ICA is tortuous but widely patent. Left carotid system: Common carotid artery widely patent to the bifurcation. No bifurcation atherosclerotic disease. Cervical ICA is tortuous but widely patent. Vertebral arteries: Vertebral artery origins are widely patent. Both vertebral arteries appear normal through the cervical region to the foramen magnum. Skeleton: Ordinary cervical spondylosis. Other neck: No mass or lymphadenopathy.  Mild thyroid enlargement. Upper chest: Hazy/patchy bilateral pulmonary infiltrates consistent with viral pneumonia. Review of the MIP images  confirms the above findings CTA HEAD FINDINGS Anterior circulation: Both internal carotid arteries are patent through the skull base and siphon regions. No siphon stenosis. The anterior and middle cerebral vessels are patent without proximal stenosis, aneurysm or vascular malformation. No large or medium vessel occlusion. Posterior circulation: Both vertebral arteries are widely patent through the foramen magnum to the basilar. No basilar stenosis. Posterior circulation  branch vessels are normal. Venous sinuses: Patent and normal. Anatomic variants: None significant. Review of the MIP images confirms the above findings IMPRESSION: No significant atherosclerotic disease. No stenosis. No intracranial large or medium vessel occlusion. Bilateral infarctions shown by MRI could be due to micro embolic disease from the heart or could be due to hypercoagulable state with small-vessel infarctions. Electronically Signed   By: Nelson Chimes M.D.   On: 05/03/2019 18:22   CT ANGIO NECK W OR WO CONTRAST  Result Date: 05/03/2019 CLINICAL DATA:  Follow-up stroke.  Coronavirus infection. EXAM: CT ANGIOGRAPHY HEAD AND NECK TECHNIQUE: Multidetector CT imaging of the head and neck was performed using the standard protocol during bolus administration of intravenous contrast. Multiplanar CT image reconstructions and MIPs were obtained to evaluate the vascular anatomy. Carotid stenosis measurements (when applicable) are obtained utilizing NASCET criteria, using the distal internal carotid diameter as the denominator. CONTRAST:  2mL OMNIPAQUE IOHEXOL 350 MG/ML SOLN COMPARISON:  MRI 2 days ago FINDINGS: CT HEAD FINDINGS Brain: Small acute infarction shown by previous MRI are not visible by CT. The CT examination is within normal limits. No sign of mass, hemorrhage, hydrocephalus or extra-axial collection. Vascular: There is atherosclerotic calcification of the major vessels at the base of the brain. Skull: Negative Sinuses: Clear Orbits: Normal Review of the MIP images confirms the above findings CTA NECK FINDINGS Aortic arch: Mild atherosclerosis of the aortic arch. No origin stenosis. Branching pattern is normal without origin stenosis. Right carotid system: Common carotid artery widely patent to the bifurcation. No atherosclerotic disease at the bifurcation. Cervical ICA is tortuous but widely patent. Left carotid system: Common carotid artery widely patent to the bifurcation. No bifurcation  atherosclerotic disease. Cervical ICA is tortuous but widely patent. Vertebral arteries: Vertebral artery origins are widely patent. Both vertebral arteries appear normal through the cervical region to the foramen magnum. Skeleton: Ordinary cervical spondylosis. Other neck: No mass or lymphadenopathy.  Mild thyroid enlargement. Upper chest: Hazy/patchy bilateral pulmonary infiltrates consistent with viral pneumonia. Review of the MIP images confirms the above findings CTA HEAD FINDINGS Anterior circulation: Both internal carotid arteries are patent through the skull base and siphon regions. No siphon stenosis. The anterior and middle cerebral vessels are patent without proximal stenosis, aneurysm or vascular malformation. No large or medium vessel occlusion. Posterior circulation: Both vertebral arteries are widely patent through the foramen magnum to the basilar. No basilar stenosis. Posterior circulation branch vessels are normal. Venous sinuses: Patent and normal. Anatomic variants: None significant. Review of the MIP images confirms the above findings IMPRESSION: No significant atherosclerotic disease. No stenosis. No intracranial large or medium vessel occlusion. Bilateral infarctions shown by MRI could be due to micro embolic disease from the heart or could be due to hypercoagulable state with small-vessel infarctions. Electronically Signed   By: Nelson Chimes M.D.   On: 05/03/2019 18:22   VAS US CAROTID (at [facility] and [facility] only)  Result Date: 05/03/2019 Carotid Arterial Duplex Study Indications:       CVA, Weakness and Covid positive, positive for DVT. Risk Factors:      Hypertension, hyperlipidemia.  Limitations        Today's exam was limited due to the high bifurcation of the                    carotid. Comparison Study:  No prior study on file for comparison Performing Technologist: Sharion Dove RVS  Examination Guidelines: A complete evaluation includes B-mode imaging, spectral Doppler, color Doppler, and  power Doppler as needed of all accessible portions of each vessel. Bilateral testing is considered an integral part of a complete examination. Limited examinations for reoccurring indications may be performed as noted.  Right Carotid Findings: +----------+--------+--------+--------+------------------+--------+           PSV cm/sEDV cm/sStenosisPlaque DescriptionComments +----------+--------+--------+--------+------------------+--------+ CCA Prox  132     18              homogeneous                +----------+--------+--------+--------+------------------+--------+ CCA Distal116     15              homogeneous                +----------+--------+--------+--------+------------------+--------+ ICA Prox  72      16              homogeneous                +----------+--------+--------+--------+------------------+--------+ ICA Distal89                                                 +----------+--------+--------+--------+------------------+--------+ ECA       134     18                                         +----------+--------+--------+--------+------------------+--------+ +---------+--------+--+--------+--+ VertebralPSV cm/s71EDV cm/s18 +---------+--------+--+--------+--+  Left Carotid Findings: +----------+--------+--------+--------+------------------+------------------+           PSV cm/sEDV cm/sStenosisPlaque DescriptionComments           +----------+--------+--------+--------+------------------+------------------+ CCA Prox  157     25                                intimal thickening +----------+--------+--------+--------+------------------+------------------+ CCA Distal126     24                                intimal thickening +----------+--------+--------+--------+------------------+------------------+ ICA Prox  63      9               homogeneous                           +----------+--------+--------+--------+------------------+------------------+ ICA Distal114     37                                                   +----------+--------+--------+--------+------------------+------------------+ ECA       135     14                                                   +----------+--------+--------+--------+------------------+------------------+ +----------+--------+--------+--------+-------------------+  PSV cm/sEDV cm/sDescribeArm Pressure (mmHG) +----------+--------+--------+--------+-------------------+ [id number]                                         +----------+--------+--------+--------+-------------------+ +---------+--------+--+--------+-+ VertebralPSV cm/s53EDV cm/s7 +---------+--------+--+--------+-+  Summary: Right Carotid: The extracranial vessels were near-normal with only minimal wall                thickening or plaque. Left Carotid: The extracranial vessels were near-normal with only minimal wall               thickening or plaque. Vertebrals:  Bilateral vertebral arteries demonstrate antegrade flow. Subclavians: Normal flow hemodynamics were seen in bilateral subclavian              arteries. *See table(s) above for measurements and observations.  Electronically signed by Antony Contras MD on 05/03/2019 at 1:13:23 PM.    Final    VAS [place] LOWER EXTREMITY VENOUS (DVT)  Result Date: 05/03/2019  Lower Venous Study Indications: Stroke, and Covid positive, recent history of left posterior tibial DVT found 04/10/19.  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 Comparison Study: Prior study on file for comparison from 04/10/19 Performing Technologist: Sharion Dove RVS  Examination Guidelines: A complete evaluation includes B-mode imaging, spectral Doppler, color Doppler, and power Doppler as needed of all accessible portions of each vessel. Bilateral testing is considered an integral part of a complete examination. Limited  examinations for reoccurring indications may be performed as noted.  +---------+---------------+---------+-----------+----------+-------------------+ RIGHT    CompressibilityPhasicitySpontaneityPropertiesThrombus Aging      +---------+---------------+---------+-----------+----------+-------------------+ CFV      Full           Yes      Yes                                      +---------+---------------+---------+-----------+----------+-------------------+ SFJ      Full                                                             +---------+---------------+---------+-----------+----------+-------------------+ FV Prox  Full                                                             +---------+---------------+---------+-----------+----------+-------------------+ FV Mid   Full                                                             +---------+---------------+---------+-----------+----------+-------------------+ FV DistalFull                                                             +---------+---------------+---------+-----------+----------+-------------------+  PFV      Full                                                             +---------+---------------+---------+-----------+----------+-------------------+ POP                                                   patent by color and                                                       Doppler             +---------+---------------+---------+-----------+----------+-------------------+   +---------+---------------+---------+-----------+----------+--------------+ LEFT     CompressibilityPhasicitySpontaneityPropertiesThrombus Aging +---------+---------------+---------+-----------+----------+--------------+ CFV      Full           Yes      Yes                                 +---------+---------------+---------+-----------+----------+--------------+ SFJ      Full                                                         +---------+---------------+---------+-----------+----------+--------------+ FV Prox  Full                                                        +---------+---------------+---------+-----------+----------+--------------+ FV Mid   Full                                                        +---------+---------------+---------+-----------+----------+--------------+ FV DistalFull                                                        +---------+---------------+---------+-----------+----------+--------------+ PFV      Full                                                        +---------+---------------+---------+-----------+----------+--------------+ POP      Full           Yes      Yes                                 +---------+---------------+---------+-----------+----------+--------------+  PTV      None                                         Acute          +---------+---------------+---------+-----------+----------+--------------+ PERO     None                                         Acute          +---------+---------------+---------+-----------+----------+--------------+     Summary: Right: There is no evidence of deep vein thrombosis in the lower extremity. Left: Findings consistent with acute deep vein thrombosis involving the left posterior tibial veins, and left peroneal veins. Findings suggest new clot progression as compared to previous examination. DVT remains in left posterior tibial veins and is now  visualized in the left peroneal veins.  *See table(s) above for measurements and observations. Electronically signed by Monica Martinez MD on 05/03/2019 at 1:50:02 PM.    Final    ECHOCARDIOGRAM LIMITED  Result Date: 05/03/2019   ECHOCARDIOGRAM LIMITED REPORT   Patient Name:   JAYLANI SONSALLA Date of Exam: 05/03/2019 Medical Rec #:  [id number]       Height:       66.0 in Accession #:    [id number]      Weight:        194.1 lb Date of Birth:  06/15/1955       BSA:          1.97 m Patient Age:    51 years        BP:           134/90 mmHg Patient Gender: M               HR:           94 bpm. Exam Location:  Inpatient  Procedure: Limited Echo, Cardiac Doppler and Limited Color Doppler Indications:    stroke 434.91  History:        Patient has prior history of Echocardiogram examinations, most                 recent 04/10/2019.  Sonographer:    Johny Chess Referring Phys: [id number] Patrecia Pour  Sonographer Comments: Image acquisition challenging due to respiratory motion. IMPRESSIONS  1. Left ventricular ejection fraction, by visual estimation, is 60 to 65%. The left ventricle has normal function. There is no left ventricular hypertrophy.  2. Left ventricular diastolic parameters are consistent with Grade I diastolic dysfunction (impaired relaxation).  3. The left ventricle has no regional wall motion abnormalities.  4. Global right ventricle has normal systolic function.The right ventricular size is normal.  5. Left atrial size was normal.  6. Right atrial size was normal.  7. The mitral valve is normal in structure. No evidence of mitral valve regurgitation. No evidence of mitral stenosis.  8. The tricuspid valve is normal in structure. Tricuspid valve regurgitation is not demonstrated.  9. The aortic valve is normal in structure. Aortic valve regurgitation is mild. Mild aortic valve sclerosis without stenosis. 10. The pulmonic valve was normal in structure. Pulmonic valve regurgitation is not visualized. 11. Normal LV systolic function; grade 1 diastolic dysfunction; mildly sclerotic aortic valve with mild AI. FINDINGS  Left Ventricle: Left ventricular ejection fraction,  by visual estimation, is 60 to 65%. The left ventricle has normal function. The left ventricle has no regional wall motion abnormalities. There is no left ventricular hypertrophy. Left ventricular diastolic parameters are consistent with Grade I diastolic  dysfunction (impaired relaxation). Normal left atrial pressure. Right Ventricle: The right ventricular size is normal.Global RV systolic function is has normal systolic function. Left Atrium: Left atrial size was normal in size. Right Atrium: Right atrial size was normal in size Pericardium: There is no evidence of pericardial effusion. Mitral Valve: The mitral valve is normal in structure. No evidence of mitral valve stenosis by observation. MV Area by PHT, 3.17 cm. MV PHT, 69.31 msec. No evidence of mitral valve regurgitation. Tricuspid Valve: The tricuspid valve is normal in structure. Tricuspid valve regurgitation is not demonstrated. Aortic Valve: The aortic valve is normal in structure. Aortic valve regurgitation is mild. Mild aortic valve sclerosis is present, with no evidence of aortic valve stenosis. Pulmonic Valve: The pulmonic valve was normal in structure. Pulmonic valve regurgitation is not visualized. Aorta: The aortic root, ascending aorta and aortic arch are all structurally normal, with no evidence of dilitation or obstruction. Venous: The inferior vena cava was not well visualized.   LEFT VENTRICLE          Normals PLAX 2D LVIDd:         3.87 cm  3.6 cm   Diastology                  Normals LVIDs:         2.75 cm  1.7 cm   LV e' lateral:   10.80 cm/s 6.42 cm/s LV PW:         1.04 cm  1.4 cm   LV E/e' lateral: 9.4        15.4 LV IVS:        1.00 cm  1.3 cm   LV e' medial:    7.29 cm/s  6.96 cm/s LVOT diam:     2.40 cm  2.0 cm   LV E/e' medial:  13.9       6.96 LV SV:         36 ml    79 ml LV SV Index:   17.74    45 ml/m2 LVOT Area:     4.52 cm 3.14 cm2  LEFT ATRIUM         Index LA diam:    3.50 cm 1.77 cm/m  AORTIC VALVE             Normals LVOT Vmax:   116.00 cm/s LVOT Vmean:  77.600 cm/s 75 cm/s LVOT VTI:    0.236 m     25.3 cm  AORTA                 Normals Ao Root diam: 3.30 cm 31 mm Ao Asc diam:  3.40 cm 31 mm MITRAL VALVE              Normals MV Area (PHT): 3.17 cm              SHUNTS MV  PHT:        69.31 msec 55 ms      Systemic VTI:  0.24 m MV Decel Time: 239 msec   187 ms     Systemic Diam: 2.40 cm MV E velocity: 101.00 cm/s 103 cm/s MV A velocity: 115.00 cm/s 70.3 cm/s MV E/A ratio:  0.88  1.5  Kirk Ruths MD Electronically signed by Kirk Ruths MD Signature Date/Time: 05/03/2019/9:22:25 AM    Final     PHYSICAL EXAM Mildly obese middle-aged African-American male not in distress he is on 2 L nasal cannula oxygen. . Afebrile. Head is nontraumatic. Neck is supple without bruit.    Cardiac exam no murmur or gallop. Lungs are clear to auscultation. Distal pulses are well felt. Neurological Exam ;  Awake  Alert oriented x 3. Normal speech and language.eye movements full without nystagmus.fundi were not visualized. Vision acuity and fields appear normal. Hearing is normal. Palatal movements are normal. Face symmetric. Tongue midline. Normal strength, tone, reflexes and coordination.  Diminished fine finger movements on the right.  Mild weakness of right grip.  Orbits left over right upper extremity.  Normal sensation. Gait deferred.   ASSESSMENT/PLAN Mr. [name] is a 63 y.o. 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 12/9 for evaluation of a subacute L MCA territory infarct. He had new onset R hand weakness 1 week ago, that has improved. He is on xarelto, aspirin and statin.   Stroke:   Subacute L MCA territory infarcts possibly from, hypercoagulable state in setting of COVID-19 w/ PE and DVT despite being on anticoagulation with Xarelto  CT head 11/24 negative   MRI 12/8  Multifocal R MCA territory infarcts and single punctate anterior R parietal infarct.  CTA head & neck no significant large vessel stenosis or occlusion or  atherosclerotic disease.  Carotid Doppler  No significant stenosis   2D Echo EF 60-65%. No source of embolus   TCD w/ bubble pending  LE doppler R ok, L DVT posterior tibial, L peroneal veins w/ new clot progression (previous in L posterior tib, now extends to L peroneal)  LDL 54  HgbA1c 6.8  Xarelto for VTE prophylaxis  On aspirin 81 PTA, now on Xarelto (rivaroxaban) daily.   Therapy recommendations:  Home health (not able to tolerate 3h therapy give respiratory illness)  Disposition:  Plan return home  COVID PNA/Acute Respiratory Failure w/ Hypoxia  Acute LLE DVT and PE  Felt to be due to hypercoagulable state during COVID infection  On xarelto  Hypertension  Stable . BP goal normotensive  Hyperlipidemia  Home meds:  lipitor 20  Now on lipitor 40  LDL 54, goal < 70  Continue statin at discharge  Diabetes type II Controlled  HgbA1c 6.8, goal < 7.0  Other Stroke Risk Factors  Obesity, Body mass index is 31.33 kg/m., recommend weight loss, diet and exercise as appropriate   Hx R BKA  Other Active Problems  AKI 1.46->0.89  BPH on flomax  Hospital day # 25  He presented with subacute right hand weakness due to small left MCA branch infarct possibly related to recent Covid illness hypercoagulability despite being on anticoagulation with Xarelto for recent DVT and pulmonary embolism.    Recommend changing anticoagulation from Xarelto to either full dose Lovenox or warfarin with Lovenox bridging after discussion with hematology  team.  Await consultation from Dr. Marin Olp today. Aggressive risk factor modification.  Follow-up as an outpatient in the stroke clinic in 6 weeks.  Stroke team will sign off.  Discussed with Dr. Wynelle Cleveland.  Greater than 50% time during this 25-minute visit was  spent on counseling and coordination of care about his embolic stroke and hypercoagulability from Covid illness and discussion about sleep apnea and answering questions.  Antony Contras, MD To contact Stroke Continuity provider, please refer to [email or web address] After hours, contact General Neurology

## 2019-05-04 NOTE — Progress Notes (Signed)
PROGRESS NOTE    [name]   [id number]  DOB: 07/28/1955  DOA: 04/08/2019 PCP: [name], FNP   Brief Narrative:  [name] is a 63 year old male with a right BKA, hypertension, hyperlipidemia, diabetes mellitus type 2 who was admitted to [facility] for COVID pneumonia on 11/15. He has been transferred to [facility] as of 12/9 for an acute CVA. His COVID pneumonia was complicated by a DVT, acute PE, thrombocytopenia and superimposed bacterial pneumonia and he has been receiving Xarelto.  His neurological symptoms include right hand weakness. An MRI obtained on 12/8 revealed > Multifocal acute ischemia within the left MCA territory, with largest area at the base of the left central sulcus. No hemorrhage or mass effect. - Single punctate focus of acute ischemia within the anterior right parietal white matter.   Subjective: His cough is improving. He remains short of breath when ambulating and cannot ambulate far due to his level of dyspnea.  Assessment & Plan:   Principal Problem:   Pneumonia due to COVID-19 virus/   Acute respiratory failure with hypoxia - This was treated with steroids, tocilizumab, convalescent plasma and antibiotics - he has been weaned down to 3 L O2 and 20 mg of Prednisone - still having pulse ox drops to 70-80s when ambulating - cont to wean Prednisone  Active Problems:  Acute LLE DVT and PE- he remains intermittently tachycardic and tachypneic - Possibly related to hypercoagulable state (due to [place]) - continues to be tachycardic and tachypneic at times - cont Xarelto    Acute ischemic left MCA stroke  - occurring while on Xarelto & aspirin 81 mg -   2 D ECHO show no thrombus- there is grade 1 dCHF- see report below -  carotid duplex shows no stenosis - LE venous duplex repeated on 12/10> acute deep vein thrombosis involving the left posterior tibial veins, and left peroneal veins. Findings suggest new clot progression as compared to previous  examination. DVT remains in left posterior tibial veins and is now visualized in the left peroneal veins. 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 Left ventricular ejection fraction, by visual estimation, is 60 to 65%. The left ventricle has normal function. There is no left ventricular hypertrophy.  2. Left ventricular diastolic parameters are consistent with Grade I diastolic dysfunction (impaired relaxation).  3. The left ventricle has no regional wall motion abnormalities.  4. Global right ventricle has normal systolic function.The right ventricular size is normal.  5. Left atrial size was normal.  6. Right atrial size was normal.  7. The mitral valve is normal in structure. No evidence of mitral valve regurgitation. No  evidence of mitral stenosis.  8. The tricuspid valve is normal in structure. Tricuspid valve regurgitation is not demonstrated.  9. The aortic valve is normal in structure. Aortic valve regurgitation is mild. Mild aortic valve sclerosis without stenosis. 10. The pulmonic valve was normal in structure. Pulmonic valve regurgitation is not visualized. 11. Normal LV systolic function; grade 1  diastolic dysfunction; mildly sclerotic aortic valve with mild AI.  Antimicrobials:  Anti-infectives (From admission, onward)   Start     Dose/Rate Route Frequency Ordered Stop   04/19/19 1130  cefTRIAXone (ROCEPHIN) 2 g in sodium chloride 0.9 % 100 mL IVPB     2 g 200 mL/hr over 30 Minutes Intravenous Every 24 hours 04/19/19 0932 04/26/19 1129   04/18/19 1130  cefTRIAXone (ROCEPHIN) 1 g in sodium chloride 0.9 % 100 mL IVPB  Status:  Discontinued     1 g 200 mL/hr over 30 Minutes Intravenous Every 24 hours 04/18/19 1123 04/19/19 0932   04/10/19 1000  remdesivir 100 mg in sodium chloride 0.9 % 250 mL IVPB     100 mg 500 mL/hr over 30 Minutes Intravenous Every 24 hours 04/09/19 1321 04/13/19 1933   04/09/19 2200  cefTRIAXone (ROCEPHIN) 2 g in sodium chloride 0.9 % 100 mL IVPB  Status:  Discontinued     2 g 200 mL/hr over 30 Minutes Intravenous Every 24 hours 04/09/19 0400 04/10/19 0827   04/09/19 2200  azithromycin (ZITHROMAX) 500 mg in sodium chloride 0.9 % 250 mL IVPB  Status:  Discontinued     500 mg 250 mL/hr over 60 Minutes Intravenous Every 24 hours 04/09/19 0400 04/10/19 0827   04/09/19 1400  remdesivir 200 mg in sodium chloride 0.9 % 250 mL IVPB     200 mg 500 mL/hr over 30 Minutes Intravenous Once 04/09/19 1321 04/09/19 1642   04/09/19 0000  cefTRIAXone (ROCEPHIN) 1 g in sodium chloride 0.9 % 100 mL IVPB     1 g 200 mL/hr over 30 Minutes Intravenous  Once 04/08/19 2353 04/09/19 0115   04/09/19 0000  azithromycin (ZITHROMAX) 500 mg in sodium chloride 0.9 % 250 mL IVPB     500 mg 250 mL/hr over 60 Minutes Intravenous  Once 04/08/19 2353 04/09/19 0132       Objective: Vitals:   05/03/19 2325 05/04/19 0416 05/04/19 0742 05/04/19 0820  BP:  124/77  117/81  Pulse: 84 91  91  Resp: (!) 21 18  20   Temp: 97.6 F (36.4 C) 98 F (36.7 C)  98.1 F (36.7 C)  TempSrc: Oral Oral  Oral  SpO2:  96% 96% 97%  Weight:   88.1 kg   Height:   5\' 6"  (1.676 m)     Intake/Output  Summary (Last 24 hours) at 05/04/2019 0927 Last data filed at 05/04/2019 0655 Gross per 24 hour  Intake --  Output 1000 ml  Net -1000 ml   Filed Weights   05/01/19 0455 05/02/19 0500 05/04/19 0742  Weight: 88 kg 88.1 kg 88.1 kg    Examination: General exam: Appears comfortable  HEENT: PERRLA, oral mucosa moist, no sclera icterus or thrush Respiratory system: Clear to auscultation. Respiratory effort normal. Cardiovascular system: S1 & S2 heard,  No murmurs  Gastrointestinal system: Abdomen soft, non-tender, nondistended. Normal bowel sounds   Central nervous system: Alert and oriented. No focal neurological deficits. Extremities: No cyanosis, clubbing or edema- right BKA Skin: No rashes or ulcers Psychiatry:  Mood & affect appropriate.     Data Reviewed: I  have personally reviewed following labs and imaging studies  CBC: Recent Labs  Lab 04/28/19 0141 04/29/19 0322 04/30/19 0740 05/01/19 0341 05/02/19 0610  WBC 10.3 10.8* 9.7 10.2 9.5  NEUTROABS 7.2 7.8* 6.7 7.2 6.5  HGB 15.2 14.8 15.2 14.8 15.0  HCT 46.0 44.8 46.2 45.1 46.0  MCV 95.2 93.9 94.9 94.7 96.4  PLT 79* 100* 121* 159 [id number]   Basic Metabolic Panel: Recent Labs  Lab 04/28/19 0141 04/29/19 0322 04/30/19 0740 05/01/19 0341 05/02/19 0610  NA 138 137 137 136 136  K 4.4 4.2 4.7 4.6 4.8  CL 96* 91* 91* 93* 93*  CO2 30 33* 33* 32 33*  GLUCOSE 114* 115* 123* 100* 110*  BUN 19 22 21 21 19   CREATININE 0.84 1.05 0.98 0.87 0.89  CALCIUM 8.9 8.8* 9.0 8.9 8.9  MG 2.1 2.2 2.1 2.2 2.2   GFR: Estimated Creatinine Clearance: 88.3 mL/min (by C-G formula based on SCr of 0.89 mg/dL). Liver Function Tests: Recent Labs  Lab 04/28/19 0141 04/29/19 0322 04/30/19 0740 05/01/19 0341 05/02/19 0610  AST 38 40 42* 47* 36  ALT 51* 64* 77* 82* 80*  ALKPHOS 70 72 71 71 68  BILITOT 1.2 1.5* 1.6* 1.9* 1.5*  PROT 6.9 7.1 7.2 7.2 6.9  ALBUMIN 3.4* 3.7 3.6 3.8 3.6   No results for input(s): LIPASE, AMYLASE in the last 168  hours. No results for input(s): AMMONIA in the last 168 hours. Coagulation Profile: No results for input(s): INR, PROTIME in the last 168 hours. Cardiac Enzymes: No results for input(s): CKTOTAL, CKMB, CKMBINDEX, TROPONINI in the last 168 hours. BNP (last 3 results) No results for input(s): PROBNP in the last 8760 hours. HbA1C: Recent Labs    05/03/19 0432  HGBA1C 6.8*   CBG: Recent Labs  Lab 05/03/19 0706 05/03/19 1146 05/03/19 1707 05/03/19 2112 05/04/19 0653  GLUCAP 96 126* 127* 193* 113*   Lipid Profile: Recent Labs    05/03/19 0432  CHOL 109  HDL 46  LDLCALC 54  TRIG 46  CHOLHDL 2.4   Thyroid Function Tests: No results for input(s): TSH, T4TOTAL, FREET4, T3FREE, THYROIDAB in the last 72 hours. Anemia Panel: Recent Labs    05/02/19 0610  FERRITIN 271   Urine analysis:    Component Value Date/Time   BILIRUBINUR negative 12/28/2018 1717   PROTEINUR Negative 12/28/2018 1717   UROBILINOGEN 0.2 12/28/2018 1717   NITRITE negative 12/28/2018 1717   LEUKOCYTESUR Negative 12/28/2018 1717   Sepsis Labs: @LABRCNTIP (procalcitonin:4,lacticidven:4) )No results found for this or any previous visit (from the past 240 hour(s)).       Radiology Studies: CT ANGIO HEAD W OR WO CONTRAST  Result Date: 05/03/2019 CLINICAL DATA:  Follow-up stroke.  Coronavirus infection. EXAM: CT ANGIOGRAPHY HEAD AND NECK TECHNIQUE: Multidetector CT imaging of the head and neck was performed using the standard protocol during bolus administration of intravenous contrast. Multiplanar CT image reconstructions and MIPs were obtained to evaluate the vascular anatomy. Carotid stenosis measurements (when applicable) are obtained utilizing NASCET criteria, using the distal internal carotid diameter as the denominator. CONTRAST:  63mL OMNIPAQUE IOHEXOL 350 MG/ML SOLN COMPARISON:  MRI 2 days ago FINDINGS: CT HEAD FINDINGS Brain: Small acute infarction shown by previous MRI are not visible by CT. The  CT examination is within normal limits. No sign of mass, hemorrhage, hydrocephalus or extra-axial collection. Vascular: There is atherosclerotic calcification of the major vessels at the base of the brain. Skull: Negative Sinuses: Clear Orbits: Normal Review of the MIP  images confirms the above findings CTA NECK FINDINGS Aortic arch: Mild atherosclerosis of the aortic arch. No origin stenosis. Branching pattern is normal without origin stenosis. Right carotid system: Common carotid artery widely patent to the bifurcation. No atherosclerotic disease at the bifurcation. Cervical ICA is tortuous but widely patent. Left carotid system: Common carotid artery widely patent to the bifurcation. No bifurcation atherosclerotic disease. Cervical ICA is tortuous but widely patent. Vertebral arteries: Vertebral artery origins are widely patent. Both vertebral arteries appear normal through the cervical region to the foramen magnum. Skeleton: Ordinary cervical spondylosis. Other neck: No mass or lymphadenopathy.  Mild thyroid enlargement. Upper chest: Hazy/patchy bilateral pulmonary infiltrates consistent with viral pneumonia. Review of the MIP images confirms the above findings CTA HEAD FINDINGS Anterior circulation: Both internal carotid arteries are patent through the skull base and siphon regions. No siphon stenosis. The anterior and middle cerebral vessels are patent without proximal stenosis, aneurysm or vascular malformation. No large or medium vessel occlusion. Posterior circulation: Both vertebral arteries are widely patent through the foramen magnum to the basilar. No basilar stenosis. Posterior circulation branch vessels are normal. Venous sinuses: Patent and normal. Anatomic variants: None significant. Review of the MIP images confirms the above findings IMPRESSION: No significant atherosclerotic disease. No stenosis. No intracranial large or medium vessel occlusion. Bilateral infarctions shown by MRI could be due to  micro embolic disease from the heart or could be due to hypercoagulable state with small-vessel infarctions. Electronically Signed   By: Nelson Chimes M.D.   On: 05/03/2019 18:22   CT ANGIO NECK W OR WO CONTRAST  Result Date: 05/03/2019 CLINICAL DATA:  Follow-up stroke.  Coronavirus infection. EXAM: CT ANGIOGRAPHY HEAD AND NECK TECHNIQUE: Multidetector CT imaging of the head and neck was performed using the standard protocol during bolus administration of intravenous contrast. Multiplanar CT image reconstructions and MIPs were obtained to evaluate the vascular anatomy. Carotid stenosis measurements (when applicable) are obtained utilizing NASCET criteria, using the distal internal carotid diameter as the denominator. CONTRAST:  20mL OMNIPAQUE IOHEXOL 350 MG/ML SOLN COMPARISON:  MRI 2 days ago FINDINGS: CT HEAD FINDINGS Brain: Small acute infarction shown by previous MRI are not visible by CT. The CT examination is within normal limits. No sign of mass, hemorrhage, hydrocephalus or extra-axial collection. Vascular: There is atherosclerotic calcification of the major vessels at the base of the brain. Skull: Negative Sinuses: Clear Orbits: Normal Review of the MIP images confirms the above findings CTA NECK FINDINGS Aortic arch: Mild atherosclerosis of the aortic arch. No origin stenosis. Branching pattern is normal without origin stenosis. Right carotid system: Common carotid artery widely patent to the bifurcation. No atherosclerotic disease at the bifurcation. Cervical ICA is tortuous but widely patent. Left carotid system: Common carotid artery widely patent to the bifurcation. No bifurcation atherosclerotic disease. Cervical ICA is tortuous but widely patent. Vertebral arteries: Vertebral artery origins are widely patent. Both vertebral arteries appear normal through the cervical region to the foramen magnum. Skeleton: Ordinary cervical spondylosis. Other neck: No mass or lymphadenopathy.  Mild thyroid  enlargement. Upper chest: Hazy/patchy bilateral pulmonary infiltrates consistent with viral pneumonia. Review of the MIP images confirms the above findings CTA HEAD FINDINGS Anterior circulation: Both internal carotid arteries are patent through the skull base and siphon regions. No siphon stenosis. The anterior and middle cerebral vessels are patent without proximal stenosis, aneurysm or vascular malformation. No large or medium vessel occlusion. Posterior circulation: Both vertebral arteries are widely patent through the foramen magnum to the basilar.  No basilar stenosis. Posterior circulation branch vessels are normal. Venous sinuses: Patent and normal. Anatomic variants: None significant. Review of the MIP images confirms the above findings IMPRESSION: No significant atherosclerotic disease. No stenosis. No intracranial large or medium vessel occlusion. Bilateral infarctions shown by MRI could be due to micro embolic disease from the heart or could be due to hypercoagulable state with small-vessel infarctions. Electronically Signed   By: Nelson Chimes M.D.   On: 05/03/2019 18:22   VAS US CAROTID (at [facility] and [facility] only)  Result Date: 05/03/2019 Carotid Arterial Duplex Study Indications:       CVA, Weakness and Covid positive, positive for DVT. Risk Factors:      Hypertension, hyperlipidemia. Limitations        Today's exam was limited due to the high bifurcation of the                    carotid. Comparison Study:  No prior study on file for comparison Performing Technologist: Sharion Dove RVS  Examination Guidelines: A complete evaluation includes B-mode imaging, spectral Doppler, color Doppler, and power Doppler as needed of all accessible portions of each vessel. Bilateral testing is considered an integral part of a complete examination. Limited examinations for reoccurring indications may be performed as noted.  Right Carotid Findings: +----------+--------+--------+--------+------------------+--------+              PSV cm/s EDV cm/s Stenosis Plaque Description Comments  +----------+--------+--------+--------+------------------+--------+  CCA Prox   132      18                homogeneous                  +----------+--------+--------+--------+------------------+--------+  CCA Distal 116      15                homogeneous                  +----------+--------+--------+--------+------------------+--------+  ICA Prox   72       16                homogeneous                  +----------+--------+--------+--------+------------------+--------+  ICA Distal 89                                                      +----------+--------+--------+--------+------------------+--------+  ECA        134      18                                             +----------+--------+--------+--------+------------------+--------+ +---------+--------+--+--------+--+  Vertebral PSV cm/s 71 EDV cm/s 18  +---------+--------+--+--------+--+  Left Carotid Findings: +----------+--------+--------+--------+------------------+------------------+             PSV cm/s EDV cm/s Stenosis Plaque Description Comments            +----------+--------+--------+--------+------------------+------------------+  CCA Prox   157      25  intimal thickening  +----------+--------+--------+--------+------------------+------------------+  CCA Distal 126      24                                   intimal thickening  +----------+--------+--------+--------+------------------+------------------+  ICA Prox   63       9                 homogeneous                            +----------+--------+--------+--------+------------------+------------------+  ICA Distal 114      37                                                       +----------+--------+--------+--------+------------------+------------------+  ECA        135      14                                                        +----------+--------+--------+--------+------------------+------------------+ +----------+--------+--------+--------+-------------------+             PSV cm/s EDV cm/s Describe Arm Pressure (mmHG)  +----------+--------+--------+--------+-------------------+  Subclavian 232                                             +----------+--------+--------+--------+-------------------+ +---------+--------+--+--------+-+  Vertebral PSV cm/s 53 EDV cm/s 7  +---------+--------+--+--------+-+  Summary: Right Carotid: The extracranial vessels were near-normal with only minimal wall                thickening or plaque. Left Carotid: The extracranial vessels were near-normal with only minimal wall               thickening or plaque. Vertebrals:  Bilateral vertebral arteries demonstrate antegrade flow. Subclavians: Normal flow hemodynamics were seen in bilateral subclavian              arteries. *See table(s) above for measurements and observations.  Electronically signed by Antony Contras MD on 05/03/2019 at 1:13:23 PM.    Final    VAS [place] LOWER EXTREMITY VENOUS (DVT)  Result Date: 05/03/2019  Lower Venous Study Indications: Stroke, and Covid positive, recent history of left posterior tibial DVT found 04/10/19.  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 Comparison Study: Prior study on file for comparison from 04/10/19 Performing Technologist: Sharion Dove RVS  Examination Guidelines: A complete evaluation includes B-mode imaging, spectral Doppler, color Doppler, and power Doppler as needed of all accessible portions of each vessel. Bilateral testing is considered an integral part of a complete examination. Limited examinations for reoccurring indications may be performed as noted.  +---------+---------------+---------+-----------+----------+-------------------+  RIGHT     Compressibility Phasicity Spontaneity Properties Thrombus Aging       +---------+---------------+---------+-----------+----------+-------------------+  CFV        Full            Yes       Yes                                         +---------+---------------+---------+-----------+----------+-------------------+  SFJ       Full                                                                  +---------+---------------+---------+-----------+----------+-------------------+  FV Prox   Full                                                                  +---------+---------------+---------+-----------+----------+-------------------+  FV Mid    Full                                                                  +---------+---------------+---------+-----------+----------+-------------------+  FV Distal Full                                                                  +---------+---------------+---------+-----------+----------+-------------------+  PFV       Full                                                                  +---------+---------------+---------+-----------+----------+-------------------+  POP                                                        patent by color and                                                              Doppler              +---------+---------------+---------+-----------+----------+-------------------+   +---------+---------------+---------+-----------+----------+--------------+  LEFT      Compressibility Phasicity Spontaneity Properties Thrombus Aging  +---------+---------------+---------+-----------+----------+--------------+  CFV       Full            Yes       Yes                                    +---------+---------------+---------+-----------+----------+--------------+  SFJ       Full                                                             +---------+---------------+---------+-----------+----------+--------------+  FV Prox   Full                                                             +---------+---------------+---------+-----------+----------+--------------+  FV Mid    Full                                                              +---------+---------------+---------+-----------+----------+--------------+  FV Distal Full                                                             +---------+---------------+---------+-----------+----------+--------------+  PFV       Full                                                             +---------+---------------+---------+-----------+----------+--------------+  POP       Full            Yes       Yes                                    +---------+---------------+---------+-----------+----------+--------------+  PTV       None                                             Acute           +---------+---------------+---------+-----------+----------+--------------+  PERO      None                                             Acute           +---------+---------------+---------+-----------+----------+--------------+     Summary: Right: There is no evidence of deep vein thrombosis in the lower extremity. Left: Findings consistent with acute deep vein thrombosis involving the left posterior tibial veins, and left peroneal veins. Findings suggest new clot progression as compared to previous examination. DVT remains in left posterior tibial veins and is now  visualized in the left peroneal veins.  *See table(s) above for measurements and observations. Electronically signed by Monica Martinez MD on 05/03/2019 at 1:50:02 PM.    Final    ECHOCARDIOGRAM LIMITED  Result Date: 05/03/2019   ECHOCARDIOGRAM LIMITED REPORT   Patient Name:   [name] Date of Exam: 05/03/2019 Medical Rec #:  [id number]       Height:       66.0 in Accession #:  [id number]      Weight:       194.1 lb Date of Birth:  Mar 07, 1956       BSA:          1.97 m Patient Age:    89 years        BP:           134/90 mmHg Patient Gender: M               HR:           94 bpm. Exam Location:  Inpatient  Procedure: Limited Echo, Cardiac Doppler and Limited Color Doppler Indications:    stroke 434.91  History:        Patient  has prior history of Echocardiogram examinations, most                 recent 04/10/2019.  Sonographer:    Johny Chess Referring Phys: [id number] Patrecia Pour  Sonographer Comments: Image acquisition challenging due to respiratory motion. IMPRESSIONS  1. Left ventricular ejection fraction, by visual estimation, is 60 to 65%. The left ventricle has normal function. There is no left ventricular hypertrophy.  2. Left ventricular diastolic parameters are consistent with Grade I diastolic dysfunction (impaired relaxation).  3. The left ventricle has no regional wall motion abnormalities.  4. Global right ventricle has normal systolic function.The right ventricular size is normal.  5. Left atrial size was normal.  6. Right atrial size was normal.  7. The mitral valve is normal in structure. No evidence of mitral valve regurgitation. No evidence of mitral stenosis.  8. The tricuspid valve is normal in structure. Tricuspid valve regurgitation is not demonstrated.  9. The aortic valve is normal in structure. Aortic valve regurgitation is mild. Mild aortic valve sclerosis without stenosis. 10. The pulmonic valve was normal in structure. Pulmonic valve regurgitation is not visualized. 11. Normal LV systolic function; grade 1 diastolic dysfunction; mildly sclerotic aortic valve with mild AI. FINDINGS  Left Ventricle: Left ventricular ejection fraction, by visual estimation, is 60 to 65%. The left ventricle has normal function. The left ventricle has no regional wall motion abnormalities. There is no left ventricular hypertrophy. Left ventricular diastolic parameters are consistent with Grade I diastolic dysfunction (impaired relaxation). Normal left atrial pressure. Right Ventricle: The right ventricular size is normal.Global RV systolic function is has normal systolic function. Left Atrium: Left atrial size was normal in size. Right Atrium: Right atrial size was normal in size Pericardium: There is no evidence of pericardial  effusion. Mitral Valve: The mitral valve is normal in structure. No evidence of mitral valve stenosis by observation. MV Area by PHT, 3.17 cm. MV PHT, 69.31 msec. No evidence of mitral valve regurgitation. Tricuspid Valve: The tricuspid valve is normal in structure. Tricuspid valve regurgitation is not demonstrated. Aortic Valve: The aortic valve is normal in structure. Aortic valve regurgitation is mild. Mild aortic valve sclerosis is present, with no evidence of aortic valve stenosis. Pulmonic Valve: The pulmonic valve was normal in structure. Pulmonic valve regurgitation is not visualized. Aorta: The aortic root, ascending aorta and aortic arch are all structurally normal, with no evidence of dilitation or obstruction. Venous: The inferior vena cava was not well visualized.   LEFT VENTRICLE          Normals PLAX 2D LVIDd:         3.87 cm  3.6 cm   Diastology  Normals LVIDs:         2.75 cm  1.7 cm   LV e' lateral:   10.80 cm/s 6.42 cm/s LV PW:         1.04 cm  1.4 cm   LV E/e' lateral: 9.4        15.4 LV IVS:        1.00 cm  1.3 cm   LV e' medial:    7.29 cm/s  6.96 cm/s LVOT diam:     2.40 cm  2.0 cm   LV E/e' medial:  13.9       6.96 LV SV:         36 ml    79 ml LV SV Index:   17.74    45 ml/m2 LVOT Area:     4.52 cm 3.14 cm2  LEFT ATRIUM         Index LA diam:    3.50 cm 1.77 cm/m  AORTIC VALVE             Normals LVOT Vmax:   116.00 cm/s LVOT Vmean:  77.600 cm/s 75 cm/s LVOT VTI:    0.236 m     25.3 cm  AORTA                 Normals Ao Root diam: 3.30 cm 31 mm Ao Asc diam:  3.40 cm 31 mm MITRAL VALVE              Normals MV Area (PHT): 3.17 cm              SHUNTS MV PHT:        69.31 msec 55 ms      Systemic VTI:  0.24 m MV Decel Time: 239 msec   187 ms     Systemic Diam: 2.40 cm MV E velocity: 101.00 cm/s 103 cm/s MV A velocity: 115.00 cm/s 70.3 cm/s MV E/A ratio:  0.88        1.5  Kirk Ruths MD Electronically signed by Kirk Ruths MD Signature Date/Time: 05/03/2019/9:22:25 AM     Final       Scheduled Meds:  aspirin  81 mg Oral Daily   atorvastatin  40 mg Oral q1800   chlorpheniramine-HYDROcodone  5 mL Oral Q12H   famotidine  20 mg Oral BID   fluticasone  2 spray Each Nare BID   insulin aspart  0-9 Units Subcutaneous TID WC   Ipratropium-Albuterol  1 puff Inhalation BID   [START ON 05/05/2019] predniSONE  10 mg Oral Q breakfast   sodium chloride flush  10-40 mL Intracatheter Q12H   tamsulosin  0.4 mg Oral QPC supper   ursodiol  300 mg Oral BID   vitamin C  500 mg Oral Daily   zinc sulfate  220 mg Oral Daily   Continuous Infusions:  heparin       LOS: 25 days      Debbe Odea, MD [facility] Pager: [email or web address] Password [facility] 05/04/2019, 9:27 AM

## 2019-05-04 NOTE — Progress Notes (Signed)
Physical Therapy Treatment Patient Details Name: [name] MRN: [id number] DOB: Jan 10, 1956 Today's Date: 05/04/2019    History of Present Illness 63 y/o male w/ hx liver disorder, HTN, HLD, R BKA, GERD presented to ED with SOB, cough, fever and chills, admitted with dx acute hypoxic resp failure sec to SARS COVID 19. Follow-up CT chest showed segmental and subsegmental PE within branches of the right lower lobe. Patient noted to have rt hand weakness and incoordination; head CT 04/17/19 showing no acute changes. 04/23/19 pt reports no improvement and no worsening of rt hand. MRI 12/8 with multifocal acute ischemia in L MCA territory.    PT Comments    Pt progressing well towards his physical therapy goals. Session focused on chair exercises with increased repetitions to promote endurance and progressive ambulation. Pt ambulating 30 feet with walker and min guard assist. SpO2 96% on 2.5 L at rest, desaturation to 84% with mobility with HR peak 128 bpm. Recovered well with pursed lip breathing. Will continue to progress as tolerated.    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) Restrictions Weight Bearing Restrictions: No    Mobility  Bed Mobility               General bed mobility comments: Out of bed in chair  Transfers Overall transfer level: Needs assistance Equipment used: Rolling walker (2 wheeled) Transfers: Sit to/from Stand Sit to Stand: Supervision            Ambulation/Gait Ambulation/Gait assistance: Min guard;Supervision Gait Distance (Feet): 30 Feet Assistive device: Rolling walker (2 wheeled) Gait Pattern/deviations: Decreased stride length Gait velocity: decreased Gait  velocity interpretation: <1.8 ft/sec, indicate of risk for recurrent falls General Gait Details: Cues for keeping walker on ground, one mild lateral LOB requiring min guard to correct   Stairs             Wheelchair Mobility    Modified Rankin (Stroke Patients Only) Modified Rankin (Stroke Patients Only) Pre-Morbid Rankin Score: No symptoms Modified Rankin: Moderately severe disability     Balance Overall balance assessment: Needs assistance Sitting-balance support: Feet supported Sitting balance-Leahy Scale: Normal     Standing balance support: Bilateral upper extremity supported;During functional activity Standing balance-Leahy Scale: Fair                              Cognition Arousal/Alertness: Awake/alert Behavior During Therapy: WFL for tasks assessed/performed Overall Cognitive Status: Impaired/Different from baseline Area of Impairment: Problem solving                             Problem Solving: Slow processing;Requires verbal cues General Comments: Pt with mildly delayed processing; unable to problem solve how to play his music on his phone      Exercises General Exercises - Upper Extremity Shoulder Flexion: Both;15 reps;Seated Elbow Flexion: Both;20 reps;Seated General Exercises - Lower Extremity Long Arc Quad: Both;15 reps;Seated Hip Flexion/Marching: Both;Seated;20 reps Other Exercises Other Exercises: Seated: lat pulldowns x 10, arm circles x 20    General Comments        Pertinent Vitals/Pain Pain Assessment: No/denies pain    Home Living  Prior Function            PT Goals (current goals can now be found in the care plan section) Acute Rehab PT Goals Patient Stated Goal: "work on exercises." Progress towards PT goals: Progressing toward goals    Frequency    Min 3X/week      PT Plan Current plan remains appropriate    Co-evaluation              AM-PAC PT "6  Clicks" Mobility   Outcome Measure  Help needed turning from your back to your side while in a flat bed without using bedrails?: None Help needed moving from lying on your back to sitting on the side of a flat bed without using bedrails?: None Help needed moving to and from a bed to a chair (including a wheelchair)?: None Help needed standing up from a chair using your arms (e.g., wheelchair or bedside chair)?: A Little Help needed to walk in hospital room?: A Little Help needed climbing 3-5 steps with a railing? : Total 6 Click Score: 19    End of Session Equipment Utilized During Treatment: Gait belt;Oxygen Activity Tolerance: Patient tolerated treatment well Patient left: with call bell/phone within reach;in chair Nurse Communication: Mobility status PT Visit Diagnosis: Muscle weakness (generalized) (M62.81);Other abnormalities of gait and mobility (R26.89)     Time: 1438-1500 PT Time Calculation (min) (ACUTE ONLY): 22 min  Charges:  $Therapeutic Exercise: 8-22 mins                     Ellamae Sia, PT, DPT Acute Rehabilitation Services Pager [phone] Office [phone]    Willy Eddy 05/04/2019, 4:46 PM

## 2019-05-05 LAB — CBC
HCT: 41.1 % (ref 39.0–52.0)
Hemoglobin: 13.4 g/dL (ref 13.0–17.0)
MCH: 31.4 pg (ref 26.0–34.0)
MCHC: 32.6 g/dL (ref 30.0–36.0)
MCV: 96.3 fL (ref 80.0–100.0)
Platelets: 215 10*3/uL (ref 150–400)
RBC: 4.27 MIL/uL (ref 4.22–5.81)
RDW: 14.3 % (ref 11.5–15.5)
WBC: 9.5 10*3/uL (ref 4.0–10.5)
nRBC: 0 % (ref 0.0–0.2)

## 2019-05-05 LAB — GLUCOSE, CAPILLARY
Glucose-Capillary: 93 mg/dL (ref 70–99)
Glucose-Capillary: 178 mg/dL — ABNORMAL HIGH (ref 70–99)
Glucose-Capillary: 148 mg/dL — ABNORMAL HIGH (ref 70–99)
Glucose-Capillary: 130 mg/dL — ABNORMAL HIGH (ref 70–99)

## 2019-05-05 LAB — PROTEIN C, TOTAL: Protein C, Total: 145 % (ref 60–150)

## 2019-05-05 LAB — PROTEIN C ACTIVITY: Protein C Activity: 196 % — ABNORMAL HIGH (ref 73–180)

## 2019-05-05 LAB — PROTIME-INR
INR: 1 (ref 0.8–1.2)
Prothrombin Time: 13.3 seconds (ref 11.4–15.2)

## 2019-05-05 LAB — APTT
aPTT: 52 seconds — ABNORMAL HIGH (ref 24–36)
aPTT: 59 seconds — ABNORMAL HIGH (ref 24–36)
aPTT: 59 seconds — ABNORMAL HIGH (ref 24–36)

## 2019-05-05 LAB — PROTEIN S ACTIVITY: Protein S Activity: 72 % (ref 63–140)

## 2019-05-05 LAB — HOMOCYSTEINE: Homocysteine: 14.9 umol/L (ref 0.0–17.2)

## 2019-05-05 LAB — PROTEIN S, TOTAL: Protein S Ag, Total: 97 % (ref 60–150)

## 2019-05-05 LAB — HEPARIN LEVEL (UNFRACTIONATED): Heparin Unfractionated: 0.7 IU/mL (ref 0.30–0.70)

## 2019-05-05 MED ORDER — WARFARIN SODIUM 7.5 MG PO TABS
7.5000 mg | ORAL_TABLET | Freq: Once | ORAL | Status: AC
  Administered 2019-05-05: 7.5 mg via ORAL
  Filled 2019-05-05: qty 1

## 2019-05-05 NOTE — Progress Notes (Signed)
ANTICOAGULATION CONSULT NOTE - Initial Consult  Pharmacy Consult for Coumadin and Heparin  Indication: DVT, PE, and CVA  No Known Allergies  Patient Measurements: Height: 5\' 6"  (167.6 cm) Weight: 194 lb 3.6 oz (88.1 kg) IBW/kg (Calculated) : 63.8 Heparin Dosing Weight: 82.3 kg  Vital Signs: Temp: 97.5 F (36.4 C) (12/12 0925) Temp Source: Oral (12/12 0925) BP: 125/83 (12/12 0925) Pulse Rate: 80 (12/12 0925)  Labs: Recent Labs    05/04/19 0835 05/05/19 0218 05/05/19 0952  HGB  --  13.4  --   HCT  --  41.1  --   PLT  --  215  --   APTT 33 52* 59*  LABPROT  --  13.3  --   INR  --  1.0  --   HEPARINUNFRC >2.20* 0.70  --     Estimated Creatinine Clearance: 88.3 mL/min (by C-G formula based on SCr of 0.89 mg/dL).   Medical History: Past Medical History:  Diagnosis Date  . Diabetes mellitus without complication ([place])   . GERD (gastroesophageal reflux disease)   . 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 Hyperlipidemia   . Hypertension   . Liver disorder    tx with ursodiol     Assessment: Patients INR 1.0 today, received one dose of warfarin on 12/11 will continue to give 7.5mg  tonight. Patients aPTT was 0.59, will increase from 1300 to 1400 units/hr and re-check a level in 6 hours from the change in rate.  Goal of Therapy:  INR 2-3 Monitor platelets by anticoagulation protocol: Yes  APTT 66-85 seconds   Plan:  Warfarin 7.5mg  x1 Increase Heparin to 1400 units/hr 6 hour aPTT Daily INR and aPTT   Nicoletta Dress, PharmD PGY2 Infectious Disease Pharmacy Resident  [name] 05/05/2019,11:10 AM

## 2019-05-05 NOTE — Progress Notes (Signed)
ANTICOAGULATION CONSULT NOTE  Pharmacy Consult for Heparin  Indication: DVT, PE, and CVA  No Known Allergies  Patient Measurements: Height: 5\' 6"  (167.6 cm) Weight: 194 lb 3.6 oz (88.1 kg) IBW/kg (Calculated) : 63.8 Heparin Dosing Weight: 82.3 kg  Vital Signs: Temp: 98.2 F (36.8 C) (12/12 1554) Temp Source: Oral (12/12 1554) BP: 125/75 (12/12 1554) Pulse Rate: 85 (12/12 1554)  Labs: Recent Labs    05/04/19 0835 05/05/19 0218 05/05/19 0952 05/05/19 1711  HGB  --  13.4  --   --   HCT  --  41.1  --   --   PLT  --  215  --   --   APTT 33 52* 59* 59*  LABPROT  --  13.3  --   --   INR  --  1.0  --   --   HEPARINUNFRC >2.20* 0.70  --   --     Estimated Creatinine Clearance: 88.3 mL/min (by C-G formula based on SCr of 0.89 mg/dL).   Assessment: 29 YOM to continue on IV heparin for recent PE/DVT and CVA.  Currently using aPTT to guide heparin dosing since Xarelto could falsely elevate heparin levels.  APTT is sub-therapeutic and unchanged.  No issue with heparin infusion nor bleeding per RN.  Goal of Therapy:  INR 2-3 Monitor platelets by anticoagulation protocol: Yes  APTT 66-85 seconds   Plan:  Increase heparin gtt to 1550 units/hr Check 6 hr aPTT  Kynzi Levay D. Mina Marble, PharmD, BCPS, [place] 05/05/2019, 6:50 PM

## 2019-05-05 NOTE — Progress Notes (Signed)
ANTICOAGULATION CONSULT NOTE - Follow Up Consult  Pharmacy Consult for heparin Indication: PE/DVT in setting of CVA  Labs: Recent Labs    05/02/19 0610 05/04/19 0835 05/05/19 0218  HGB 15.0  --  13.4  HCT 46.0  --  41.1  PLT 180  --  215  APTT  --  33 52*  HEPARINUNFRC  --  >2.20* 0.70  CREATININE 0.89  --   --     Assessment: 63yo male subtherapeutic on heparin with initial dosing while transitioning from Xarelto to Coumadin; no gtt issues or signs of bleeding per RN.  Goal of Therapy:  aPTT 66-85 seconds   Plan:  Will increase heparin gtt by ~1 unit/kg/hr to 1300 units/hr and check PTT in 6 hours.    Wynona Neat, PharmD, BCPS  05/05/2019,3:23 AM

## 2019-05-05 NOTE — Progress Notes (Signed)
Physical Therapy Treatment Patient Details Name: [name] MRN: [id number] DOB: 10-31-1955 Today's Date: 05/05/2019    History of Present Illness 63 y/o male w/ hx liver disorder, HTN, HLD, R BKA, GERD presented to ED with SOB, cough, fever and chills, admitted with dx acute hypoxic resp failure sec to SARS COVID 19. Follow-up CT chest showed segmental and subsegmental PE within branches of the right lower lobe. Patient noted to have rt hand weakness and incoordination; head CT 04/17/19 showing no acute changes. 04/23/19 pt reports no improvement and no worsening of rt hand. MRI 12/8 with multifocal acute ischemia in L MCA territory.    PT Comments    Mr. Ishee was eager and motivated to work with his functional mobility and strength today. Doubled his ambulatory distance with one standing rest break of 90 seconds between bouts today, using a rolling walker for support and min guard for safety. SpO2 87% after first lap of 40 feet while utilizing 4L supplemental O2. Improved back to baseline of low 90s and SpO2 dropped to 79% after second bout, which improved back to baseline within 75 sec and a seated break still with 4L supplemental O2. Progressed with some lower extremity exercises to diminish muscular atrophy. In good spirits and eager to improve. Patient will continue to benefit from skilled physical therapy services to further improve independence with functional mobility.    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 Good control upon standing with RW for support.  Ambulation/Gait Ambulation/Gait assistance: Min guard;Supervision Gait Distance (Feet): 40 Feet(x2) Assistive device: Rolling walker (2 wheeled) Gait Pattern/deviations: Decreased stride length Gait velocity: decreased Gait velocity interpretation: <1.8 ft/sec, indicate of risk for recurrent falls General Gait Details: No LOB noted today. Slow but steady, navigated around objects in room from window to entrance and back x2 with one prolonged standing rest break while on 4L supplemental O2. See comment section for vitals.   Stairs             Wheelchair Mobility    Modified Rankin (Stroke Patients Only) Modified Rankin (Stroke Patients Only) Pre-Morbid Rankin Score: No symptoms Modified Rankin: Moderately severe disability     Balance Overall balance assessment: Needs assistance Sitting-balance support: Feet supported Sitting balance-Leahy Scale: Normal     Standing balance support: During functional activity Standing balance-Leahy Scale: Fair                              Cognition Arousal/Alertness: Awake/alert Behavior During Therapy: WFL for tasks assessed/performed Overall Cognitive Status: Within Functional Limits for tasks assessed  General Comments: Issues with problem solving not observed today. Continue to monitor.      Exercises General Exercises - Lower Extremity Ankle Circles/Pumps: AROM;10 reps;Seated;Left Gluteal Sets: Strengthening;10 reps;Seated;Both Long Arc Quad: Both;Seated;10 reps Hip ABduction/ADduction: Strengthening;Both;10 reps;Seated Hip Flexion/Marching: Strengthening;10 reps     General Comments General comments (skin integrity, edema, etc.): SpO2 on 3L at start of session resting at 91%, with first bout of gait pt SpO2 down to 87%, rebounded to 90% within 90 seconds while standing. After second bout, SpO2 briefly 79% and upon sitting returned to 89% within 75 seconds. Significant DOE with gait, improved adequately with rest. Returned to 3L at end of session for seated exercises and SpO2 ranged between 89-94%.      Pertinent Vitals/Pain Pain Assessment: No/denies pain Pain Intervention(s): Monitored during session    Home Living                      Prior Function            PT Goals (current goals can now be found in the care plan section) Acute Rehab PT Goals Patient Stated Goal: Get his strength and endurance back Progress towards PT goals: Progressing toward goals    Frequency    Min 3X/week      PT Plan Current plan remains appropriate    Co-evaluation              AM-PAC PT "6 Clicks" Mobility   Outcome Measure  Help needed turning from your back to your side while in a flat bed without using bedrails?: None Help needed moving from lying on your back to sitting on the side of a flat bed without using bedrails?: None Help needed moving to and from a bed to a chair (including a wheelchair)?: None Help needed standing up from a chair using your arms (e.g., wheelchair or bedside chair)?: A Little Help needed to walk in hospital room?: A Little Help needed climbing 3-5 steps with a railing? : Total 6 Click Score: 19    End of Session Equipment Utilized During Treatment: Gait belt;Oxygen Activity Tolerance: Patient tolerated treatment well Patient left: with call bell/phone within reach;in chair;with nursing/sitter in room Nurse Communication: Mobility status PT Visit Diagnosis: Muscle weakness (generalized) (M62.81);Other abnormalities of gait and mobility (R26.89)     Time: [id number] PT Time Calculation (min) (ACUTE  ONLY): 37 min  Charges:  $Gait Training: 8-22 mins $Therapeutic Exercise: 8-22 mins                     [employer], PT    Ellouise Newer 05/05/2019, 1:27 PM

## 2019-05-05 NOTE — Progress Notes (Signed)
PROGRESS NOTE    [name]   [id number]  DOB: 04/24/1956  DOA: 04/08/2019 PCP: [name], FNP   Brief Narrative:  [name] is a 63 year old male with a right BKA, hypertension, hyperlipidemia, diabetes mellitus type 2 who was admitted to [facility] for COVID pneumonia on 11/15. He has been transferred to [facility] as of 12/9 for an acute CVA. His COVID pneumonia was complicated by a DVT, acute PE, thrombocytopenia and superimposed bacterial pneumonia and he has been receiving Xarelto.  His neurological symptoms include right hand weakness. An MRI obtained on 12/8 revealed > Multifocal acute ischemia within the left MCA territory, with largest area at the base of the left central sulcus. No hemorrhage or mass effect. - Single punctate focus of acute ischemia within the anterior right parietal white matter.   Subjective: He has no new complaints. Continues to be very short of breath when ambulating. Cough improving.   Assessment & Plan:   Principal Problem:   Pneumonia due to COVID-19 virus/   Acute respiratory failure with hypoxia - This was treated with steroids, tocilizumab, convalescent plasma and antibiotics - he has been weaned down to 3 L O2 and 10 mg of Prednisone - still having pulse ox drops to 70-80s when ambulating - cont to wean Prednisone and O2 as able  Active Problems:  Acute LLE DVT and PE- he remains intermittently tachycardic and tachypneic - Possibly related to hypercoagulable state (due to COVID) - continues to be tachycardic and tachypneic at times - Xarelto transitioned to Heparin/Coumadin    Acute ischemic left MCA stroke  - occurring while on Xarelto & aspirin 81 mg -   2 D ECHO show no thrombus- there is grade 1 dCHF- see report below -  carotid duplex shows no stenosis - LE venous duplex repeated on 12/10> acute deep vein thrombosis involving the left posterior tibial veins, and left peroneal veins. Findings suggest new clot progression as  compared to previous examination. DVT remains in left posterior tibial veins and is now visualized in the left peroneal veins. - LDL 54 (this was 122 in 12/28/18) - A1c 6.8 - Transcranial dopplers with bubble study is negative - stroke team has evaluated him and Dr Leonie Man recommends a heme consult to determine if Xarelto is the appropriate anticoagulant for him- hematology has transitioned him to Coumadin with Heparin bridge- awaiting therapeutic INR  DM2 with hyperglycemia - A1c is 6.8- he has been on SSI while in the hospital- previously diet controlled  AKI - Cr rose to 1.46 on 04/08/19 and has subsequently trended down to 0.89   Gout flare of 1st left MTP - improved with Prednisone  RLE BKA  Essential HTN - Diovan/HCTZ on hold in setting of acute CVA- BP quite stable  BPH - cont Flomax   Time spent in minutes: 35 DVT prophylaxis: Xarelto Code Status: Full code Family Communication:  Disposition Plan: home when INR therapeutic Consultants:   ID and hematology by phone  Neurology  12/11- hematology Procedures:    2 D ECHO 1. Left ventricular ejection fraction, by visual estimation, is 60 to 65%. The left ventricle has normal function. There is no left ventricular hypertrophy.  2. Left ventricular diastolic parameters are consistent with Grade I diastolic dysfunction (impaired relaxation).  3. The left ventricle has no regional wall motion abnormalities.  4. Global right ventricle has normal systolic function.The right ventricular size is normal.  5. Left atrial size was normal.  6. Right atrial size was  normal.  7. The mitral valve is normal in structure. No evidence of mitral valve regurgitation. No evidence of mitral stenosis.  8. The tricuspid valve is normal in structure. Tricuspid valve regurgitation is not demonstrated.  9. The aortic valve is normal in structure. Aortic valve regurgitation is mild. Mild aortic valve sclerosis without stenosis. 10. The pulmonic  valve was normal in structure. Pulmonic valve regurgitation is not visualized. 11. Normal LV systolic function; grade 1 diastolic dysfunction; mildly sclerotic aortic valve with mild AI.  Antimicrobials:  Anti-infectives (From admission, onward)   Start     Dose/Rate Route Frequency Ordered Stop   04/19/19 1130  cefTRIAXone (ROCEPHIN) 2 g in sodium chloride 0.9 % 100 mL IVPB     2 g 200 mL/hr over 30 Minutes Intravenous Every 24 hours 04/19/19 0932 04/26/19 1129   04/18/19 1130  cefTRIAXone (ROCEPHIN) 1 g in sodium chloride 0.9 % 100 mL IVPB  Status:  Discontinued     1 g 200 mL/hr over 30 Minutes Intravenous Every 24 hours 04/18/19 1123 04/19/19 0932   04/10/19 1000  remdesivir 100 mg in sodium chloride 0.9 % 250 mL IVPB     100 mg 500 mL/hr over 30 Minutes Intravenous Every 24 hours 04/09/19 1321 04/13/19 1933   04/09/19 2200  cefTRIAXone (ROCEPHIN) 2 g in sodium chloride 0.9 % 100 mL IVPB  Status:  Discontinued     2 g 200 mL/hr over 30 Minutes Intravenous Every 24 hours 04/09/19 0400 04/10/19 0827   04/09/19 2200  azithromycin (ZITHROMAX) 500 mg in sodium chloride 0.9 % 250 mL IVPB  Status:  Discontinued     500 mg 250 mL/hr over 60 Minutes Intravenous Every 24 hours 04/09/19 0400 04/10/19 0827   04/09/19 1400  remdesivir 200 mg in sodium chloride 0.9 % 250 mL IVPB     200 mg 500 mL/hr over 30 Minutes Intravenous Once 04/09/19 1321 04/09/19 1642   04/09/19 0000  cefTRIAXone (ROCEPHIN) 1 g in sodium chloride 0.9 % 100 mL IVPB     1 g 200 mL/hr over 30 Minutes Intravenous  Once 04/08/19 2353 04/09/19 0115   04/09/19 0000  azithromycin (ZITHROMAX) 500 mg in sodium chloride 0.9 % 250 mL IVPB     500 mg 250 mL/hr over 60 Minutes Intravenous  Once 04/08/19 2353 04/09/19 0132       Objective: Vitals:   05/05/19 0042 05/05/19 0428 05/05/19 0758 05/05/19 0925  BP: 135/74 126/83  125/83  Pulse:  80 80 80  Resp: 16 18 18 20   Temp: 97.8 F (36.6 C) 97.7 F (36.5 C)  (!) 97.5 F  (36.4 C)  TempSrc: Oral Oral  Oral  SpO2: 100% 96% 96% 99%  Weight:      Height:        Intake/Output Summary (Last 24 hours) at 05/05/2019 0931 Last data filed at 05/05/2019 [id number] Gross per 24 hour  Intake 611.99 ml  Output 601 ml  Net 10.99 ml   Filed Weights   05/01/19 0455 05/02/19 0500 05/04/19 0742  Weight: 88 kg 88.1 kg 88.1 kg    Examination: General exam: Appears comfortable  HEENT: PERRLA, oral mucosa moist, no sclera icterus or thrush Respiratory system: Clear to auscultation. Respiratory effort normal. Cardiovascular system: S1 & S2 heard,  No murmurs  Gastrointestinal system: Abdomen soft, non-tender, nondistended. Normal bowel sounds   Central nervous system: Alert and oriented. No focal neurological deficits. Extremities: No cyanosis, clubbing or edema- right BKA Skin: No rashes or  ulcers Psychiatry:  Mood & affect appropriate.    Data Reviewed: I have personally reviewed following labs and imaging studies  CBC: Recent Labs  Lab 04/29/19 0322 04/30/19 0740 05/01/19 0341 05/02/19 0610 05/05/19 0218  WBC 10.8* 9.7 10.2 9.5 9.5  NEUTROABS 7.8* 6.7 7.2 6.5  --   HGB 14.8 15.2 14.8 15.0 13.4  HCT 44.8 46.2 45.1 46.0 41.1  MCV 93.9 94.9 94.7 96.4 96.3  PLT 100* 121* 159 180 [id number]   Basic Metabolic Panel: Recent Labs  Lab 04/29/19 0322 04/30/19 0740 05/01/19 0341 05/02/19 0610  NA 137 137 136 136  K 4.2 4.7 4.6 4.8  CL 91* 91* 93* 93*  CO2 33* 33* 32 33*  GLUCOSE 115* 123* 100* 110*  BUN 22 21 21 19   CREATININE 1.05 0.98 0.87 0.89  CALCIUM 8.8* 9.0 8.9 8.9  MG 2.2 2.1 2.2 2.2   GFR: Estimated Creatinine Clearance: 88.3 mL/min (by C-G formula based on SCr of 0.89 mg/dL). Liver Function Tests: Recent Labs  Lab 04/29/19 0322 04/30/19 0740 05/01/19 0341 05/02/19 0610  AST 40 42* 47* 36  ALT 64* 77* 82* 80*  ALKPHOS 72 71 71 68  BILITOT 1.5* 1.6* 1.9* 1.5*  PROT 7.1 7.2 7.2 6.9  ALBUMIN 3.7 3.6 3.8 3.6   No results for input(s):  LIPASE, AMYLASE in the last 168 hours. No results for input(s): AMMONIA in the last 168 hours. Coagulation Profile: Recent Labs  Lab 05/05/19 0218  INR 1.0   Cardiac Enzymes: No results for input(s): CKTOTAL, CKMB, CKMBINDEX, TROPONINI in the last 168 hours. BNP (last 3 results) No results for input(s): PROBNP in the last 8760 hours. HbA1C: Recent Labs    05/03/19 0432  HGBA1C 6.8*   CBG: Recent Labs  Lab 05/04/19 1241 05/04/19 1550 05/04/19 1706 05/04/19 2107 05/05/19 0638  GLUCAP 120* 268* 234* 114* 93   Lipid Profile: Recent Labs    05/03/19 0432  CHOL 109  HDL 46  LDLCALC 54  TRIG 46  CHOLHDL 2.4   Thyroid Function Tests: No results for input(s): TSH, T4TOTAL, FREET4, T3FREE, THYROIDAB in the last 72 hours. Anemia Panel: No results for input(s): VITAMINB12, FOLATE, FERRITIN, TIBC, IRON, RETICCTPCT in the last 72 hours. Urine analysis:    Component Value Date/Time   BILIRUBINUR negative 12/28/2018 1717   PROTEINUR Negative 12/28/2018 1717   UROBILINOGEN 0.2 12/28/2018 1717   NITRITE negative 12/28/2018 1717   LEUKOCYTESUR Negative 12/28/2018 1717   Sepsis Labs: @LABRCNTIP (procalcitonin:4,lacticidven:4) )No results found for this or any previous visit (from the past 240 hour(s)).       Radiology Studies: CT ANGIO HEAD W OR WO CONTRAST  Result Date: 05/03/2019 CLINICAL DATA:  Follow-up stroke.  Coronavirus infection. EXAM: CT ANGIOGRAPHY HEAD AND NECK TECHNIQUE: Multidetector CT imaging of the head and neck was performed using the standard protocol during bolus administration of intravenous contrast. Multiplanar CT image reconstructions and MIPs were obtained to evaluate the vascular anatomy. Carotid stenosis measurements (when applicable) are obtained utilizing NASCET criteria, using the distal internal carotid diameter as the denominator. CONTRAST:  60mL OMNIPAQUE IOHEXOL 350 MG/ML SOLN COMPARISON:  MRI 2 days ago FINDINGS: CT HEAD FINDINGS Brain:  Small acute infarction shown by previous MRI are not visible by CT. The CT examination is within normal limits. No sign of mass, hemorrhage, hydrocephalus or extra-axial collection. Vascular: There is atherosclerotic calcification of the major vessels at the base of the brain. Skull: Negative Sinuses: Clear Orbits: Normal Review of the MIP images  confirms the above findings CTA NECK FINDINGS Aortic arch: Mild atherosclerosis of the aortic arch. No origin stenosis. Branching pattern is normal without origin stenosis. Right carotid system: Common carotid artery widely patent to the bifurcation. No atherosclerotic disease at the bifurcation. Cervical ICA is tortuous but widely patent. Left carotid system: Common carotid artery widely patent to the bifurcation. No bifurcation atherosclerotic disease. Cervical ICA is tortuous but widely patent. Vertebral arteries: Vertebral artery origins are widely patent. Both vertebral arteries appear normal through the cervical region to the foramen magnum. Skeleton: Ordinary cervical spondylosis. Other neck: No mass or lymphadenopathy.  Mild thyroid enlargement. Upper chest: Hazy/patchy bilateral pulmonary infiltrates consistent with viral pneumonia. Review of the MIP images confirms the above findings CTA HEAD FINDINGS Anterior circulation: Both internal carotid arteries are patent through the skull base and siphon regions. No siphon stenosis. The anterior and middle cerebral vessels are patent without proximal stenosis, aneurysm or vascular malformation. No large or medium vessel occlusion. Posterior circulation: Both vertebral arteries are widely patent through the foramen magnum to the basilar. No basilar stenosis. Posterior circulation branch vessels are normal. Venous sinuses: Patent and normal. Anatomic variants: None significant. Review of the MIP images confirms the above findings IMPRESSION: No significant atherosclerotic disease. No stenosis. No intracranial large or  medium vessel occlusion. Bilateral infarctions shown by MRI could be due to micro embolic disease from the heart or could be due to hypercoagulable state with small-vessel infarctions. Electronically Signed   By: Nelson Chimes M.D.   On: 05/03/2019 18:22   CT ANGIO NECK W OR WO CONTRAST  Result Date: 05/03/2019 CLINICAL DATA:  Follow-up stroke.  Coronavirus infection. EXAM: CT ANGIOGRAPHY HEAD AND NECK TECHNIQUE: Multidetector CT imaging of the head and neck was performed using the standard protocol during bolus administration of intravenous contrast. Multiplanar CT image reconstructions and MIPs were obtained to evaluate the vascular anatomy. Carotid stenosis measurements (when applicable) are obtained utilizing NASCET criteria, using the distal internal carotid diameter as the denominator. CONTRAST:  51mL OMNIPAQUE IOHEXOL 350 MG/ML SOLN COMPARISON:  MRI 2 days ago FINDINGS: CT HEAD FINDINGS Brain: Small acute infarction shown by previous MRI are not visible by CT. The CT examination is within normal limits. No sign of mass, hemorrhage, hydrocephalus or extra-axial collection. Vascular: There is atherosclerotic calcification of the major vessels at the base of the brain. Skull: Negative Sinuses: Clear Orbits: Normal Review of the MIP images confirms the above findings CTA NECK FINDINGS Aortic arch: Mild atherosclerosis of the aortic arch. No origin stenosis. Branching pattern is normal without origin stenosis. Right carotid system: Common carotid artery widely patent to the bifurcation. No atherosclerotic disease at the bifurcation. Cervical ICA is tortuous but widely patent. Left carotid system: Common carotid artery widely patent to the bifurcation. No bifurcation atherosclerotic disease. Cervical ICA is tortuous but widely patent. Vertebral arteries: Vertebral artery origins are widely patent. Both vertebral arteries appear normal through the cervical region to the foramen magnum. Skeleton: Ordinary  cervical spondylosis. Other neck: No mass or lymphadenopathy.  Mild thyroid enlargement. Upper chest: Hazy/patchy bilateral pulmonary infiltrates consistent with viral pneumonia. Review of the MIP images confirms the above findings CTA HEAD FINDINGS Anterior circulation: Both internal carotid arteries are patent through the skull base and siphon regions. No siphon stenosis. The anterior and middle cerebral vessels are patent without proximal stenosis, aneurysm or vascular malformation. No large or medium vessel occlusion. Posterior circulation: Both vertebral arteries are widely patent through the foramen magnum to the basilar. No  basilar stenosis. Posterior circulation branch vessels are normal. Venous sinuses: Patent and normal. Anatomic variants: None significant. Review of the MIP images confirms the above findings IMPRESSION: No significant atherosclerotic disease. No stenosis. No intracranial large or medium vessel occlusion. Bilateral infarctions shown by MRI could be due to micro embolic disease from the heart or could be due to hypercoagulable state with small-vessel infarctions. Electronically Signed   By: Nelson Chimes M.D.   On: 05/03/2019 18:22   VAS [place] TRANSCRANIAL DOPPLER W BUBBLES  Result Date: 05/04/2019  Transcranial Doppler with Bubble Indications: Stroke. Comparison Study: no prior Performing Technologist: Abram Sander RVS  Examination Guidelines: A complete evaluation includes B-mode imaging, spectral Doppler, color Doppler, and power Doppler as needed of all accessible portions of each vessel. Bilateral testing is considered an integral part of a complete examination. Limited examinations for reoccurring indications may be performed as noted.  Summary:  A vascular evaluation was performed. The right middle cerebral artery was studied. An IV was inserted into the patient's right forearm . Verbal informed consent was obtained.  No HITS heard at rest. No HITS heard during valsalva. Negative  TCD Bubble study *See table(s) above for measurements and observations.  Diagnosing physician: Antony Contras MD Electronically signed by Antony Contras MD on 05/04/2019 at 1:08:48 PM.    Final    VAS US CAROTID (at [facility] [place] only)  Result Date: 05/03/2019 Carotid Arterial Duplex Study Indications:       CVA, Weakness and Covid positive, positive for DVT. Risk Factors:      Hypertension, hyperlipidemia. Limitations        Today's exam was limited due to the high bifurcation of the                    carotid. Comparison Study:  No prior study on file for comparison Performing Technologist: Sharion Dove RVS  Examination Guidelines: A complete evaluation includes B-mode imaging, spectral Doppler, color Doppler, and power Doppler as needed of all accessible portions of each vessel. Bilateral testing is considered an integral part of a complete examination. Limited examinations for reoccurring indications may be performed as noted.  Right Carotid Findings: +----------+--------+--------+--------+------------------+--------+           PSV cm/sEDV cm/sStenosisPlaque DescriptionComments +----------+--------+--------+--------+------------------+--------+ CCA Prox  132     18              homogeneous                +----------+--------+--------+--------+------------------+--------+ CCA Distal116     15              homogeneous                +----------+--------+--------+--------+------------------+--------+ ICA Prox  72      16              homogeneous                +----------+--------+--------+--------+------------------+--------+ ICA Distal89                                                 +----------+--------+--------+--------+------------------+--------+ ECA       134     18                                         +----------+--------+--------+--------+------------------+--------+ +---------+--------+--+--------+--+  VertebralPSV cm/s71EDV cm/s18  +---------+--------+--+--------+--+  Left Carotid Findings: +----------+--------+--------+--------+------------------+------------------+           PSV cm/sEDV cm/sStenosisPlaque DescriptionComments           +----------+--------+--------+--------+------------------+------------------+ CCA Prox  157     25                                intimal thickening +----------+--------+--------+--------+------------------+------------------+ CCA Distal126     24                                intimal thickening +----------+--------+--------+--------+------------------+------------------+ ICA Prox  63      9               homogeneous                          +----------+--------+--------+--------+------------------+------------------+ ICA Distal114     37                                                   +----------+--------+--------+--------+------------------+------------------+ ECA       135     14                                                   +----------+--------+--------+--------+------------------+------------------+ +----------+--------+--------+--------+-------------------+           PSV cm/sEDV cm/sDescribeArm Pressure (mmHG) +----------+--------+--------+--------+-------------------+ [id number]                                         +----------+--------+--------+--------+-------------------+ +---------+--------+--+--------+-+ VertebralPSV cm/s53EDV cm/s7 +---------+--------+--+--------+-+  Summary: Right Carotid: The extracranial vessels were near-normal with only minimal wall                thickening or plaque. Left Carotid: The extracranial vessels were near-normal with only minimal wall               thickening or plaque. Vertebrals:  Bilateral vertebral arteries demonstrate antegrade flow. Subclavians: Normal flow hemodynamics were seen in bilateral subclavian              arteries. *See table(s) above for measurements and observations.   Electronically signed by Antony Contras MD on 05/03/2019 at 1:13:23 PM.    Final    VAS [place] LOWER EXTREMITY VENOUS (DVT)  Result Date: 05/03/2019  Lower Venous Study Indications: Stroke, and Covid positive, recent history of left posterior tibial DVT found 04/10/19.  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 Comparison Study: Prior study on file for comparison from 04/10/19 Performing Technologist: Sharion Dove RVS  Examination Guidelines: A complete evaluation includes B-mode imaging, spectral Doppler, color Doppler, and power Doppler as needed of all accessible portions of each vessel. Bilateral testing is considered an integral part of a complete examination. Limited examinations for reoccurring indications may be performed as noted.  +---------+---------------+---------+-----------+----------+-------------------+ RIGHT    CompressibilityPhasicitySpontaneityPropertiesThrombus Aging      +---------+---------------+---------+-----------+----------+-------------------+ CFV      Full  Yes      Yes                                      +---------+---------------+---------+-----------+----------+-------------------+ SFJ      Full                                                             +---------+---------------+---------+-----------+----------+-------------------+ FV Prox  Full                                                             +---------+---------------+---------+-----------+----------+-------------------+ FV Mid   Full                                                             +---------+---------------+---------+-----------+----------+-------------------+ FV DistalFull                                                             +---------+---------------+---------+-----------+----------+-------------------+ PFV      Full                                                              +---------+---------------+---------+-----------+----------+-------------------+ POP                                                   patent by color and                                                       Doppler             +---------+---------------+---------+-----------+----------+-------------------+   +---------+---------------+---------+-----------+----------+--------------+ LEFT     CompressibilityPhasicitySpontaneityPropertiesThrombus Aging +---------+---------------+---------+-----------+----------+--------------+ CFV      Full           Yes      Yes                                 +---------+---------------+---------+-----------+----------+--------------+ SFJ      Full                                                        +---------+---------------+---------+-----------+----------+--------------+  FV Prox  Full                                                        +---------+---------------+---------+-----------+----------+--------------+ FV Mid   Full                                                        +---------+---------------+---------+-----------+----------+--------------+ FV DistalFull                                                        +---------+---------------+---------+-----------+----------+--------------+ PFV      Full                                                        +---------+---------------+---------+-----------+----------+--------------+ POP      Full           Yes      Yes                                 +---------+---------------+---------+-----------+----------+--------------+ PTV      None                                         Acute          +---------+---------------+---------+-----------+----------+--------------+ PERO     None                                         Acute          +---------+---------------+---------+-----------+----------+--------------+     Summary: Right: There  is no evidence of deep vein thrombosis in the lower extremity. Left: Findings consistent with acute deep vein thrombosis involving the left posterior tibial veins, and left peroneal veins. Findings suggest new clot progression as compared to previous examination. DVT remains in left posterior tibial veins and is now  visualized in the left peroneal veins.  *See table(s) above for measurements and observations. Electronically signed by Monica Martinez MD on 05/03/2019 at 1:50:02 PM.    Final       Scheduled Meds: . aspirin  81 mg Oral Daily  . atorvastatin  40 mg Oral q1800  . chlorpheniramine-HYDROcodone  5 mL Oral Q12H  . famotidine  20 mg Oral BID  . fluticasone  2 spray Each Nare BID  . insulin aspart  0-9 Units Subcutaneous TID WC  . Ipratropium-Albuterol  1 puff Inhalation BID  . predniSONE  10 mg Oral Q breakfast  . sodium chloride flush  10-40 mL Intracatheter Q12H  . tamsulosin  0.4 mg Oral QPC supper  . ursodiol  300  mg Oral BID  . vitamin C  500 mg Oral Daily  . Warfarin - Pharmacist Dosing Inpatient   Does not apply q1800  . zinc sulfate  220 mg Oral Daily   Continuous Infusions: . heparin 1,300 Units/hr (05/05/19 0331)     LOS: 26 days      Debbe Odea, MD [facility] Pager: [email or web address] Password [facility] 05/05/2019, 9:31 AM

## 2019-05-05 NOTE — Plan of Care (Signed)
  Problem: Respiratory: Goal: Will maintain a patent airway Outcome: Progressing   Problem: Coping: Goal: Psychosocial and spiritual needs will be supported Outcome: Progressing   Problem: Education: Goal: Knowledge of risk factors and measures for prevention of condition will improve Outcome: Progressing   Problem: Education: Goal: Knowledge of General Education information will improve Description: Including pain rating scale, medication(s)/side effects and non-pharmacologic comfort measures Outcome: Progressing   Problem: Health Behavior/Discharge Planning: Goal: Ability to manage health-related needs will improve Outcome: Progressing

## 2019-05-06 LAB — APTT
aPTT: 90 seconds — ABNORMAL HIGH (ref 24–36)
aPTT: 124 seconds — ABNORMAL HIGH (ref 24–36)
aPTT: 148 seconds — ABNORMAL HIGH (ref 24–36)
aPTT: 126 seconds — ABNORMAL HIGH (ref 24–36)

## 2019-05-06 LAB — CBC
HCT: 40.9 % (ref 39.0–52.0)
Hemoglobin: 13.7 g/dL (ref 13.0–17.0)
MCH: 32 pg (ref 26.0–34.0)
MCHC: 33.5 g/dL (ref 30.0–36.0)
MCV: 95.6 fL (ref 80.0–100.0)
Platelets: 251 10*3/uL (ref 150–400)
RBC: 4.28 MIL/uL (ref 4.22–5.81)
RDW: 14.3 % (ref 11.5–15.5)
WBC: 9 10*3/uL (ref 4.0–10.5)
nRBC: 0 % (ref 0.0–0.2)

## 2019-05-06 LAB — GLUCOSE, CAPILLARY
Glucose-Capillary: 110 mg/dL — ABNORMAL HIGH (ref 70–99)
Glucose-Capillary: 254 mg/dL — ABNORMAL HIGH (ref 70–99)
Glucose-Capillary: 132 mg/dL — ABNORMAL HIGH (ref 70–99)
Glucose-Capillary: 127 mg/dL — ABNORMAL HIGH (ref 70–99)

## 2019-05-06 LAB — HEPARIN LEVEL (UNFRACTIONATED)
Heparin Unfractionated: 0.84 IU/mL — ABNORMAL HIGH (ref 0.30–0.70)
Heparin Unfractionated: 0.96 IU/mL — ABNORMAL HIGH (ref 0.30–0.70)
Heparin Unfractionated: 0.55 IU/mL (ref 0.30–0.70)

## 2019-05-06 LAB — BETA-2-GLYCOPROTEIN I ABS, IGG/M/A
Beta-2 Glyco I IgG: 9 GPI IgG units (ref 0–20)
Beta-2-Glycoprotein I IgA: 13 GPI IgA units (ref 0–25)
Beta-2-Glycoprotein I IgM: <9 GPI IgM units (ref 0–32)

## 2019-05-06 LAB — PROTIME-INR
INR: 1.3 — ABNORMAL HIGH (ref 0.8–1.2)
Prothrombin Time: 15.9 seconds — ABNORMAL HIGH (ref 11.4–15.2)

## 2019-05-06 MED ORDER — WARFARIN SODIUM 7.5 MG PO TABS
7.5000 mg | ORAL_TABLET | Freq: Once | ORAL | Status: AC
  Administered 2019-05-06: 7.5 mg via ORAL
  Filled 2019-05-06: qty 1

## 2019-05-06 NOTE — Progress Notes (Signed)
PROGRESS NOTE    [name]   [id number]  DOB: 06-18-1955  DOA: 04/08/2019 PCP: [name], FNP   Brief Narrative:  [name] is a 63 year old male with a right BKA, hypertension, hyperlipidemia, diabetes mellitus type 2 who was admitted to [facility] for COVID pneumonia on 11/15. He has been transferred to [facility] as of 12/9 for an acute CVA. His COVID pneumonia was complicated by a DVT, acute PE, thrombocytopenia and superimposed bacterial pneumonia and he has been receiving Xarelto.  His neurological symptoms include right hand weakness. An MRI obtained on 12/8 revealed > Multifocal acute ischemia within the left MCA territory, with largest area at the base of the left central sulcus. No hemorrhage or mass effect. - Single punctate focus of acute ischemia within the anterior right parietal white matter.   Subjective: He has no new complaints. He ambulated in the hall twice yesterday and is feeling better each time.  Assessment & Plan:   Principal Problem:   Pneumonia due to COVID-19 virus/   Acute respiratory failure with hypoxia - This was treated with steroids, tocilizumab, convalescent plasma and antibiotics - he has been weaned down to 3 L O2 and 10 mg of Prednisone - still having pulse ox drops to 70-80s when ambulating - cont to wean Prednisone and O2 as able  Active Problems:  Acute LLE DVT and PE- he remains intermittently tachycardic and tachypneic - Possibly related to hypercoagulable state (due to COVID) - continues to be tachycardic and tachypneic at times - Xarelto transitioned to Heparin/Coumadin    Acute ischemic left MCA stroke  - occurring while on Xarelto & aspirin 81 mg -   2 D ECHO show no thrombus- there is grade 1 dCHF- see report below -  carotid duplex shows no stenosis - LE venous duplex repeated on 12/10> acute deep vein thrombosis involving the left posterior tibial veins, and left peroneal veins. Findings suggest new clot progression as  compared to previous examination. DVT remains in left posterior tibial veins and is now visualized in the left peroneal veins. - LDL 54 (this was 122 in 12/28/18) - A1c 6.8 - Transcranial dopplers with bubble study is negative - stroke team has evaluated him and Dr Leonie Man recommends a heme consult to determine if Xarelto is the appropriate anticoagulant for him- hematology has transitioned him to Coumadin with Heparin bridge- awaiting therapeutic INR which is 1.3  DM2 with hyperglycemia - A1c is 6.8- he has been on SSI while in the hospital- previously diet controlled  AKI - Cr rose to 1.46 on 04/08/19 and has subsequently trended down to 0.89   Gout flare of 1st left MTP - improved with Prednisone  RLE BKA  Essential HTN - Diovan/HCTZ on hold in setting of acute CVA- BP quite stable  BPH - cont Flomax   Time spent in minutes: 35 DVT prophylaxis: Xarelto Code Status: Full code Family Communication:  Disposition Plan: home when INR therapeutic Consultants:   ID and hematology by phone  Neurology  12/11- hematology Procedures:    2 D ECHO 1. Left ventricular ejection fraction, by visual estimation, is 60 to 65%. The left ventricle has normal function. There is no left ventricular hypertrophy.  2. Left ventricular diastolic parameters are consistent with Grade I diastolic dysfunction (impaired relaxation).  3. The left ventricle has no regional wall motion abnormalities.  4. Global right ventricle has normal systolic function.The right ventricular size is normal.  5. Left atrial size was normal.  6.  Right atrial size was normal.  7. The mitral valve is normal in structure. No evidence of mitral valve regurgitation. No evidence of mitral stenosis.  8. The tricuspid valve is normal in structure. Tricuspid valve regurgitation is not demonstrated.  9. The aortic valve is normal in structure. Aortic valve regurgitation is mild. Mild aortic valve sclerosis without stenosis. 10.  The pulmonic valve was normal in structure. Pulmonic valve regurgitation is not visualized. 11. Normal LV systolic function; grade 1 diastolic dysfunction; mildly sclerotic aortic valve with mild AI.  Antimicrobials:  Anti-infectives (From admission, onward)   Start     Dose/Rate Route Frequency Ordered Stop   04/19/19 1130  cefTRIAXone (ROCEPHIN) 2 g in sodium chloride 0.9 % 100 mL IVPB     2 g 200 mL/hr over 30 Minutes Intravenous Every 24 hours 04/19/19 0932 04/26/19 1129   04/18/19 1130  cefTRIAXone (ROCEPHIN) 1 g in sodium chloride 0.9 % 100 mL IVPB  Status:  Discontinued     1 g 200 mL/hr over 30 Minutes Intravenous Every 24 hours 04/18/19 1123 04/19/19 0932   04/10/19 1000  remdesivir 100 mg in sodium chloride 0.9 % 250 mL IVPB     100 mg 500 mL/hr over 30 Minutes Intravenous Every 24 hours 04/09/19 1321 04/13/19 1933   04/09/19 2200  cefTRIAXone (ROCEPHIN) 2 g in sodium chloride 0.9 % 100 mL IVPB  Status:  Discontinued     2 g 200 mL/hr over 30 Minutes Intravenous Every 24 hours 04/09/19 0400 04/10/19 0827   04/09/19 2200  azithromycin (ZITHROMAX) 500 mg in sodium chloride 0.9 % 250 mL IVPB  Status:  Discontinued     500 mg 250 mL/hr over 60 Minutes Intravenous Every 24 hours 04/09/19 0400 04/10/19 0827   04/09/19 1400  remdesivir 200 mg in sodium chloride 0.9 % 250 mL IVPB     200 mg 500 mL/hr over 30 Minutes Intravenous Once 04/09/19 1321 04/09/19 1642   04/09/19 0000  cefTRIAXone (ROCEPHIN) 1 g in sodium chloride 0.9 % 100 mL IVPB     1 g 200 mL/hr over 30 Minutes Intravenous  Once 04/08/19 2353 04/09/19 0115   04/09/19 0000  azithromycin (ZITHROMAX) 500 mg in sodium chloride 0.9 % 250 mL IVPB     500 mg 250 mL/hr over 60 Minutes Intravenous  Once 04/08/19 2353 04/09/19 0132       Objective: Vitals:   05/06/19 0454 05/06/19 0500 05/06/19 0746 05/06/19 0859  BP: 131/90  118/84   Pulse: 92  79   Resp: 18  18   Temp: 97.7 F (36.5 C)  97.9 F (36.6 C)   TempSrc:  Oral  Oral   SpO2: 99%  98% 99%  Weight:  92.1 kg    Height:        Intake/Output Summary (Last 24 hours) at 05/06/2019 1128 Last data filed at 05/06/2019 0748 Gross per 24 hour  Intake --  Output 500 ml  Net -500 ml   Filed Weights   05/02/19 0500 05/04/19 0742 05/06/19 0500  Weight: 88.1 kg 88.1 kg 92.1 kg    Examination: General exam: Appears comfortable  HEENT: PERRLA, oral mucosa moist, no sclera icterus or thrush Respiratory system: Clear to auscultation. Respiratory effort normal. Cardiovascular system: S1 & S2 heard,  No murmurs  Gastrointestinal system: Abdomen soft, non-tender, nondistended. Normal bowel sounds   Central nervous system: Alert and oriented. No focal neurological deficits. Extremities: No cyanosis, clubbing or edema- right BKA Skin: No rashes or  ulcers Psychiatry:  Mood & affect appropriate.    Data Reviewed: I have personally reviewed following labs and imaging studies  CBC: Recent Labs  Lab 04/30/19 0740 05/01/19 0341 05/02/19 0610 05/05/19 0218 05/06/19 0242  WBC 9.7 10.2 9.5 9.5 9.0  NEUTROABS 6.7 7.2 6.5  --   --   HGB 15.2 14.8 15.0 13.4 13.7  HCT 46.2 45.1 46.0 41.1 40.9  MCV 94.9 94.7 96.4 96.3 95.6  PLT 121* 159 180 215 [id number]   Basic Metabolic Panel: Recent Labs  Lab 04/30/19 0740 05/01/19 0341 05/02/19 0610  NA 137 136 136  K 4.7 4.6 4.8  CL 91* 93* 93*  CO2 33* 32 33*  GLUCOSE 123* 100* 110*  BUN 21 21 19   CREATININE 0.98 0.87 0.89  CALCIUM 9.0 8.9 8.9  MG 2.1 2.2 2.2   GFR: Estimated Creatinine Clearance: 90.2 mL/min (by C-G formula based on SCr of 0.89 mg/dL). Liver Function Tests: Recent Labs  Lab 04/30/19 0740 05/01/19 0341 05/02/19 0610  AST 42* 47* 36  ALT 77* 82* 80*  ALKPHOS 71 71 68  BILITOT 1.6* 1.9* 1.5*  PROT 7.2 7.2 6.9  ALBUMIN 3.6 3.8 3.6   No results for input(s): LIPASE, AMYLASE in the last 168 hours. No results for input(s): AMMONIA in the last 168 hours. Coagulation Profile: Recent  Labs  Lab 05/05/19 0218 05/06/19 0242  INR 1.0 1.3*   Cardiac Enzymes: No results for input(s): CKTOTAL, CKMB, CKMBINDEX, TROPONINI in the last 168 hours. BNP (last 3 results) No results for input(s): PROBNP in the last 8760 hours. HbA1C: No results for input(s): HGBA1C in the last 72 hours. CBG: Recent Labs  Lab 05/05/19 0638 05/05/19 1202 05/05/19 1618 05/05/19 2140 05/06/19 0614  GLUCAP 93 178* 148* 130* 110*   Lipid Profile: No results for input(s): CHOL, HDL, LDLCALC, TRIG, CHOLHDL, LDLDIRECT in the last 72 hours. Thyroid Function Tests: No results for input(s): TSH, T4TOTAL, FREET4, T3FREE, THYROIDAB in the last 72 hours. Anemia Panel: No results for input(s): VITAMINB12, FOLATE, FERRITIN, TIBC, IRON, RETICCTPCT in the last 72 hours. Urine analysis:    Component Value Date/Time   BILIRUBINUR negative 12/28/2018 1717   PROTEINUR Negative 12/28/2018 1717   UROBILINOGEN 0.2 12/28/2018 1717   NITRITE negative 12/28/2018 1717   LEUKOCYTESUR Negative 12/28/2018 1717   Sepsis Labs: @LABRCNTIP (procalcitonin:4,lacticidven:4) )No results found for this or any previous visit (from the past 240 hour(s)).       Radiology Studies: No results found.    Scheduled Meds: . aspirin  81 mg Oral Daily  . atorvastatin  40 mg Oral q1800  . chlorpheniramine-HYDROcodone  5 mL Oral Q12H  . famotidine  20 mg Oral BID  . fluticasone  2 spray Each Nare BID  . insulin aspart  0-9 Units Subcutaneous TID WC  . Ipratropium-Albuterol  1 puff Inhalation BID  . predniSONE  10 mg Oral Q breakfast  . sodium chloride flush  10-40 mL Intracatheter Q12H  . tamsulosin  0.4 mg Oral QPC supper  . ursodiol  300 mg Oral BID  . vitamin C  500 mg Oral Daily  . Warfarin - Pharmacist Dosing Inpatient   Does not apply q1800  . zinc sulfate  220 mg Oral Daily   Continuous Infusions: . heparin 1,500 Units/hr (05/06/19 0913)     LOS: 27 days      Debbe Odea, MD [facility] Pager: [email or web address] Password [email or web address] 05/06/2019, 11:28 AM

## 2019-05-06 NOTE — Progress Notes (Signed)
ANTICOAGULATION CONSULT NOTE  Pharmacy Consult for Heparin and Warfarin Indication: DVT, PE, and CVA  No Known Allergies  Patient Measurements: Height: 5\' 6"  (167.6 cm) Weight: 203 lb 0.7 oz (92.1 kg) IBW/kg (Calculated) : 63.8 Heparin Dosing Weight: 82.3 kg  Vital Signs: Temp: 97.8 F (36.6 C) (12/13 1200) Temp Source: Oral (12/13 1200) BP: 126/74 (12/13 1200) Pulse Rate: 100 (12/13 1200)  Labs: Recent Labs    05/05/19 0218 05/05/19 1711 05/06/19 0242 05/06/19 1029 05/06/19 1251  HGB 13.4  --  13.7  --   --   HCT 41.1  --  40.9  --   --   PLT 215  --  251  --   --   APTT 52* 59* 90* 124*  --   LABPROT 13.3  --  15.9*  --   --   INR 1.0  --  1.3*  --   --   HEPARINUNFRC 0.70  --   --  0.84* 0.96*    Estimated Creatinine Clearance: 90.2 mL/min (by C-G formula based on SCr of 0.89 mg/dL).   Assessment: 74 YOM to continue on IV heparin for recent PE/DVT and CVA.  We have been using aPTT to guide heparin dosing since Xarelto could falsely elevate heparin levels, but this effect is likely minimal at this point. Patients 1100am HL and aPTT both came back elevated despite decreasing the rate after the last level. The patient currently has one peripheral IV, I spoke with the phlebotomist who could not confirm who drew the level for the patient. Whoever drew it could have drawn it too close to where the heparin was infusing and caused it to be falsely elevated. I reordered the levels and the phlebotomist I spoke with said he would redraw it. I got a call back saying the patient reports having all his blood drawn out of his opposite arm so this is likely a true increase. The patients repeat heparin level was 0.96 and aPTT was 148. Since his level continues to rise I will hold his heparin for 1 hour and then decrease the patients heparin rate and recheck both aPTT and HL one more time in 6 hours from the restart. His INR is 1.3 today so I will redose his warfarin tonight with 7.5mg .     Goal of Therapy:  INR 2-3 Monitor platelets by anticoagulation protocol: Yes  APTT 66-85 seconds HL 0.3-0.5  Plan:  Warfarin 7.5mg  tonight  Decrease heparin gtt to 1300 units/hr Check 6 hr aPTT/HL. Daily INR  Thanks for allowing pharmacy to be a part of this patient's care.  Nicoletta Dress, PharmD PGY2 Infectious Disease Pharmacy Resident   05/06/2019, 1:33 PM

## 2019-05-06 NOTE — Progress Notes (Signed)
ANTICOAGULATION CONSULT NOTE  Pharmacy Consult for Heparin  Indication: DVT, PE, and CVA  No Known Allergies  Patient Measurements: Height: 5\' 6"  (167.6 cm) Weight: 194 lb 3.6 oz (88.1 kg) IBW/kg (Calculated) : 63.8 Heparin Dosing Weight: 82.3 kg  Vital Signs: Temp: 97.9 F (36.6 C) (12/12 2300) Temp Source: Oral (12/12 2300) BP: 112/76 (12/12 2300) Pulse Rate: 74 (12/12 2300)  Labs: Recent Labs    05/04/19 0835 05/05/19 0218 05/05/19 0952 05/05/19 1711 05/06/19 0242  HGB  --  13.4  --   --  13.7  HCT  --  41.1  --   --  40.9  PLT  --  215  --   --  251  APTT 33 52* 59* 59* 90*  LABPROT  --  13.3  --   --  15.9*  INR  --  1.0  --   --  1.3*  HEPARINUNFRC >2.20* 0.70  --   --   --     Estimated Creatinine Clearance: 88.3 mL/min (by C-G formula based on SCr of 0.89 mg/dL).   Assessment: 29 YOM to continue on IV heparin for recent PE/DVT and CVA.  Currently using aPTT to guide heparin dosing since Xarelto could falsely elevate heparin levels.  APTT is supratherapeutic 90 sec  Goal of Therapy:  INR 2-3 Monitor platelets by anticoagulation protocol: Yes  APTT 66-85 seconds   Plan:  Decrease heparin gtt to 1500 units/hr Check 6 hr aPTT/HL.  Thanks for allowing pharmacy to be a part of this patient's care.  Excell Seltzer, PharmD Clinical Pharmacist  05/06/2019, 4:37 AM

## 2019-05-06 NOTE — Progress Notes (Addendum)
ANTICOAGULATION CONSULT NOTE - Follow Up Consult  Pharmacy Consult for heparin Indication: PE/DVT in setting of CVA  Labs: Recent Labs    05/05/19 0218 05/05/19 0218 05/06/19 0242 05/06/19 1029 05/06/19 1251 05/06/19 2216  HGB 13.4  --  13.7  --   --   --   HCT 41.1  --  40.9  --   --   --   PLT 215  --  251  --   --   --   APTT 52*   < > 90* 124* 148* 126*  LABPROT 13.3  --  15.9*  --   --   --   INR 1.0  --  1.3*  --   --   --   HEPARINUNFRC 0.70  --   --  0.84* 0.96* 0.55   < > = values in this interval not displayed.    Assessment: 63yo male remains slightly supratherapeutic on heparin though PTT and heparin level are now mismatched; no gtt issues or signs of bleeding per RN.  Goal of Therapy:  Heparin level 0.3-0.5 units/ml   Plan:  Will decrease heparin gtt by ~1 unit/kg/hr to 1200 units/hr and check heparin level with am labs.    Wynona Neat, PharmD, BCPS  05/06/2019,11:37 PM   Addendum: AM heparin level higher (0.62) despite decreased rate.  Will further decrease heparin gtt to 1000 units/hr and check level in 6hr.  VB 05/07/2019 5:03 AM

## 2019-05-07 LAB — CBC
HCT: 40.2 % (ref 39.0–52.0)
Hemoglobin: 12.9 g/dL — ABNORMAL LOW (ref 13.0–17.0)
MCH: 31.1 pg (ref 26.0–34.0)
MCHC: 32.1 g/dL (ref 30.0–36.0)
MCV: 96.9 fL (ref 80.0–100.0)
Platelets: 241 10*3/uL (ref 150–400)
RBC: 4.15 MIL/uL — ABNORMAL LOW (ref 4.22–5.81)
RDW: 14.3 % (ref 11.5–15.5)
WBC: 8 10*3/uL (ref 4.0–10.5)
nRBC: 0 % (ref 0.0–0.2)

## 2019-05-07 LAB — LUPUS ANTICOAGULANT PANEL
DRVVT: 80.8 s — ABNORMAL HIGH (ref 0.0–47.0)
PTT Lupus Anticoagulant: 41.9 s (ref 0.0–51.9)

## 2019-05-07 LAB — DRVVT MIX: dRVVT Mix: 52.9 s — ABNORMAL HIGH (ref 0.0–47.0)

## 2019-05-07 LAB — GLUCOSE, CAPILLARY
Glucose-Capillary: 95 mg/dL (ref 70–99)
Glucose-Capillary: 138 mg/dL — ABNORMAL HIGH (ref 70–99)
Glucose-Capillary: 137 mg/dL — ABNORMAL HIGH (ref 70–99)

## 2019-05-07 LAB — DRVVT CONFIRM: dRVVT Confirm: 1.7 ratio — ABNORMAL HIGH (ref 0.8–1.2)

## 2019-05-07 LAB — PROTIME-INR
INR: 2 — ABNORMAL HIGH (ref 0.8–1.2)
Prothrombin Time: 23 seconds — ABNORMAL HIGH (ref 11.4–15.2)

## 2019-05-07 LAB — HEPARIN LEVEL (UNFRACTIONATED): Heparin Unfractionated: 0.62 IU/mL (ref 0.30–0.70)

## 2019-05-07 MED ORDER — GUAIFENESIN-DM 100-10 MG/5ML PO SYRP: 10.0000 mL | ORAL_SOLUTION | ORAL | 0 refills | Status: DC | PRN

## 2019-05-07 MED ORDER — POLYETHYLENE GLYCOL 3350 17 G PO PACK: 17.0000 g | PACK | Freq: Every day | ORAL | 0 refills | Status: DC | PRN

## 2019-05-07 MED ORDER — PREDNISONE 10 MG PO TABS: 5.0000 mg | ORAL_TABLET | Freq: Every day | ORAL | 0 refills | Status: DC

## 2019-05-07 MED ORDER — TAMSULOSIN HCL 0.4 MG PO CAPS: 0.4000 mg | ORAL_CAPSULE | Freq: Every day | ORAL | 0 refills | Status: DC

## 2019-05-07 MED ORDER — ATORVASTATIN CALCIUM 40 MG PO TABS: 40.0000 mg | ORAL_TABLET | Freq: Every day | ORAL | 0 refills | Status: DC

## 2019-05-07 MED ORDER — HYDROCOD POLST-CPM POLST ER 10-8 MG/5ML PO SUER: 5.0000 mL | Freq: Two times a day (BID) | ORAL | 0 refills | Status: DC | PRN

## 2019-05-07 MED ORDER — WARFARIN SODIUM 2 MG PO TABS: 4.0000 mg | ORAL_TABLET | Freq: Every day | ORAL | 2 refills | Status: DC

## 2019-05-07 MED ORDER — WARFARIN SODIUM 5 MG PO TABS
5.0000 mg | ORAL_TABLET | Freq: Once | ORAL | Status: AC
  Administered 2019-05-07: 5 mg via ORAL
  Filled 2019-05-07: qty 1

## 2019-05-07 MED FILL — HYDROCODONE-CHLORPHEN ER SU: 10-8 | 10 days supply | Qty: 100 | Fill #0

## 2019-05-07 MED FILL — TAMSULOSIN HCL 0.4 MG CAP: 0.4 | 30 days supply | Qty: 30 | Fill #0

## 2019-05-07 MED FILL — POLYETHYLENE GLYCOL 3350 PO: 17 | 14 days supply | Qty: 238 | Fill #0

## 2019-05-07 MED FILL — WARFARIN SODIUM 2 MG TABLET: 2 | 15 days supply | Qty: 30 | Fill #0

## 2019-05-07 MED FILL — ATORVASTATIN CALCIUM 40 MG: 40 | 30 days supply | Qty: 30 | Fill #0

## 2019-05-07 MED FILL — predniSONE 10 MG TABS: 10 | 6 days supply | Qty: 4 | Fill #0

## 2019-05-07 MED FILL — SM TUSSIN DM SYRUP: 100-10 | 4 days supply | Qty: 236 | Fill #0

## 2019-05-07 NOTE — Progress Notes (Signed)
Patient's wife called and confirmed that all equipment was delivered to the house at this time. PTAR called and transportation has been set up for the patient to go home. Oncoming nurse is aware.

## 2019-05-07 NOTE — Progress Notes (Signed)
Called patient's wife to check the status of home equipment being delivered to the home. No answer, voicemail left with nurses callback number. Will try to contact again at [phone].

## 2019-05-07 NOTE — TOC Transition Note (Signed)
Transition of Care [facility]) - CM/SW Discharge Note   Patient Details  Name: [name] MRN: [id number] Date of Birth: Oct 13, 1955  [facility]) CM/SW Contact:  Pollie Friar, RN Phone Number: 05/07/2019, 1:29 PM   Clinical Narrative:    Pt discharging home today with [facility] services through [place]. Cory with [facility] has accepted the referral.  Pt to have oxygen, wheelchair, walker, 3 in 1 delivered to the home per [employer].  Pts wife has also had covid and recently d/c ed from rehab. She is going to stay with her sister for a few more days and pts son and brother are going to stay with [name].  Pt will require PTAR home when oxygen has been delivered to the home.    Final next level of care: Home w Home Health Services Barriers to Discharge: No Barriers Identified   Patient Goals and CMS Choice   CMS Medicare.gov Compare Post Acute Care list provided to:: Patient Choice offered to / list presented to : Patient  Discharge Placement                       Discharge Plan and Services                DME Arranged: 3-N-1, Wheelchair manual, Walker rolling, Oxygen DME Agency: [employer] Date DME Agency Contacted: 05/07/19   Representative spoke with at DME Agency: [place]: PT, OT, Nurse's Aide, Social Work, Engineer, materials Agency: [place] Date [place]: 05/07/19   Representative spoke with at [place] Interventions     Readmission Risk Interventions No flowsheet data found.

## 2019-05-07 NOTE — Discharge Summary (Signed)
Physician Discharge Summary  [name] [id number] DOB: Nov 20, 1955 DOA: 04/08/2019  PCP: [name], FNP  Admit date: 04/08/2019 Discharge date: 05/07/2019  Admitted From: home Disposition:  home   Recommendations for Outpatient Follow-up:  1. Please f/u INR on Thursday and adjust Coumadin as needed  Home Health:  ordered    Discharge Condition:  stable   CODE STATUS:  Full code   Diet recommendation:  Heart healthy, diabetic    Discharge Diagnoses:  Principal Problem:   Pneumonia due to COVID-19 virus Active Problems:   Acute respiratory failure with hypoxia ([place])   AKI (acute kidney injury) ([place])   Bilateral pulmonary embolism ([facility])   Lobar pneumonia ([place])   Acute ischemic left MCA stroke ([place])   Essential hypertension   HLD (hyperlipidemia)   H/o right BKA  Brief Summary: [name] is a 63 year old male with a right BKA, hypertension, hyperlipidemia, diabetes mellitus type 2 who was admitted to [facility] for COVID pneumonia on 11/15. He has been transferred to [facility] as of 12/9 for an acute CVA. His COVID pneumonia was complicated by a DVT, acute PE, thrombocytopenia and superimposed bacterial pneumonia and he has been receiving Xarelto.  His neurological symptoms include right hand weakness. An MRI obtained on 12/8 revealed > Multifocal acute ischemia within the left MCA territory, with largest area at the base of the left central sulcus. No hemorrhage or mass effect- Single punctate focus of acute ischemia within the anterior right parietal white matter.  Hospital Course:  Principal Problem:   Pneumonia due to COVID-19 virus/   Acute respiratory failure with hypoxia - This was treated with steroids, tocilizumab, convalescent plasma and antibiotics - he has been weaned down to 3 L O2 and 10 mg of Prednisone - still having pulse ox drops to 70-80s when ambulating - he will be discharged on home O2 - he is down to 10 mg on his Prednisone taper and will  complete it over the next few days  Active Problems:  Acute LLE DVT and PE- he remains intermittently tachycardic and tachypneic - Possibly related to hypercoagulable state (due to COVID) - continues to be mildly tachycardic and tachypneic at times (mainly with exertion) - Xarelto transitioned to Heparin/Coumadin    Acute ischemic left MCA stroke  - occurring while on Xarelto & aspirin 81 mg -   2 D ECHO show no thrombus- there is grade 1 dCHF- see report below -  carotid duplex shows no stenosis - LE venous duplex repeated on 12/10> acute deep vein thrombosis involving the left posterior tibial veins, and left peroneal veins. Findings suggest new clot progression as compared to previous examination. DVT remains in left posterior tibial veins and is now visualized in the left peroneal veins. - LDL 54 (this was 122 in 12/28/18) - A1c 6.8 - Transcranial dopplers with bubble study is negative - stroke team has evaluated him and Dr Leonie Man recommends a heme consult to determine if Xarelto is the appropriate anticoagulant for him- hematology has transitioned him to Coumadin with Heparin bridge- INR is therapeutic today- he is to continue ASA 81 mg as well and follow up with both neuro and hematology as outpt  DM2 with hyperglycemia - A1c is 6.8- he has been on SSI while in the hospital-  diet controlled as outpt  AKI - Cr rose to 1.46 on 04/08/19 and has subsequently trended down to 0.89   Gout flare of 1st left MTP - improved with Prednisone  RLE BKA  Essential HTN - Diovan/HCTZ on hold in setting of acute CVA- BP has NOT been elevated  BPH - cont Flomax   Consultants:   ID and hematology by phone  Neurology  12/11- hematology Procedures:    2 D ECHO 1. Left ventricular ejection fraction, by visual estimation, is 60 to 65%. The left ventricle has normal function. There is no left ventricular hypertrophy. 2. Left ventricular diastolic parameters are consistent with  Grade I diastolic dysfunction (impaired relaxation). 3. The left ventricle has no regional wall motion abnormalities. 4. Global right ventricle has normal systolic function.The right ventricular size is normal. 5. Left atrial size was normal. 6. Right atrial size was normal. 7. The mitral valve is normal in structure. No evidence of mitral valve regurgitation. No evidence of mitral stenosis. 8. The tricuspid valve is normal in structure. Tricuspid valve regurgitation is not demonstrated. 9. The aortic valve is normal in structure. Aortic valve regurgitation is mild. Mild aortic valve sclerosis without stenosis. 10. The pulmonic valve was normal in structure. Pulmonic valve regurgitation is not visualized. 11. Normal LV systolic function; grade 1 diastolic dysfunction; mildly sclerotic aortic valve with mild AI.   Discharge Exam: Vitals:   05/07/19 0726 05/07/19 0846  BP:  125/83  Pulse:  97  Resp:  18  Temp:  97.7 F (36.5 C)  SpO2: 100% 95%   Vitals:   05/06/19 2339 05/07/19 0420 05/07/19 0726 05/07/19 0846  BP: 130/84 128/85  125/83  Pulse: 73 69  97  Resp: 15 17  18   Temp: 97.7 F (36.5 C) 97.8 F (36.6 C)  97.7 F (36.5 C)  TempSrc: Oral Oral  Axillary  SpO2: 100%  100% 95%  Weight:      Height:        General: Pt is alert, awake, not in acute distress Cardiovascular: RRR, S1/S2 +, no rubs, no gallops Respiratory: CTA bilaterally, no wheezing, no rhonchi Abdominal: Soft, NT, ND, bowel sounds + Extremities: no edema, no cyanosis- right BKA noted   Discharge Instructions  Discharge Instructions    Diet - low sodium heart healthy   Complete by: As directed    Increase activity slowly   Complete by: As directed    MyChart COVID-19 home monitoring program   Complete by: May 07, 2019    Is the patient willing to use the MyChart Mobile App for home monitoring?: Yes   Temperature monitoring   Complete by: May 07, 2019    After how many days would you like  to receive a notification of this patient's flowsheet entries?: 1     Allergies as of 05/07/2019   No Known Allergies     Medication List    STOP taking these medications   doxycycline 100 MG tablet Commonly known as: VIBRA-TABS   valsartan-hydrochlorothiazide 320-25 MG tablet Commonly known as: DIOVAN-HCT     TAKE these medications   acetaminophen 500 MG tablet Commonly known as: TYLENOL Take 500 mg by mouth every 6 (six) hours as needed for mild pain or fever.   aspirin 81 MG chewable tablet Chew 81 mg by mouth daily.   atorvastatin 40 MG tablet Commonly known as: LIPITOR Take 1 tablet (40 mg total) by mouth daily at 6 PM. What changed:   medication strength  when to take this   chlorpheniramine-HYDROcodone 10-8 MG/5ML Suer Commonly known as: TUSSIONEX Take 5 mLs by mouth every 12 (twelve) hours as needed for cough.   famotidine 20 MG tablet Commonly known as:  PEPCID Take 20 mg by mouth 2 (two) times daily.   guaiFENesin-dextromethorphan 100-10 MG/5ML syrup Commonly known as: ROBITUSSIN DM Take 10 mLs by mouth every 4 (four) hours as needed for cough.   polyethylene glycol 17 g packet Commonly known as: MIRALAX / GLYCOLAX Take 17 g by mouth daily as needed for mild constipation.   predniSONE 10 MG tablet Commonly known as: DELTASONE Take 0.5 tablets (5 mg total) by mouth daily with breakfast. Take 10 mg for 2 more days and then 5 mg for 4 days   tamsulosin 0.4 MG Caps capsule Commonly known as: FLOMAX Take 1 capsule (0.4 mg total) by mouth daily after supper.   ursodiol 250 MG tablet Commonly known as: ACTIGALL Take 250 mg by mouth 2 (two) times daily.   warfarin 2 MG tablet Commonly known as: Coumadin Take 2 tablets (4 mg total) by mouth daily. Take 4 mg daily- dose to be adjusted after INR check            Durable Medical Equipment  (From admission, onward)         Start     Ordered   05/07/19 0859  For home use only DME wheelchair  cushion (seat and back)  Once     05/07/19 0858   05/07/19 0857  For home use only DME lightweight manual wheelchair with seat cushion  Once    Comments: Patient suffers from right BKA which impairs their ability to perform daily activities like dressing in the home.  A walker will not resolve  issue with performing activities of daily living. A wheelchair will allow patient to safely perform daily activities. Patient is not able to propel themselves in the home using a standard weight wheelchair due to weakness. Patient can self propel in the lightweight wheelchair. Length of need 6 months. Accessories: elevating leg rests (ELRs), wheel locks, extensions and anti-tippers.   05/07/19 0857   05/04/19 0951  For home use only DME standard manual wheelchair with seat cushion  Once    Comments: Patient suffers from right BKA, COVID 19 pneumonia and an acute CVA which impairs their ability to perform daily activities like dressing in the home.  A walker will not resolve issue with performing activities of daily living. A wheelchair will allow patient to safely perform daily activities. Patient can safely propel the wheelchair in the home or has a caregiver who can provide assistance. Length of need 6 months . Accessories: elevating leg rests (ELRs), wheel locks, extensions and anti-tippers.   05/04/19 [id number]         Follow-up Information    Garvin Fila, MD Follow up.   Specialties: Neurology, Radiology Why: 4-6 wks Contact information: [address] [place] [address] [phone]        [facility] Follow up.   Specialty: Pulmonology Why: 4-6 wks Contact information: [place] [id number] [phone]       [name], [place]. Schedule an appointment as soon as possible for a visit.   Specialty: General Practice Why: This Thursday. INR will need to be checked and Coumadin dose adjusted if needed.   Contact  information: [address] [place] [address] [phone]          No Known Allergies   Procedures/Studies:    CT ANGIO HEAD W OR WO CONTRAST  Result Date: 05/03/2019 CLINICAL DATA:  Follow-up stroke.  Coronavirus infection. EXAM: CT ANGIOGRAPHY HEAD  AND NECK TECHNIQUE: Multidetector CT imaging of the head and neck was performed using the standard protocol during bolus administration of intravenous contrast. Multiplanar CT image reconstructions and MIPs were obtained to evaluate the vascular anatomy. Carotid stenosis measurements (when applicable) are obtained utilizing NASCET criteria, using the distal internal carotid diameter as the denominator. CONTRAST:  16mL OMNIPAQUE IOHEXOL 350 MG/ML SOLN COMPARISON:  MRI 2 days ago FINDINGS: CT HEAD FINDINGS Brain: Small acute infarction shown by previous MRI are not visible by CT. The CT examination is within normal limits. No sign of mass, hemorrhage, hydrocephalus or extra-axial collection. Vascular: There is atherosclerotic calcification of the major vessels at the base of the brain. Skull: Negative Sinuses: Clear Orbits: Normal Review of the MIP images confirms the above findings CTA NECK FINDINGS Aortic arch: Mild atherosclerosis of the aortic arch. No origin stenosis. Branching pattern is normal without origin stenosis. Right carotid system: Common carotid artery widely patent to the bifurcation. No atherosclerotic disease at the bifurcation. Cervical ICA is tortuous but widely patent. Left carotid system: Common carotid artery widely patent to the bifurcation. No bifurcation atherosclerotic disease. Cervical ICA is tortuous but widely patent. Vertebral arteries: Vertebral artery origins are widely patent. Both vertebral arteries appear normal through the cervical region to the foramen magnum. Skeleton: Ordinary cervical spondylosis. Other neck: No mass or lymphadenopathy.  Mild thyroid enlargement. Upper chest: Hazy/patchy  bilateral pulmonary infiltrates consistent with viral pneumonia. Review of the MIP images confirms the above findings CTA HEAD FINDINGS Anterior circulation: Both internal carotid arteries are patent through the skull base and siphon regions. No siphon stenosis. The anterior and middle cerebral vessels are patent without proximal stenosis, aneurysm or vascular malformation. No large or medium vessel occlusion. Posterior circulation: Both vertebral arteries are widely patent through the foramen magnum to the basilar. No basilar stenosis. Posterior circulation branch vessels are normal. Venous sinuses: Patent and normal. Anatomic variants: None significant. Review of the MIP images confirms the above findings IMPRESSION: No significant atherosclerotic disease. No stenosis. No intracranial large or medium vessel occlusion. Bilateral infarctions shown by MRI could be due to micro embolic disease from the heart or could be due to hypercoagulable state with small-vessel infarctions. Electronically Signed   By: [place] Chimes M.D.   On: 05/03/2019 18:22   DG Chest 1 View  Result Date: 04/18/2019 CLINICAL DATA:  Onset shortness of breath, cough, fever and chills 5 days ago. COVID-19 positive patient. EXAM: CHEST  1 VIEW COMPARISON:  Single-view of the chest 04/13/2019 and 04/08/2019. FINDINGS: Extensive multifocal airspace disease is worse on the left and has progressed since the prior examinations. No pneumothorax or pleural fluid. Heart size is normal. IMPRESSION: Worsened multifocal pneumonia. Electronically Signed   By: Inge Rise M.D.   On: 04/18/2019 10:10   DG Chest 1 View  Result Date: 04/13/2019 CLINICAL DATA:  Followup shortness of breath EXAM: CHEST  1 VIEW COMPARISON:  April 09, 2019 FINDINGS: The heart size and mediastinal contours are unchanged. There is hazy ground-glass opacities seen throughout both lungs, predominantly within the lower lungs. There is overall shallow degree of aeration. No  large airspace consolidation or pleural effusion. No acute osseous abnormality. IMPRESSION: Hazy ground-glass opacities within both lungs, predominantly within the lower lungs as on the recent CT. Electronically Signed   By: Prudencio Pair M.D.   On: 04/13/2019 13:54   CT HEAD WO CONTRAST  Result Date: 04/17/2019 CLINICAL DATA:  Focal neuro deficit.  Rule out stroke. EXAM: CT HEAD WITHOUT  CONTRAST TECHNIQUE: Contiguous axial images were obtained from the base of the skull through the vertex without intravenous contrast. COMPARISON:  None. FINDINGS: Brain: No evidence of acute infarction, hemorrhage, hydrocephalus, extra-axial collection or mass lesion/mass effect. Image quality degraded by mild motion. Vascular: Negative for hyperdense vessel Skull: Negative Sinuses/Orbits: Negative Other: None IMPRESSION: Negative CT head Electronically Signed   By: Franchot Gallo M.D.   On: 04/17/2019 19:23   CT ANGIO NECK W OR WO CONTRAST  Result Date: 05/03/2019 CLINICAL DATA:  Follow-up stroke.  Coronavirus infection. EXAM: CT ANGIOGRAPHY HEAD AND NECK TECHNIQUE: Multidetector CT imaging of the head and neck was performed using the standard protocol during bolus administration of intravenous contrast. Multiplanar CT image reconstructions and MIPs were obtained to evaluate the vascular anatomy. Carotid stenosis measurements (when applicable) are obtained utilizing NASCET criteria, using the distal internal carotid diameter as the denominator. CONTRAST:  68mL OMNIPAQUE IOHEXOL 350 MG/ML SOLN COMPARISON:  MRI 2 days ago FINDINGS: CT HEAD FINDINGS Brain: Small acute infarction shown by previous MRI are not visible by CT. The CT examination is within normal limits. No sign of mass, hemorrhage, hydrocephalus or extra-axial collection. Vascular: There is atherosclerotic calcification of the major vessels at the base of the brain. Skull: Negative Sinuses: Clear Orbits: Normal Review of the MIP images confirms the above findings  CTA NECK FINDINGS Aortic arch: Mild atherosclerosis of the aortic arch. No origin stenosis. Branching pattern is normal without origin stenosis. Right carotid system: Common carotid artery widely patent to the bifurcation. No atherosclerotic disease at the bifurcation. Cervical ICA is tortuous but widely patent. Left carotid system: Common carotid artery widely patent to the bifurcation. No bifurcation atherosclerotic disease. Cervical ICA is tortuous but widely patent. Vertebral arteries: Vertebral artery origins are widely patent. Both vertebral arteries appear normal through the cervical region to the foramen magnum. Skeleton: Ordinary cervical spondylosis. Other neck: No mass or lymphadenopathy.  Mild thyroid enlargement. Upper chest: Hazy/patchy bilateral pulmonary infiltrates consistent with viral pneumonia. Review of the MIP images confirms the above findings CTA HEAD FINDINGS Anterior circulation: Both internal carotid arteries are patent through the skull base and siphon regions. No siphon stenosis. The anterior and middle cerebral vessels are patent without proximal stenosis, aneurysm or vascular malformation. No large or medium vessel occlusion. Posterior circulation: Both vertebral arteries are widely patent through the foramen magnum to the basilar. No basilar stenosis. Posterior circulation branch vessels are normal. Venous sinuses: Patent and normal. Anatomic variants: None significant. Review of the MIP images confirms the above findings IMPRESSION: No significant atherosclerotic disease. No stenosis. No intracranial large or medium vessel occlusion. Bilateral infarctions shown by MRI could be due to micro embolic disease from the heart or could be due to hypercoagulable state with small-vessel infarctions. Electronically Signed   By: [place] Chimes M.D.   On: 05/03/2019 18:22   MR BRAIN WO CONTRAST  Result Date: 05/01/2019 CLINICAL DATA:  Focal neuro deficit, > 6 hrs, stroke suspected EXAM: MRI  HEAD WITHOUT CONTRAST TECHNIQUE: Multiplanar, multiecho pulse sequences of the brain and surrounding structures were obtained without intravenous contrast. COMPARISON:  None. FINDINGS: Brain: There is multifocal abnormal diffusion restriction within the left MCA territory. The largest area is at the base of the left central sulcus. There is a single contralateral punctate focus of reduced diffusion in the anterior right parietal white matter. Minimal chronic white matter hyperintensity. Brain volume is normal. No hydrocephalus. There are no old infarcts. No acute or chronic hemorrhage. The midline structures  are normal. Vascular: Flow voids are maintained. Skull and upper cervical spine: The bone marrow signal of the cranium and upper cervical vertebrae is normal. There is no skull base lesion. The visualized upper cervical spinal cord is normal. Sinuses/Orbits: There is no paranasal sinus fluid level or advanced mucosal thickening. There is no mastoid or middle ear effusion. The orbits are normal. Other: None IMPRESSION: 1. Multifocal acute ischemia within the left MCA territory, with largest area at the base of the left central sulcus. No hemorrhage or mass effect. 2. Single punctate focus of acute ischemia within the anterior right parietal white matter. Electronically Signed   By: Ulyses Jarred M.D.   On: 05/01/2019 22:10   DG CHEST PORT 1 VIEW  Result Date: 04/27/2019 CLINICAL DATA:  Shortness of breath. COVID-19. EXAM: PORTABLE CHEST 1 VIEW COMPARISON:  Chest x-rays dated 04/25/2019 and 05/18/2019 FINDINGS: Bilateral diffuse pulmonary infiltrates persist with no significant change since the prior exam. Heart size and vascularity are normal. No effusions. No bone abnormality. IMPRESSION: No change in the appearance of the chest since the prior study. Electronically Signed   By: Lorriane Shire M.D.   On: 04/27/2019 09:27   DG CHEST PORT 1 VIEW  Result Date: 04/25/2019 CLINICAL DATA:  Hypoxia in a patient  who is COVID-19 positive. EXAM: PORTABLE CHEST 1 VIEW COMPARISON:  Single-view of the chest 04/18/2019 and 04/13/2019 FINDINGS: Extensive bilateral airspace disease persists. Aeration in the right lung base is mildly improved since the most recent exam. No pneumothorax or pleural effusion. Heart size is normal. No acute or focal bony abnormality. IMPRESSION: Multifocal pneumonia appears mildly improved in the right lung base. Electronically Signed   By: Inge Rise M.D.   On: 04/25/2019 11:28   DG Chest Port 1 View  Result Date: 04/08/2019 CLINICAL DATA:  Shortness of breath, fever EXAM: PORTABLE CHEST 1 VIEW COMPARISON:  12/13/2005 FINDINGS: Low lung volumes. No focal airspace disease or effusion. Stable cardiomediastinal silhouette. No pneumothorax. IMPRESSION: No active disease. Electronically Signed   By: Donavan Foil M.D.   On: 04/08/2019 23:13   VAS [place] TRANSCRANIAL DOPPLER W BUBBLES  Result Date: 05/04/2019  Transcranial Doppler with Bubble Indications: Stroke. Comparison Study: no prior Performing Technologist: Abram Sander RVS  Examination Guidelines: A complete evaluation includes B-mode imaging, spectral Doppler, color Doppler, and power Doppler as needed of all accessible portions of each vessel. Bilateral testing is considered an integral part of a complete examination. Limited examinations for reoccurring indications may be performed as noted.  Summary:  A vascular evaluation was performed. The right middle cerebral artery was studied. An IV was inserted into the patient's right forearm . Verbal informed consent was obtained.  No HITS heard at rest. No HITS heard during valsalva. Negative TCD Bubble study *See table(s) above for measurements and observations.  Diagnosing physician: Antony Contras MD Electronically signed by Antony Contras MD on 05/04/2019 at 1:08:48 PM.    Final    ECHOCARDIOGRAM COMPLETE  Result Date: 04/10/2019   ECHOCARDIOGRAM REPORT   Patient Name:   [name]  Date of Exam: 04/10/2019 Medical Rec #:  [id number]       Height:       66.0 in Accession #:    [id number]      Weight:       230.0 lb Date of Birth:  [birth year]       BSA:          2.12 m Patient Age:  63 years        BP:           132/90 mmHg Patient Gender: M               HR:           89 bpm. Exam Location:  Inpatient Procedure: 2D Echo, Cardiac Doppler and Color Doppler Indications:    Pulmonary Embolus  History:        Patient has no prior history of Echocardiogram examinations.                 Risk Factors:Hypertension. Resp. failure, Pul. Emboli, PNA.  Sonographer:    Dustin Flock Referring Phys: [id number] ANKIT CHIRAG AMIN IMPRESSIONS  1. Left ventricular ejection fraction, by visual estimation, is 60 to 65%. The left ventricle has normal function. There is mildly increased left ventricular hypertrophy.  2. Left ventricular diastolic parameters are consistent with Grade I diastolic dysfunction (impaired relaxation).  3. Global right ventricle has normal systolic function.The right ventricular size is normal.  4. Left atrial size was normal.  5. Right atrial size was normal.  6. The mitral valve is normal in structure. No evidence of mitral valve regurgitation. No evidence of mitral stenosis.  7. The tricuspid valve is normal in structure. Tricuspid valve regurgitation is mild.  8. The aortic valve is tricuspid. Aortic valve regurgitation is not visualized. No evidence of aortic valve sclerosis or stenosis.  9. The pulmonic valve was not well visualized. Pulmonic valve regurgitation is not visualized. 10. Technically difficult; normal LV systolic function; mild LVH; grade1 diastolic dysfunction; mild TR with peak velocity of 3.8 m/s suggestive of at least moderate pulmonary hypertension; IVC not visualized and therefore PASP cannot be estimated. FINDINGS  Left Ventricle: Left ventricular ejection fraction, by visual estimation, is 60 to 65%. The left ventricle has normal function. There is mildly  increased left ventricular hypertrophy. Left ventricular diastolic parameters are consistent with Grade I diastolic dysfunction (impaired relaxation). Normal left atrial pressure. Right Ventricle: The right ventricular size is normal. Global RV systolic function is has normal systolic function. Left Atrium: Left atrial size was normal in size. Right Atrium: Right atrial size was normal in size Pericardium: There is no evidence of pericardial effusion. Mitral Valve: The mitral valve is normal in structure. No evidence of mitral valve stenosis by observation. No evidence of mitral valve regurgitation. Tricuspid Valve: The tricuspid valve is normal in structure. Tricuspid valve regurgitation is mild. Aortic Valve: The aortic valve is tricuspid. Aortic valve regurgitation is not visualized. The aortic valve is structurally normal, with no evidence of sclerosis or stenosis. Pulmonic Valve: The pulmonic valve was not well visualized. Pulmonic valve regurgitation is not visualized. Aorta: The aortic root is normal in size and structure. Venous: The inferior vena cava was not well visualized.  LEFT VENTRICLE PLAX 2D LVIDd:         3.59 cm  Diastology LVIDs:         2.29 cm  LV e' lateral:   8.49 cm/s LV PW:         1.27 cm  LV E/e' lateral: 8.3 LV IVS:        1.34 cm  LV e' medial:    8.05 cm/s LVOT diam:     2.30 cm  LV E/e' medial:  8.7 LV SV:         36 ml LV SV Index:   16.07 LVOT Area:     4.15 cm  RIGHT VENTRICLE RV Basal diam:  2.57 cm RV S prime:     9.32 cm/s TAPSE (M-mode): 2.6 cm LEFT ATRIUM             Index       RIGHT ATRIUM          Index LA diam:        3.20 cm 1.51 cm/m  RA Area:     8.77 cm LA Vol (A2C):   14.2 ml 6.69 ml/m  RA Volume:   15.50 ml 7.30 ml/m LA Vol (A4C):   23.4 ml 11.03 ml/m LA Biplane Vol: 18.5 ml 8.72 ml/m  AORTIC VALVE LVOT Vmax:   126.00 cm/s LVOT Vmean:  71.700 cm/s LVOT VTI:    0.229 m  AORTA Ao Root diam: 3.10 cm MITRAL VALVE                        TRICUSPID VALVE MV Area  (PHT): 2.54 cm             TR Peak grad:   57.8 mmHg MV PHT:        86.71 msec           TR Vmax:        380.00 cm/s MV Decel Time: 299 msec MV E velocity: 70.30 cm/s 103 cm/s  SHUNTS MV A velocity: 87.80 cm/s 70.3 cm/s Systemic VTI:  0.23 m MV E/A ratio:  0.80       1.5       Systemic Diam: 2.30 cm  Kirk Ruths MD Electronically signed by Kirk Ruths MD Signature Date/Time: 04/10/2019/3:12:55 PM    Final    CT Angio Chest/Abd/Pel for Dissection W and/or W/WO  Addendum Date: 04/09/2019   ADDENDUM REPORT: 04/09/2019 20:47 ADDENDUM: Critical Value/emergent results were called by telephone at the time of interpretation on 04/09/2019 at 8:37 pm to provider Dr Shanon Brow, who verbally acknowledged these results. Electronically Signed   By: Julian Hy M.D.   On: 04/09/2019 20:47   Result Date: 04/09/2019 CLINICAL DATA:  Shortness of breath, COVID positive, positive D-dimer, evaluate for PE EXAM: CT ANGIOGRAPHY CHEST, ABDOMEN AND PELVIS TECHNIQUE: Multidetector CT imaging through the chest, abdomen and pelvis was performed using the standard protocol during bolus administration of intravenous contrast. Multiplanar reconstructed images and MIPs were obtained and reviewed to evaluate the vascular anatomy. CONTRAST:  158mL OMNIPAQUE IOHEXOL 350 MG/ML SOLN COMPARISON:  Chest radiograph dated 04/08/2019 FINDINGS: CTA CHEST FINDINGS Cardiovascular: On unenhanced CT, there is no evidence of intramural hematoma. Preferential opacification of the bilateral pulmonary arteries to the segmental level. Segmental and subsegmental pulmonary emboli within branches of the right lower lobe pulmonary artery (series 7/images 214 and 229). Overall clot burden is mild. No evidence of right heart strain. Although not tailored for evaluation of the thoracic aorta, there is no evidence of thoracic aortic aneurysm or dissection. Mild atherosclerotic calcifications of the aortic arch. The heart is normal in size.  No pericardial  effusion. Mediastinum/Nodes: Small mediastinal lymph nodes which do not meet pathologic CT size criteria. Visualized thyroid is unremarkable. Lungs/Pleura: Multifocal/diffuse ground-glass opacities throughout the lungs bilaterally, with relative sparing of the lung apices, and sparing of the subpleural lungs. This appearance is nonspecific but is likely related to the patient's known COVID. No underlying suspicious pulmonary nodules. No pleural effusion or pneumothorax. Musculoskeletal: Mild degenerative changes of the thoracic spine. Review of the MIP images confirms the above findings. CTA ABDOMEN AND PELVIS  FINDINGS VASCULAR Aorta: No evidence abdominal aortic aneurysm or dissection.  Patent. Celiac: Patent. SMA: Patent. Renals: Patent bilaterally. Mild atherosclerotic calcifications at the origin on the left. IMA: Patent. Inflow: Patent.  Atherosclerotic calcifications bilaterally. Veins: Grossly unremarkable. Review of the MIP images confirms the above findings. NON-VASCULAR Hepatobiliary: Liver is within normal limits. Status post cholecystectomy. No intrahepatic or extrahepatic ductal dilatation. Pneumobilia. Pancreas: Within normal limits. Spleen: Within normal limits. Adrenals/Urinary Tract: Adrenal glands are within normal limits. 4 mm nonobstructing right lower pole renal calculus (series 7/image 49). 10 mm posterior left lower pole renal cyst (series 7/image 478), benign. No hydronephrosis. Thick-walled bladder, although underdistended. Stomach/Bowel: Stomach is within normal limits. No evidence of bowel obstruction. Normal appendix (series 7/image 47). Mild sigmoid diverticulosis, without evidence of diverticulitis. Lymphatic: No suspicious abdominopelvic lymphadenopathy. Reproductive: Prostate is unremarkable. Other: No abdominopelvic ascites. Musculoskeletal: Visualized osseous structures are within normal limits. Review of the MIP images confirms the above findings. IMPRESSION: Segmental and  subsegmental pulmonary embolism within branches of the right lower lobe pulmonary artery. Overall clot burden is mild. No evidence of right heart strain. No evidence of thoracoabdominal aortic aneurysm or dissection. Multifocal/diffuse ground-glass opacities in the lungs bilaterally, likely related to the patient's known COVID. 4 mm nonobstructing right lower pole renal calculus. No hydronephrosis. Electronically Signed: By: Julian Hy M.D. On: 04/09/2019 20:28   VAS US CAROTID (at [facility] and [facility] only)  Result Date: 05/03/2019 Carotid Arterial Duplex Study Indications:       CVA, Weakness and Covid positive, positive for DVT. Risk Factors:      Hypertension, hyperlipidemia. Limitations        Today's exam was limited due to the high bifurcation of the                    carotid. Comparison Study:  No prior study on file for comparison Performing Technologist: Sharion Dove RVS  Examination Guidelines: A complete evaluation includes B-mode imaging, spectral Doppler, color Doppler, and power Doppler as needed of all accessible portions of each vessel. Bilateral testing is considered an integral part of a complete examination. Limited examinations for reoccurring indications may be performed as noted.  Right Carotid Findings: +----------+--------+--------+--------+------------------+--------+           PSV cm/sEDV cm/sStenosisPlaque DescriptionComments +----------+--------+--------+--------+------------------+--------+ CCA Prox  132     18              homogeneous                +----------+--------+--------+--------+------------------+--------+ CCA Distal116     15              homogeneous                +----------+--------+--------+--------+------------------+--------+ ICA Prox  72      16              homogeneous                +----------+--------+--------+--------+------------------+--------+ ICA Distal89                                                  +----------+--------+--------+--------+------------------+--------+ ECA       134     18                                         +----------+--------+--------+--------+------------------+--------+ +---------+--------+--+--------+--+  VertebralPSV cm/s71EDV cm/s18 +---------+--------+--+--------+--+  Left Carotid Findings: +----------+--------+--------+--------+------------------+------------------+           PSV cm/sEDV cm/sStenosisPlaque DescriptionComments           +----------+--------+--------+--------+------------------+------------------+ CCA Prox  157     25                                intimal thickening +----------+--------+--------+--------+------------------+------------------+ CCA Distal126     24                                intimal thickening +----------+--------+--------+--------+------------------+------------------+ ICA Prox  63      9               homogeneous                          +----------+--------+--------+--------+------------------+------------------+ ICA Distal114     37                                                   +----------+--------+--------+--------+------------------+------------------+ ECA       135     14                                                   +----------+--------+--------+--------+------------------+------------------+ +----------+--------+--------+--------+-------------------+           PSV cm/sEDV cm/sDescribeArm Pressure (mmHG) +----------+--------+--------+--------+-------------------+ [id number]                                         +----------+--------+--------+--------+-------------------+ +---------+--------+--+--------+-+ VertebralPSV cm/s53EDV cm/s7 +---------+--------+--+--------+-+  Summary: Right Carotid: The extracranial vessels were near-normal with only minimal wall                thickening or plaque. Left Carotid: The extracranial vessels were near-normal with only  minimal wall               thickening or plaque. Vertebrals:  Bilateral vertebral arteries demonstrate antegrade flow. Subclavians: Normal flow hemodynamics were seen in bilateral subclavian              arteries. *See table(s) above for measurements and observations.  Electronically signed by Antony Contras MD on 05/03/2019 at 1:13:23 PM.    Final    VAS [place] LOWER EXTREMITY VENOUS (DVT)  Result Date: 05/03/2019  Lower Venous Study Indications: Stroke, and Covid positive, recent history of left posterior tibial DVT found 04/10/19.  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 Comparison Study: Prior study on file for comparison from 04/10/19 Performing Technologist: Sharion Dove RVS  Examination Guidelines: A complete evaluation includes B-mode imaging, spectral Doppler, color Doppler, and power Doppler as needed of all accessible portions of each vessel. Bilateral testing is considered an integral part of a complete examination. Limited examinations for reoccurring indications may be performed as noted.  +---------+---------------+---------+-----------+----------+-------------------+ RIGHT    CompressibilityPhasicitySpontaneityPropertiesThrombus Aging      +---------+---------------+---------+-----------+----------+-------------------+ CFV      Full           Yes  Yes                                      +---------+---------------+---------+-----------+----------+-------------------+ SFJ      Full                                                             +---------+---------------+---------+-----------+----------+-------------------+ FV Prox  Full                                                             +---------+---------------+---------+-----------+----------+-------------------+ FV Mid   Full                                                             +---------+---------------+---------+-----------+----------+-------------------+ FV DistalFull                                                              +---------+---------------+---------+-----------+----------+-------------------+ PFV      Full                                                             +---------+---------------+---------+-----------+----------+-------------------+ POP                                                   patent by color and                                                       Doppler             +---------+---------------+---------+-----------+----------+-------------------+   +---------+---------------+---------+-----------+----------+--------------+ LEFT     CompressibilityPhasicitySpontaneityPropertiesThrombus Aging +---------+---------------+---------+-----------+----------+--------------+ CFV      Full           Yes      Yes                                 +---------+---------------+---------+-----------+----------+--------------+ SFJ      Full                                                        +---------+---------------+---------+-----------+----------+--------------+  FV Prox  Full                                                        +---------+---------------+---------+-----------+----------+--------------+ FV Mid   Full                                                        +---------+---------------+---------+-----------+----------+--------------+ FV DistalFull                                                        +---------+---------------+---------+-----------+----------+--------------+ PFV      Full                                                        +---------+---------------+---------+-----------+----------+--------------+ POP      Full           Yes      Yes                                 +---------+---------------+---------+-----------+----------+--------------+ PTV      None                                         Acute           +---------+---------------+---------+-----------+----------+--------------+ PERO     None                                         Acute          +---------+---------------+---------+-----------+----------+--------------+     Summary: Right: There is no evidence of deep vein thrombosis in the lower extremity. Left: Findings consistent with acute deep vein thrombosis involving the left posterior tibial veins, and left peroneal veins. Findings suggest new clot progression as compared to previous examination. DVT remains in left posterior tibial veins and is now  visualized in the left peroneal veins.  *See table(s) above for measurements and observations. Electronically signed by Monica Martinez MD on 05/03/2019 at 1:50:02 PM.    Final    VAS [place] LOWER EXTREMITY VENOUS (DVT)  Result Date: 04/10/2019  Lower Venous Study Indications: Swelling, and Positive Covid-19.  Risk Factors: Confirmed PE Surgery Right below knee amputation. Anticoagulation: Lovenox. Comparison Study: No priors. Performing Technologist: Oda Cogan RDMS, RVT  Examination Guidelines: A complete evaluation includes B-mode imaging, spectral Doppler, color Doppler, and power Doppler as needed of all accessible portions of each vessel. Bilateral testing is considered an integral part of a complete examination. Limited examinations for reoccurring indications may be performed as noted.  +---------+---------------+---------+-----------+----------+--------------+ RIGHT    CompressibilityPhasicitySpontaneityPropertiesThrombus Aging +---------+---------------+---------+-----------+----------+--------------+  CFV      Full           Yes      Yes                                 +---------+---------------+---------+-----------+----------+--------------+ SFJ      Full                                                        +---------+---------------+---------+-----------+----------+--------------+ FV Prox  Full                                                         +---------+---------------+---------+-----------+----------+--------------+ FV Mid   Full                                                        +---------+---------------+---------+-----------+----------+--------------+ FV DistalFull                                                        +---------+---------------+---------+-----------+----------+--------------+ PFV      Full                                                        +---------+---------------+---------+-----------+----------+--------------+ POP      Full           Yes      Yes                                 +---------+---------------+---------+-----------+----------+--------------+ PTV                                                   BKA            +---------+---------------+---------+-----------+----------+--------------+ PERO                                                  BKA            +---------+---------------+---------+-----------+----------+--------------+   +---------+---------------+---------+-----------+----------+--------------+ LEFT     CompressibilityPhasicitySpontaneityPropertiesThrombus Aging +---------+---------------+---------+-----------+----------+--------------+ CFV      Full           Yes      Yes                                 +---------+---------------+---------+-----------+----------+--------------+  SFJ      Full                                                        +---------+---------------+---------+-----------+----------+--------------+ FV Prox  Full                                                        +---------+---------------+---------+-----------+----------+--------------+ FV Mid   Full                                                        +---------+---------------+---------+-----------+----------+--------------+ FV DistalFull                                                         +---------+---------------+---------+-----------+----------+--------------+ PFV      Full                                                        +---------+---------------+---------+-----------+----------+--------------+ POP      Full           Yes      Yes                                 +---------+---------------+---------+-----------+----------+--------------+ PTV      None           No       No                   Acute          +---------+---------------+---------+-----------+----------+--------------+ PERO                                                  Not visualized +---------+---------------+---------+-----------+----------+--------------+    Summary: Right: There is no evidence of deep vein thrombosis in the lower extremity. Left: Findings consistent with acute deep vein thrombosis involving the left posterior tibial veins.  *See table(s) above for measurements and observations. Electronically signed by Deitra Mayo MD on 04/10/2019 at 5:02:20 PM.    Final    ECHOCARDIOGRAM LIMITED  Result Date: 05/03/2019   ECHOCARDIOGRAM LIMITED REPORT   Patient Name:   [name] Date of Exam: 05/03/2019 Medical Rec #:  [id number]       Height:       66.0 in Accession #:    [id number]      Weight:       194.1 lb Date of Birth:  16-Apr-1956  BSA:          1.97 m Patient Age:    50 years        BP:           134/90 mmHg Patient Gender: M               HR:           94 bpm. Exam Location:  Inpatient  Procedure: Limited Echo, Cardiac Doppler and Limited Color Doppler Indications:    stroke 434.91  History:        Patient has prior history of Echocardiogram examinations, most                 recent 04/10/2019.  Sonographer:    Johny Chess Referring Phys: [id number] Patrecia Pour  Sonographer Comments: Image acquisition challenging due to respiratory motion. IMPRESSIONS  1. Left ventricular ejection fraction, by visual estimation, is 60 to 65%. The left ventricle has normal  function. There is no left ventricular hypertrophy.  2. Left ventricular diastolic parameters are consistent with Grade I diastolic dysfunction (impaired relaxation).  3. The left ventricle has no regional wall motion abnormalities.  4. Global right ventricle has normal systolic function.The right ventricular size is normal.  5. Left atrial size was normal.  6. Right atrial size was normal.  7. The mitral valve is normal in structure. No evidence of mitral valve regurgitation. No evidence of mitral stenosis.  8. The tricuspid valve is normal in structure. Tricuspid valve regurgitation is not demonstrated.  9. The aortic valve is normal in structure. Aortic valve regurgitation is mild. Mild aortic valve sclerosis without stenosis. 10. The pulmonic valve was normal in structure. Pulmonic valve regurgitation is not visualized. 11. Normal LV systolic function; grade 1 diastolic dysfunction; mildly sclerotic aortic valve with mild AI. FINDINGS  Left Ventricle: Left ventricular ejection fraction, by visual estimation, is 60 to 65%. The left ventricle has normal function. The left ventricle has no regional wall motion abnormalities. There is no left ventricular hypertrophy. Left ventricular diastolic parameters are consistent with Grade I diastolic dysfunction (impaired relaxation). Normal left atrial pressure. Right Ventricle: The right ventricular size is normal.Global RV systolic function is has normal systolic function. Left Atrium: Left atrial size was normal in size. Right Atrium: Right atrial size was normal in size Pericardium: There is no evidence of pericardial effusion. Mitral Valve: The mitral valve is normal in structure. No evidence of mitral valve stenosis by observation. MV Area by PHT, 3.17 cm. MV PHT, 69.31 msec. No evidence of mitral valve regurgitation. Tricuspid Valve: The tricuspid valve is normal in structure. Tricuspid valve regurgitation is not demonstrated. Aortic Valve: The aortic valve is  normal in structure. Aortic valve regurgitation is mild. Mild aortic valve sclerosis is present, with no evidence of aortic valve stenosis. Pulmonic Valve: The pulmonic valve was normal in structure. Pulmonic valve regurgitation is not visualized. Aorta: The aortic root, ascending aorta and aortic arch are all structurally normal, with no evidence of dilitation or obstruction. Venous: The inferior vena cava was not well visualized.   LEFT VENTRICLE          Normals PLAX 2D LVIDd:         3.87 cm  3.6 cm   Diastology                  Normals LVIDs:         2.75 cm  1.7 cm   LV e' lateral:  10.80 cm/s 6.42 cm/s LV PW:         1.04 cm  1.4 cm   LV E/e' lateral: 9.4        15.4 LV IVS:        1.00 cm  1.3 cm   LV e' medial:    7.29 cm/s  6.96 cm/s LVOT diam:     2.40 cm  2.0 cm   LV E/e' medial:  13.9       6.96 LV SV:         36 ml    79 ml LV SV Index:   17.74    45 ml/m2 LVOT Area:     4.52 cm 3.14 cm2  LEFT ATRIUM         Index LA diam:    3.50 cm 1.77 cm/m  AORTIC VALVE             Normals LVOT Vmax:   116.00 cm/s LVOT Vmean:  77.600 cm/s 75 cm/s LVOT VTI:    0.236 m     25.3 cm  AORTA                 Normals Ao Root diam: 3.30 cm 31 mm Ao Asc diam:  3.40 cm 31 mm MITRAL VALVE              Normals MV Area (PHT): 3.17 cm              SHUNTS MV PHT:        69.31 msec 55 ms      Systemic VTI:  0.24 m MV Decel Time: 239 msec   187 ms     Systemic Diam: 2.40 cm MV E velocity: 101.00 cm/s 103 cm/s MV A velocity: 115.00 cm/s 70.3 cm/s MV E/A ratio:  0.88        1.5  Kirk Ruths MD Electronically signed by Kirk Ruths MD Signature Date/Time: 05/03/2019/9:22:25 AM    Final      The results of significant diagnostics from this hospitalization (including imaging, microbiology, ancillary and laboratory) are listed below for reference.     Microbiology: No results found for this or any previous visit (from the past 240 hour(s)).   Labs: BNP (last 3 results) Recent Labs    04/09/19 0445 04/10/19 0610  04/27/19 0039  BNP 77.8 43.8 123456   Basic Metabolic Panel: Recent Labs  Lab 05/01/19 0341 05/02/19 0610  NA 136 136  K 4.6 4.8  CL 93* 93*  CO2 32 33*  GLUCOSE 100* 110*  BUN 21 19  CREATININE 0.87 0.89  CALCIUM 8.9 8.9  MG 2.2 2.2   Liver Function Tests: Recent Labs  Lab 05/01/19 0341 05/02/19 0610  AST 47* 36  ALT 82* 80*  ALKPHOS 71 68  BILITOT 1.9* 1.5*  PROT 7.2 6.9  ALBUMIN 3.8 3.6   No results for input(s): LIPASE, AMYLASE in the last 168 hours. No results for input(s): AMMONIA in the last 168 hours. CBC: Recent Labs  Lab 05/01/19 0341 05/02/19 0610 05/05/19 0218 05/06/19 0242 05/07/19 0242  WBC 10.2 9.5 9.5 9.0 8.0  NEUTROABS 7.2 6.5  --   --   --   HGB 14.8 15.0 13.4 13.7 12.9*  HCT 45.1 46.0 41.1 40.9 40.2  MCV 94.7 96.4 96.3 95.6 96.9  PLT 159 180 215 251 241   Cardiac Enzymes: No results for input(s): CKTOTAL, CKMB, CKMBINDEX, TROPONINI in the last 168 hours. BNP: Invalid input(s): POCBNP  CBG: Recent Labs  Lab 05/06/19 0614 05/06/19 1233 05/06/19 1514 05/06/19 2127 05/07/19 0840  GLUCAP 110* 254* 132* 127* 95   D-Dimer No results for input(s): DDIMER in the last 72 hours. Hgb A1c No results for input(s): HGBA1C in the last 72 hours. Lipid Profile No results for input(s): CHOL, HDL, LDLCALC, TRIG, CHOLHDL, LDLDIRECT in the last 72 hours. Thyroid function studies No results for input(s): TSH, T4TOTAL, T3FREE, THYROIDAB in the last 72 hours.  Invalid input(s): FREET3 Anemia work up No results for input(s): VITAMINB12, FOLATE, FERRITIN, TIBC, IRON, RETICCTPCT in the last 72 hours. Urinalysis    Component Value Date/Time   BILIRUBINUR negative 12/28/2018 1717   PROTEINUR Negative 12/28/2018 1717   UROBILINOGEN 0.2 12/28/2018 1717   NITRITE negative 12/28/2018 1717   LEUKOCYTESUR Negative 12/28/2018 1717   Sepsis Labs Invalid input(s): PROCALCITONIN,  WBC,  LACTICIDVEN Microbiology No results found for this or any previous  visit (from the past 240 hour(s)).   Time coordinating discharge in minutes: 65  SIGNED:   Debbe Odea, MD  [facility] 05/07/2019, 9:35 AM Pager   If 7PM-7AM, please contact night-coverage [email or web address] Password [email or web address]

## 2019-05-07 NOTE — Progress Notes (Signed)
ANTICOAGULATION CONSULT NOTE  Pharmacy Consult for Heparin and Warfarin Indication: DVT, PE, and CVA  No Known Allergies  Patient Measurements: Height: 5\' 6"  (167.6 cm) Weight: 203 lb 0.7 oz (92.1 kg) IBW/kg (Calculated) : 63.8 Heparin Dosing Weight: 82.3 kg  Vital Signs: Temp: 97.8 F (36.6 C) (12/14 0420) Temp Source: Oral (12/14 0420) BP: 128/85 (12/14 0420) Pulse Rate: 69 (12/14 0420)  Labs: Recent Labs    05/05/19 0218 05/05/19 0218 05/06/19 0242 05/06/19 1029 05/06/19 1251 05/06/19 2216 05/07/19 0242  HGB 13.4  --  13.7  --   --   --  12.9*  HCT 41.1  --  40.9  --   --   --  40.2  PLT 215  --  251  --   --   --  241  APTT 52*   < > 90* 124* 148* 126*  --   LABPROT 13.3  --  15.9*  --   --   --  23.0*  INR 1.0  --  1.3*  --   --   --  2.0*  HEPARINUNFRC 0.70  --   --  0.84* 0.96* 0.55 0.62   < > = values in this interval not displayed.    Estimated Creatinine Clearance: 90.2 mL/min (by C-G formula based on SCr of 0.89 mg/dL).   Assessment: 30 YOM to continue on IV heparin for recent PE/DVT and CVA.  We have been using aPTT to guide heparin dosing since Xarelto could falsely elevate heparin levels, but this effect is likely minimal at this point. Patients 1100am HL and aPTT both came back elevated despite decreasing the rate after the last level.   Overlap of heparin and warfarin at 4 days and INR now 2.0.  Will d/c heparin and continue warfarin therapy.  Goal of Therapy:  INR 2-3  Plan:  Warfarin 5 mg tonight  Discontinue hepain drip F/u Daily INR  Thanks for allowing pharmacy to be a part of this patient's care.  Alanda Slim, PharmD, [facility] Clinical Pharmacist Please see AMION for all Pharmacists' Contact Phone Numbers 05/07/2019, 8:28 AM

## 2019-05-07 NOTE — Progress Notes (Signed)
SATURATION QUALIFICATIONS: (This note is used to comply with regulatory documentation for home oxygen)  Patient Saturations on Room Air at Rest = 96%  Patient Saturations on Room Air while Ambulating and with activity = 83%  Patient Saturations on 4 Liters of oxygen while Ambulating and with acivity= 97%  Please briefly explain why patient needs home oxygen: Patient requires at least 4L of oxygen while preforming ADLs including put on prostatic leg. Staturations dropped as low as 83% with activities without oxygen, RR increased to 30 and HR as high as 130 during activity.

## 2019-05-07 NOTE — Progress Notes (Addendum)
Occupational Therapy Treatment Patient Details Name: [name] MRN: [id number] DOB: [birth year] Today's Date: 05/07/2019    History of present illness 63 y/o male w/ hx liver disorder, HTN, HLD, R BKA, GERD presented to ED with SOB, cough, fever and chills, admitted with dx acute hypoxic resp failure sec to SARS COVID 19. Follow-up CT chest showed segmental and subsegmental PE within branches of the right lower lobe. Patient noted to have rt hand weakness and incoordination; head CT 04/17/19 showing no acute changes. 04/23/19 pt reports no improvement and no worsening of rt hand. MRI 12/8 with multifocal acute ischemia in L MCA territory.   OT comments  Pt sitting in the recliner upon arrival, he is on 2lnc with SpO2 98%-100%, HR 138bpm briefly. HR up to 122bpm with standing.  Pt demonstrates decreased activity tolerance and fatigues easily during ADL. SpO2 dropped to 86% with donning prosthetic, pt required 4lnc during ADL and functional mobility. He required increased time and effort during LB dressing with 3 seated rest breaks for about 3-5 min. Pt required supervision for safety during ADL and functional mobility. While sitting on BSC, RR up to 30, SpO2 low 80s following stand-pivot to return to recliner, required 58min to return RR to 14, SpO2 to >96% on 5lnc with pursed lip breathing. HR 95-122 throughout session. RN aware of all vitals.  Pt will continue to benefit from skilled OT services to maximize safety and independence with ADL/IADL and functional mobility. Will continue to follow acutely and progress as tolerated.    Follow Up Recommendations  Home health OT;Supervision/Assistance - 24 hour    Equipment Recommendations  3in1   Recommendations for Other Services      Precautions / Restrictions Precautions Precautions: Fall;Other (comment) Precaution Comments: Watch O2; R BKA (has prosthetic in room) Required Braces or Orthoses: Other Brace Other Brace: prosthetic leg,  congenital Restrictions Weight Bearing Restrictions: No       Mobility Bed Mobility               General bed mobility comments: sitting in recliner upon arrival  Transfers Overall transfer level: Needs assistance Equipment used: Rolling walker (2 wheeled) Transfers: Sit to/from [facility] Sit to Stand: Supervision Stand pivot transfers: Supervision       General transfer comment: supervision for safety    Balance Overall balance assessment: Needs assistance Sitting-balance support: Feet supported Sitting balance-Leahy Scale: Normal Sitting balance - Comments: able to don prosthesis without LOB    Standing balance support: During functional activity Standing balance-Leahy Scale: Fair Standing balance comment: requires UE support intermittently, but need increases as he fatigues                            ADL either performed or assessed with clinical judgement   ADL Overall ADL's : Needs assistance/impaired Eating/Feeding: Independent;Sitting                   Lower Body Dressing: Supervision/safety;Sit to/from stand Lower Body Dressing Details (indicate cue type and reason): supervision for safety O2 sats decreased to 86% on 2lnc, pt demonstrated difficulty returning spo2 to 90s with pursed lip breathing and rest break, bumped O2 up to 4lnc SpO2 90-95% during LB dressing;pt required 3 rest breaks while donning pants and prosthetic Toilet Transfer: Supervision/safety;Stand-pivot;BSC Toilet Transfer Details (indicate cue type and reason): pt with c/o SOB while sitting on BSC, RR up to 30, SpO2 94% with poor reading  Functional mobility during ADLs: Supervision/safety;Rolling walker General ADL Comments: pt requires increased time for ADL completion;he fatigues easily and requires frequent rest breaks;educated pt on energy conservation strategies to implement during ADL      Vision       Perception     Praxis       Cognition Arousal/Alertness: Awake/alert Behavior During Therapy: WFL for tasks assessed/performed Overall Cognitive Status: Within Functional Limits for tasks assessed                                 General Comments: demonstrates good carry over from previous sessions;problem solving appears WNL today        Exercises Exercises: Other exercises Other Exercises Other Exercises: fine motor exercise handout provided; Other Exercises: med soft theraputty provided with HEP, pt completed 2 exercises to demonstrate understanding   Shoulder Instructions       General Comments SpO2 on 2lnc upon arrival, HR up to 138bpm with sitting on edge of recliner;SpO2 dropped to 86% during LB dressing, required up to 5Lnc for functional mobility due to desat to low 80s with stand pivot;RR up to 30 during stand-pivot transfer    Pertinent Vitals/ Pain       Pain Assessment: Faces Faces Pain Scale: Hurts little more Pain Location: L great toe, states has gout Pain Descriptors / Indicators: Grimacing;Guarding;Moaning Pain Intervention(s): Limited activity within patient's tolerance;Monitored during session  Home Living                                          Prior Functioning/Environment              Frequency  Min 3X/week        Progress Toward Goals  OT Goals(current goals can now be found in the care plan section)  Progress towards OT goals: Progressing toward goals  Acute Rehab OT Goals Patient Stated Goal: see his wife;get back to work OT Goal Formulation: With patient Time For Goal Achievement: 05/16/19 Potential to Achieve Goals: Good ADL Goals Pt Will Perform Lower Body Bathing: with modified independence;sit to/from stand Pt Will Perform Lower Body Dressing: with modified independence;sit to/from stand Pt Will Perform Toileting - Clothing Manipulation and hygiene: with modified independence Pt Will Perform Tub/Shower Transfer: with  modified independence;ambulating;Shower transfer;3 in 1 Pt/caregiver will Perform Home Exercise Program: Independently;With theraband;Increased strength;Both right and left upper extremity;With written HEP provided Additional ADL Goal #1: Pt will independently verbalize 3 strategies to reduce risk of falls  Plan Discharge plan remains appropriate    Co-evaluation                 AM-PAC OT "6 Clicks" Daily Activity     Outcome Measure   Help from another person eating meals?: None Help from another person taking care of personal grooming?: A Little Help from another person toileting, which includes using toliet, bedpan, or urinal?: A Little Help from another person bathing (including washing, rinsing, drying)?: A Little Help from another person to put on and taking off regular upper body clothing?: A Little Help from another person to put on and taking off regular lower body clothing?: A Little 6 Click Score: 19    End of Session Equipment Utilized During Treatment: Rolling walker;Oxygen  OT Visit Diagnosis: Muscle weakness (generalized) (M62.81)   Activity Tolerance Patient  tolerated treatment well   Patient Left in chair;with call bell/phone within reach   Nurse Communication Mobility status        Time: [id number] OT Time Calculation (min): 67 min  Charges: OT General Charges $OT Visit: 1 Visit OT Treatments $Self Care/Home Management : 38-52 mins $Therapeutic Exercise: 8-22 mins  Dorinda Hill OTR/L Acute Rehabilitation Services Office: [place] 05/07/2019, 11:13 AM

## 2019-05-07 NOTE — Plan of Care (Signed)
Patient progressing towards goals at this time.

## 2019-05-07 NOTE — Progress Notes (Signed)
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 A walker will not resolve  issue with performing activities of daily living. A wheelchair will allow patient to safely perform daily activities. Patient is not able to propel themselves in the home using a standard weight wheelchair due to weakness. Patient can self propel in the lightweight wheelchair. Length of need 6 months.  Accessories: elevating leg rests (ELRs), wheel locks, extensions and anti-tippers.

## 2019-05-08 ENCOUNTER — Telehealth: Payer: Self-pay

## 2019-05-08 LAB — CARDIOLIPIN ANTIBODIES, IGG, IGM, IGA
Anticardiolipin IgA: <9 APL U/mL (ref 0–11)
Anticardiolipin IgG: 18 GPL U/mL — ABNORMAL HIGH (ref 0–14)
Anticardiolipin IgM: 31 MPL U/mL — ABNORMAL HIGH (ref 0–12)

## 2019-05-08 LAB — FACTOR 5 LEIDEN

## 2019-05-08 LAB — PROTHROMBIN GENE MUTATION

## 2019-05-08 NOTE — Telephone Encounter (Signed)
This nurse attempted to contact patient in order to perform transition of care call and schedule hospital follow up appointment. Voicemail is full and unable to leave a message.

## 2019-05-09 DIAGNOSIS — J159 Unspecified bacterial pneumonia: Secondary | ICD-10-CM

## 2019-05-09 DIAGNOSIS — U071 COVID-19: Secondary | ICD-10-CM

## 2019-05-09 DIAGNOSIS — M47816 Spondylosis without myelopathy or radiculopathy, lumbar region: Secondary | ICD-10-CM

## 2019-05-09 DIAGNOSIS — J9601 Acute respiratory failure with hypoxia: Secondary | ICD-10-CM | POA: Diagnosis not present

## 2019-05-09 DIAGNOSIS — N179 Acute kidney failure, unspecified: Secondary | ICD-10-CM

## 2019-05-09 DIAGNOSIS — J1289 Other viral pneumonia: Secondary | ICD-10-CM | POA: Diagnosis not present

## 2019-05-09 DIAGNOSIS — I82442 Acute embolism and thrombosis of left tibial vein: Secondary | ICD-10-CM

## 2019-05-09 DIAGNOSIS — I82452 Acute embolism and thrombosis of left peroneal vein: Secondary | ICD-10-CM

## 2019-05-09 DIAGNOSIS — R29818 Other symptoms and signs involving the nervous system: Secondary | ICD-10-CM

## 2019-05-09 DIAGNOSIS — D696 Thrombocytopenia, unspecified: Secondary | ICD-10-CM

## 2019-05-09 DIAGNOSIS — I69398 Other sequelae of cerebral infarction: Secondary | ICD-10-CM

## 2019-05-09 DIAGNOSIS — I2699 Other pulmonary embolism without acute cor pulmonale: Secondary | ICD-10-CM

## 2019-05-09 NOTE — Telephone Encounter (Signed)
Call to set up hospital follow up/TCM

## 2019-05-10 ENCOUNTER — Telehealth: Payer: Self-pay

## 2019-05-10 NOTE — Telephone Encounter (Signed)
Transition Care Management Follow-up Telephone Call  Date of discharge and from where: 05/07/19 [facility]   How have you been since you were released from the hospital? He has been ok he is still getting very winded with activity and is currently on oxygen and he is using a walker as well   Any questions or concerns? Just what dose he needs to be on for his coumadin and he want know if  Items Reviewed:  Did the pt receive and understand the discharge instructions provided? yes  Medications obtained and verified? yes  Any new allergies since your discharge? no  Dietary orders reviewed? Heart healthy diet   Do you have support at home? Yes   Other (ie: DME, Home Health, etc) home health  Functional Questionnaire: (I = Independent and D = Dependent) ADL's: I for the most part   Bathing/Dressing- I   Meal Prep- D  Eating- I  Maintaining continence- I  Transferring/Ambulation- I  Managing Meds- I   Follow up appointments reviewed:    PCP Hospital f/u appt confirmed? yes Scheduled to see [name] FNP-BC  on 05/14/19 @ 4:45PM  Specialist Hospital f/u appt confirmed? Has to schedule to see pulmonologist and nuerologist  Are transportation arrangements needed? no  If their condition worsens, is the pt aware to call  their PCP or go to the ED? yes  Was the patient provided with contact information for the PCP's office or ED? Yes   Was the pt encouraged to call back with questions or concerns? yes

## 2019-05-10 NOTE — Telephone Encounter (Signed)
Denise from [facility] called stated patient's INR is 3.7 and she wanted to know what are we wanting to adjust his med to. [phone]  I RETURNED HER AND ALSO CALLED THE PT AND ADVISED THEM HE IS TO HOLD ON THE MED TOMORROW AND THEN START TAKING IT Saturday-Wednesday and he is to have his INR rechecked on Wednesday and give [place] a call. YRL,RMA

## 2019-05-14 ENCOUNTER — Other Ambulatory Visit: Payer: Self-pay

## 2019-05-14 ENCOUNTER — Encounter: Payer: Self-pay | Admitting: Nurse Practitioner

## 2019-05-14 ENCOUNTER — Telehealth (INDEPENDENT_AMBULATORY_CARE_PROVIDER_SITE_OTHER): Payer: PRIVATE HEALTH INSURANCE | Admitting: Nurse Practitioner

## 2019-05-14 VITALS — BP 132/82 | HR 96

## 2019-05-14 DIAGNOSIS — Z9981 Dependence on supplemental oxygen: Secondary | ICD-10-CM

## 2019-05-14 DIAGNOSIS — U071 COVID-19: Secondary | ICD-10-CM | POA: Diagnosis not present

## 2019-05-14 DIAGNOSIS — Z8673 Personal history of transient ischemic attack (TIA), and cerebral infarction without residual deficits: Secondary | ICD-10-CM

## 2019-05-14 DIAGNOSIS — I63512 Cerebral infarction due to unspecified occlusion or stenosis of left middle cerebral artery: Secondary | ICD-10-CM

## 2019-05-14 DIAGNOSIS — J1289 Other viral pneumonia: Secondary | ICD-10-CM | POA: Diagnosis not present

## 2019-05-14 DIAGNOSIS — R531 Weakness: Secondary | ICD-10-CM

## 2019-05-14 DIAGNOSIS — I2699 Other pulmonary embolism without acute cor pulmonale: Secondary | ICD-10-CM

## 2019-05-14 DIAGNOSIS — J9601 Acute respiratory failure with hypoxia: Secondary | ICD-10-CM | POA: Diagnosis not present

## 2019-05-14 DIAGNOSIS — J181 Lobar pneumonia, unspecified organism: Secondary | ICD-10-CM | POA: Diagnosis not present

## 2019-05-14 DIAGNOSIS — J1282 Pneumonia due to coronavirus disease 2019: Secondary | ICD-10-CM

## 2019-05-14 NOTE — Progress Notes (Signed)
Virtual Visit via Video   This visit type was conducted due to national recommendations for restrictions regarding the COVID-19 Pandemic (e.g. social distancing) in an effort to limit this patient's exposure and mitigate transmission in our community.  Due to his co-morbid illnesses, this patient is at least at moderate risk for complications without adequate follow up.  This format is felt to be most appropriate for this patient at this time.  All issues noted in this document were discussed and addressed.  A limited physical exam was performed with this format.    This visit type was conducted due to national recommendations for restrictions regarding the COVID-19 Pandemic (e.g. social distancing) in an effort to limit this patient's exposure and mitigate transmission in our community.  Patients identity confirmed using two different identifiers.  This format is felt to be most appropriate for this patient at this time.  All issues noted in this document were discussed and addressed.  No physical exam was performed (except for noted visual exam findings with Video Visits).    Date:  05/24/2019   ID:  [name], [name] 1956-04-24, MRN [id number]  Patient Location:  Home - spoke with [name]  Provider location:   Office   Chief Complaint:  Hospital follow up for covid   History of Present Illness:    [name] is a 63 y.o. male who presents via video conferencing for a telehealth visit today.    The patient does have symptoms concerning for COVID-19 infection (fever, chills, cough, or new shortness of breath).   Virtual visit for hospital admission follow up for coronavirus 11/15-12/14 with COVID pneumonia complicated by a DVT, acute PE, thrombocytopenia and superimposed bacterial pneumonia and is now on coumadin, he was then transferred to [facility] on 12/9 for an acute stroke. He had a multifocal acute ischemia within the left MCA territory with the largest area at the base of  the left central sulcus without hemorrhage. He had right hand weakness.  He remains on 3 l oxygen and 10 mg prednisone has been completed.  He has Gread 1 dCHF.    He had a LLE DVT and PE likely related to hypercoagulable state (due to [place]).  His Hgba1c is up to 6.8 thought to be related to the prednisone.  His Transcranial doppler with bubble study was negative and he is to follow up with Dr. Leonie [name].  Hematology wants him to take coumadin instead of Xarelto and to continue aspirin.  His creatinine levels increased to 1.46 but is now down to 0.89. he had a gout flare of 1st left MTP which improved with prednisone.   His diovan/hctz was also held in the setting of acute CVA and has not had an elevated blood pressure.   [name] a 63 year old male with a right BKA, hypertension, hyperlipidemia, diabetes mellitus type 2 who was admitted to [facility] for COVID pneumonia on 11/15. He has been transferred to [facility] as of 12/9 for an acute CVA. His COVID pneumonia was complicated by a DVT, acute PE, thrombocytopenia and superimposed bacterial pneumonia and he has been receiving Xarelto.  His neurological symptoms include right hand weakness. An MRI obtained on 12/8 revealed >Multifocal acute ischemia within the left MCA territory, with largest area at the base of the left central sulcus. No hemorrhage or mass effect- Single punctate focus of acute ischemia within the anterior right parietal white matter.  Since discharge He was discharged 1 week ago Monday, has been having  shortness of breath.  Having to take breaks. He is on 3.5 - 4 liters of oxygen. Clot in leg was worse so changed from Xarelto to coumadin.  He skipped Friday due to INR 3.5.  Currently on 4 mg daily.   His neurological symptoms include right hand weakness.  His fine motor is getting better, picking up a piece of paper and writing he is having to concentrate on his writing.  Otherwise everything has been good, feels better.  He has started  PT on Tuesday - have been there twice.  OT was there today - exercises he was doing is what would be recommended.  Later on would want to come back and work with him with getting in the shower - focus on arm strength. Nurse once a week Wed or [name] she will recheck his INR.      Past Medical History:  Diagnosis Date  . Diabetes mellitus without complication ([place])   . GERD (gastroesophageal reflux disease)   . Hx of right BKA ([place])    uses prosthetic   . Hyperlipidemia   . Hypertension   . Liver disorder    tx with ursodiol    Past Surgical History:  Procedure Laterality Date  . BELOW KNEE LEG AMPUTATION Right   . CHOLECYSTECTOMY    . WISDOM TOOTH EXTRACTION       Current Meds  Medication Sig  . acetaminophen (TYLENOL) 500 MG tablet Take 500 mg by mouth every 6 (six) hours as needed for mild pain or fever.   Marland Kitchen aspirin 81 MG chewable tablet Chew 81 mg by mouth daily.  Marland Kitchen atorvastatin (LIPITOR) 40 MG tablet Take 1 tablet (40 mg total) by mouth daily at 6 PM.  . famotidine (PEPCID) 20 MG tablet Take 20 mg by mouth 2 (two) times daily.  . polyethylene glycol (MIRALAX / GLYCOLAX) 17 g packet Take 17 g by mouth daily as needed for mild constipation.  . tamsulosin (FLOMAX) 0.4 MG CAPS capsule Take 1 capsule (0.4 mg total) by mouth daily after supper.  . ursodiol (ACTIGALL) 250 MG tablet Take 250 mg by mouth 2 (two) times daily.  . [DISCONTINUED] warfarin (COUMADIN) 2 MG tablet Take 2 tablets (4 mg total) by mouth daily. Take 4 mg daily- dose to be adjusted after INR check     Allergies:   Patient has no known allergies.   Social History   Tobacco Use  . Smoking status: Never Smoker  . Smokeless tobacco: Never Used  Substance Use Topics  . Alcohol use: No  . Drug use: No     Family Hx: The patient's family history includes Colon polyps in his father. There is no history of Colon cancer, Rectal cancer, or Stomach cancer.  ROS:   Please see the history of present illness.      Review of Systems  Constitutional: Negative.   Respiratory: Positive for cough and shortness of breath. Negative for wheezing.        Wearing oxygen  Cardiovascular: Negative.   Neurological: Negative.   Psychiatric/Behavioral: Negative.     All other systems reviewed and are negative.   Labs/Other Tests and Data Reviewed:    Recent Labs: 12/28/2018: TSH 1.050 04/27/2019: B Natriuretic Peptide 59.9 05/02/2019: ALT 80; BUN 19; Creatinine, Ser 0.89; Magnesium 2.2; Potassium 4.8; Sodium 136 05/07/2019: Hemoglobin 12.9; Platelets 241   Recent Lipid Panel Lab Results  Component Value Date/Time   CHOL 109 05/03/2019 04:32 AM   CHOL 189 12/28/2018 02:03 PM  TRIG 46 05/03/2019 04:32 AM   HDL 46 05/03/2019 04:32 AM   HDL 40 12/28/2018 02:03 PM   CHOLHDL 2.4 05/03/2019 04:32 AM   LDLCALC 54 05/03/2019 04:32 AM   LDLCALC 122 (H) 12/28/2018 02:03 PM    Wt Readings from Last 3 Encounters:  05/06/19 203 lb 0.7 oz (92.1 kg)  12/28/18 231 lb (104.8 kg)  03/30/18 233 lb 6.4 oz (105.9 kg)     Exam:    Vital Signs:  BP 132/82 (BP Location: Left Arm, Patient Position: Sitting, Cuff Size: Large)   Pulse 96     Physical Exam  Constitutional: He is oriented to person, place, and time and well-developed, well-nourished, and in no distress. No distress.  Pulmonary/Chest: He is in respiratory distress (at times during conversation had to take a deep breath, wearing supplemental oxygen).  Neurological: He is alert and oriented to person, place, and time.  Psychiatric: Memory, affect and judgment normal.    ASSESSMENT & PLAN:   1. Lobar pneumonia ([place]) TCM Performed. A member of the clinical team spoke with the patient upon dischare. Discharge summary was reviewed in full detail during the visit. Meds reconciled and compared to discharge meds. Medication list is updated and reviewed with the patient.  Greater than 50% face to face time was spent in counseling an coordination of care.  All  questions were answered to the satisfaction of the patient.   Since being home he is doing well, treated with remdisivir, steroids and oxygen therapy - Ambulatory referral to Pulmonology  2. Acute respiratory failure with hypoxia (HCC)  Continues on oxygen therapy 3-4 l/m via [place]  Continue with PT as tolerated - Ambulatory referral to Pulmonology  3. Acute ischemic left MCA stroke ([place])  Occurred during hospitalization, currently on coumadin for the DVT and PE  Will send referral to neurology  Feels his weakness on right side is improving - Ambulatory referral to Neurology  4. Pneumonia due to COVID-19 virus  Treated with remdisivir and steroids  Intermittent coughing - Ambulatory referral to Pulmonology - Ambulatory referral to Neurology  5. Bilateral pulmonary embolism (HCC)  Continue coumadin will have a INR done on Wed or Thur by home health nurse  COVID-19 Education: The signs and symptoms of COVID-19 were discussed with the patient and how to seek care for testing (follow up with PCP or arrange E-visit).  The importance of social distancing was discussed today.  Patient Risk:   After full review of this patients clinical status, I feel that they are at least moderate risk at this time.  Time:   Today, I have spent 25 minutes/ seconds with the patient with telehealth technology discussing above diagnoses.     Medication Adjustments/Labs and Tests Ordered: Current medicines are reviewed at length with the patient today.  Concerns regarding medicines are outlined above.   Tests Ordered: Orders Placed This Encounter  Procedures  . Ambulatory referral to Pulmonology  . Ambulatory referral to Neurology    Medication Changes: No orders of the defined types were placed in this encounter.   Disposition:  Follow up in 2 month(s)  Signed, [name], FNP

## 2019-05-16 ENCOUNTER — Telehealth: Payer: Self-pay

## 2019-05-16 NOTE — Telephone Encounter (Signed)
I notified patient that we have received his short term disability forms and that ms.moore is out of the office until next week so there will be a little bit of a delayed on them. Pt understood. YRL,RMA

## 2019-05-16 NOTE — Telephone Encounter (Signed)
Nurse from [facility] called to inform provider that pt INR is 2.5

## 2019-05-21 ENCOUNTER — Other Ambulatory Visit: Payer: Self-pay

## 2019-05-21 ENCOUNTER — Telehealth: Payer: Self-pay

## 2019-05-21 MED ORDER — WARFARIN SODIUM 2 MG PO TABS: 4.0000 mg | ORAL_TABLET | Freq: Every day | ORAL | 2 refills | Status: DC

## 2019-05-21 MED ORDER — WARFARIN SODIUM 2 MG PO TABS: 4.0000 mg | ORAL_TABLET | Freq: Every day | ORAL | 0 refills | Status: DC

## 2019-05-21 MED ORDER — FLUTICASONE PROPIONATE 50 MCG/ACT NA SUSP: 1.0000 | Freq: Two times a day (BID) | NASAL | 1 refills | Status: DC

## 2019-05-21 NOTE — Telephone Encounter (Signed)
Patient called wanting to know if he should continue taking his current dose of coumadin and if so he needs a refill on it and flonase nasal spray. I returned his call and advised him to continue his current dose and have his INR rechecked tomorrow per Dr.Sanders. I have also reached out to the nurse to see If she could go and check his INR pt stated she usually goes over there on wednesdays or thursdays. YRL,RMA

## 2019-05-21 NOTE — Progress Notes (Signed)
flo

## 2019-05-23 ENCOUNTER — Telehealth: Payer: Self-pay | Admitting: Hematology & Oncology

## 2019-05-23 NOTE — Telephone Encounter (Signed)
lmom to inform patient of new patient appt 05/31/19 at [facility] per 12/30 staff msg

## 2019-05-24 ENCOUNTER — Other Ambulatory Visit: Payer: Self-pay

## 2019-05-24 ENCOUNTER — Encounter: Payer: Self-pay | Admitting: Nurse Practitioner

## 2019-05-24 DIAGNOSIS — I2699 Other pulmonary embolism without acute cor pulmonale: Secondary | ICD-10-CM

## 2019-05-24 MED ORDER — WARFARIN SODIUM 1 MG PO TABS: 1.0000 mg | ORAL_TABLET | Freq: Every day | ORAL | 11 refills | Status: DC

## 2019-05-28 ENCOUNTER — Encounter: Payer: Self-pay | Admitting: Hematology & Oncology

## 2019-05-28 ENCOUNTER — Encounter: Payer: Self-pay | Admitting: Nurse Practitioner

## 2019-05-30 ENCOUNTER — Telehealth: Payer: Self-pay

## 2019-05-30 NOTE — Telephone Encounter (Signed)
I returned a call to [facility] and spoke with the nurse Langley Gauss and told her that Laurance Flatten, NP said to keep the pt's coumadin regimen the same because the pt's INR is 2.5 today. The RN Langley Gauss said that she advised the pt to eat green leafy foods every other day and that the pt ate some leafy greens yesterday.

## 2019-05-31 ENCOUNTER — Inpatient Hospital Stay: Payer: PRIVATE HEALTH INSURANCE

## 2019-05-31 ENCOUNTER — Encounter: Payer: Self-pay | Admitting: Nurse Practitioner

## 2019-05-31 ENCOUNTER — Other Ambulatory Visit: Payer: Self-pay

## 2019-05-31 ENCOUNTER — Ambulatory Visit: Payer: PRIVATE HEALTH INSURANCE | Admitting: Hematology & Oncology

## 2019-05-31 MED ORDER — BLOOD GLUCOSE MONITOR KIT: PACK | 0 refills | Status: AC

## 2019-05-31 MED ORDER — ALCOHOL WIPES 70 % PADS: MEDICATED_PAD | 0 refills | Status: AC

## 2019-06-06 ENCOUNTER — Encounter: Payer: Self-pay | Admitting: Nurse Practitioner

## 2019-06-07 ENCOUNTER — Other Ambulatory Visit: Payer: Self-pay | Admitting: Nurse Practitioner

## 2019-06-07 MED ORDER — TAMSULOSIN HCL 0.4 MG PO CAPS: 0.4000 mg | ORAL_CAPSULE | Freq: Every day | ORAL | 0 refills | Status: DC

## 2019-06-13 ENCOUNTER — Encounter: Payer: Self-pay | Admitting: Nurse Practitioner

## 2019-06-21 ENCOUNTER — Encounter: Payer: Self-pay | Admitting: Nurse Practitioner

## 2019-06-22 ENCOUNTER — Other Ambulatory Visit: Payer: Self-pay | Admitting: Internal Medicine

## 2019-06-22 DIAGNOSIS — E782 Mixed hyperlipidemia: Secondary | ICD-10-CM

## 2019-06-25 ENCOUNTER — Ambulatory Visit: Payer: PRIVATE HEALTH INSURANCE | Admitting: Nurse Practitioner

## 2019-06-25 ENCOUNTER — Encounter: Payer: Self-pay | Admitting: Nurse Practitioner

## 2019-06-25 ENCOUNTER — Other Ambulatory Visit: Payer: Self-pay

## 2019-06-25 VITALS — BP 122/82 | HR 84 | Temp 98.2°F | Ht 66.8 in | Wt 214.6 lb

## 2019-06-25 DIAGNOSIS — R7309 Other abnormal glucose: Secondary | ICD-10-CM | POA: Diagnosis not present

## 2019-06-25 DIAGNOSIS — I10 Essential (primary) hypertension: Secondary | ICD-10-CM

## 2019-06-25 DIAGNOSIS — E782 Mixed hyperlipidemia: Secondary | ICD-10-CM | POA: Diagnosis not present

## 2019-06-25 DIAGNOSIS — Z8616 Personal history of COVID-19: Secondary | ICD-10-CM

## 2019-06-25 DIAGNOSIS — Z89619 Acquired absence of unspecified leg above knee: Secondary | ICD-10-CM

## 2019-06-25 DIAGNOSIS — Z89511 Acquired absence of right leg below knee: Secondary | ICD-10-CM | POA: Insufficient documentation

## 2019-06-25 DIAGNOSIS — Z89611 Acquired absence of right leg above knee: Secondary | ICD-10-CM

## 2019-06-25 DIAGNOSIS — M79675 Pain in left toe(s): Secondary | ICD-10-CM

## 2019-06-25 DIAGNOSIS — Z9981 Dependence on supplemental oxygen: Secondary | ICD-10-CM

## 2019-06-25 DIAGNOSIS — I2699 Other pulmonary embolism without acute cor pulmonale: Secondary | ICD-10-CM

## 2019-06-25 MED ORDER — TRIAMCINOLONE ACETONIDE 40 MG/ML IJ SUSP
40.0000 mg | Freq: Once | INTRAMUSCULAR | Status: AC
  Administered 2019-06-25: 40 mg via INTRAMUSCULAR

## 2019-06-25 NOTE — Progress Notes (Signed)
This visit occurred during the SARS-CoV-2 public health emergency.  Safety protocols were in place, including screening questions prior to the visit, additional usage of staff PPE, and extensive cleaning of exam room while observing appropriate contact time as indicated for disinfecting solutions.  Subjective:     Patient ID: [name] , male    DOB: 29-Mar-1956 , 64 y.o.   MRN: [id number]   Chief Complaint  Patient presents with  . Hypertension    HPI  He is here today for his follow up of chronic health problems  He has decreased his oxygen to 2 l/m, when he walks he titrates to 2 l/m.  At rehab, did high steps dropped to 80%. Left lower xtremity with swelling at times, wearing compression socks with improvement.     Question about covid vaccine, would like to know when he can get the vaccine. And he would like a refill on his  atorvastatin  He feels he is doing much better.  He has home PT.  Now he is able to walk through the house and go without oxygen while sitting still 90-93%, HR at rest now down in the 80's.    He reports during his hospitalization he had swelling to his lower extremity and had percocet and had the clot.  Since being home has pain with changes in the weather to his left foot ball area.  Remembers it being warm at one point.  Now having persistent pain  Hypertension This is a chronic problem. The current episode started more than 1 year ago. The problem has been gradually improving since onset. The problem is controlled. Associated symptoms include shortness of breath (with exertion). Pertinent negatives include no anxiety, chest pain, headaches or palpitations. There are no associated agents to hypertension. Past treatments include angiotensin blockers and diuretics. The current treatment provides significant improvement. There are no compliance problems.  There is no history of angina. There is no history of chronic renal disease.  Toe Pain  The incident  occurred more than 1 week ago. There was no injury mechanism. The pain is present in the left toes. The quality of the pain is described as aching. The pain is at a severity of 4/10. The pain has been constant since onset. Nothing (weather change) aggravates the symptoms. Treatments tried: using voltaren gel and tylenol.       Past Medical History:  Diagnosis Date  . Diabetes mellitus without complication ([place])   . GERD (gastroesophageal reflux disease)   . 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 Hyperlipidemia   . Hypertension   . Liver disorder    tx with ursodiol      Family History  Problem Relation Age of Onset  . Colon polyps Father   . Colon cancer Neg Hx   . Rectal cancer Neg Hx   . Stomach cancer Neg Hx      Current Outpatient Medications:  .  acetaminophen (TYLENOL) 500 MG tablet, Take 500 mg by mouth every 6 (six) hours as needed for mild pain or fever. , Disp: , Rfl:  .  Alcohol Swabs (ALCOHOL WIPES) 70 % PADS, Use to wipe skin to check CBG, Disp: 100 each, Rfl: 0 .  aspirin 81 MG chewable tablet, Chew 81 mg by mouth daily., Disp: , Rfl:  .  atorvastatin (LIPITOR) 40 MG tablet, Take 1 tablet (40 mg total) by mouth daily at 6 PM., Disp: 30 tablet, Rfl: 0 .  blood glucose meter kit and supplies KIT, Dispense based on patient and insurance preference. Use up to four times daily as directed. (FOR ICD-9 250.00, 250.01)., Disp: 1 each, Rfl: 0 .  famotidine (PEPCID) 20 MG tablet, Take 20 mg by mouth 2 (two) times daily., Disp: , Rfl:  .  fluticasone (FLONASE) 50 MCG/ACT nasal spray, Place 1 spray into both nostrils 2 (two) times daily at 10 AM and 5 PM., Disp: 16 g, Rfl: 1 .  Omega-3 Fatty Acids (FISH OIL PO), Take by mouth daily., Disp: , Rfl:  .  polyethylene glycol (MIRALAX / GLYCOLAX) 17 g packet, Take 17 g by mouth daily as needed for mild constipation., Disp: 14 each, Rfl: 0 .  tamsulosin (FLOMAX) 0.4 MG CAPS capsule, Take 1 capsule (0.4 mg total) by mouth daily  after supper., Disp: 30 capsule, Rfl: 0 .  ursodiol (ACTIGALL) 250 MG tablet, Take 250 mg by mouth 2 (two) times daily., Disp: , Rfl:  .  warfarin (COUMADIN) 1 MG tablet, Take 1 tablet (1 mg total) by mouth daily., Disp: 30 tablet, Rfl: 11 .  warfarin (COUMADIN) 2 MG tablet, Take 2 tablets (4 mg total) by mouth daily. Take 4 mg daily- dose to be adjusted after INR check, Disp: 4 tablet, Rfl: 0 .  VITAMIN D PO, Take 5,000 Units by mouth daily., Disp: , Rfl:    No Known Allergies   Review of Systems  Constitutional: Negative.   Respiratory: Positive for shortness of breath (with exertion). Negative for cough.   Cardiovascular: Negative.  Negative for chest pain, palpitations and leg swelling.  Neurological: Negative for dizziness and headaches.  Psychiatric/Behavioral: Negative.      Today's Vitals   06/25/19 1117  BP: 122/82  Pulse: 84  Temp: 98.2 F (36.8 C)  TempSrc: Oral  SpO2: 98%  Weight: 214 lb 9.6 oz (97.3 kg)  Height: 5' 6.8" (1.697 m)  PainSc: 4   PainLoc: Toe   Body mass index is 33.81 kg/m.   Objective:  Physical Exam Constitutional:      Appearance: Normal appearance.  Cardiovascular:     Rate and Rhythm: Normal rate and regular rhythm.     Pulses: Normal pulses.     Heart sounds: Normal heart sounds. No murmur.  Pulmonary:     Effort: Pulmonary effort is normal. No respiratory distress.     Breath sounds: Normal breath sounds.     Comments: Wearing oxygen at 2 l/m Musculoskeletal:     Comments: Right AKA  Skin:    General: Skin is warm.     Capillary Refill: Capillary refill takes less than 2 seconds.  Neurological:     General: No focal deficit present.     Mental Status: He is alert and oriented to person, place, and time.  Psychiatric:        Mood and Affect: Mood normal.        Behavior: Behavior normal.        Thought Content: Thought content normal.        Judgment: Judgment normal.         Assessment And Plan:     1. History of  amputation of lower extremity ([place])   2. Great toe pain, left  Likely inflammation will give kenalog IM - triamcinolone acetonide (KENALOG-40) injection 40 mg  3. History of COVID-19  Improving slowly  He is to go to Pulmonology in the morning  He is advised to make me aware of how long he  needs to be out of work - CBC  4. Elevated glucose  Chronic, controlled  No current medications  Encouraged to limit intake of sugary foods and drinks  Encouraged to increase physical activity to 150 minutes per week as tolerated - Hemoglobin A1c  5. Mixed hyperlipidemia  Chronic, controlled  Continue with current medications - Lipid panel  6. Essential hypertension  Chronic, excellent control  Continue current medications - CMP14+EGFR - CBC  7. Bilateral pulmonary embolism (HCC)  Currently on coumadin, awaiting mDINR to contact patient for home INR will also start process at coumadin clinic as back up  [name], [place] DISTANCING DUE TO THE COVID-19 PANDEMIC.

## 2019-06-26 ENCOUNTER — Encounter: Payer: Self-pay | Admitting: Nurse Practitioner

## 2019-06-26 ENCOUNTER — Encounter: Payer: Self-pay | Admitting: Internal Medicine

## 2019-06-26 ENCOUNTER — Ambulatory Visit (INDEPENDENT_AMBULATORY_CARE_PROVIDER_SITE_OTHER)
Admission: RE | Admit: 2019-06-26 | Discharge: 2019-06-26 | Disposition: A | Discharge status: Home / Self Care | Payer: PRIVATE HEALTH INSURANCE | Source: Ambulatory Visit | Attending: Internal Medicine | Admitting: Internal Medicine

## 2019-06-26 ENCOUNTER — Ambulatory Visit: Payer: PRIVATE HEALTH INSURANCE | Admitting: Internal Medicine

## 2019-06-26 VITALS — BP 130/82 | HR 80 | Ht 65.5 in | Wt 212.6 lb

## 2019-06-26 DIAGNOSIS — R0600 Dyspnea, unspecified: Secondary | ICD-10-CM | POA: Diagnosis not present

## 2019-06-26 DIAGNOSIS — Z8669 Personal history of other diseases of the nervous system and sense organs: Secondary | ICD-10-CM

## 2019-06-26 DIAGNOSIS — Z8616 Personal history of COVID-19: Secondary | ICD-10-CM

## 2019-06-26 DIAGNOSIS — J9611 Chronic respiratory failure with hypoxia: Secondary | ICD-10-CM | POA: Diagnosis not present

## 2019-06-26 LAB — CMP14+EGFR
ALT: 15 IU/L (ref 0–44)
AST: 25 IU/L (ref 0–40)
Albumin/Globulin Ratio: 1.3 (ref 1.2–2.2)
Albumin: 3.9 g/dL (ref 3.8–4.8)
Alkaline Phosphatase: 66 IU/L (ref 39–117)
BUN/Creatinine Ratio: 10 (ref 10–24)
BUN: 9 mg/dL (ref 8–27)
Bilirubin Total: 0.4 mg/dL (ref 0.0–1.2)
CO2: 23 mmol/L (ref 20–29)
Calcium: 9.1 mg/dL (ref 8.6–10.2)
Chloride: 104 mmol/L (ref 96–106)
Creatinine, Ser: 0.89 mg/dL (ref 0.76–1.27)
GFR calc Af Amer: 105 mL/min/{1.73_m2} (ref >59)
GFR calc non Af Amer: 91 mL/min/{1.73_m2} (ref >59)
Globulin, Total: 2.9 g/dL (ref 1.5–4.5)
Glucose: 101 mg/dL — ABNORMAL HIGH (ref 65–99)
Potassium: 4.3 mmol/L (ref 3.5–5.2)
Sodium: 143 mmol/L (ref 134–144)
Total Protein: 6.8 g/dL (ref 6.0–8.5)

## 2019-06-26 LAB — CBC
Hematocrit: 43.1 % (ref 37.5–51.0)
Hemoglobin: 14.5 g/dL (ref 13.0–17.7)
MCH: 30.6 pg (ref 26.6–33.0)
MCHC: 33.6 g/dL (ref 31.5–35.7)
MCV: 91 fL (ref 79–97)
Platelets: 224 10*3/uL (ref 150–450)
RBC: 4.74 x10E6/uL (ref 4.14–5.80)
RDW: 13.8 % (ref 11.6–15.4)
WBC: 6.6 10*3/uL (ref 3.4–10.8)

## 2019-06-26 LAB — LIPID PANEL
Chol/HDL Ratio: 3.1 ratio (ref 0.0–5.0)
Cholesterol, Total: 117 mg/dL (ref 100–199)
HDL: 38 mg/dL — ABNORMAL LOW (ref >39)
LDL Chol Calc (NIH): 52 mg/dL (ref 0–99)
Triglycerides: 158 mg/dL — ABNORMAL HIGH (ref 0–149)
VLDL Cholesterol Cal: 27 mg/dL (ref 5–40)

## 2019-06-26 LAB — HEMOGLOBIN A1C
Est. average glucose Bld gHb Est-mCnc: 137 mg/dL
Hgb A1c MFr Bld: 6.4 % — ABNORMAL HIGH (ref 4.8–5.6)

## 2019-06-26 LAB — SEDIMENTATION RATE: Sed Rate: 37 mm/hr — ABNORMAL HIGH (ref 0–20)

## 2019-06-26 LAB — BRAIN NATRIURETIC PEPTIDE: Pro B Natriuretic peptide (BNP): 22 pg/mL (ref 0.0–100.0)

## 2019-06-26 NOTE — Patient Instructions (Addendum)
ICD-10-CM   1. History of 2019 novel coronavirus disease (COVID-19)  Z86.16   2. Chronic respiratory failure with hypoxia (HCC)  J96.11   3. History of sleep apnea  Z86.69    You have covid long haul  The best management plan will involve patience with grit, trying to improve the things that we can control which would include weight loss and improved control of sleep apnea and physical therapy for fatigue  Plan Continue physical therapy  Continue oxygen based on pulse ox monitoring at home  Do blood ESR, d-dimer, BNP, ANA, ACE, rheumatoid factor, CCP, double-stranded DNA, PR-3, MPO, GBM antibodies  Do CXR 2 view -   Do Echo   If ESR high can consider a 2-3 week course prednisone  If the ESR is normal and the chest x-ray is not giving [place] any clarity we might have to get a high-resolution CT chest  Remain on short term disability through end of May 2021  Encourage weight loss and physical therapy  Refer to one of our sleep specialist to reestablish her care for sleep apnea  Followup 1-2 weeks with me or an app tele visit to review results 2-3 months - revisit ILD symptom score, walk test - with Dr Chase Caller - regular clinic Refer to one of our sleep doc

## 2019-06-26 NOTE — Addendum Note (Signed)
Addended by: Lorretta Harp on: 06/26/2019 10:35 AM   Modules accepted: Orders

## 2019-06-26 NOTE — Progress Notes (Signed)
OV 06/26/2019  Subjective:  Patient ID: [name], male , DOB: 1956/01/30 , age 64 y.o. , MRN: [id number] , ADDRESS: [place] [address]   06/26/2019 -   Chief Complaint  Patient presents with  . Consult    Referral due to pna due to covid-19. Pt was hospitalized 11/15-12/14.  Pt states each day is getting better. Pt does still have SOB with exertion. Denies any complaints of cough or CP. Pt is on 2L O2 which he was discharged on and wears it when doing activities and pt also wears it at night as needed.     HPI [name] 64 y.o. -  64 year old male with a right BKA, hypertension, hyperlipidemia, diabetes mellitus type 2 who was admitted to [facility] for COVID pneumonia on 11/15. He has been transferred to [facility] as of 12/9 for an acute CVA. His COVID pneumonia was complicated by a DVT, acute PE, thrombocytopenia and superimposed bacterial pneumonia and he has been receiving Xarelto.  His neurological symptoms include right hand weakness. An MRI obtained on 12/8 revealed >Multifocal acute ischemia within the left MCA territory, with largest area at the base of the left central sulcus. No hemorrhage or mass effect- Single punctate focus of acute ischemia within the anterior right parietal white matter -> discharged mid dec 2021   At discharge he was discharged on 3 L of oxygen and 10 mg of prednisone taper that lasted a few days.  He was also discharged on Xarelto and aspirin on account of his DVT and stroke.  Acute kidney injury resolved and his creatinine trended to baseline of 0.89 mg percent.  At this point in time he is on Coumadin and aspirin.  His primary care physician is managing the Coumadin.  Overall at this point in time he says he is better.  He says he can walk around the house desaturating to 90% on room air.  But when he walks outside he does desaturate to 83%.  He also has fatigue.  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  He is wondering about going back to work in a few months.  He says ideally he wants to work but without jeopardizing patient safety   He also says he has sleep apnea but does not use a CPAP.  SYMPTOM SCALE - ILD 06/26/2019   O2 use 2L at hime  Shortness of Breath 0 -> 5 scale with 5 being worst (score 6 If unable to do)  At rest 0  Simple tasks - showers, clothes change, eating, shaving 3  Household (dishes, doing bed, laundry) 4  Shopping 4  Walking level at own pace 4  Walking up Stairs 4  Total (30-36) Dyspnea Score 15  How bad is your cough? 0  How bad is your fatigue 3  How bad is nausea 0  How bad is vomiting?  0  How bad is diarrhea? 0  How bad is anxiety? 0  How bad is depression 0    Simple office walk 185 feet x  3 laps goal with forehead probe 06/26/2019   O2 used ra  Number laps completed Aim for 3 laps. Only did 1.5 laps and desats  Comments about pace Slow antalgic  Resting Pulse Ox/HR 100% and 75/min  Final Pulse Ox/HR 87% and 118/min  Desaturated </= 88% yes  Desaturated <= 3% points Yes, 13 points  Got Tachycardic >/=  90/min yes  Symptoms at end of test dyspneic  Miscellaneous comments x    He did have lab work with his PCP yesterday.  His creatinine is 0.89 mg percent.  This is normal.  His hemoglobin is 14.5 g% and is normal.  Platelets are normal.I do not see an INR but the last INR was mid December 2020 with INR of 2.0.  His hemoglobin A1c yesterday was 6.4 and pretty acceptable.  His last echo was May 03, 2019 with EF 65% and grade 1 diastolic dysfunction.   His last chest x-ray was April 27, 2019: That shows severe bilateral acute lung injury pattern that I personally visualized and agree with the interpretation and is also my own interpretation.  ROS - per HPI     has a past medical history of Diabetes mellitus without complication ([place]), GERD (gastroesophageal reflux disease), right BKA ([place]), Hyperlipidemia,  Hypertension, and Liver disorder.   reports that he has never smoked. He has never used smokeless tobacco.  Past Surgical History:  Procedure Laterality Date  . BELOW KNEE LEG AMPUTATION Right   . CHOLECYSTECTOMY    . WISDOM TOOTH EXTRACTION      No Known Allergies  Immunization History  Administered Date(s) Administered  . Influenza,inj,Quad PF,6+ Mos 03/11/2019    Family History  Problem Relation Age of Onset  . Colon polyps Father   . Colon cancer Neg Hx   . Rectal cancer Neg Hx   . Stomach cancer Neg Hx      Current Outpatient Medications:  .  acetaminophen (TYLENOL) 500 MG tablet, Take 500 mg by mouth every 6 (six) hours as needed for mild pain or fever. , Disp: , Rfl:  .  Alcohol Swabs (ALCOHOL WIPES) 70 % PADS, Use to wipe skin to check CBG, Disp: 100 each, Rfl: 0 .  aspirin 81 MG chewable tablet, Chew 81 mg by mouth daily., Disp: , Rfl:  .  atorvastatin (LIPITOR) 20 MG tablet, TAKE 2 TABLETS BY MOUTH AT BEDTIME., Disp: 90 tablet, Rfl: 0 .  blood glucose meter kit and supplies KIT, Dispense based on patient and insurance preference. Use up to four times daily as directed. (FOR ICD-9 250.00, 250.01)., Disp: 1 each, Rfl: 0 .  famotidine (PEPCID) 20 MG tablet, Take 20 mg by mouth 2 (two) times daily., Disp: , Rfl:  .  fluticasone (FLONASE) 50 MCG/ACT nasal spray, Place 1 spray into both nostrils 2 (two) times daily at 10 AM and 5 PM., Disp: 16 g, Rfl: 1 .  Omega-3 Fatty Acids (FISH OIL PO), Take by mouth daily., Disp: , Rfl:  .  polyethylene glycol (MIRALAX / GLYCOLAX) 17 g packet, Take 17 g by mouth daily as needed for mild constipation., Disp: 14 each, Rfl: 0 .  tamsulosin (FLOMAX) 0.4 MG CAPS capsule, Take 1 capsule (0.4 mg total) by mouth daily after supper., Disp: 30 capsule, Rfl: 0 .  ursodiol (ACTIGALL) 250 MG tablet, Take 250 mg by mouth 2 (two) times daily., Disp: , Rfl:  .  VITAMIN D PO, Take 5,000 Units by mouth daily., Disp: , Rfl:  .  warfarin (COUMADIN) 1 MG  tablet, Take 1 tablet (1 mg total) by mouth daily., Disp: 30 tablet, Rfl: 11 .  warfarin (COUMADIN) 2 MG tablet, Take 2 tablets (4 mg total) by mouth daily. Take 4 mg daily- dose to be adjusted after INR check, Disp: 4 tablet, Rfl: 0      Objective:   Vitals:   06/26/19 3888  BP: 130/82  Pulse: 80  SpO2: 100%  Weight: 212 lb 9.6 oz (96.4 kg)  Height: 5' 5.5" (1.664 m)   2L [place] Estimated body mass index is 34.84 kg/m as calculated from the following:   Height as of this encounter: 5' 5.5" (1.664 m).   Weight as of this encounter: 212 lb 9.6 oz (96.4 kg).  _0 @  [employer]   06/26/19 0903  Weight: 212 lb 9.6 oz (96.4 kg)     Physical Exam  General Appearance:    Alert, cooperative, no distress, appears stated age - yes , Deconditioned looking - no , OBESE  - yes, Sitting on Wheelchair -  no  Head:    Normocephalic, without obvious abnormality, atraumatic  Eyes:    PERRL, conjunctiva/corneas clear,  Ears:    Normal TM's and external ear canals, both ears  Nose:   Nares normal, septum midline, mucosa normal, no drainage    or sinus tenderness. OXYGEN ON  - yes . Patient is @ 2L   Throat:   Lips, mucosa, and tongue normal; teeth and gums normal. Cyanosis on lips - no  Neck:   Supple, symmetrical, trachea midline, no adenopathy;    thyroid:  no enlargement/tenderness/nodules; no carotid   bruit or JVD  Back:     Symmetric, no curvature, ROM normal, no CVA tenderness  Lungs:     Distress - no , Wheeze no, Barrell Chest - no, Purse lip breathing - no, Crackles - no   Chest Wall:    No tenderness or deformity.    Heart:    Regular rate and rhythm, S1 and S2 normal, no rub   or gallop, Murmur - no  Breast Exam:    NOT DONE  Abdomen:     Soft, non-tender, bowel sounds active all four quadrants,    no masses, no organomegaly. Visceral obesity - yes  Genitalia:   NOT DONE  Rectal:   NOT DONE  Extremities:   Extremities - normal though Rt side is amputtee Has Cane -  no, Clubbing - no, Edema - no  Pulses:   2+ and symmetric all extremities  Skin:   Stigmata of Connective Tissue Disease - no  Lymph nodes:   Cervical, supraclavicular, and axillary nodes normal  Psychiatric:  Neurologic:   Pleasant - yes, Anxious - no, Flat affect - no  CAm-ICU - neg, Alert and Oriented x 3 - yes, Moves all 4s - yes, Speech - normal, Cognition - intact           Assessment:       ICD-10-CM   1. History of 2019 novel coronavirus disease (COVID-19)  Z86.16   2. Chronic respiratory failure with hypoxia (HCC)  J96.11   3. History of sleep apnea  Z86.69        Plan:     Patient Instructions     ICD-10-CM   1. History of 2019 novel coronavirus disease (COVID-19)  Z86.16   2. Chronic respiratory failure with hypoxia (HCC)  J96.11   3. History of sleep apnea  Z86.69    You have covid long haul  The best management plan will involve patience with grit, trying to improve the things that we can control which would include weight loss and improved control of sleep apnea and physical therapy for fatigue  Plan Continue physical therapy  Continue oxygen based on pulse ox monitoring at home  Do blood ESR, d-dimer, BNP, ANA, ACE, rheumatoid factor, CCP, double-stranded DNA,  PR-3, MPO, GBM antibodies  Do CXR 2 view -   Do Echo   If ESR high can consider a 2-3 week course prednisone  If the ESR is normal and the chest x-ray is not giving [place] any clarity we might have to get a high-resolution CT chest  Remain on short term disability through end of May 2021  Encourage weight loss and physical therapy  Refer to one of our sleep specialist to reestablish her care for sleep apnea  Followup 1-2 weeks with me or an app tele visit to review results 2-3 months - revisit ILD symptom score, walk test - with Dr Chase Caller - regular clinic Refer to one of our sleep doc   SIGNATURE    Dr. Brand Males, M.D., F.C.C.P,  Pulmonary and Critical Care Medicine Staff  Physician, [place] Director - Interstitial Lung Disease  Program  [facility] [place], [place], [address]  Pager: [phone], If no answer or between  15:00h - 7:00h: call [phone] Telephone: [phone]  9:43 AM 06/26/2019

## 2019-06-27 ENCOUNTER — Telehealth: Payer: Self-pay

## 2019-06-27 ENCOUNTER — Other Ambulatory Visit: Payer: Self-pay

## 2019-06-27 ENCOUNTER — Ambulatory Visit: Payer: PRIVATE HEALTH INSURANCE | Admitting: Adult Health

## 2019-06-27 ENCOUNTER — Other Ambulatory Visit: Payer: Self-pay | Admitting: Nurse Practitioner

## 2019-06-27 ENCOUNTER — Encounter: Payer: Self-pay | Admitting: Adult Health

## 2019-06-27 VITALS — BP 126/86 | HR 78 | Temp 98.0°F | Ht 65.5 in | Wt 210.0 lb

## 2019-06-27 DIAGNOSIS — I63512 Cerebral infarction due to unspecified occlusion or stenosis of left middle cerebral artery: Secondary | ICD-10-CM | POA: Diagnosis not present

## 2019-06-27 DIAGNOSIS — E118 Type 2 diabetes mellitus with unspecified complications: Secondary | ICD-10-CM

## 2019-06-27 DIAGNOSIS — U071 COVID-19: Secondary | ICD-10-CM

## 2019-06-27 DIAGNOSIS — I10 Essential (primary) hypertension: Secondary | ICD-10-CM | POA: Diagnosis not present

## 2019-06-27 DIAGNOSIS — I2699 Other pulmonary embolism without acute cor pulmonale: Secondary | ICD-10-CM | POA: Insufficient documentation

## 2019-06-27 DIAGNOSIS — E785 Hyperlipidemia, unspecified: Secondary | ICD-10-CM

## 2019-06-27 DIAGNOSIS — I639 Cerebral infarction, unspecified: Secondary | ICD-10-CM

## 2019-06-27 DIAGNOSIS — I8290 Acute embolism and thrombosis of unspecified vein: Secondary | ICD-10-CM

## 2019-06-27 LAB — CYCLIC CITRUL PEPTIDE ANTIBODY, IGG: Cyclic Citrullin Peptide Ab: <16 UNITS

## 2019-06-27 LAB — ANA: Anti Nuclear Antibody (ANA): NEGATIVE

## 2019-06-27 LAB — ANGIOTENSIN CONVERTING ENZYME: Angiotensin-Converting Enzyme: 56 U/L (ref 9–67)

## 2019-06-27 LAB — RHEUMATOID FACTOR: Rheumatoid fact SerPl-aCnc: <14 IU/mL (ref <14)

## 2019-06-27 LAB — MPO/PR-3 (ANCA) ANTIBODIES
Myeloperoxidase Abs: <1 AI
Serine Protease 3: <1 AI

## 2019-06-27 LAB — ANTI-DNA ANTIBODY, DOUBLE-STRANDED: ds DNA Ab: <1 IU/mL

## 2019-06-27 LAB — GLOMERULAR BASEMENT MEMBRANE ANTIBODIES: GBM Ab: <1 AI

## 2019-06-27 LAB — D-DIMER, QUANTITATIVE: D-Dimer, Quant: 0.46 mcg/mL FEU (ref <0.50)

## 2019-06-27 NOTE — Progress Notes (Signed)
Improving cxr. Full lab work still pending - but for now d-dimer still high suggesting continued anticoagulation needed

## 2019-06-27 NOTE — Patient Instructions (Addendum)
Continue aspirin 81 mg daily and warfarin daily  and lipitor  for secondary stroke prevention  Continue to follow up with PCP regarding cholesterol, blood pressure and diabetes management as well as INR checks   continue to follow with pulmonology for ongoing monitoring or management  Get appointment scheduled with hematology   Continue to monitor blood pressure at home  Maintain strict control of hypertension with blood pressure goal below 130/90, diabetes with hemoglobin A1c goal below 6.5% and cholesterol with LDL cholesterol (bad cholesterol) goal below 70 mg/dL. I also advised the patient to eat a healthy diet with plenty of whole grains, cereals, fruits and vegetables, exercise regularly and maintain ideal body weight.  Followup in the future with me in 4 months or call earlier if needed       Thank you for coming to see [place] at [facility]. I hope we have been able to provide you high quality care today.  You may receive a patient satisfaction survey over the next few weeks. We would appreciate your feedback and comments so that we may continue to improve ourselves and the health of our patients.

## 2019-06-27 NOTE — Telephone Encounter (Signed)
This is normal, we are setting him up with the coumadin clinic in case he dose not receive the home INR kit by next week.

## 2019-06-27 NOTE — Progress Notes (Signed)
I agree with the above plan 

## 2019-06-27 NOTE — Telephone Encounter (Signed)
The nurse with [facility] called and said that the pt's inr is 2.8 today

## 2019-06-27 NOTE — Progress Notes (Signed)
[facility] [address] [place]. [place] [address] [phone]       HOSPITAL FOLLOW UP NOTE  [name]. [name] Date of Birth:  1956-02-01 Medical Record Number:  [id number]   Reason for Referral:  hospital stroke follow up    CHIEF COMPLAINT:  Chief Complaint  Patient presents with  . Follow-up    Tx RM. Alone. states he is doing well. Has mild weakness in right hand. A few changes in writing and typing.    HPI: [name] being seen today for in office hospital follow-up regarding subacute left MCA territory infarcts possibly from hypercoagulable state in setting of COVID-19 with PE and DVT despite being on anticoagulation with Xarelto on 05/02/2019.  History obtained from patient and chart review. Reviewed all radiology images and labs personally.  [name]. [name] is a 64 y.o. 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 12/9 for evaluation of a subacute L MCA territory infarct. He had new onset R hand weakness 1 week ago, that has improved. He is on xarelto, aspirin and statin.  Evaluated by stroke team and Dr. Leonie Man with recent left MCA infarcts possibly due to hypercoagulable state in setting of COVID-19 with PE and DVT despite being on anticoagulation with Xarelto.  CTA head/neck no significant large vessel stenosis or occlusion or arthrosclerotic disease.  Carotid Dopplers unremarkable.  TCD with bubble was negative.  2D echo showed an EF of 60 to 65% without cardiac source of embolus identified.  Lower extremity Doppler showed left DVT posterior tibial, left peroneal veins with new clot progression now extending to left peroneal with prior left posterior tib.  Felt LLE DVT and PE due to hypercoagulable state during Covid  infection and recommended considering change of anticoagulation from Xarelto to either full dose Lovenox) with Lovenox bridging with further recommendations by hematology.  Consult by hematology who recommended transition to Coumadin with heparin bridge along with continuation of aspirin 81 mg daily.  History of HTN stable.  LDL 54 and recommend continuation of atorvastatin 40 mg daily.  Controlled DM with A1c 6.8.  Other stroke risk factors include obesity and history of right BKA but no prior history of stroke.  Other active problems include AKI resolved and BPH on Flomax.  Residual deficits include diminished right fine finger movements and mild grip weakness.  Due to continued desats with ambulation, he was discharged home on home O2 along with continuation of prednisone taper and follow-up as recommended on 05/07/2019.   [name] is a 64 year old right-handed male who is being seen today for hospital follow up.  He has recovered greatly overall with minimal residual stroke deficits right hand mild decreased dexterity that is only evident while writing or typing.  He has been slowly returning to prior activities but has been limited due to respiratory status.  Continues to use O2 with ambulation but is not currently needed at rest.  He has not returned back to work at this time working as a Surveyor, minerals at [employer] currently receiving disability through pulmonology.  He is hopeful to return around 09/2019.  Recent follow-up with pulmonology yesterday and plans on having a follow-up appointment scheduled with hematology in the near future.  He has  continued on warfarin and aspirin without bleeding or bruising.  INR levels have been stable and continues to be monitored by PCP.  Continues on atorvastatin without myalgias.  Blood pressure today 126/86.  Denies new or worsening stroke/TIA symptoms.  No further concerns at this time.        ROS:   14 system review of systems performed and  negative with exception of decreased right hand dexterity, dyspnea on exertion  PMH:  Past Medical History:  Diagnosis Date  . Diabetes mellitus without complication ([place])   . GERD (gastroesophageal reflux disease)   . Hx of right BKA ([place])    uses prosthetic   . Hyperlipidemia   . Hypertension   . Liver disorder    tx with ursodiol     PSH:  Past Surgical History:  Procedure Laterality Date  . BELOW KNEE LEG AMPUTATION Right   . CHOLECYSTECTOMY    . WISDOM TOOTH EXTRACTION      Social History:  Social History   Socioeconomic History  . Marital status: Married    Spouse name: Not on file  . Number of children: 1  . Years of education: Not on file  . Highest education level: Not on file  Occupational History  . Not on file  Tobacco Use  . Smoking status: Never Smoker  . Smokeless tobacco: Never Used  Substance and Sexual Activity  . Alcohol use: No  . Drug use: No  . Sexual activity: Not on file  Other Topics Concern  . Not on file  Social History Narrative  . Not on file   Social Determinants of Health   Financial Resource Strain:   . Difficulty of Paying Living Expenses: Not on file  Food Insecurity:   . Worried About Charity fundraiser in the Last Year: Not on file  . Ran Out of Food in the Last Year: Not on file  Transportation Needs:   . Lack of Transportation (Medical): Not on file  . Lack of Transportation (Non-Medical): Not on file  Physical Activity:   . Days of Exercise per Week: Not on file  . Minutes of Exercise per Session: Not on file  Stress:   . Feeling of Stress : Not on file  Social Connections:   . Frequency of Communication with Friends and Family: Not on file  . Frequency of Social Gatherings with Friends and Family: Not on file  . Attends Religious Services: Not on file  . Active Member of Clubs or Organizations: Not on file  . Attends Archivist Meetings: Not on file  . Marital Status: Not on file  Intimate Partner  Violence:   . Fear of Current or Ex-Partner: Not on file  . Emotionally Abused: Not on file  . Physically Abused: Not on file  . Sexually Abused: Not on file    Family History:  Family History  Problem Relation Age of Onset  . Colon polyps Father   . Colon cancer Neg Hx   . Rectal cancer Neg Hx   . Stomach cancer Neg Hx     Medications:   Current Outpatient Medications on File Prior to Visit  Medication Sig Dispense Refill  . acetaminophen (TYLENOL) 500 MG tablet Take 500 mg by mouth every 6 (six) hours as needed for mild pain or fever.     . Alcohol Swabs (ALCOHOL WIPES) 70 % PADS Use to wipe skin to check CBG 100 each 0  . aspirin 81 MG chewable tablet  Chew 81 mg by mouth daily.    Marland Kitchen atorvastatin (LIPITOR) 20 MG tablet TAKE 2 TABLETS BY MOUTH AT BEDTIME. 90 tablet 0  . blood glucose meter kit and supplies KIT Dispense based on patient and insurance preference. Use up to four times daily as directed. (FOR ICD-9 250.00, 250.01). 1 each 0  . famotidine (PEPCID) 20 MG tablet Take 20 mg by mouth 2 (two) times daily.    . fluticasone (FLONASE) 50 MCG/ACT nasal spray Place 1 spray into both nostrils 2 (two) times daily at 10 AM and 5 PM. 16 g 1  . Omega-3 Fatty Acids (FISH OIL PO) Take by mouth daily.    . polyethylene glycol (MIRALAX / GLYCOLAX) 17 g packet Take 17 g by mouth daily as needed for mild constipation. 14 each 0  . tamsulosin (FLOMAX) 0.4 MG CAPS capsule Take 1 capsule (0.4 mg total) by mouth daily after supper. 30 capsule 0  . ursodiol (ACTIGALL) 250 MG tablet Take 250 mg by mouth 2 (two) times daily.    . valsartan-hydrochlorothiazide (DIOVAN HCT) 320-25 MG tablet     . VITAMIN D PO Take 5,000 Units by mouth daily.    Marland Kitchen warfarin (COUMADIN) 1 MG tablet Take 1 tablet (1 mg total) by mouth daily. 30 tablet 11  . warfarin (COUMADIN) 2 MG tablet Take 2 tablets (4 mg total) by mouth daily. Take 4 mg daily- dose to be adjusted after INR check 4 tablet 0   No current  facility-administered medications on file prior to visit.    Allergies:  No Known Allergies   Physical Exam  Vitals:   06/27/19 1014  BP: 126/86  Pulse: 78  Temp: 98 F (36.7 C)  Weight: 210 lb (95.3 kg)  Height: 5' 5.5" (1.664 m)   Body mass index is 34.41 kg/m. No exam data present  Depression screen [facility] 2/9 06/27/2019  Decreased Interest 0  Down, Depressed, Hopeless 0  PHQ - 2 Score 0     General: well developed, well nourished, very pleasant middle-aged African-American male, seated, in no evident distress Head: head normocephalic and atraumatic.   Neck: supple with no carotid or supraclavicular bruits Cardiovascular: regular rate and rhythm, no murmurs Musculoskeletal: no deformity; left BKA with prosthetic in place Skin:  no rash/petichiae Vascular:  Normal pulses all extremities   Neurologic Exam Mental Status: Awake and fully alert.   Normal speech and language.  Oriented to place and time. Recent and remote memory intact. Attention span, concentration and fund of knowledge appropriate. Mood and affect appropriate.  Cranial Nerves: Fundoscopic exam reveals sharp disc margins. Pupils equal, briskly reactive to light. Extraocular movements full without nystagmus. Visual fields full to confrontation. Hearing intact. Facial sensation intact. Face, tongue, palate moves normally and symmetrically.  Motor: Normal bulk and tone. Normal strength in all tested extremity muscles.  Possible very fine right hand motor deficit Sensory.: intact to touch , pinprick , position and vibratory sensation.  Coordination: Rapid alternating movements normal in all extremities except very mild right hand dexterity. Finger-to-nose and heel-to-shin performed accurately bilaterally. Gait and Station: Arises from chair without difficulty. Stance is normal. Gait demonstrates  slight abnormality due to left BKA but overall stable currently using prosthetic Reflexes: 1+ and symmetric. Toes downgoing.      NIHSS  0 Modified Rankin  2    Diagnostic Data (Labs, Imaging, Testing)  CT ANGIO HEAD W OR WO CONTRAST CT ANGIO NECK W OR WO CONTRAST 05/03/2019 IMPRESSION: No significant atherosclerotic  disease. No stenosis. No intracranial large or medium vessel occlusion. Bilateral infarctions shown by MRI could be due to micro embolic disease from the heart or could be due to hypercoagulable state with small-vessel infarctions.  [name] BRAIN WO CONTRAST 05/01/2019 IMPRESSION: 1. Multifocal acute ischemia within the left MCA territory, with largest area at the base of the left central sulcus. No hemorrhage or mass effect. 2. Single punctate focus of acute ischemia within the anterior right parietal white matter.  ECHOCARDIOGRAM 05/03/2019 IMPRESSIONS  1. Left ventricular ejection fraction, by visual estimation, is 60 to  65%. The left ventricle has normal function. There is no left ventricular  hypertrophy.  2. Left ventricular diastolic parameters are consistent with Grade I  diastolic dysfunction (impaired relaxation).  3. The left ventricle has no regional wall motion abnormalities.  4. Global right ventricle has normal systolic function.The right  ventricular size is normal.  5. Left atrial size was normal.  6. Right atrial size was normal.  7. The mitral valve is normal in structure. No evidence of mitral valve  regurgitation. No evidence of mitral stenosis.  8. The tricuspid valve is normal in structure. Tricuspid valve  regurgitation is not demonstrated.  9. The aortic valve is normal in structure. Aortic valve regurgitation is  mild. Mild aortic valve sclerosis without stenosis.  10. The pulmonic valve was normal in structure. Pulmonic valve  regurgitation is not visualized.  11. Normal LV systolic function; grade 1 diastolic dysfunction; mildly  sclerotic aortic valve with mild AI.   VAS [place] LOWER EXTREMITY VENOUS 05/03/2019 Summary:  Right: There is no  evidence of deep vein thrombosis in the lower  extremity.  Left: Findings consistent with acute deep vein thrombosis involving the left posterior tibial veins, and left peroneal veins. Findings suggest new clot progression as compared to previous examination. DVT remains in left posterior tibial veins and is now visualized in the left peroneal veins.        ASSESSMENT: [name] is a 64 y.o. year old male with hospitalization at [facility] for Covid pneumonia on 04/09/2019 with respiratory failure, PE, LLE DVT and possible bacterial pneumonia found to have new onset right hand weakness with evidence of left MCA territory infarct and was transferred to [facility] for further evaluation and management. L MCA stroke possibly from hypercoagulable state in setting of COVID 19 w/ PE and DVT despite being on anticoagulation with Xarelto.   Vascular risk factors include HTN, HLD, DM2, stage 1 CKD and right BKA.  Greatly recovered from a stroke standpoint with only minimal residual decreased right hand dexterity    PLAN:  1. L MCA stroke : Continue aspirin 81 mg daily and warfarin daily  and lipitor  for secondary stroke prevention. Maintain strict control of hypertension with blood pressure goal below 130/90, diabetes with hemoglobin A1c goal below 6.5% and cholesterol with LDL cholesterol (bad cholesterol) goal below 70 mg/dL.  I also advised the patient to eat a healthy diet with plenty of whole grains, cereals, fruits and vegetables, exercise regularly with at least 30 minutes of continuous activity daily and maintain ideal body weight. 2. HTN: Advised to continue current treatment regimen.  Today's BP stable.  Advised to continue to monitor at home along with continued follow-up with PCP for management 3. HLD: Advised to continue current treatment regimen along with continued follow-up with PCP for future prescribing and monitoring of lipid panel 4. DMII: Advised to continue to monitor glucose levels at home  along with continued  follow-up with PCP for management and monitoring 5. DVT and PE post Covid: Continuation of warfarin and ongoing follow-up with PCP for INR monitoring.  Advised to ensure schedules follow-up visit with hematology in the near future for recommended follow-up post hospitalization 6. Advised to continue to follow with pulmonology for ongoing monitoring and management for ongoing respiratory impairment post Covid with ongoing use of O2 via [place]    Follow up in 4 months or call earlier if needed   Greater than 50% of time during this 45 minute visit was spent on counseling, explanation of diagnosis of left MCA stroke, reviewing risk factor management of HTN, HLD, DM, discussion regarding stroke etiology due to hypercoagulable state of covid and to continue to follow with recommended providers, planning of further management along with potential future management, and discussion with patient and answering all questions to patient satisfaction    Frann Rider, AGNP-BC  [facility] [address] [place], [place] [address]  Phone [phone] Fax [phone] Note: This document was prepared with digital dictation and possible smart phrase technology. Any transcriptional errors that result from this process are unintentional.

## 2019-06-29 ENCOUNTER — Encounter: Payer: Self-pay | Admitting: Nurse Practitioner

## 2019-07-04 LAB — HM DIABETES EYE EXAM

## 2019-07-05 ENCOUNTER — Telehealth: Payer: Self-pay

## 2019-07-05 ENCOUNTER — Encounter: Payer: Self-pay | Admitting: Nurse Practitioner

## 2019-07-05 NOTE — Telephone Encounter (Signed)
The company did reach out to him but they didn't have a number for his insurance company, he gave them the number and they said they would contact him back but has yet to call him back   call [name] and see if he went to the INR clinic yet

## 2019-07-05 NOTE — Telephone Encounter (Signed)
He said he will come Tuesday for blood work

## 2019-07-05 NOTE — Telephone Encounter (Signed)
Okay thank you

## 2019-07-05 NOTE — Telephone Encounter (Signed)
Did he go to the coumadin clinic?

## 2019-07-09 ENCOUNTER — Ambulatory Visit (HOSPITAL_COMMUNITY): Payer: PRIVATE HEALTH INSURANCE | Attending: Cardiology

## 2019-07-09 ENCOUNTER — Other Ambulatory Visit: Payer: Self-pay

## 2019-07-09 DIAGNOSIS — E785 Hyperlipidemia, unspecified: Secondary | ICD-10-CM | POA: Insufficient documentation

## 2019-07-09 DIAGNOSIS — I10 Essential (primary) hypertension: Secondary | ICD-10-CM | POA: Insufficient documentation

## 2019-07-09 DIAGNOSIS — R0600 Dyspnea, unspecified: Secondary | ICD-10-CM | POA: Insufficient documentation

## 2019-07-09 DIAGNOSIS — Z8616 Personal history of COVID-19: Secondary | ICD-10-CM | POA: Insufficient documentation

## 2019-07-10 ENCOUNTER — Encounter: Payer: Self-pay | Admitting: Primary Care

## 2019-07-10 ENCOUNTER — Other Ambulatory Visit: Payer: Self-pay | Admitting: Nurse Practitioner

## 2019-07-10 ENCOUNTER — Ambulatory Visit (INDEPENDENT_AMBULATORY_CARE_PROVIDER_SITE_OTHER): Payer: PRIVATE HEALTH INSURANCE | Admitting: Primary Care

## 2019-07-10 ENCOUNTER — Other Ambulatory Visit: Payer: PRIVATE HEALTH INSURANCE

## 2019-07-10 DIAGNOSIS — G4733 Obstructive sleep apnea (adult) (pediatric): Secondary | ICD-10-CM

## 2019-07-10 DIAGNOSIS — U071 COVID-19: Secondary | ICD-10-CM | POA: Diagnosis not present

## 2019-07-10 DIAGNOSIS — I2699 Other pulmonary embolism without acute cor pulmonale: Secondary | ICD-10-CM

## 2019-07-10 DIAGNOSIS — J1282 Pneumonia due to coronavirus disease 2019: Secondary | ICD-10-CM | POA: Diagnosis not present

## 2019-07-10 DIAGNOSIS — J9611 Chronic respiratory failure with hypoxia: Secondary | ICD-10-CM

## 2019-07-10 NOTE — Patient Instructions (Signed)
COVID-19 pneumonia: - Clinically improving; no significant dyspnea or cough - CXR 06/26/19 showed improved bilateral airspace disease  - Sed rate mildly elevated 37- holding off on prednisone d/t stability of symptoms and hx diabetes  - Out on short term disability until end of May 2021  Chronic respiratory failure: - Not currently requiring oxygen at rest or light activity - Continue 2L with exercise only  - Continue physical therapy  Pulmonary embolism: - Provoked PE/DVT d/t covid  - Continue coumadin as prescribed  - D-dimer still elevated continue anticoagulation  - Echocardiogram 07/08/18 showed EF 65-70%, mild LVH, grade 1 diastolic dysfunction - [place] need for continued anticoagulation at follow-up in April/May   OSA: - Referred to sleep specialist

## 2019-07-10 NOTE — Progress Notes (Signed)
Virtual Visit via Telephone Note  I connected with [name] on 07/10/19 at  9:00 AM EST by telephone and verified that I am speaking with the correct person using two identifiers.  Location: Patient: Home Provider: Office   I discussed the limitations, risks, security and privacy concerns of performing an evaluation and management service by telephone and the availability of in person appointments. I also discussed with the patient that there may be a patient responsible charge related to this service. The patient expressed understanding and agreed to proceed.  History of Present Illness: 64 year old male, never smoked. PMH significant for pneumonia d/t COVID, bilateral PE, HTN, stroke, CKD stage 2, hyperglycemia, BKA, HLD, sleep apnea (not using CPAP). Patient of Dr. Chase Caller, seen for initial consult on 06/26/19.  Admitted to [facility] for COVID pneumonia on November 15th 2020. Transferred to [facility] on 05/02/19 for acute CVA. COVID pneumonia was complicated by DVT, acute PE, thrombocytopenia and superimposed bacterial pneumonia. Discharged mid December 2020. At discharge he was on 3L oxygen, '10mg'$  prednisone taper and Xarelto. Echo was May 03, 2019 with EF 65% and grade 1 diastolic dysfunction. Chest x-ray was April 27, 2019: That shows severe bilateral acute lung injury pattern. Continue physical therapy, monitor oxygen at home. Labs including serology, CXR and echo. If ESR high can consider 2-3 week course prednisone. If the ESR is normal and the chest x-ray is not giving [place] any clarity we might have to get a high-resolution CT chest. Refer to sleep specialist. Remain on short term disability through end of May 2021.   07/10/2019  Patient contacted today for two week follow-up/televisit. Patient is doing well. He is able to be at rest without oxygen and maintain pulse ox > 90-93% RA. He feels "winded" walking up incline or talking for more than 5 mins. He continues to wear 2L oxygen with  exercise, desaturates to 86-88% and recovers quickly <7mns. Working with physical therapy. Denies significant fatigue, dyspnea, chest tightness, wheezing or cough.   SYMPTOM SCALE - ILD 06/26/2019  07/10/2019   O2 use 2L at hime Only with exercise  Shortness of Breath 0 -> 5 scale with 5 being worst (score 6 If unable to do) 0 -> 5 scale with 5 being worst (score 6 If unable to do)  At rest 0 0  Simple tasks - showers, clothes change, eating, shaving 3 2  Household (dishes, doing bed, laundry) 4 3  Shopping 4 3  Walking level at own pace 4 2  Walking up Stairs 4 4  Total 30 (36) Dyspnea Score 19 14  How bad is your cough? 0 0  How bad is your fatigue 3 0  How bad is nausea 0 0  How bad is vomiting?  0 0  How bad is diarrhea? 0 0  How bad is anxiety? 0 0  How bad is depression 0 0     Observations/Objective:  - No shortness of breath, wheezing or cough noted during phone conversation  Assessment and Plan:  COVID-19 pneumonia: - Clinically improving; no significant dyspnea or cough - CXR 06/26/19 showed improved bilateral airspace disease  - Sed rate mildly elevated 37- holding off on prednisone d/t stability of symptoms and hx diabetes  - Out on short term disability until end of May 2021  Chronic respiratory failure: - Not currently requiring oxygen at rest or light activity - Continue 2L with exercise only  - Continues physical therapy  Pulmonary embolism: - Provoked PE/DVT d/t covid  -  Continue coumadin as prescribed - D-dimer still elevated continue anticoagulation per MR - Echocardiogram 07/08/18 showed EF 65-70%, mild LVH, grade 1 diastolic dysfunction - [place] need for continued anticoagulation at follow-up in April/May   OSA: - Referred to sleep specialist   Follow Up Instructions:   2-3 months with Dr. Chase Caller  I discussed the assessment and treatment plan with the patient. The patient was provided an opportunity to ask questions and all were answered.  The patient agreed with the plan and demonstrated an understanding of the instructions.   The patient was advised to call back or seek an in-person evaluation if the symptoms worsen or if the condition fails to improve as anticipated.  I provided 22 minutes of non-face-to-face time during this encounter.   Martyn Ehrich, NP

## 2019-07-11 LAB — PROTIME-INR
INR: 2 — ABNORMAL HIGH (ref 0.9–1.2)
Prothrombin Time: 20.5 s — ABNORMAL HIGH (ref 9.1–12.0)

## 2019-07-12 ENCOUNTER — Institutional Professional Consult (permissible substitution): Payer: PRIVATE HEALTH INSURANCE | Admitting: Pulmonary Disease

## 2019-07-19 NOTE — Progress Notes (Signed)
CARDIOLOGY CONSULT NOTE       Patient ID: [name] MRN: [id number] DOB/AGE: 1955/10/07 64 y.o.  Admit date: (Not on file) Referring Physician: Ramaswamy Primary Physician: [name], FNP Primary Cardiologist: New Reason for Consultation: PE  Active Problems:   * No active hospital problems. *   HPI:  65 y.o. referred by Dr Chase Caller for PE. History of right BKA, DM, HTL, HTN. Was admitted to [facility] April 08 9923 for COVID complicated by CVA and DVT with PE. D/c December on 3 L oxygen prednisone taper and coumadin had CVA while on xarelto so changed to coumadin  TTE reviewed and EF normal 65% no evidence of cor pulmonale Has OSA and using CPAP Working with PT and still has desats with activity on 2 L's Was placed on disability until May 2021   INR have not been very high 1.3 2 months ago and 2.0 on last two checks   He works as PA in [employer]   Hypercoag panel negative except mild elevation in anticardiolipin   He does not have clear direction about coming off oxygen or coumadin. I discussed my concerns that his INR has not been 2.5-3.0 Prior to COVID had no clotting issues and no cardiac issues   I was concerned that his INR was only 2.0 for the last 2 months and today we checked it in our office and it was only 1.8 Suspect this may be contributing to his continued need for oxygen. Given that he had thrombus with xarelto and bot CVA And large left popliteal DVT should run his INR 2.5 - 3.0    ROS All other systems reviewed and negative except as noted above  Past Medical History:  Diagnosis Date  . Diabetes mellitus without complication ([place])   . GERD (gastroesophageal reflux disease)   . Hx of right BKA ([place])    uses prosthetic   . Hyperlipidemia   . Hypertension   . Liver disorder    tx with ursodiol     Family History  Problem Relation Age of Onset  . Colon polyps Father   . Colon cancer Neg Hx   . Rectal cancer Neg Hx   . Stomach cancer Neg Hx       Social History   Socioeconomic History  . Marital status: Married    Spouse name: Not on file  . Number of children: 1  . Years of education: Not on file  . Highest education level: Not on file  Occupational History  . Not on file  Tobacco Use  . Smoking status: Never Smoker  . Smokeless tobacco: Never Used  Substance and Sexual Activity  . Alcohol use: No  . Drug use: No  . Sexual activity: Not on file  Other Topics Concern  . Not on file  Social History Narrative  . Not on file   Social Determinants of Health   Financial Resource Strain:   . Difficulty of Paying Living Expenses: Not on file  Food Insecurity:   . Worried About Charity fundraiser in the Last Year: Not on file  . Ran Out of Food in the Last Year: Not on file  Transportation Needs:   . Lack of Transportation (Medical): Not on file  . Lack of Transportation (Non-Medical): Not on file  Physical Activity:   . Days of Exercise per Week: Not on file  . Minutes of Exercise per Session: Not on file  Stress:   . Feeling of Stress :  Not on file  Social Connections:   . Frequency of Communication with Friends and Family: Not on file  . Frequency of Social Gatherings with Friends and Family: Not on file  . Attends Religious Services: Not on file  . Active Member of Clubs or Organizations: Not on file  . Attends Archivist Meetings: Not on file  . Marital Status: Not on file  Intimate Partner Violence:   . Fear of Current or Ex-Partner: Not on file  . Emotionally Abused: Not on file  . Physically Abused: Not on file  . Sexually Abused: Not on file    Past Surgical History:  Procedure Laterality Date  . BELOW KNEE LEG AMPUTATION Right   . CHOLECYSTECTOMY    . WISDOM TOOTH EXTRACTION        Current Outpatient Medications:  .  acetaminophen (TYLENOL) 500 MG tablet, Take 500 mg by mouth every 6 (six) hours as needed for mild pain or fever. , Disp: , Rfl:  .  Alcohol Swabs (ALCOHOL WIPES) 70 %  PADS, Use to wipe skin to check CBG, Disp: 100 each, Rfl: 0 .  Ascorbic Acid (VITAMIN C) 1000 MG tablet, Take 1,000 mg by mouth daily., Disp: , Rfl:  .  aspirin 81 MG chewable tablet, Chew 81 mg by mouth daily., Disp: , Rfl:  .  atorvastatin (LIPITOR) 20 MG tablet, TAKE 2 TABLETS BY MOUTH AT BEDTIME., Disp: 90 tablet, Rfl: 0 .  blood glucose meter kit and supplies KIT, Dispense based on patient and insurance preference. Use up to four times daily as directed. (FOR ICD-9 250.00, 250.01)., Disp: 1 each, Rfl: 0 .  famotidine (PEPCID) 20 MG tablet, Take 20 mg by mouth 2 (two) times daily., Disp: , Rfl:  .  fluticasone (FLONASE) 50 MCG/ACT nasal spray, Place 1 spray into both nostrils 2 (two) times daily at 10 AM and 5 PM., Disp: 16 g, Rfl: 1 .  Omega-3 Fatty Acids (FISH OIL PO), Take by mouth daily., Disp: , Rfl:  .  polyethylene glycol (MIRALAX / GLYCOLAX) 17 g packet, Take 17 g by mouth daily as needed for mild constipation., Disp: 14 each, Rfl: 0 .  tamsulosin (FLOMAX) 0.4 MG CAPS capsule, TAKE 1 CAPSULE BY MOUTH DAILY AFTER SUPPER., Disp: 30 capsule, Rfl: 0 .  ursodiol (ACTIGALL) 250 MG tablet, TAKE TWO TABLETS BY MOUTH DAILY, Disp: 180 tablet, Rfl: 1 .  valsartan-hydrochlorothiazide (DIOVAN HCT) 320-25 MG tablet, , Disp: , Rfl:  .  VITAMIN D PO, Take 5,000 Units by mouth daily., Disp: , Rfl:  .  warfarin (COUMADIN) 1 MG tablet, Take 1 tablet (1 mg total) by mouth daily., Disp: 30 tablet, Rfl: 11 .  warfarin (COUMADIN) 2 MG tablet, Take 2 tablets (4 mg total) by mouth daily. Take 4 mg daily- dose to be adjusted after INR check, Disp: 4 tablet, Rfl: 0    Physical Exam: There were no vitals taken for this visit.   Affect appropriate Healthy:  appears stated age 64: normal Neck supple with no adenopathy JVP normal no bruits no thyromegaly Lungs clear with no wheezing and good diaphragmatic motion Heart:  S1/S2 no murmur, no rub, gallop or click PMI normal Abdomen: benighn, BS positve, no  tenderness, no AAA no bruit.  No HSM or HJR Distal pulses intact with no bruits No edema Neuro non-focal Skin warm and dry Post right BKA    Labs:   Lab Results  Component Value Date   WBC 6.6 06/25/2019  HGB 14.5 06/25/2019   HCT 43.1 06/25/2019   MCV 91 06/25/2019   PLT 224 06/25/2019   No results for input(s): NA, K, CL, CO2, BUN, CREATININE, CALCIUM, PROT, BILITOT, ALKPHOS, ALT, AST, GLUCOSE in the last 168 hours.  Invalid input(s): LABALBU No results found for: CKTOTAL, CKMB, CKMBINDEX, TROPONINI  Lab Results  Component Value Date   CHOL 117 06/25/2019   CHOL 109 05/03/2019   CHOL 189 12/28/2018   Lab Results  Component Value Date   HDL 38 (L) 06/25/2019   HDL 46 05/03/2019   HDL 40 12/28/2018   Lab Results  Component Value Date   LDLCALC 52 06/25/2019   LDLCALC 54 05/03/2019   LDLCALC 122 (H) 12/28/2018   Lab Results  Component Value Date   TRIG 158 (H) 06/25/2019   TRIG 46 05/03/2019   TRIG 133 12/28/2018   Lab Results  Component Value Date   CHOLHDL 3.1 06/25/2019   CHOLHDL 2.4 05/03/2019   CHOLHDL 4.7 12/28/2018   No results found for: LDLDIRECT    Radiology: DG Chest 2 View  Result Date: 06/26/2019 CLINICAL DATA:  Routine F/U, Covid 19 in last Nov, sob on exertion remains, no other complains, HTN, DM. Hx of PE. hx covid-19 EXAM: CHEST - 2 VIEW COMPARISON:  Chest radiograph 04/27/2019, FINDINGS: Normal cardiac silhouette. There is improved aeration to the lungs with decrease in diffuse airspace disease seen on comparison exam. Low lung volumes persist. No pneumothorax or pneumomediastinum. IMPRESSION: Improved bilateral airspace disease with persistent low lung volumes. Electronically Signed   By: Suzy Bouchard M.D.   On: 06/26/2019 16:20   ECHOCARDIOGRAM COMPLETE  Result Date: 07/09/2019    ECHOCARDIOGRAM REPORT   Patient Name:   [name] Date of Exam: 07/09/2019 Medical Rec #:  [id number]       Height:       65.5 in Accession #:     [id number]      Weight:       210.0 lb Date of Birth:  January 07, 1956       BSA:          2.03 m Patient Age:    11 years        BP:           129/86 mmHg Patient Gender: M               HR:           101 bpm. Exam Location:  [place] Procedure: 2D Echo, Color Doppler and Cardiac Doppler Indications:    R06.00 Dyspnea  History:        Patient has prior history of Echocardiogram examinations, most                 recent 05/03/2019. DVT; Risk Factors:Hypertension and HLD. COVID                 19 (November).  Sonographer:    Marygrace Drought RCS Referring Phys: [place]  1. Left ventricular ejection fraction, by estimation, is 65 to 70%. The left ventricle has normal function. The left ventricle has no regional wall motion abnormalities. There is mild left ventricular hypertrophy. Left ventricular diastolic parameters are consistent with Grade I diastolic dysfunction (impaired relaxation). GLS -21.2% (normal).  2. Right ventricular systolic function is normal. The right ventricular size is normal. Tricuspid regurgitation signal is inadequate for assessing PA pressure.  3. The mitral valve is normal in structure and function. No  evidence of mitral valve regurgitation. No evidence of mitral stenosis.  4. The aortic valve is tricuspid. Aortic valve regurgitation is not visualized. No aortic stenosis is present.  5. Aortic dilatation noted. There is mild dilatation of the aortic root measuring 39 mm.  6. The inferior vena cava is normal in size with greater than 50% respiratory variability, suggesting right atrial pressure of 3 mmHg. FINDINGS  Left Ventricle: Left ventricular ejection fraction, by estimation, is 65 to 70%. The left ventricle has normal function. The left ventricle has no regional wall motion abnormalities. There is mild left ventricular hypertrophy. Left ventricular diastolic  parameters are consistent with Grade I diastolic dysfunction (impaired relaxation). Right Ventricle: The  right ventricular size is normal. No increase in right ventricular wall thickness. Right ventricular systolic function is normal. Tricuspid regurgitation signal is inadequate for assessing PA pressure. Left Atrium: Left atrial size was normal in size. Right Atrium: Right atrial size was normal in size. Pericardium: There is no evidence of pericardial effusion. Mitral Valve: The mitral valve is normal in structure and function. No evidence of mitral valve regurgitation. No evidence of mitral valve stenosis. Tricuspid Valve: The tricuspid valve is normal in structure. Tricuspid valve regurgitation is not demonstrated. Aortic Valve: The aortic valve is tricuspid. Aortic valve regurgitation is not visualized. No aortic stenosis is present. Pulmonic Valve: The pulmonic valve was normal in structure. Pulmonic valve regurgitation is not visualized. Aorta: Aortic dilatation noted. There is mild dilatation of the aortic root measuring 39 mm. Venous: The inferior vena cava is normal in size with greater than 50% respiratory variability, suggesting right atrial pressure of 3 mmHg. IAS/Shunts: No atrial level shunt detected by color flow Doppler.  LEFT VENTRICLE PLAX 2D LVIDd:         2.93 cm  Diastology LVIDs:         2.08 cm  LV e' lateral:   6.74 cm/s LV PW:         1.19 cm  LV E/e' lateral: 12.0 LV IVS:        1.25 cm  LV e' medial:    6.42 cm/s LVOT diam:     2.10 cm  LV E/e' medial:  12.6 LV SV:         75.16 ml LV SV Index:   8.88 LVOT Area:     3.46 cm  RIGHT VENTRICLE RV Basal diam:  3.08 cm RV S prime:     15.00 cm/s TAPSE (M-mode): 1.7 cm LEFT ATRIUM             Index       RIGHT ATRIUM           Index LA diam:        3.10 cm 1.53 cm/m  RA Area:     12.70 cm LA Vol (A2C):   35.1 ml 17.28 ml/m RA Volume:   22.40 ml  11.03 ml/m LA Vol (A4C):   22.4 ml 11.03 ml/m LA Biplane Vol: 30.8 ml 15.16 ml/m  AORTIC VALVE LVOT Vmax:   108.00 cm/s LVOT Vmean:  85.800 cm/s LVOT VTI:    0.217 m  AORTA Ao Root diam: 3.90 cm  MITRAL VALVE MV Area (PHT):              SHUNTS MV Decel Time:              Systemic VTI:  0.22 m MV E velocity: 81.00 cm/s   Systemic Diam: 2.10 cm MV A velocity:  118.00 cm/s MV E/A ratio:  0.69 Loralie Champagne MD Electronically signed by Loralie Champagne MD Signature Date/Time: 07/09/2019/9:10:55 PM    Final     EKG: SR LAFB PaC no RV strain    ASSESSMENT AND PLAN:   1. PE:  Related to hypercoagulability state during COVID 19 infection CT reviewed 04/09/19 with mild clot burden subsegmental RLL Would think that 6 months of anticoagulation would be sufficient. Duration of coumadin should be determined by pulmonary who have cared for him since hospitalization CXR 06/26/19 showed improved airspace disease with low lung volumes INR should be run a bit higher LE venous duplex 05/03/19  with PT/peroneal clot have ordered repeat study Echo showed normal RV size and function with no cor pulmonale   2. CVA:  During hypercoagulable state CT head 11/24 normal MRI 05/01/19 with left MCA territory micro emboli no carotid disease on CTA and no PAF during hospitalization Only residual deficit is right hand dexterity   3. HLD:  Continue statin labs with primary   4. HTN:  Well controlled.  Continue current medications and low sodium Dash type diet.   5. Anticoagulation: needs better f/u Increase coumadin to 4 mg daily and 3 mg on Tuesday/Thursday f/u with pulmonary / primary in 2 weeks to check     F/U with cardiology PRN   F/U D dimer/C reactive protein INR today adjust coumadin to goal 2.5-3.0 CTA document resolution of PE given persistent need for oxygen LE venous duplex document recanalization decreased clot burden in left popliteal Needs f/u with pulmonary / primary to determine when coumadin can be stopped  And oxygen. Needs closer f/u of his  INR !!    SignedJenkins Rouge 07/19/2019, 8:56 AM

## 2019-07-24 ENCOUNTER — Other Ambulatory Visit (INDEPENDENT_AMBULATORY_CARE_PROVIDER_SITE_OTHER): Payer: PRIVATE HEALTH INSURANCE

## 2019-07-24 ENCOUNTER — Encounter: Payer: Self-pay | Admitting: Cardiovascular Disease

## 2019-07-24 ENCOUNTER — Other Ambulatory Visit: Payer: Self-pay

## 2019-07-24 ENCOUNTER — Ambulatory Visit: Payer: PRIVATE HEALTH INSURANCE | Admitting: Cardiovascular Disease

## 2019-07-24 VITALS — BP 128/78 | HR 89 | Ht 65.5 in | Wt 213.0 lb

## 2019-07-24 DIAGNOSIS — Z7901 Long term (current) use of anticoagulants: Secondary | ICD-10-CM

## 2019-07-24 DIAGNOSIS — I82492 Acute embolism and thrombosis of other specified deep vein of left lower extremity: Secondary | ICD-10-CM

## 2019-07-24 DIAGNOSIS — I2699 Other pulmonary embolism without acute cor pulmonale: Secondary | ICD-10-CM | POA: Diagnosis not present

## 2019-07-24 DIAGNOSIS — Z5181 Encounter for therapeutic drug level monitoring: Secondary | ICD-10-CM

## 2019-07-24 LAB — D-DIMER, QUANTITATIVE: D-DIMER: 0.4 mg/L FEU (ref 0.00–0.49)

## 2019-07-24 LAB — POCT INR: INR: 1.8 — AB (ref 2.0–3.0)

## 2019-07-24 NOTE — Addendum Note (Signed)
Addended by: Airam Heidecker E on: 07/24/2019 01:18 PM   Modules accepted: Orders

## 2019-07-24 NOTE — Patient Instructions (Signed)
Medication Instructions:  *If you need a refill on your cardiac medications before your next appointment, please call your pharmacy*  Please take an extra half of warfarin today (6 mg). Take 3 mg of warfarin Tuesday and Thursdays, and take 4 mg on all other days.   INR was 1.8 today  Lab Work: Your physician recommends that you return for lab work in: D-dimer and c reactive protein.  If you have labs (blood work) drawn today and your tests are completely normal, you will receive your results only by: Marland Kitchen MyChart Message (if you have MyChart) OR . A paper copy in the mail If you have any lab test that is abnormal or we need to change your treatment, we will call you to review the results.   Testing/Procedures: Non-Cardiac CT Angiography (CTA) to follow up on PE, is a special type of CT scan that uses a computer to produce multi-dimensional views of major blood vessels throughout the body. In CT angiography, a contrast material is injected through an IV to help visualize the blood vessels  Your physician has requested that you have a lower extremity venous duplex. This test is an ultrasound of the veins in the legs. It looks at venous blood flow that carries blood from the heart to the legs. Allow one hour for a Lower Venous exam. There are no restrictions or special instructions.  Follow-Up: At [facility], you and your health needs are our priority.  As part of our continuing mission to provide you with exceptional heart care, we have created designated Provider Care Teams.  These Care Teams include your primary Cardiologist (physician) and Advanced Practice Providers (APPs -  Physician Assistants and Nurse Practitioners) who all work together to provide you with the care you need, when you need it.  We recommend signing up for the patient portal called "MyChart".  Sign up information is provided on this After Visit Summary.  MyChart is used to connect with patients for Virtual Visits  (Telemedicine).  Patients are able to view lab/test results, encounter notes, upcoming appointments, etc.  Non-urgent messages can be sent to your provider as well.   To learn more about what you can do with MyChart, go to [email or web address].    Your next appointment:   1 year(s)  The format for your next appointment:   In Person  Provider:   You may see Dr. Johnsie Cancel or one of the following Advanced Practice Providers on your designated Care Team:    Truitt Merle, NP  Cecilie Kicks, NP  Kathyrn Drown, NP

## 2019-07-24 NOTE — Progress Notes (Signed)
Patient's INR was checked at office visit today. INR was 1.8

## 2019-07-25 ENCOUNTER — Other Ambulatory Visit: Payer: Self-pay | Admitting: Nurse Practitioner

## 2019-07-25 ENCOUNTER — Encounter: Payer: Self-pay | Admitting: Nurse Practitioner

## 2019-07-25 DIAGNOSIS — I2699 Other pulmonary embolism without acute cor pulmonale: Secondary | ICD-10-CM

## 2019-07-25 LAB — C-REACTIVE PROTEIN: CRP: 13 mg/L — ABNORMAL HIGH (ref 0–10)

## 2019-07-26 ENCOUNTER — Other Ambulatory Visit: Payer: Self-pay

## 2019-07-26 ENCOUNTER — Ambulatory Visit (HOSPITAL_COMMUNITY)
Admission: RE | Admit: 2019-07-26 | Discharge: 2019-07-26 | Disposition: A | Discharge status: Home / Self Care | Payer: PRIVATE HEALTH INSURANCE | Source: Ambulatory Visit | Attending: Cardiology | Admitting: Cardiology

## 2019-07-26 ENCOUNTER — Ambulatory Visit (INDEPENDENT_AMBULATORY_CARE_PROVIDER_SITE_OTHER)
Admission: RE | Admit: 2019-07-26 | Discharge: 2019-07-26 | Disposition: A | Discharge status: Home / Self Care | Payer: PRIVATE HEALTH INSURANCE | Source: Ambulatory Visit | Attending: Cardiovascular Disease | Admitting: Cardiovascular Disease

## 2019-07-26 DIAGNOSIS — I2699 Other pulmonary embolism without acute cor pulmonale: Secondary | ICD-10-CM

## 2019-07-26 DIAGNOSIS — I82492 Acute embolism and thrombosis of other specified deep vein of left lower extremity: Secondary | ICD-10-CM | POA: Diagnosis not present

## 2019-07-26 MED ORDER — IOHEXOL 350 MG/ML SOLN
80.0000 mL | Freq: Once | INTRAVENOUS | Status: AC | PRN
  Administered 2019-07-26: 80 mL via INTRAVENOUS

## 2019-07-27 ENCOUNTER — Encounter: Payer: Self-pay | Admitting: Nurse Practitioner

## 2019-07-27 DIAGNOSIS — Z7901 Long term (current) use of anticoagulants: Secondary | ICD-10-CM

## 2019-07-27 LAB — POCT INR: INR: 2.3 — AB (ref 0.9–1.1)

## 2019-07-28 ENCOUNTER — Other Ambulatory Visit: Payer: Self-pay | Admitting: Nurse Practitioner

## 2019-07-30 ENCOUNTER — Telehealth: Payer: Self-pay | Admitting: Internal Medicine

## 2019-07-30 ENCOUNTER — Encounter: Payer: Self-pay | Admitting: Nurse Practitioner

## 2019-07-31 ENCOUNTER — Encounter: Payer: Self-pay | Admitting: Pulmonary Disease

## 2019-07-31 ENCOUNTER — Ambulatory Visit: Payer: PRIVATE HEALTH INSURANCE | Admitting: Pulmonary Disease

## 2019-07-31 ENCOUNTER — Other Ambulatory Visit: Payer: Self-pay

## 2019-07-31 ENCOUNTER — Encounter (HOSPITAL_COMMUNITY): Payer: PRIVATE HEALTH INSURANCE

## 2019-07-31 VITALS — BP 134/70 | HR 57 | Temp 98.1°F | Ht 65.5 in | Wt 212.8 lb

## 2019-07-31 DIAGNOSIS — Z87898 Personal history of other specified conditions: Secondary | ICD-10-CM

## 2019-07-31 DIAGNOSIS — G4733 Obstructive sleep apnea (adult) (pediatric): Secondary | ICD-10-CM | POA: Diagnosis not present

## 2019-07-31 NOTE — Progress Notes (Signed)
Subjective:    Patient ID: [name], male    DOB: 12/29/1955, 64 y.o.   MRN: [id number]  Patient being seen for concern for obstructive sleep apnea  Diagnosed with obstructive sleep apnea about 7 years ago Use CPAP for about a year  Recently has not been having any significant symptoms Was recently hospitalized for Covid Following up with Dr. Chase Caller for possible fibrosis  Currently denies snoring, he did specifically ask his spouse and was told he was not snoring No witnessed apneas Daytime sleepiness  Admits to occasional sweats His memory is good No family history of obstructive sleep apnea He feels his sleep is restorative  Except feeling like he is at a good nights rest on most nights  Has no symptoms concerning to him or his spouse for obstructive sleep apnea  Past Medical History:  Diagnosis Date  . Diabetes mellitus without complication ([place])   . GERD (gastroesophageal reflux disease)   . Hx of right BKA ([place])    uses prosthetic   . Hyperlipidemia   . Hypertension   . Liver disorder    tx with ursodiol    Social History   Socioeconomic History  . Marital status: Married    Spouse name: Not on file  . Number of children: 1  . Years of education: Not on file  . Highest education level: Not on file  Occupational History  . Not on file  Tobacco Use  . Smoking status: Never Smoker  . Smokeless tobacco: Never Used  Substance and Sexual Activity  . Alcohol use: No  . Drug use: No  . Sexual activity: Not on file  Other Topics Concern  . Not on file  Social History Narrative  . Not on file   Social Determinants of Health   Financial Resource Strain:   . Difficulty of Paying Living Expenses: Not on file  Food Insecurity:   . Worried About Charity fundraiser in the Last Year: Not on file  . Ran Out of Food in the Last Year: Not on file  Transportation Needs:   . Lack of Transportation (Medical): Not on file  . Lack of Transportation  (Non-Medical): Not on file  Physical Activity:   . Days of Exercise per Week: Not on file  . Minutes of Exercise per Session: Not on file  Stress:   . Feeling of Stress : Not on file  Social Connections:   . Frequency of Communication with Friends and Family: Not on file  . Frequency of Social Gatherings with Friends and Family: Not on file  . Attends Religious Services: Not on file  . Active Member of Clubs or Organizations: Not on file  . Attends Archivist Meetings: Not on file  . Marital Status: Not on file  Intimate Partner Violence:   . Fear of Current or Ex-Partner: Not on file  . Emotionally Abused: Not on file  . Physically Abused: Not on file  . Sexually Abused: Not on file   Family History  Problem Relation Age of Onset  . Colon polyps Father   . Colon cancer Neg Hx   . Rectal cancer Neg Hx   . Stomach cancer Neg Hx      Review of Systems  Constitutional: Negative for fever and unexpected weight change.  HENT: Positive for dental problem. Negative for congestion, ear pain, nosebleeds, postnasal drip, rhinorrhea, sinus pressure, sneezing, sore throat and trouble swallowing.   Eyes: Negative for redness and itching.  Respiratory: Negative for cough, chest tightness, shortness of breath and wheezing.   Cardiovascular: Negative for palpitations and leg swelling.  Gastrointestinal: Negative for nausea and vomiting.  Genitourinary: Negative for dysuria.  Musculoskeletal: Negative for joint swelling.  Skin: Negative for rash.  Allergic/Immunologic: Negative.  Negative for environmental allergies, food allergies and immunocompromised state.  Neurological: Negative for headaches.  Hematological: Does not bruise/bleed easily.  Psychiatric/Behavioral: Negative for dysphoric mood. The patient is not nervous/anxious.        Objective:   Physical Exam  Vitals:   07/31/19 1001  BP: 134/70  Pulse: (!) 57  Temp: 98.1 F (36.7 C)  SpO2: 99%   Results of the  Epworth flowsheet 07/31/2019  Sitting and reading 0  Watching TV 1  Sitting, inactive in a public place (e.g. a theatre or a meeting) 0  As a passenger in a car for an hour without a break 1  Lying down to rest in the afternoon when circumstances permit 2  Sitting and talking to someone 0  Sitting quietly after a lunch without alcohol 0  In a car, while stopped for a few minutes in traffic 0  Total score 4      Assessment & Plan:  .  Past history obstructive sleep apnea .  Denies excessive daytime sleepiness, denies significant snoring .  Denies any other significant symptoms concerning for sleep disordered breathing at present .  He continues to lose weight  Pathophysiology of sleep disordered breathing discussed  At present, he does not have significant symptoms to justify a sleep study or further evaluation  I did encourage him to continue monitoring symptoms closely  If he were to develop any symptoms suggestive of sleep disordered breathing including snoring, witnessed apneas, daytime sleepiness, nonrestorative sleep Then we can decide at that time to further evaluate.  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

## 2019-07-31 NOTE — Patient Instructions (Signed)
No symptoms to suggest significant obstructive sleep apnea at present  Continue to watch for symptoms which may include daytime sleepiness, snoring, apneas, gasping respirations at night Need for naps during the day  If any of the above occurs then a sleep study may be beneficial at that time  Continue weight loss efforts  I will see you as needed  Continue following up with Dr. Chase Caller

## 2019-08-02 ENCOUNTER — Encounter: Payer: Self-pay | Admitting: Nurse Practitioner

## 2019-08-02 DIAGNOSIS — Z7901 Long term (current) use of anticoagulants: Secondary | ICD-10-CM | POA: Diagnosis not present

## 2019-08-02 LAB — POCT INR: INR: 1.9 — AB (ref 0.9–1.1)

## 2019-08-03 NOTE — Telephone Encounter (Signed)
We have recived the signed forms back sent over to [employer]

## 2019-08-06 ENCOUNTER — Encounter: Payer: Self-pay | Admitting: Nurse Practitioner

## 2019-08-08 ENCOUNTER — Other Ambulatory Visit: Payer: Self-pay | Admitting: Nurse Practitioner

## 2019-08-08 DIAGNOSIS — E782 Mixed hyperlipidemia: Secondary | ICD-10-CM

## 2019-08-09 ENCOUNTER — Encounter: Payer: Self-pay | Admitting: Nurse Practitioner

## 2019-08-09 DIAGNOSIS — Z7901 Long term (current) use of anticoagulants: Secondary | ICD-10-CM | POA: Diagnosis not present

## 2019-08-09 LAB — PROTIME-INR: INR: 2.4 — AB (ref 0.9–1.1)

## 2019-08-10 ENCOUNTER — Encounter: Payer: Self-pay | Admitting: Nurse Practitioner

## 2019-08-16 ENCOUNTER — Encounter: Payer: Self-pay | Admitting: Nurse Practitioner

## 2019-08-22 ENCOUNTER — Telehealth: Payer: Self-pay

## 2019-08-22 NOTE — Telephone Encounter (Signed)
I called patient to confirm his coumadin dose and it was 4mg  and I advised him to go down to 3mg  per JM. YL,RMA

## 2019-08-23 ENCOUNTER — Encounter: Payer: Self-pay | Admitting: Nurse Practitioner

## 2019-08-28 ENCOUNTER — Encounter: Payer: Self-pay | Admitting: Nurse Practitioner

## 2019-08-28 DIAGNOSIS — Z7901 Long term (current) use of anticoagulants: Secondary | ICD-10-CM

## 2019-08-28 LAB — POCT INR: INR: 1.9 — AB (ref 0.9–1.1)

## 2019-08-29 NOTE — Telephone Encounter (Signed)
Call him to increase his coumadin by 1mg  daily

## 2019-08-31 DIAGNOSIS — Z7901 Long term (current) use of anticoagulants: Secondary | ICD-10-CM

## 2019-08-31 LAB — POCT INR: INR: 2.4 — AB (ref 0.9–1.1)

## 2019-09-01 ENCOUNTER — Encounter: Payer: Self-pay | Admitting: Nurse Practitioner

## 2019-09-03 ENCOUNTER — Encounter: Payer: Self-pay | Admitting: Nurse Practitioner

## 2019-09-05 ENCOUNTER — Telehealth: Payer: Self-pay | Admitting: Internal Medicine

## 2019-09-07 ENCOUNTER — Encounter: Payer: Self-pay | Admitting: Nurse Practitioner

## 2019-09-07 DIAGNOSIS — Z7901 Long term (current) use of anticoagulants: Secondary | ICD-10-CM | POA: Diagnosis not present

## 2019-09-07 LAB — POCT INR: INR: 3 — AB (ref 0.9–1.1)

## 2019-09-11 ENCOUNTER — Encounter: Payer: Self-pay | Admitting: Nurse Practitioner

## 2019-09-13 NOTE — Telephone Encounter (Signed)
I do have pt's disability paperwork. It has not yet been signed by MR. Will give this back to MR when he returns back to clinic.

## 2019-09-14 DIAGNOSIS — Z7901 Long term (current) use of anticoagulants: Secondary | ICD-10-CM | POA: Diagnosis not present

## 2019-09-14 LAB — POCT INR: INR: 1.8 — AB (ref 0.9–1.1)

## 2019-09-15 ENCOUNTER — Encounter: Payer: Self-pay | Admitting: Nurse Practitioner

## 2019-09-17 ENCOUNTER — Other Ambulatory Visit: Payer: Self-pay | Admitting: Nurse Practitioner

## 2019-09-17 ENCOUNTER — Encounter: Payer: Self-pay | Admitting: Nurse Practitioner

## 2019-09-17 NOTE — Telephone Encounter (Signed)
Forms given to MR 4/26.

## 2019-09-18 NOTE — Telephone Encounter (Signed)
Dr. Chase Caller have you received these forms to sign?

## 2019-09-19 NOTE — Telephone Encounter (Signed)
Spoke to patient - current disabilty extendds through 10/21/19. It is Short term . HE is looking for long term. Still having difficulty dyspnea talking a  Lot and walking 500-600 feet. Overall improved but still having difficulty. Not sure he can keep up with demands of ER. Off oxygen  I signed the form but not filled it out. Seeing APP on 09/21/19  Plan  - Pls pick form from side desk and give to Derl Barrow who is seeing patient on 09/21/19.   - I am requesting she fill the form balancing his goals and disability - I am avaialble to answer questions  -Alternative, she can eval him and using her note I can fill the unfilled areas  D/w patient. He is agreable    SIGNATURE    Dr. Brand Males, M.D., F.C.C.P,  Pulmonary and Critical Care Medicine Staff Physician, [place] Director - Interstitial Lung Disease  Program  Pulmonary [place] [facility] [place], [place], [address]  Pager: [phone], If no answer or between  15:00h - 7:00h: call [phone] Telephone: [phone]  2:13 PM 09/19/2019

## 2019-09-21 ENCOUNTER — Other Ambulatory Visit: Payer: Self-pay

## 2019-09-21 ENCOUNTER — Encounter: Payer: Self-pay | Admitting: Primary Care

## 2019-09-21 ENCOUNTER — Encounter: Payer: Self-pay | Admitting: Nurse Practitioner

## 2019-09-21 ENCOUNTER — Ambulatory Visit: Payer: PRIVATE HEALTH INSURANCE | Admitting: Primary Care

## 2019-09-21 DIAGNOSIS — Z7901 Long term (current) use of anticoagulants: Secondary | ICD-10-CM

## 2019-09-21 DIAGNOSIS — U071 COVID-19: Secondary | ICD-10-CM | POA: Diagnosis not present

## 2019-09-21 DIAGNOSIS — J1282 Pneumonia due to coronavirus disease 2019: Secondary | ICD-10-CM | POA: Diagnosis not present

## 2019-09-21 DIAGNOSIS — I825Y2 Chronic embolism and thrombosis of unspecified deep veins of left proximal lower extremity: Secondary | ICD-10-CM

## 2019-09-21 LAB — POCT INR: INR: 3.4 — AB (ref 0.9–1.1)

## 2019-09-21 NOTE — Progress Notes (Signed)
_0  ID: [name], male    DOB: 1955-10-16, 64 y.o.   MRN: [id number]  Chief Complaint  Patient presents with  . Follow-up    Referring provider: [name], FNP  HPI: 64 year old male, never smoked. PMH significant for pneumonia d/t COVID, bilateral PE, HTN, stroke, CKD stage 2, hyperglycemia, BKA, HLD, sleep apnea (not using CPAP). Patient of Dr. Chase Caller, seen for initial consult on 06/26/19.  Admitted to [facility] for COVID pneumonia on November 15th 2020. Transferred to [facility] on 05/02/19 for acute CVA. COVID pneumonia was complicated by DVT, acute PE, thrombocytopenia and superimposed bacterial pneumonia. Discharged mid December 2020. At discharge he was on 3L oxygen, 32m prednisone taper and Xarelto. Echo was May 03, 2019 with EF 65% and grade 1 diastolic dysfunction. Chest x-ray was April 27, 2019: That shows severe bilateral acute lung injury pattern. Continue physical therapy, monitor oxygen at home. Labs including serology, CXR and echo. If ESR high can consider 2-3 week course prednisone. If the ESR is normal and the chest x-ray is not giving [place] clarity we might have to get a high-resolution CT chest. Refer to sleep specialist. Remain on short term disability through end of May 2021.   07/10/2019  Patient contacted today for two week follow-up/televisit. Patient is doing well. He is able to be at rest without oxygen and maintain pulse ox > 90-93% RA. He feels "winded" walking up incline or talking for more than 5 mins. He continues to wear 2L oxygen with exercise, desaturates to 86-88% and recovers quickly <143ms. Working with physical therapy. Denies significant fatigue, dyspnea, chest tightness, wheezing or cough.     09/21/2019 Patient presents today for follow-up visit. He is feeling a great deal better since February, he has not needed to use oxygen in the last month. He still experiences moderate about of dyspnea with activities. He is able to walk down the aile  of a grocery store but he is unable to walk around a large shopping complex like [employer] or [employer] without becoming significantly short of breath. He also reports getting winded after speaking for more than 5-6 mins. Takes him about 10-20 seconds to catch his breath which is an improvement from previous 1-2 min it took him to recover. He did not have pulmonary rehab but had home physical therapy which he still does exercises at home. He has noticed some brain fog since COVID diagnosis and difficulty recalling words. He is currently out on short term disability. 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 He would like to return to work but does not feel confident that he will be able to keep up with demands or pace of work load. He is in the process of applying for long term disability.He had a follow-up CTA with cardiology which showed resolution of PE, improved infiltrates from covid. Doppler showed chronic left DVT. Plan continue warfarin at least another 3-6 month.    SYMPTOM SCALE - ILD 06/26/2019  07/10/2019  09/21/2019   O2 use 2L at hime Only with exercise Not currently  Shortness of Breath 0 -> 5 scale with 5 being worst (score 6 If unable to do) 0 -> 5 scale with 5 being worst (score 6 If unable to do)   At rest 0 0 0  Simple tasks - showers, clothes change, eating, shaving _1 Household (dishes, doing bed, laundry) _2 Shopping _3 Walking  level at own pace _0 Walking up Stairs _1 Total 30 (36) Dyspnea Score _2 How bad is your cough? 0 0 0  How bad is your fatigue 3 0 1  How bad is nausea 0 0 0  How bad is vomiting?  0 0 0  How bad is diarrhea? 0 0 0  How bad is anxiety? 0 0 0  How bad is depression 0 0 0    No Known Allergies  Immunization History  Administered Date(s) Administered  . Influenza,inj,Quad PF,6+ Mos 03/11/2019  . Moderna SARS-COVID-2 Vaccination 07/17/2019    Past Medical History:  Diagnosis Date  .  Diabetes mellitus without complication ([place])   . GERD (gastroesophageal reflux disease)   . 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 Hyperlipidemia   . Hypertension   . Liver disorder    tx with ursodiol     Tobacco History: Social History   Tobacco Use  Smoking Status Never Smoker  Smokeless Tobacco Never Used   Counseling given: Not Answered   Outpatient Medications Prior to Visit  Medication Sig Dispense Refill  . acetaminophen (TYLENOL) 500 MG tablet Take 500 mg by mouth every 6 (six) hours as needed for mild pain or fever.     . Alcohol Swabs (ALCOHOL WIPES) 70 % PADS Use to wipe skin to check CBG 100 each 0  . Ascorbic Acid (VITAMIN C) 1000 MG tablet Take 1,000 mg by mouth daily.    Marland Kitchen aspirin 81 MG chewable tablet Chew 81 mg by mouth daily.    Marland Kitchen atorvastatin (LIPITOR) 20 MG tablet TAKE 2 TABLETS BY MOUTH AT BEDTIME. 90 tablet 0  . blood glucose meter kit and supplies KIT Dispense based on patient and insurance preference. Use up to four times daily as directed. (FOR ICD-9 250.00, 250.01). 1 each 0  . famotidine (PEPCID) 20 MG tablet Take 40 mg by mouth as needed.     . Omega-3 Fatty Acids (FISH OIL PO) Take by mouth daily.    . tamsulosin (FLOMAX) 0.4 MG CAPS capsule TAKE 1 CAPSULE BY MOUTH DAILY AFTER SUPPER. 30 capsule 0  . ursodiol (ACTIGALL) 250 MG tablet TAKE TWO TABLETS BY MOUTH DAILY 180 tablet 1  . valsartan-hydrochlorothiazide (DIOVAN-HCT) 320-25 MG tablet TAKE 1 TABLET BY MOUTH DAILY. 90 tablet 0  . VITAMIN D PO Take 5,000 Units by mouth daily.    Marland Kitchen warfarin (COUMADIN) 1 MG tablet Take 1 tablet (1 mg total) by mouth daily. 30 tablet 11  . warfarin (COUMADIN) 2 MG tablet TAKE 2 TABLETS BY MOUTH DAILY. DOSE TO BE ADJUSTED AFTER INR CHECK 60 tablet 2   No facility-administered medications prior to visit.   Review of Systems  Review of Systems  Respiratory: Positive for shortness of breath. Negative for cough and wheezing.   Cardiovascular: Positive for leg  swelling.   Physical Exam  BP 122/78 (BP Location: Left Arm, Cuff Size: Normal)   Pulse 74   Temp (!) 97.3 F (36.3 C) (Temporal)   Ht 5' 5.5" (1.664 m)   Wt 222 lb 9.6 oz (101 kg)   SpO2 94%   BMI 36.48 kg/m  Physical Exam Constitutional:      Appearance: Normal appearance.  Cardiovascular:     Comments: +trace-1 LLE Pulmonary:     Effort: Pulmonary effort is normal.     Breath sounds: No wheezing.     Comments: Diminished, no  rales Musculoskeletal:     Comments: Right BKA  Neurological:     Mental Status: He is alert.  Psychiatric:        Mood and Affect: Mood normal.        Behavior: Behavior normal.        Thought Content: Thought content normal.        Judgment: Judgment normal.      Lab Results:  CBC    Component Value Date/Time   WBC 6.6 06/25/2019 1656   WBC 8.0 05/07/2019 0242   RBC 4.74 06/25/2019 1656   RBC 4.15 (L) 05/07/2019 0242   HGB 14.5 06/25/2019 1656   HCT 43.1 06/25/2019 1656   PLT 224 06/25/2019 1656   MCV 91 06/25/2019 1656   MCH 30.6 06/25/2019 1656   MCH 31.1 05/07/2019 0242   MCHC 33.6 06/25/2019 1656   MCHC 32.1 05/07/2019 0242   RDW 13.8 06/25/2019 1656   LYMPHSABS 1.7 05/02/2019 0610   MONOABS 1.1 (H) 05/02/2019 0610   EOSABS 0.2 05/02/2019 0610   BASOSABS 0.0 05/02/2019 0610    BMET    Component Value Date/Time   NA 143 06/25/2019 1656   K 4.3 06/25/2019 1656   CL 104 06/25/2019 1656   CO2 23 06/25/2019 1656   GLUCOSE 101 (H) 06/25/2019 1656   GLUCOSE 110 (H) 05/02/2019 0610   BUN 9 06/25/2019 1656   CREATININE 0.89 06/25/2019 1656   CALCIUM 9.1 06/25/2019 1656   GFRNONAA 91 06/25/2019 1656   GFRAA 105 06/25/2019 1656    BNP    Component Value Date/Time   BNP 59.9 04/27/2019 0039    ProBNP    Component Value Date/Time   PROBNP 22.0 06/26/2019 0959    Imaging: No results found.   Assessment & Plan:   Pneumonia due to COVID-19 virus - Clincally improving. Off oxygen, continues to have moderate  dyspnea with extended periods of exertion  - CTA 07/26/18 showed resolved PE, diffuse parenchymal haziness in both lungs has improved but not resolved - We will submit paper work for long term disbility to [employer]  - Maintained O2 >90% RA on ambulatory walk, ok to discontinue oxygen  - Repeat HRCT in 6 months and FU with Dr. Chase Caller   Left leg DVT ([place]) - Continue warfarin for another 3-6 months per cardiology recommendations d.t chronic left DVT, follow up with PCP. Would consider repeat ultrasound and possible referral to vascular   >30 mins spent  Martyn Ehrich, NP 09/22/2019

## 2019-09-21 NOTE — Patient Instructions (Addendum)
Recommendations: -- Continue warfarin for another 3-6 months per cardiology recommendations d.t chronic left DVT, follow up with PCP. Would consider repeat ultrasound and possible referral to vascular  -- We will submit paper work for disbility to [employer]   Orders: Ok to discontinue oxygen if maintaining >88-90% on room air   Orders: HRCT in 6 months fu covid pneumonia  Follow-up: 6 months with Dr. Chase Caller

## 2019-09-22 ENCOUNTER — Encounter: Payer: Self-pay | Admitting: Primary Care

## 2019-09-22 DIAGNOSIS — I82402 Acute embolism and thrombosis of unspecified deep veins of left lower extremity: Secondary | ICD-10-CM | POA: Insufficient documentation

## 2019-09-22 HISTORY — DX: Acute embolism and thrombosis of unspecified deep veins of left lower extremity: I82.402

## 2019-09-22 NOTE — Assessment & Plan Note (Signed)
-   Continue warfarin for another 3-6 months per cardiology recommendations d.t chronic left DVT, follow up with PCP. Would consider repeat ultrasound and possible referral to vascular

## 2019-09-22 NOTE — Assessment & Plan Note (Addendum)
-   Clincally improving. Off oxygen, continues to have moderate dyspnea with extended periods of exertion  - CTA 07/26/18 showed resolved PE, diffuse parenchymal haziness in both lungs has improved but not resolved - We will submit paper work for long term disbility to [employer]  - Maintained O2 >90% RA on ambulatory walk, ok to discontinue oxygen  - Repeat HRCT in 6 months and FU with Dr. Chase Caller

## 2019-09-24 ENCOUNTER — Other Ambulatory Visit: Payer: Self-pay

## 2019-09-24 ENCOUNTER — Encounter: Payer: Self-pay | Admitting: Nurse Practitioner

## 2019-09-24 ENCOUNTER — Ambulatory Visit: Payer: PRIVATE HEALTH INSURANCE | Admitting: Nurse Practitioner

## 2019-09-24 VITALS — BP 118/78 | HR 78 | Temp 97.6°F | Ht 66.0 in | Wt 218.0 lb

## 2019-09-24 DIAGNOSIS — R7309 Other abnormal glucose: Secondary | ICD-10-CM

## 2019-09-24 DIAGNOSIS — I825Y2 Chronic embolism and thrombosis of unspecified deep veins of left proximal lower extremity: Secondary | ICD-10-CM

## 2019-09-24 DIAGNOSIS — Z86711 Personal history of pulmonary embolism: Secondary | ICD-10-CM | POA: Insufficient documentation

## 2019-09-24 DIAGNOSIS — I10 Essential (primary) hypertension: Secondary | ICD-10-CM

## 2019-09-24 DIAGNOSIS — E782 Mixed hyperlipidemia: Secondary | ICD-10-CM

## 2019-09-24 NOTE — Telephone Encounter (Signed)
Beth, please advise if the form was able to be taken care of when pt had OV with you 4/30 and if paperwork was returned back to [place] after the [place].

## 2019-09-24 NOTE — Telephone Encounter (Signed)
Yes I have them, will given them to Tamara/Patrice

## 2019-09-24 NOTE — Progress Notes (Signed)
This visit occurred during the SARS-CoV-2 public health emergency.  Safety protocols were in place, including screening questions prior to the visit, additional usage of staff PPE, and extensive cleaning of exam room while observing appropriate contact time as indicated for disinfecting solutions.  Subjective:     Patient ID: [name] , male    DOB: 11/28/1955 , 64 y.o.   MRN: [id number]   Chief Complaint  Patient presents with  . Hypertension    HPI  He is feeling better. Continues to be see the Pulmonologist seen on Friday.  Concerned about chronic infiltrates unsure is fibrosis or damage from the Covid pneumonia.  When he is having long conversations will get winded.  Has not used his oxygen for over a month. Doing exercises from physical therapy.  Going up stairs or incline is challenging.    INR was 3.4 last week.  Coumadin 51m daily. His last vascular ultrasound was in March 2021 recommend 3-6 months anticoagulation.      Hypertension This is a chronic problem. The current episode started more than 1 year ago. The problem is unchanged. The problem is controlled. Pertinent negatives include no anxiety, chest pain, headaches or palpitations. There are no associated agents to hypertension. Risk factors for coronary artery disease include obesity and male gender. There are no compliance problems.  There is no history of angina.     Past Medical History:  Diagnosis Date  . [place]   . GERD (gastroesophageal reflux disease)   . Hx of right BKA [place]    uses prosthetic   . Hyperlipidemia   . Hypertension   . Liver disorder    tx with ursodiol      Family History  Problem Relation Age of Onset  . Colon polyps Father   . Colon cancer Neg Hx   . Rectal cancer Neg Hx   . Stomach cancer Neg Hx      Current Outpatient Medications:  .  acetaminophen (TYLENOL) 500 MG tablet, Take 500 mg by mouth every 6 (six) hours as needed for mild  pain or fever. , Disp: , Rfl:  .  Alcohol Swabs (ALCOHOL WIPES) 70 % PADS, Use to wipe skin to check CBG, Disp: 100 each, Rfl: 0 .  Ascorbic Acid (VITAMIN C) 1000 MG tablet, Take 1,000 mg by mouth daily., Disp: , Rfl:  .  aspirin 81 MG chewable tablet, Chew 81 mg by mouth daily., Disp: , Rfl:  .  atorvastatin (LIPITOR) 20 MG tablet, TAKE 2 TABLETS BY MOUTH AT BEDTIME., Disp: 90 tablet, Rfl: 0 .  blood glucose meter kit and supplies KIT, Dispense based on patient and insurance preference. Use up to four times daily as directed. (FOR ICD-9 250.00, 250.01)., Disp: 1 each, Rfl: 0 .  famotidine (PEPCID) 20 MG tablet, Take 40 mg by mouth as needed. , Disp: , Rfl:  .  Omega-3 Fatty Acids (FISH OIL PO), Take by mouth daily., Disp: , Rfl:  .  tamsulosin (FLOMAX) 0.4 MG CAPS capsule, TAKE 1 CAPSULE BY MOUTH DAILY AFTER SUPPER., Disp: 30 capsule, Rfl: 0 .  ursodiol (ACTIGALL) 250 MG tablet, TAKE TWO TABLETS BY MOUTH DAILY, Disp: 180 tablet, Rfl: 1 .  valsartan-hydrochlorothiazide (DIOVAN-HCT) 320-25 MG tablet, TAKE 1 TABLET BY MOUTH DAILY., Disp: 90 tablet, Rfl: 0 .  VITAMIN D PO, Take 5,000 Units by mouth daily., Disp: , Rfl:  .  warfarin (COUMADIN) 1 MG tablet, Take 1 tablet (1 mg total) by mouth  daily., Disp: 30 tablet, Rfl: 11 .  warfarin (COUMADIN) 2 MG tablet, TAKE 2 TABLETS BY MOUTH DAILY. DOSE TO BE ADJUSTED AFTER INR CHECK, Disp: 60 tablet, Rfl: 2   No Known Allergies   Review of Systems  Constitutional: Negative.  Negative for chills.  Respiratory: Negative.   Cardiovascular: Negative for chest pain, palpitations and leg swelling.  Neurological: Negative for dizziness and headaches.  Psychiatric/Behavioral: Negative.     Today's Vitals   09/24/19 0916  BP: 118/78  Pulse: 78  Temp: 97.6 F (36.4 C)  TempSrc: Oral  Weight: 218 lb (98.9 kg)  Height: _0  (1.676 m)  PainSc: 0-No pain   Body mass index is 35.19 kg/m.   Objective:  Physical Exam Vitals reviewed.  Constitutional:       General: He is not in acute distress.    Appearance: Normal appearance.  Cardiovascular:     Rate and Rhythm: Normal rate and regular rhythm.     Pulses: Normal pulses.     Heart sounds: Normal heart sounds. No murmur.  Pulmonary:     Effort: Pulmonary effort is normal. No respiratory distress.     Breath sounds: Normal breath sounds. No wheezing.     Comments: Not currently using his oxygen Musculoskeletal:     Comments: Right AKA  Skin:    General: Skin is warm.     Capillary Refill: Capillary refill takes less than 2 seconds.  Neurological:     General: No focal deficit present.     Mental Status: He is alert and oriented to person, place, and time.     Cranial Nerves: No cranial nerve deficit.  Psychiatric:        Mood and Affect: Mood normal.        Behavior: Behavior normal.        Thought Content: Thought content normal.        Judgment: Judgment normal.         Assessment And Plan:     1. Essential hypertension  Chronic, great control  Continue with current medications  2. Mixed hyperlipidemia  Chronic, stable.   Continue with medicatoins  3. History of pulmonary embolism  Continues to have some intermittent dyspnea with exertion with intermittent use of oxygen.   At this time may not do well with going back to work expected return date 01/23/2020  4. Chronic deep vein thrombosis (DVT) of proximal vein of left lower extremity (HCC)  His INR was 3.4 last week, he is to hold his coumadin today and take 4 mg daily and recheck in one week.        [name], FNP    THE PATIENT IS ENCOURAGED TO PRACTICE SOCIAL DISTANCING DUE TO THE COVID-19 PANDEMIC.

## 2019-09-25 ENCOUNTER — Telehealth: Payer: Self-pay | Admitting: Internal Medicine

## 2019-09-25 LAB — CMP14+EGFR
ALT: 15 IU/L (ref 0–44)
AST: 19 IU/L (ref 0–40)
Albumin/Globulin Ratio: 1.4 (ref 1.2–2.2)
Albumin: 4.2 g/dL (ref 3.8–4.8)
Alkaline Phosphatase: 88 IU/L (ref 39–117)
BUN/Creatinine Ratio: 15 (ref 10–24)
BUN: 15 mg/dL (ref 8–27)
Bilirubin Total: 0.7 mg/dL (ref 0.0–1.2)
CO2: 24 mmol/L (ref 20–29)
Calcium: 9.6 mg/dL (ref 8.6–10.2)
Chloride: 102 mmol/L (ref 96–106)
Creatinine, Ser: 1.03 mg/dL (ref 0.76–1.27)
GFR calc Af Amer: 89 mL/min/{1.73_m2} (ref >59)
GFR calc non Af Amer: 77 mL/min/{1.73_m2} (ref >59)
Globulin, Total: 2.9 g/dL (ref 1.5–4.5)
Glucose: 113 mg/dL — ABNORMAL HIGH (ref 65–99)
Potassium: 4.6 mmol/L (ref 3.5–5.2)
Sodium: 143 mmol/L (ref 134–144)
Total Protein: 7.1 g/dL (ref 6.0–8.5)

## 2019-09-25 LAB — HEMOGLOBIN A1C
Est. average glucose Bld gHb Est-mCnc: 148 mg/dL
Hgb A1c MFr Bld: 6.8 % — ABNORMAL HIGH (ref 4.8–5.6)

## 2019-09-25 NOTE — Telephone Encounter (Signed)
Rec'd completed paperwork back - fwd to [employer] via interoffice mail -pr  °

## 2019-09-25 NOTE — Telephone Encounter (Signed)
Forms were given to [facility] by Eustaquio Maize. Nothing further needed.

## 2019-09-25 NOTE — Telephone Encounter (Signed)
Left message for [facility]. Per patient's chart, it appears the forms were faxed back to [employer] this morning around 1030am.

## 2019-09-26 NOTE — Telephone Encounter (Signed)
Spoke with Vaughan Basta and advised the forms were sent by interoffice yesterday 09/25/19 by Sharl Ma  She verbalized understanding  Will call back if not received for some reason

## 2019-09-28 NOTE — Telephone Encounter (Signed)
email from patient today   Yes. The range is 94 on room air at rest. The lowest has been 88 on room air with exercise.  Beth please advise if we can place order to discontinue oxygen

## 2019-09-28 NOTE — Telephone Encounter (Signed)
Yes we can discontinue oxygen

## 2019-09-29 DIAGNOSIS — Z7901 Long term (current) use of anticoagulants: Secondary | ICD-10-CM | POA: Diagnosis not present

## 2019-09-29 LAB — POCT INR: INR: 1.8 — AB (ref 0.9–1.1)

## 2019-10-01 ENCOUNTER — Encounter: Payer: Self-pay | Admitting: Nurse Practitioner

## 2019-10-02 ENCOUNTER — Encounter: Payer: Self-pay | Admitting: Nurse Practitioner

## 2019-10-02 NOTE — Progress Notes (Signed)
See lab note.  

## 2019-10-04 DIAGNOSIS — J9611 Chronic respiratory failure with hypoxia: Secondary | ICD-10-CM

## 2019-10-05 DIAGNOSIS — Z7901 Long term (current) use of anticoagulants: Secondary | ICD-10-CM

## 2019-10-05 LAB — POCT INR: INR: 2.1 — AB (ref 0.9–1.1)

## 2019-10-05 NOTE — Telephone Encounter (Signed)
Beth please advise on patient stopping oxygen  Your Last OV: Assessment & Plan:   Pneumonia due to COVID-19 virus - Clincally improving. Off oxygen, continues to have moderate dyspnea with extended periods of exertion  - CTA 07/26/18 showed resolved PE, diffuse parenchymal haziness in both lungs has improved but not resolved - We will submit paper work for long term disbility to [employer]  - Maintained O2 >90% RA on ambulatory walk, ok to discontinue oxygen  - Repeat HRCT in 6 months and FU with Dr. Chase Caller

## 2019-10-05 NOTE — Telephone Encounter (Signed)
We can discontinue oxygen, please send order

## 2019-10-08 ENCOUNTER — Encounter: Payer: Self-pay | Admitting: Nurse Practitioner

## 2019-10-09 ENCOUNTER — Other Ambulatory Visit: Payer: Self-pay | Admitting: Nurse Practitioner

## 2019-10-09 DIAGNOSIS — E782 Mixed hyperlipidemia: Secondary | ICD-10-CM

## 2019-10-12 DIAGNOSIS — Z7901 Long term (current) use of anticoagulants: Secondary | ICD-10-CM

## 2019-10-12 LAB — POCT INR: INR: 2.4 — AB (ref 0.9–1.1)

## 2019-10-13 ENCOUNTER — Encounter: Payer: Self-pay | Admitting: Nurse Practitioner

## 2019-10-15 ENCOUNTER — Other Ambulatory Visit: Payer: Self-pay | Admitting: Nurse Practitioner

## 2019-10-19 ENCOUNTER — Encounter: Payer: Self-pay | Admitting: Nurse Practitioner

## 2019-10-19 DIAGNOSIS — Z7901 Long term (current) use of anticoagulants: Secondary | ICD-10-CM

## 2019-10-19 LAB — POCT INR: INR: 2.7 — AB (ref 0.9–1.1)

## 2019-10-23 ENCOUNTER — Encounter: Payer: Self-pay | Admitting: Nurse Practitioner

## 2019-10-26 ENCOUNTER — Encounter: Payer: Self-pay | Admitting: Nurse Practitioner

## 2019-10-26 DIAGNOSIS — Z7901 Long term (current) use of anticoagulants: Secondary | ICD-10-CM

## 2019-10-26 LAB — POCT INR: INR: 2.5 — AB (ref 0.9–1.1)

## 2019-10-29 ENCOUNTER — Encounter: Payer: Self-pay | Admitting: Nurse Practitioner

## 2019-11-01 ENCOUNTER — Encounter: Payer: Self-pay | Admitting: Adult Health

## 2019-11-01 ENCOUNTER — Ambulatory Visit: Payer: PRIVATE HEALTH INSURANCE | Admitting: Adult Health

## 2019-11-01 NOTE — Progress Notes (Deleted)
[facility] [address] [place]. [place] [address] [phone]       STROKE FOLLOW UP NOTE  [name]. [name] Date of Birth:  25-Aug-1955 Medical Record Number:  [id number]   Reason for Referral: stroke follow up    CHIEF COMPLAINT:  No chief complaint on file.   HPI:  Today, 11/01/19, [name] is being seen for follow-up regarding left MCA stroke in 04/2019.  He has been stable from a stroke standpoint since prior visit with residual ***.  Denies new or worsening stroke/TIA symptoms.  Continues to follow closely with pulmonology post Covid with recent CTA 07/2018 showing resolution of PE.  Recommended to continue warfarin for additional 3 to 6 months per cardiology recommendations for chronic left DVT.  He has continued on warfarin without bleeding or bruising with stable INR levels.  Continues on atorvastatin without myalgias.  Blood pressure today ***.  No concerns at this time.   History provided for reference purposes only Initial visit 06/27/2019 JM: [name] is a 64 year old right-handed male who is being seen today for hospital follow up.  He has recovered greatly overall with minimal residual stroke deficits right hand mild decreased dexterity that is only evident while writing or typing.  He has been slowly returning to prior activities but has been limited due to respiratory status.  Continues to use O2 with ambulation but is not currently needed at rest.  He has not returned back to work at this time working as a Surveyor, minerals at [employer] currently receiving disability through pulmonology.  He is hopeful to return around 09/2019.  Recent follow-up with pulmonology yesterday and plans on having a follow-up appointment scheduled with hematology in the near future.  He has continued on warfarin and aspirin without bleeding or bruising.  INR levels have been stable and continues to be monitored by PCP.  Continues on atorvastatin without myalgias.   Blood pressure today 126/86.  Denies new or worsening stroke/TIA symptoms.  No further concerns at this time.  Stroke admission 05/02/2019: [name] is a 64 y.o. 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 12/9 for evaluation of a subacute L MCA territory infarct. He had new onset R hand weakness 1 week ago, that has improved. He is on xarelto, aspirin and statin.  Evaluated by stroke team and Dr. Leonie Man with recent left MCA infarcts possibly due to hypercoagulable state in setting of COVID-19 with PE and DVT despite being on anticoagulation with Xarelto.  CTA head/neck no significant large vessel stenosis or occlusion or arthrosclerotic disease.  Carotid Dopplers unremarkable.  TCD with bubble was negative.  2D echo showed an EF of 60 to 65% without cardiac source of embolus identified.  Lower extremity Doppler showed left DVT posterior tibial, left peroneal veins with new clot progression now extending to left peroneal with prior left posterior tib.  Felt LLE DVT and PE due to hypercoagulable state during Covid infection and recommended considering change of anticoagulation from Xarelto to either full dose Lovenox) with Lovenox bridging with further recommendations by hematology.  Consult by hematology who recommended transition to Coumadin with heparin bridge along with continuation of aspirin 81 mg daily.  History of HTN stable.  LDL 54 and  recommend continuation of atorvastatin 40 mg daily.  Controlled DM with A1c 6.8.  Other stroke risk factors include obesity and history of right BKA but no prior history of stroke.  Other active problems include AKI resolved and BPH on Flomax.  Residual deficits include diminished right fine finger movements and mild grip weakness.   Due to continued desats with ambulation, he was discharged home on home O2 along with continuation of prednisone taper and follow-up as recommended on 05/07/2019.     ROS:   14 system review of systems performed and negative with exception of decreased right hand dexterity, dyspnea on exertion  PMH:  Past Medical History:  Diagnosis Date  . Diabetes mellitus without complication ([place])   . GERD (gastroesophageal reflux disease)   . Hx of right BKA ([place])    uses prosthetic   . Hyperlipidemia   . Hypertension   . Liver disorder    tx with ursodiol     PSH:  Past Surgical History:  Procedure Laterality Date  . BELOW KNEE LEG AMPUTATION Right   . CHOLECYSTECTOMY    . WISDOM TOOTH EXTRACTION      Social History:  Social History   Socioeconomic History  . Marital status: Married    Spouse name: Not on file  . Number of children: 1  . Years of education: Not on file  . Highest education level: Not on file  Occupational History  . Not on file  Tobacco Use  . Smoking status: Never Smoker  . Smokeless tobacco: Never Used  Vaping Use  . Vaping Use: Never used  Substance and Sexual Activity  . Alcohol use: No  . Drug use: No  . Sexual activity: Not on file  Other Topics Concern  . Not on file  Social History Narrative  . Not on file   Social Determinants of Health   Financial Resource Strain:   . Difficulty of Paying Living Expenses:   Food Insecurity:   . Worried About Charity fundraiser in the Last Year:   . Arboriculturist in the Last Year:   Transportation Needs:   . Film/video editor (Medical):   Marland Kitchen Lack of Transportation (Non-Medical):   Physical Activity:   . Days of Exercise per Week:   . Minutes of Exercise per Session:   Stress:   . Feeling of Stress :   Social Connections:   . Frequency of Communication with Friends and Family:   . Frequency of Social Gatherings with Friends and Family:   . Attends Religious Services:   . Active Member of  Clubs or Organizations:   . Attends Archivist Meetings:   Marland Kitchen Marital Status:   Intimate Partner Violence:   . Fear of Current or Ex-Partner:   . Emotionally Abused:   Marland Kitchen Physically Abused:   . Sexually Abused:     Family History:  Family History  Problem Relation Age of Onset  . Colon polyps Father   . Colon cancer Neg Hx   . Rectal cancer Neg Hx   . Stomach cancer Neg Hx     Medications:   Current Outpatient Medications on File Prior to Visit  Medication Sig Dispense Refill  . acetaminophen (TYLENOL) 500 MG tablet Take 500 mg by mouth every 6 (six) hours as needed for mild pain or fever.     . Alcohol Swabs (ALCOHOL WIPES) 70 % PADS Use to wipe skin to check CBG 100 each 0  . Ascorbic  Acid (VITAMIN C) 1000 MG tablet Take 1,000 mg by mouth daily.    Marland Kitchen aspirin 81 MG chewable tablet Chew 81 mg by mouth daily.    Marland Kitchen atorvastatin (LIPITOR) 20 MG tablet TAKE 2 TABLETS BY MOUTH AT BEDTIME. 90 tablet 0  . blood glucose meter kit and supplies KIT Dispense based on patient and insurance preference. Use up to four times daily as directed. (FOR ICD-9 250.00, 250.01). 1 each 0  . famotidine (PEPCID) 20 MG tablet Take 40 mg by mouth as needed.     . Omega-3 Fatty Acids (FISH OIL PO) Take by mouth daily.    . tamsulosin (FLOMAX) 0.4 MG CAPS capsule TAKE 1 CAPSULE BY MOUTH DAILY AFTER SUPPER. 30 capsule 0  . ursodiol (ACTIGALL) 250 MG tablet TAKE TWO TABLETS BY MOUTH DAILY 180 tablet 1  . valsartan-hydrochlorothiazide (DIOVAN-HCT) 320-25 MG tablet TAKE 1 TABLET BY MOUTH DAILY. 90 tablet 0  . VITAMIN D PO Take 5,000 Units by mouth daily.    Marland Kitchen warfarin (COUMADIN) 1 MG tablet Take 1 tablet (1 mg total) by mouth daily. 30 tablet 11  . warfarin (COUMADIN) 2 MG tablet TAKE 2 TABLETS BY MOUTH DAILY. DOSE TO BE ADJUSTED AFTER INR CHECK 60 tablet 2   No current facility-administered medications on file prior to visit.    Allergies:  No Known Allergies   Physical Exam  There were no  vitals filed for this visit. There is no height or weight on file to calculate BMI. No exam data present   General: well developed, well nourished, very pleasant middle-aged African-American male, seated, in no evident distress Head: head normocephalic and atraumatic.   Neck: supple with no carotid or supraclavicular bruits Cardiovascular: regular rate and rhythm, no murmurs Musculoskeletal: no deformity; left BKA with prosthetic in place Skin:  no rash/petichiae Vascular:  Normal pulses all extremities   Neurologic Exam Mental Status: Awake and fully alert.   Normal speech and language.  Oriented to place and time. Recent and remote memory intact. Attention span, concentration and fund of knowledge appropriate. Mood and affect appropriate.  Cranial Nerves: Pupils equal, briskly reactive to light. Extraocular movements full without nystagmus. Visual fields full to confrontation. Hearing intact. Facial sensation intact. Face, tongue, palate moves normally and symmetrically.  Motor: Normal bulk and tone. Normal strength in all tested extremity muscles.  Possible very fine right hand motor deficit Sensory.: intact to touch , pinprick , position and vibratory sensation.  Coordination: Rapid alternating movements normal in all extremities except very mild right hand dexterity. Finger-to-nose and heel-to-shin performed accurately bilaterally. Gait and Station: Arises from chair without difficulty. Stance is normal. Gait demonstrates  slight abnormality due to left BKA but overall stable currently using prosthetic Reflexes: 1+ and symmetric. Toes downgoing.         ASSESSMENT: [name] is a 64 y.o. year old male with hospitalization at [facility] for Covid pneumonia on 04/09/2019 with respiratory failure, PE, LLE DVT and possible bacterial pneumonia found to have new onset right hand weakness with evidence of left MCA territory infarct and was transferred to [facility] for further evaluation and  management. L MCA stroke possibly from hypercoagulable state in setting of COVID 19 w/ PE and DVT despite being on anticoagulation with Xarelto.   Vascular risk factors include HTN, HLD, DM2, stage 1 CKD and right BKA.  Greatly recovered from a stroke standpoint with only minimal residual decreased right hand dexterity    PLAN:  1. L MCA stroke :  Continue aspirin 81 mg daily and warfarin daily  and lipitor  for secondary stroke prevention. Maintain strict control of hypertension with blood pressure goal below 130/90, diabetes with hemoglobin A1c goal below 6.5% and cholesterol with LDL cholesterol (bad cholesterol) goal below 70 mg/dL.  I also advised the patient to eat a healthy diet with plenty of whole grains, cereals, fruits and vegetables, exercise regularly with at least 30 minutes of continuous activity daily and maintain ideal body weight. 2. HTN: Advised to continue current treatment regimen.  Today's BP stable.  Advised to continue to monitor at home along with continued follow-up with PCP for management 3. HLD: Advised to continue current treatment regimen along with continued follow-up with PCP for future prescribing and monitoring of lipid panel 4. DMII: Advised to continue to monitor glucose levels at home along with continued follow-up with PCP for management and monitoring 5. DVT and PE post Covid: Continues to be followed by pulmonology.  CTA 07/2018 showed resolution of PE.  Remains on warfarin for chronic left DVT with recommendation of additional 3 to 6 months per cardiology    Follow up in 4 months or call earlier if needed   Greater than 50% of time during this 45 minute visit was spent on counseling, explanation of diagnosis of left MCA stroke, reviewing risk factor management of HTN, HLD, DM, discussion regarding stroke etiology due to hypercoagulable state of covid and to continue to follow with recommended providers, planning of further management along with potential future  management, and discussion with patient and answering all questions to patient satisfaction    Frann Rider, AGNP-BC  [facility] [address] [place],  [address]  Phone [phone] Fax [phone] Note: This document was prepared with digital dictation and possible smart phrase technology. Any transcriptional errors that result from this process are unintentional.

## 2019-11-02 ENCOUNTER — Encounter: Payer: Self-pay | Admitting: Nurse Practitioner

## 2019-11-02 DIAGNOSIS — Z7901 Long term (current) use of anticoagulants: Secondary | ICD-10-CM | POA: Diagnosis not present

## 2019-11-02 LAB — POCT INR: INR: 1.8 — AB (ref 0.9–1.1)

## 2019-11-07 ENCOUNTER — Encounter: Payer: Self-pay | Admitting: Nurse Practitioner

## 2019-11-07 DIAGNOSIS — Z7901 Long term (current) use of anticoagulants: Secondary | ICD-10-CM

## 2019-11-07 LAB — POCT INR: INR: 2.4 — AB (ref 0.9–1.1)

## 2019-11-13 ENCOUNTER — Ambulatory Visit: Payer: PRIVATE HEALTH INSURANCE | Admitting: Adult Health

## 2019-11-13 ENCOUNTER — Other Ambulatory Visit: Payer: Self-pay

## 2019-11-13 ENCOUNTER — Encounter: Payer: Self-pay | Admitting: Adult Health

## 2019-11-13 VITALS — BP 116/66 | Ht 65.5 in | Wt 223.0 lb

## 2019-11-13 DIAGNOSIS — E118 Type 2 diabetes mellitus with unspecified complications: Secondary | ICD-10-CM

## 2019-11-13 DIAGNOSIS — U071 COVID-19: Secondary | ICD-10-CM | POA: Diagnosis not present

## 2019-11-13 DIAGNOSIS — I63512 Cerebral infarction due to unspecified occlusion or stenosis of left middle cerebral artery: Secondary | ICD-10-CM

## 2019-11-13 DIAGNOSIS — I639 Cerebral infarction, unspecified: Secondary | ICD-10-CM

## 2019-11-13 DIAGNOSIS — I10 Essential (primary) hypertension: Secondary | ICD-10-CM | POA: Diagnosis not present

## 2019-11-13 DIAGNOSIS — E785 Hyperlipidemia, unspecified: Secondary | ICD-10-CM

## 2019-11-13 NOTE — Patient Instructions (Signed)
Continue warfarin daily  and lipitor  for secondary stroke prevention  Continue to follow up with PCP regarding cholesterol and blood pressure management   Continue to follow with pulmonology for ongoing warfarin use and recommendations regarding ongoing duration  Continue to monitor blood pressure at home  Maintain strict control of hypertension with blood pressure goal below 130/90, diabetes with hemoglobin A1c goal below 6.5% and cholesterol with LDL cholesterol (bad cholesterol) goal below 70 mg/dL. I also advised the patient to eat a healthy diet with plenty of whole grains, cereals, fruits and vegetables, exercise regularly and maintain ideal body weight.         Thank you for coming to see [place] at [facility]. I hope we have been able to provide you high quality care today.  You may receive a patient satisfaction survey over the next few weeks. We would appreciate your feedback and comments so that we may continue to improve ourselves and the health of our patients.

## 2019-11-13 NOTE — Progress Notes (Signed)
[facility] [address] [place]. [place] [address] [phone]       STROKE FOLLOW UP NOTE  [name]. [name] Date of Birth:  12/13/1955 Medical Record Number:  [id number]   Reason for Referral: stroke follow up    CHIEF COMPLAINT:  Chief Complaint  Patient presents with  . Follow-up    tx rm here for a stroke. No new sx.    HPI:  Today, 11/13/19, [name] is being seen for follow-up regarding left MCA stroke in 04/2019.  He has been stable from a stroke standpoint with near resolution of deficits - he does report occasional "funny feeling" on the inside of right middle finger but denies residual weakness. Denies new stroke/TIA symptoms.  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  He is greatly looking forward to this.  Continues to follow closely with pulmonology post Covid with recent CTA 07/2018 showing resolution of PE.  He has continued on warfarin and recommended for additional 3 to 6 months per pulmonology for left DVT.  He has continued on aspirin 81 mg daily and warfarin without bleeding or bruising with stable INR levels in which he self monitors at home. Continues on atorvastatin 62m daily without myalgias.  Blood pressure today 116/66.  No concerns at this time.   History provided for reference purposes only Initial visit 06/27/2019 JM: [name] BEckelsis a 64year old right-handed male who is being seen today for hospital follow up.  He has recovered greatly overall with minimal residual stroke deficits right hand mild decreased dexterity that is only evident while writing or typing.  He has been slowly returning to prior activities but has been limited due to respiratory status.  Continues to use O2 with ambulation but is not  currently needed at rest.  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  He is hopeful to return around 09/2019.  Recent follow-up with pulmonology yesterday and plans on having a follow-up appointment scheduled with hematology in the near future.  He has continued on warfarin and aspirin without bleeding or bruising.  INR levels have been stable and continues to be monitored by PCP.  Continues on atorvastatin without myalgias.  Blood pressure today 126/86.  Denies new or worsening stroke/TIA symptoms.  No further concerns at this time.  Stroke admission 05/02/2019: [name]. [name] GREENLEAFis a 64y.o. 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 He had new onset R hand weakness 1 week ago, that has improved. He is on xarelto, aspirin and statin.  Evaluated by stroke team and Dr. SLeonie Manwith recent left MCA infarcts possibly due to hypercoagulable state in setting of COVID-19 with PE and DVT despite being on anticoagulation with Xarelto.  CTA head/neck no significant large vessel stenosis or occlusion or arthrosclerotic disease.  Carotid Dopplers unremarkable.  TCD with bubble was negative.  2D  echo showed an EF of 60 to 65% without cardiac source of embolus identified.  Lower extremity Doppler showed left DVT posterior tibial, left peroneal veins with new clot progression now extending to left peroneal with prior left posterior tib.  Felt LLE DVT and PE due to hypercoagulable state during Covid infection and recommended considering change of anticoagulation from Xarelto to either full dose Lovenox) with Lovenox bridging with  further recommendations by hematology.  Consult by hematology who recommended transition to Coumadin with heparin bridge along with continuation of aspirin 81 mg daily.  History of HTN stable.  LDL 54 and recommend continuation of atorvastatin 40 mg daily.  Controlled DM with A1c 6.8.  Other stroke risk factors include obesity and history of right BKA but no prior history of stroke.  Other active problems include AKI resolved and BPH on Flomax.  Residual deficits include diminished right fine finger movements and mild grip weakness.  Due to continued desats with ambulation, he was discharged home on home O2 along with continuation of prednisone taper and follow-up as recommended on 05/07/2019.     ROS:   14 system review of systems performed and negative with exception of occasional numbness/tingling   PMH:  Past Medical History:  Diagnosis Date  . Diabetes mellitus without complication ([place])   . GERD (gastroesophageal reflux disease)   . Hx of right BKA ([place])    uses prosthetic   . Hyperlipidemia   . Hypertension   . Liver disorder    tx with ursodiol     PSH:  Past Surgical History:  Procedure Laterality Date  . BELOW KNEE LEG AMPUTATION Right   . CHOLECYSTECTOMY    . WISDOM TOOTH EXTRACTION      Social History:  Social History   Socioeconomic History  . Marital status: Married    Spouse name: Not on file  . Number of children: 1  . Years of education: Not on file  . Highest education level: Not on file  Occupational History  . Not on file  Tobacco Use  . Smoking status: Never Smoker  . Smokeless tobacco: Never Used  Vaping Use  . Vaping Use: Never used  Substance and Sexual Activity  . Alcohol use: No  . Drug use: No  . Sexual activity: Not on file  Other Topics Concern  . Not on file  Social History Narrative  . Not on file   Social Determinants of Health   Financial Resource Strain:   . Difficulty of Paying Living Expenses:   Food Insecurity:   .  Worried About Charity fundraiser in the Last Year:   . Arboriculturist in the Last Year:   Transportation Needs:   . Film/video editor (Medical):   Marland Kitchen Lack of Transportation (Non-Medical):   Physical Activity:   . Days of Exercise per Week:   . Minutes of Exercise per Session:   Stress:   . Feeling of Stress :   Social Connections:   . Frequency of Communication with Friends and Family:   . Frequency of Social Gatherings with Friends and Family:   . Attends Religious Services:   . Active Member of Clubs or Organizations:   . Attends Archivist Meetings:   Marland Kitchen Marital Status:   Intimate Partner Violence:   . Fear of Current or Ex-Partner:   . Emotionally Abused:   Marland Kitchen Physically Abused:   . Sexually Abused:     Family History:  Family History  Problem Relation Age of Onset  . Colon polyps Father   . Colon cancer Neg Hx   . Rectal cancer Neg Hx   . Stomach cancer Neg Hx     Medications:   Current Outpatient Medications on File Prior to Visit  Medication Sig Dispense Refill  . acetaminophen (TYLENOL) 500 MG tablet Take 500 mg by mouth every 6 (six) hours as needed for mild pain or fever.     . Alcohol Swabs (ALCOHOL WIPES) 70 % PADS Use to wipe skin to check CBG 100 each 0  . Ascorbic Acid (VITAMIN C) 1000 MG tablet Take 1,000 mg by mouth daily.    Marland Kitchen aspirin 81 MG chewable tablet Chew 81 mg by mouth daily.    Marland Kitchen atorvastatin (LIPITOR) 20 MG tablet TAKE 2 TABLETS BY MOUTH AT BEDTIME. 90 tablet 0  . blood glucose meter kit and supplies KIT Dispense based on patient and insurance preference. Use up to four times daily as directed. (FOR ICD-9 250.00, 250.01). 1 each 0  . famotidine (PEPCID) 20 MG tablet Take 40 mg by mouth as needed.     . Omega-3 Fatty Acids (FISH OIL PO) Take by mouth daily.    . tamsulosin (FLOMAX) 0.4 MG CAPS capsule TAKE 1 CAPSULE BY MOUTH DAILY AFTER SUPPER. 30 capsule 0  . ursodiol (ACTIGALL) 250 MG tablet TAKE TWO TABLETS BY MOUTH DAILY 180  tablet 1  . valsartan-hydrochlorothiazide (DIOVAN-HCT) 320-25 MG tablet TAKE 1 TABLET BY MOUTH DAILY. 90 tablet 0  . VITAMIN D PO Take 5,000 Units by mouth daily.    Marland Kitchen warfarin (COUMADIN) 1 MG tablet Take 1 tablet (1 mg total) by mouth daily. 30 tablet 11  . warfarin (COUMADIN) 2 MG tablet TAKE 2 TABLETS BY MOUTH DAILY. DOSE TO BE ADJUSTED AFTER INR CHECK 60 tablet 2   No current facility-administered medications on file prior to visit.    Allergies:  No Known Allergies   Physical Exam  Vitals:   11/13/19 1111  BP: 116/66  Weight: 223 lb (101.2 kg)  Height: 5' 5.5" (1.664 m)   Body mass index is 36.54 kg/m. No exam data present   General: well developed, well nourished, very pleasant middle-aged African-American male, seated, in no evident distress Head: head normocephalic and atraumatic.   Neck: supple with no carotid or supraclavicular bruits Cardiovascular: regular rate and rhythm, no murmurs Musculoskeletal: no deformity; left BKA with prosthetic in place Skin:  no rash/petichiae Vascular:  Normal pulses all extremities   Neurologic Exam Mental Status: Awake and fully alert.  Normal speech and language. Oriented to place and time. Recent and remote memory intact. Attention span, concentration and fund of knowledge appropriate. Mood and affect appropriate.  Cranial Nerves: Pupils equal, briskly reactive to light. Extraocular movements full without nystagmus. Visual fields full to confrontation. Hearing intact. Facial sensation intact. Face, tongue, palate moves normally and symmetrically.  Motor: Normal bulk and tone. Normal strength in all tested extremity muscles. Sensory.: intact to touch , pinprick , position and vibratory sensation.  Coordination: Rapid alternating movements normal in all extremities.  Finger-to-nose and heel-to-shin performed accurately bilaterally. Gait and Station: Arises from chair without difficulty. Stance is normal. Gait demonstrates  slight  abnormality due to left BKA but overall stable currently using prosthetic Reflexes: 1+ and symmetric. Toes downgoing.         ASSESSMENT: [name] is a 64 y.o. year old male with [facility] on 04/09/2019 with respiratory failure,  PE, LLE DVT and possible bacterial pneumonia found to have new onset right hand weakness with evidence of left MCA territory infarct and was transferred to [facility] for further evaluation and management. L MCA stroke possibly from hypercoagulable state in setting of COVID 19 w/ PE and DVT despite being on anticoagulation with Xarelto.   Vascular risk factors include HTN, HLD, DM2, stage 1 CKD and right BKA.  Near full recovery from a stroke standpoint with only occasional subjective right middle finger numbness/tingling    PLAN:  1. L MCA stroke : Continue aspirin 81 mg daily and warfarin daily  and lipitor  for secondary stroke prevention. Maintain strict control of hypertension with blood pressure goal below 130/90, diabetes with hemoglobin A1c goal below 6.5% and cholesterol with LDL cholesterol (bad cholesterol) goal below 70 mg/dL.  I also advised the patient to eat a healthy diet with plenty of whole grains, cereals, fruits and vegetables, exercise regularly with at least 30 minutes of continuous activity daily and maintain ideal body weight. 2. HTN: Stable.  Continue to follow with PCP for monitoring management 3. HLD: Recent LDL stable.  Continuation of atorvastatin and continue to follow with PCP for prescribing, monitoring and management 4. DMII: Continue to follow with PCP for monitoring and management 5. DVT and PE post Covid: Continues to be followed by pulmonology.  CTA 07/2018 showed resolution of PE.  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  Warfarin use currently managed by pulmonology   Overall stable from stroke standpoint and as he  follows closely with PCP and pulmonology, he may follow-up as needed at this time but advised to call office with any stroke related questions or concerns  I spent 25 minutes of face-to-face and non-face-to-face time with patient.  This included previsit chart review, lab review, study review, order entry, electronic health record documentation, patient education   Frann Rider, [facility]  [facility] [address] [place], [place] [address]  Phone [phone] Fax [phone] Note: This document was prepared with digital dictation and possible smart phrase technology. Any transcriptional errors that result from this process are unintentional.

## 2019-11-13 NOTE — Progress Notes (Signed)
I agree with the above plan 

## 2019-11-14 ENCOUNTER — Encounter: Payer: Self-pay | Admitting: Nurse Practitioner

## 2019-11-14 ENCOUNTER — Ambulatory Visit: Payer: PRIVATE HEALTH INSURANCE | Admitting: Nurse Practitioner

## 2019-11-14 ENCOUNTER — Other Ambulatory Visit: Payer: Self-pay

## 2019-11-14 VITALS — BP 130/72 | HR 77 | Temp 98.0°F | Ht 66.0 in | Wt 221.6 lb

## 2019-11-14 DIAGNOSIS — Z86711 Personal history of pulmonary embolism: Secondary | ICD-10-CM | POA: Diagnosis not present

## 2019-11-14 DIAGNOSIS — R7309 Other abnormal glucose: Secondary | ICD-10-CM | POA: Diagnosis not present

## 2019-11-14 DIAGNOSIS — Z7901 Long term (current) use of anticoagulants: Secondary | ICD-10-CM | POA: Diagnosis not present

## 2019-11-14 DIAGNOSIS — Z8616 Personal history of COVID-19: Secondary | ICD-10-CM

## 2019-11-14 LAB — POCT INR: INR: 2.2 — AB (ref 0.9–1.1)

## 2019-11-14 NOTE — Progress Notes (Signed)
This visit occurred during the SARS-CoV-2 public health emergency.  Safety protocols were in place, including screening questions prior to the visit, additional usage of staff PPE, and extensive cleaning of exam room while observing appropriate contact time as indicated for disinfecting solutions.  Subjective:     Patient ID: [name] , male    DOB: [birth year] , 64 y.o.   MRN: [id number]   Chief Complaint  Patient presents with  . return to work    patient would like to discuss when he can go back to work.     HPI  Here to discuss returning to work.  He reports his breathing has been doing well. No longer requiring oxygen therapy. On Saturday he walked on the treadmill 1.2 miles with approximately 28% incline.  Oxygen level was 88-90%, took about 10-15 seconds recovery. He has been walking in his neighborhood about 1/2 mile with a little more hills. 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 They are open Mon - Friday 8am - 5pm.     Past Medical History:  Diagnosis Date  . Diabetes mellitus without complication ([place])   . GERD (gastroesophageal reflux disease)   . Hx of right BKA ([place])    uses prosthetic   . Hyperlipidemia   . Hypertension   . Liver disorder    tx with ursodiol      Family History  Problem Relation Age of Onset  . Colon polyps Father   . Colon cancer Neg Hx   . Rectal cancer Neg Hx   . Stomach cancer Neg Hx      Current Outpatient Medications:  .  acetaminophen (TYLENOL) 500 MG tablet, Take 500 mg by mouth every 6 (six) hours as needed for mild pain or fever. , Disp: , Rfl:  .  Alcohol Swabs (ALCOHOL WIPES) 70 % PADS, Use to wipe skin to check CBG, Disp: 100 each, Rfl: 0 .  Ascorbic Acid (VITAMIN C) 1000 MG tablet, Take 1,000 mg by mouth daily., Disp: , Rfl:  .  aspirin 81 MG chewable tablet, Chew 81 mg by mouth daily., Disp: , Rfl:  .   atorvastatin (LIPITOR) 20 MG tablet, TAKE 2 TABLETS BY MOUTH AT BEDTIME., Disp: 90 tablet, Rfl: 0 .  blood glucose meter kit and supplies KIT, Dispense based on patient and insurance preference. Use up to four times daily as directed. (FOR ICD-9 250.00, 250.01)., Disp: 1 each, Rfl: 0 .  famotidine (PEPCID) 20 MG tablet, Take 40 mg by mouth as needed. , Disp: , Rfl:  .  Omega-3 Fatty Acids (FISH OIL PO), Take by mouth daily., Disp: , Rfl:  .  tamsulosin (FLOMAX) 0.4 MG CAPS capsule, TAKE 1 CAPSULE BY MOUTH DAILY AFTER SUPPER., Disp: 30 capsule, Rfl: 0 .  ursodiol (ACTIGALL) 250 MG tablet, TAKE TWO TABLETS BY MOUTH DAILY, Disp: 180 tablet, Rfl: 1 .  valsartan-hydrochlorothiazide (DIOVAN-HCT) 320-25 MG tablet, TAKE 1 TABLET BY MOUTH DAILY., Disp: 90 tablet, Rfl: 0 .  VITAMIN D PO, Take 5,000 Units by mouth daily., Disp: , Rfl:  .  warfarin (COUMADIN) 1 MG tablet, Take 1 tablet (1 mg total) by mouth daily. (Patient taking differently: Take 1 mg by mouth daily. Take 19m on Friday and Sunday.), Disp: 30 tablet, Rfl: 11 .  warfarin (COUMADIN) 2 MG tablet, TAKE 2 TABLETS BY MOUTH DAILY. DOSE TO BE ADJUSTED AFTER INR CHECK, Disp:  60 tablet, Rfl: 2   No Known Allergies   Review of Systems  Constitutional: Negative for fatigue.  Respiratory: Negative.  Negative for cough, shortness of breath and wheezing.   Cardiovascular: Negative.  Negative for chest pain, palpitations and leg swelling.  Neurological: Negative for dizziness and headaches.  Psychiatric/Behavioral: Negative.      Today's Vitals   11/14/19 1033  BP: 130/72  Pulse: 77  Temp: 98 F (36.7 C)  TempSrc: Oral  Weight: 221 lb 9.6 oz (100.5 kg)  Height: 5' 6"  (1.676 m)  PainSc: 0-No pain   Body mass index is 35.77 kg/m.   Objective:  Physical Exam Constitutional:      General: He is not in acute distress.    Appearance: Normal appearance. He is obese.  Cardiovascular:     Rate and Rhythm: Normal rate.     Pulses: Normal pulses.      Heart sounds: Normal heart sounds. No murmur heard.   Pulmonary:     Effort: Pulmonary effort is normal. No respiratory distress.     Breath sounds: Normal breath sounds.  Skin:    Capillary Refill: Capillary refill takes less than 2 seconds.  Neurological:     General: No focal deficit present.     Mental Status: He is alert and oriented to person, place, and time.  Psychiatric:        Mood and Affect: Mood normal.        Behavior: Behavior normal.        Thought Content: Thought content normal.        Judgment: Judgment normal.         Assessment And Plan:     1. History of COVID-19  Discussed him returning to work, overall he is doing well  I would recommend he work 4 hours 3 days a week for 2 weeks, increase to 4 hours a day for 5 days a week then go to 8 hours a day 3 days a week for 2 weeks then 8 hours day 5 days weeks.   2. History of pulmonary embolism  4 mg daily except for Friday and Sunday takes 5 mg.  INR 2.4  3. Abnormal glucose  Blood sugars are stable  [name], FNP    THE PATIENT IS ENCOURAGED TO PRACTICE SOCIAL DISTANCING DUE TO THE COVID-19 PANDEMIC.

## 2019-11-15 ENCOUNTER — Encounter: Payer: Self-pay | Admitting: Nurse Practitioner

## 2019-11-15 ENCOUNTER — Telehealth: Payer: Self-pay

## 2019-11-15 NOTE — Telephone Encounter (Signed)
I called patient and notified him to continue his current dose of coumadin. YL,RMA

## 2019-11-19 ENCOUNTER — Other Ambulatory Visit: Payer: Self-pay | Admitting: Nurse Practitioner

## 2019-11-22 ENCOUNTER — Encounter: Payer: Self-pay | Admitting: Nurse Practitioner

## 2019-11-22 DIAGNOSIS — Z7901 Long term (current) use of anticoagulants: Secondary | ICD-10-CM | POA: Diagnosis not present

## 2019-11-22 LAB — POCT INR: INR: 2.7 — AB (ref 0.9–1.1)

## 2019-11-28 ENCOUNTER — Encounter: Payer: Self-pay | Admitting: Nurse Practitioner

## 2019-11-29 ENCOUNTER — Encounter: Payer: Self-pay | Admitting: Nurse Practitioner

## 2019-11-29 DIAGNOSIS — Z7901 Long term (current) use of anticoagulants: Secondary | ICD-10-CM | POA: Diagnosis not present

## 2019-11-29 LAB — POCT INR: INR: 2.6 — AB (ref 0.9–1.1)

## 2019-11-30 ENCOUNTER — Encounter: Payer: Self-pay | Admitting: Nurse Practitioner

## 2019-12-05 ENCOUNTER — Other Ambulatory Visit: Payer: Self-pay

## 2019-12-05 ENCOUNTER — Encounter: Payer: Self-pay | Admitting: Nurse Practitioner

## 2019-12-05 DIAGNOSIS — Z7901 Long term (current) use of anticoagulants: Secondary | ICD-10-CM

## 2019-12-05 LAB — POCT INR: INR: 2.2 — AB (ref 0.9–1.1)

## 2019-12-05 MED ORDER — WARFARIN SODIUM 2 MG PO TABS: ORAL_TABLET | ORAL | 2 refills | Status: DC

## 2019-12-10 ENCOUNTER — Encounter: Payer: Self-pay | Admitting: Nurse Practitioner

## 2019-12-12 ENCOUNTER — Encounter: Payer: Self-pay | Admitting: Nurse Practitioner

## 2019-12-12 ENCOUNTER — Other Ambulatory Visit: Payer: Self-pay

## 2019-12-12 DIAGNOSIS — E782 Mixed hyperlipidemia: Secondary | ICD-10-CM

## 2019-12-12 DIAGNOSIS — Z7901 Long term (current) use of anticoagulants: Secondary | ICD-10-CM

## 2019-12-12 LAB — POCT INR: INR: 2.6 — AB (ref 0.9–1.1)

## 2019-12-12 MED ORDER — ATORVASTATIN CALCIUM 20 MG PO TABS: 40.0000 mg | ORAL_TABLET | Freq: Every day | ORAL | 0 refills | Status: DC

## 2019-12-19 ENCOUNTER — Other Ambulatory Visit: Payer: Self-pay

## 2019-12-19 ENCOUNTER — Encounter: Payer: Self-pay | Admitting: Nurse Practitioner

## 2019-12-19 DIAGNOSIS — I2699 Other pulmonary embolism without acute cor pulmonale: Secondary | ICD-10-CM

## 2019-12-19 MED ORDER — WARFARIN SODIUM 1 MG PO TABS: 1.0000 mg | ORAL_TABLET | Freq: Every day | ORAL | 0 refills | Status: DC

## 2019-12-19 MED ORDER — TAMSULOSIN HCL 0.4 MG PO CAPS: ORAL_CAPSULE | ORAL | 0 refills | Status: DC

## 2019-12-20 DIAGNOSIS — Z7901 Long term (current) use of anticoagulants: Secondary | ICD-10-CM | POA: Diagnosis not present

## 2019-12-20 LAB — POCT INR: INR: 3 — AB (ref 0.9–1.1)

## 2019-12-25 ENCOUNTER — Encounter: Payer: Self-pay | Admitting: Nurse Practitioner

## 2019-12-26 ENCOUNTER — Encounter: Payer: Self-pay | Admitting: Nurse Practitioner

## 2019-12-26 DIAGNOSIS — Z7901 Long term (current) use of anticoagulants: Secondary | ICD-10-CM | POA: Diagnosis not present

## 2019-12-26 LAB — POCT INR: INR: 2.9 — AB (ref 0.9–1.1)

## 2019-12-30 ENCOUNTER — Other Ambulatory Visit: Payer: Self-pay | Admitting: Nurse Practitioner

## 2019-12-30 DIAGNOSIS — I2699 Other pulmonary embolism without acute cor pulmonale: Secondary | ICD-10-CM

## 2020-01-02 ENCOUNTER — Other Ambulatory Visit (HOSPITAL_COMMUNITY): Payer: Self-pay | Admitting: Nurse Practitioner

## 2020-01-02 ENCOUNTER — Encounter: Payer: Self-pay | Admitting: Nurse Practitioner

## 2020-01-02 DIAGNOSIS — Z86718 Personal history of other venous thrombosis and embolism: Secondary | ICD-10-CM | POA: Diagnosis not present

## 2020-01-02 LAB — POCT INR: INR: 3.2 — AB (ref 0.9–1.1)

## 2020-01-03 ENCOUNTER — Telehealth: Payer: Self-pay

## 2020-01-03 ENCOUNTER — Encounter: Payer: PRIVATE HEALTH INSURANCE | Admitting: Nurse Practitioner

## 2020-01-03 ENCOUNTER — Encounter: Payer: Self-pay | Admitting: Nurse Practitioner

## 2020-01-03 ENCOUNTER — Encounter: Payer: PRIVATE HEALTH INSURANCE | Admitting: Internal Medicine

## 2020-01-03 NOTE — Telephone Encounter (Signed)
I left the pt a message that Laurance Flatten, FNP wanted to confirm the dose of coumadin that he has been taking.

## 2020-01-09 ENCOUNTER — Encounter: Payer: Self-pay | Admitting: Nurse Practitioner

## 2020-01-09 DIAGNOSIS — Z86718 Personal history of other venous thrombosis and embolism: Secondary | ICD-10-CM

## 2020-01-09 LAB — POCT INR: INR: 2.9 — AB (ref 0.9–1.1)

## 2020-01-14 ENCOUNTER — Encounter: Payer: Self-pay | Admitting: Nurse Practitioner

## 2020-01-16 ENCOUNTER — Encounter: Payer: Self-pay | Admitting: Nurse Practitioner

## 2020-01-16 DIAGNOSIS — Z86718 Personal history of other venous thrombosis and embolism: Secondary | ICD-10-CM | POA: Diagnosis not present

## 2020-01-16 LAB — POCT INR: INR: 3.5 — AB (ref 0.9–1.1)

## 2020-01-17 ENCOUNTER — Encounter: Payer: Self-pay | Admitting: Nurse Practitioner

## 2020-01-23 ENCOUNTER — Encounter: Payer: Self-pay | Admitting: Nurse Practitioner

## 2020-01-23 DIAGNOSIS — Z86718 Personal history of other venous thrombosis and embolism: Secondary | ICD-10-CM

## 2020-01-23 LAB — POCT INR: INR: 1.7 — AB (ref 0.9–1.1)

## 2020-01-30 ENCOUNTER — Encounter: Payer: Self-pay | Admitting: Nurse Practitioner

## 2020-01-30 DIAGNOSIS — Z86718 Personal history of other venous thrombosis and embolism: Secondary | ICD-10-CM | POA: Diagnosis not present

## 2020-01-30 LAB — POCT INR: INR: 2.1 — AB (ref 0.9–1.1)

## 2020-02-05 ENCOUNTER — Encounter: Payer: Self-pay | Admitting: Nurse Practitioner

## 2020-02-06 ENCOUNTER — Encounter: Payer: Self-pay | Admitting: Nurse Practitioner

## 2020-02-06 DIAGNOSIS — Z86718 Personal history of other venous thrombosis and embolism: Secondary | ICD-10-CM

## 2020-02-06 LAB — POCT INR: INR: 1.6 — AB (ref 0.9–1.1)

## 2020-02-08 ENCOUNTER — Encounter: Payer: Self-pay | Admitting: Nurse Practitioner

## 2020-02-08 MED FILL — WARFARIN SODIUM 1 MG TABLET: 1 | 90 days supply | Qty: 90 | Fill #0

## 2020-02-11 ENCOUNTER — Other Ambulatory Visit: Payer: Self-pay

## 2020-02-11 DIAGNOSIS — E782 Mixed hyperlipidemia: Secondary | ICD-10-CM

## 2020-02-11 MED ORDER — VALSARTAN-HYDROCHLOROTHIAZIDE 320-25 MG PO TABS: 1.0000 | ORAL_TABLET | Freq: Every day | ORAL | 0 refills | Status: DC

## 2020-02-11 MED ORDER — ATORVASTATIN CALCIUM 20 MG PO TABS: 40.0000 mg | ORAL_TABLET | Freq: Every day | ORAL | 0 refills | Status: DC

## 2020-02-11 MED ORDER — ATORVASTATIN CALCIUM 40 MG PO TABS: 40.0000 mg | ORAL_TABLET | Freq: Every day | ORAL | 1 refills | Status: DC

## 2020-02-11 MED ORDER — ATORVASTATIN CALCIUM 40 MG PO TABS: 40.0000 mg | ORAL_TABLET | Freq: Every day | ORAL | 11 refills | Status: DC

## 2020-02-11 MED FILL — VALSARTAN-HCTZ 320-25 MG TA: 320-25 | 90 days supply | Qty: 90 | Fill #0

## 2020-02-11 MED FILL — WARFARIN SODIUM 2 MG TABLET: 2 | 30 days supply | Qty: 60 | Fill #0

## 2020-02-11 MED FILL — ATORVASTATIN CALCIUM 40 MG: 40 | 90 days supply | Qty: 90 | Fill #0

## 2020-02-11 MED FILL — URSODIOL 250 MG TABS: 250 | 90 days supply | Qty: 180 | Fill #0

## 2020-02-11 MED FILL — TAMSULOSIN HCL 0.4 MG CAP: 0.4 | 90 days supply | Qty: 90 | Fill #0

## 2020-02-13 ENCOUNTER — Encounter: Payer: Self-pay | Admitting: Nurse Practitioner

## 2020-02-13 DIAGNOSIS — Z86718 Personal history of other venous thrombosis and embolism: Secondary | ICD-10-CM | POA: Diagnosis not present

## 2020-02-13 LAB — POCT INR: INR: 2.4 — AB (ref 0.9–1.1)

## 2020-02-19 ENCOUNTER — Telehealth: Payer: Self-pay

## 2020-02-19 NOTE — Telephone Encounter (Signed)
I called pt and notified him that his placard form has been completed and he can come pick it up. He requested that I fax it to him at [phone]. Form has been faxed. YL,RMA

## 2020-02-20 ENCOUNTER — Encounter: Payer: Self-pay | Admitting: Nurse Practitioner

## 2020-02-20 DIAGNOSIS — Z86718 Personal history of other venous thrombosis and embolism: Secondary | ICD-10-CM

## 2020-02-20 LAB — POCT INR: INR: 2.4 — AB (ref 0.9–1.1)

## 2020-02-27 ENCOUNTER — Encounter: Payer: Self-pay | Admitting: Nurse Practitioner

## 2020-02-27 LAB — POCT INR: INR: 2.2 — AB (ref 0.9–1.1)

## 2020-02-28 ENCOUNTER — Encounter: Payer: Self-pay | Admitting: Nurse Practitioner

## 2020-03-05 ENCOUNTER — Encounter: Payer: Self-pay | Admitting: Nurse Practitioner

## 2020-03-05 LAB — POCT INR: INR: 2.8 — AB (ref 0.9–1.1)

## 2020-03-06 ENCOUNTER — Encounter: Payer: Self-pay | Admitting: Nurse Practitioner

## 2020-03-12 ENCOUNTER — Encounter: Payer: Self-pay | Admitting: Nurse Practitioner

## 2020-03-12 LAB — POCT INR: INR: 3.1 — AB (ref <=1.1)

## 2020-03-14 ENCOUNTER — Encounter: Payer: Self-pay | Admitting: Nurse Practitioner

## 2020-03-19 LAB — POCT INR: INR: 3 — AB (ref <=1.1)

## 2020-03-20 ENCOUNTER — Encounter: Payer: Self-pay | Admitting: Nurse Practitioner

## 2020-03-26 ENCOUNTER — Encounter: Payer: Self-pay | Admitting: Nurse Practitioner

## 2020-03-26 LAB — POCT INR: INR: 2.3 — AB (ref 0.9–1.1)

## 2020-03-27 ENCOUNTER — Encounter: Payer: Self-pay | Admitting: Nurse Practitioner

## 2020-03-28 MED FILL — WARFARIN SODIUM 2 MG TABLET: 2 | 30 days supply | Qty: 60 | Fill #1

## 2020-03-31 ENCOUNTER — Encounter: Payer: Self-pay | Admitting: Nurse Practitioner

## 2020-04-02 ENCOUNTER — Encounter: Payer: Self-pay | Admitting: Nurse Practitioner

## 2020-04-02 LAB — POCT INR: INR: 2.5 — AB (ref 0.9–1.1)

## 2020-04-09 ENCOUNTER — Encounter: Payer: Self-pay | Admitting: Nurse Practitioner

## 2020-04-09 LAB — POCT INR: INR: 2.6 — AB (ref <=1.1)

## 2020-04-16 LAB — POCT INR
INR: 2.1 — AB (ref 0.9–1.1)
INR: 2.1 — AB (ref <=1.1)

## 2020-04-23 ENCOUNTER — Telehealth: Payer: Self-pay

## 2020-04-23 NOTE — Telephone Encounter (Signed)
Attempt to give INR results

## 2020-04-24 ENCOUNTER — Encounter: Payer: Self-pay | Admitting: Nurse Practitioner

## 2020-04-24 LAB — POCT INR: INR: 2.1 — AB (ref 0.9–1.1)

## 2020-04-25 ENCOUNTER — Telehealth: Payer: Self-pay

## 2020-04-25 NOTE — Telephone Encounter (Signed)
The cardiologist suggested you check the ultrasound of the left leg and then determine if I can come off the Coumadin or see the hematologist. Sorry for the delay in the message.  Meryl Crutch

## 2020-04-29 ENCOUNTER — Encounter: Payer: Self-pay | Admitting: Nurse Practitioner

## 2020-04-29 LAB — PROTIME-INR

## 2020-04-29 NOTE — Telephone Encounter (Signed)
Left pt vm to call the office [facility]

## 2020-04-29 NOTE — Telephone Encounter (Signed)
Check to see if he has an upcoming appt with the Hematologist?

## 2020-04-30 ENCOUNTER — Encounter: Payer: Self-pay | Admitting: Nurse Practitioner

## 2020-04-30 LAB — POCT INR: INR: 2.3 — AB (ref <=1.1)

## 2020-05-01 ENCOUNTER — Encounter: Payer: Self-pay | Admitting: Nurse Practitioner

## 2020-05-07 LAB — POCT INR: INR: 1.8 — AB (ref 0.9–1.1)

## 2020-05-08 ENCOUNTER — Encounter: Payer: Self-pay | Admitting: Nurse Practitioner

## 2020-05-14 ENCOUNTER — Encounter: Payer: Self-pay | Admitting: Nurse Practitioner

## 2020-05-14 LAB — POCT INR: INR: 2.1 — AB (ref 0.9–1.1)

## 2020-05-15 ENCOUNTER — Encounter: Payer: Self-pay | Admitting: Nurse Practitioner

## 2020-05-20 ENCOUNTER — Encounter: Payer: Self-pay | Admitting: Nurse Practitioner

## 2020-05-20 ENCOUNTER — Ambulatory Visit (INDEPENDENT_AMBULATORY_CARE_PROVIDER_SITE_OTHER): Payer: 59 | Admitting: Nurse Practitioner

## 2020-05-20 ENCOUNTER — Other Ambulatory Visit: Payer: Self-pay

## 2020-05-20 VITALS — BP 132/74 | HR 75 | Temp 97.4°F | Ht 66.0 in | Wt 230.4 lb

## 2020-05-20 DIAGNOSIS — R7309 Other abnormal glucose: Secondary | ICD-10-CM

## 2020-05-20 DIAGNOSIS — Z6837 Body mass index (BMI) 37.0-37.9, adult: Secondary | ICD-10-CM | POA: Diagnosis not present

## 2020-05-20 DIAGNOSIS — I825Y2 Chronic embolism and thrombosis of unspecified deep veins of left proximal lower extremity: Secondary | ICD-10-CM

## 2020-05-20 DIAGNOSIS — I10 Essential (primary) hypertension: Secondary | ICD-10-CM

## 2020-05-20 DIAGNOSIS — Z1159 Encounter for screening for other viral diseases: Secondary | ICD-10-CM | POA: Diagnosis not present

## 2020-05-20 DIAGNOSIS — E782 Mixed hyperlipidemia: Secondary | ICD-10-CM | POA: Diagnosis not present

## 2020-05-20 NOTE — Patient Instructions (Signed)

## 2020-05-20 NOTE — Progress Notes (Signed)
I,[name],acting as a [name] for [name], FNP.,have documented all relevant documentation on the behalf of [name] Brine, FNP,as directed by  [name] Brine, FNP while in the presence of [name], [name].  This visit occurred during the SARS-CoV-2 public health emergency.  Safety protocols were in place, including screening questions prior to the visit, additional usage of staff PPE, and extensive cleaning of exam room while observing appropriate contact time as indicated for disinfecting solutions.  Subjective:     Patient ID: [name] , male    DOB: 1956/04/01 , 64 y.o.   MRN: [id number]   Chief Complaint  Patient presents with  . Hypertension    HPI  Patient is here for follow up for htn. She would like to be referred somewhere to check on the status of his clots. He is back at work with employee health. At times he will get winded. He does walk every morning 1/2 mile to 1 mile; even with exercise his pulse ox will be 90-91 but recovers quickly. If he gets excited he will have to take a deep breath.  Coumadin 5 mg MWF and 3 mg the other days.  He will check again tomorrow  Wt Readings from Last 3 Encounters: 05/20/20 : 230 lb 6.4 oz (104.5 kg) 11/14/19 : 221 lb 9.6 oz (100.5 kg) 11/13/19 : 223 lb (101.2 kg)   Hypertension This is a chronic problem. The current episode started more than 1 year ago. The problem is unchanged. The problem is controlled. Pertinent negatives include no anxiety, chest pain, headaches or palpitations. There are no associated agents to hypertension. Risk factors for coronary artery disease include obesity and male gender. There are no compliance problems.  There is no history of angina.     Past Medical History:  Diagnosis Date  . Diabetes mellitus without complication ([place])   . GERD (gastroesophageal reflux disease)   . 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 Hyperlipidemia   . Hypertension   . Liver disorder    tx with ursodiol       Family History  Problem Relation Age of Onset  . Colon polyps Father   . Colon cancer Neg Hx   . Rectal cancer Neg Hx   . Stomach cancer Neg Hx      Current Outpatient Medications:  .  acetaminophen (TYLENOL) 500 MG tablet, Take 500 mg by mouth every 6 (six) hours as needed for mild pain or fever. , Disp: , Rfl:  .  Alcohol Swabs (ALCOHOL WIPES) 70 % PADS, Use to wipe skin to check CBG, Disp: 100 each, Rfl: 0 .  Ascorbic Acid (VITAMIN C) 1000 MG tablet, Take 1,000 mg by mouth daily., Disp: , Rfl:  .  aspirin 81 MG chewable tablet, Chew 81 mg by mouth daily., Disp: , Rfl:  .  atorvastatin (LIPITOR) 40 MG tablet, Take 1 tablet (40 mg total) by mouth daily., Disp: 90 tablet, Rfl: 1 .  blood glucose meter kit and supplies KIT, Dispense based on patient and insurance preference. Use up to four times daily as directed. (FOR ICD-9 250.00, 250.01)., Disp: 1 each, Rfl: 0 .  famotidine (PEPCID) 20 MG tablet, Take 40 mg by mouth as needed. , Disp: , Rfl:  .  metFORMIN (GLUCOPHAGE) 500 MG tablet, Take 1 tablet (500 mg total) by mouth daily with breakfast., Disp: 30 tablet, Rfl: 2 .  Omega-3 Fatty Acids (FISH OIL PO), Take by mouth daily., Disp: ,  Rfl:  .  tamsulosin (FLOMAX) 0.4 MG CAPS capsule, TAKE 1 CAPSULE BY MOUTH DAILY AFTER SUPPER., Disp: 90 capsule, Rfl: 0 .  ursodiol (ACTIGALL) 250 MG tablet, TAKE 2 TABLETS BY MOUTH DAILY, Disp: 180 tablet, Rfl: 0 .  valsartan-hydrochlorothiazide (DIOVAN-HCT) 320-25 MG tablet, Take 1 tablet by mouth daily., Disp: 90 tablet, Rfl: 0 .  VITAMIN D PO, Take 5,000 Units by mouth daily., Disp: , Rfl:    No Known Allergies   Review of Systems  Constitutional: Negative.   Respiratory: Negative.   Cardiovascular: Negative.  Negative for chest pain and palpitations.  Gastrointestinal: Negative.   Neurological: Negative.  Negative for headaches.  Psychiatric/Behavioral: Negative.      Today's Vitals   05/20/20 1458  BP: 132/74  Pulse: 75  Temp: (!)  97.4 F (36.3 C)  TempSrc: Oral  Weight: 230 lb 6.4 oz (104.5 kg)  Height: _0  (1.676 m)   Body mass index is 37.19 kg/m.  Wt Readings from Last 3 Encounters:  05/20/20 230 lb 6.4 oz (104.5 kg)  11/14/19 221 lb 9.6 oz (100.5 kg)  11/13/19 223 lb (101.2 kg)     Objective:  Physical Exam Constitutional:      General: He is not in acute distress.    Appearance: Normal appearance. He is obese.  Cardiovascular:     Rate and Rhythm: Normal rate.     Pulses: Normal pulses.     Heart sounds: Normal heart sounds. No murmur heard.   Pulmonary:     Effort: Pulmonary effort is normal. No respiratory distress.     Breath sounds: Normal breath sounds.  Musculoskeletal:     Comments: Right AKA with prosthesis  Skin:    Capillary Refill: Capillary refill takes less than 2 seconds.  Neurological:     General: No focal deficit present.     Mental Status: He is alert and oriented to person, place, and time.     Cranial Nerves: No cranial nerve deficit.  Psychiatric:        Mood and Affect: Mood normal.        Behavior: Behavior normal.        Thought Content: Thought content normal.        Judgment: Judgment normal.         Assessment And Plan:     1. Essential hypertension . B/P is controlled.  . CMP ordered to check renal function.  . The importance of regular exercise and dietary modification was stressed to the patient.  - CBC - CMP14+EGFR  2. Class 2 severe obesity due to excess calories with serious comorbidity and body mass index (BMI) of 37.0 to 37.9 in adult [facility])  Chronic  Discussed healthy diet and regular exercise options   Encouraged to exercise at least 150 minutes per week with 2 days of strength training.  3. Chronic deep vein thrombosis (DVT) of proximal vein of left lower extremity ([place])  He continues with taking warfarin, pending doppler will discontinue warfarin if has been resolved - VAS [place] LOWER EXTREMITY VENOUS (DVT); Future  4. Mixed  hyperlipidemia  Chronic, controlled  Continue with current medications - CBC - Lipid panel  5. Abnormal glucose  Chronic, controlled  No current medications  Encouraged to limit intake of sugary foods and drinks  Encouraged to increase physical activity to 150 minutes per week - CBC - Hemoglobin A1c  6. Encounter for hepatitis C screening test for low risk patient  Will check Hepatitis C screening  due to recent recommendations to screen all adults 18 years and older - Hepatitis C Antibody     Patient was given opportunity to ask questions. Patient verbalized understanding of the plan and was able to repeat key elements of the plan. All questions were answered to their satisfaction.  [name] Brine, FNP   I, [name] Brine, FNP, have reviewed all documentation for this visit. The documentation on 06/11/20 for the exam, diagnosis, procedures, and orders are all accurate and complete.   THE PATIENT IS ENCOURAGED TO PRACTICE SOCIAL DISTANCING DUE TO THE COVID-19 PANDEMIC.

## 2020-05-21 ENCOUNTER — Encounter: Payer: Self-pay | Admitting: Nurse Practitioner

## 2020-05-21 ENCOUNTER — Other Ambulatory Visit: Payer: Self-pay | Admitting: Nurse Practitioner

## 2020-05-21 DIAGNOSIS — I2699 Other pulmonary embolism without acute cor pulmonale: Secondary | ICD-10-CM

## 2020-05-21 LAB — CMP14+EGFR
ALT: 22 IU/L (ref 0–44)
AST: 24 IU/L (ref 0–40)
Albumin/Globulin Ratio: 1.6 (ref 1.2–2.2)
Albumin: 4.5 g/dL (ref 3.8–4.8)
Alkaline Phosphatase: 84 IU/L (ref 44–121)
BUN/Creatinine Ratio: 18 (ref 10–24)
BUN: 20 mg/dL (ref 8–27)
Bilirubin Total: 1 mg/dL (ref 0.0–1.2)
CO2: 24 mmol/L (ref 20–29)
Calcium: 9.4 mg/dL (ref 8.6–10.2)
Chloride: 101 mmol/L (ref 96–106)
Creatinine, Ser: 1.12 mg/dL (ref 0.76–1.27)
GFR calc Af Amer: 80 mL/min/{1.73_m2} (ref >59)
GFR calc non Af Amer: 69 mL/min/{1.73_m2} (ref >59)
Globulin, Total: 2.8 g/dL (ref 1.5–4.5)
Glucose: 102 mg/dL — ABNORMAL HIGH (ref 65–99)
Potassium: 3.8 mmol/L (ref 3.5–5.2)
Sodium: 140 mmol/L (ref 134–144)
Total Protein: 7.3 g/dL (ref 6.0–8.5)

## 2020-05-21 LAB — LIPID PANEL
Chol/HDL Ratio: 3.6 ratio (ref 0.0–5.0)
Cholesterol, Total: 167 mg/dL (ref 100–199)
HDL: 46 mg/dL (ref >39)
LDL Chol Calc (NIH): 98 mg/dL (ref 0–99)
Triglycerides: 130 mg/dL (ref 0–149)
VLDL Cholesterol Cal: 23 mg/dL (ref 5–40)

## 2020-05-21 LAB — CBC
Hematocrit: 46.7 % (ref 37.5–51.0)
Hemoglobin: 16 g/dL (ref 13.0–17.7)
MCH: 30.4 pg (ref 26.6–33.0)
MCHC: 34.3 g/dL (ref 31.5–35.7)
MCV: 89 fL (ref 79–97)
Platelets: 225 10*3/uL (ref 150–450)
RBC: 5.26 x10E6/uL (ref 4.14–5.80)
RDW: 13.6 % (ref 11.6–15.4)
WBC: 8.2 10*3/uL (ref 3.4–10.8)

## 2020-05-21 LAB — HEPATITIS C ANTIBODY: Hep C Virus Ab: <0.1 s/co ratio (ref 0.0–0.9)

## 2020-05-21 LAB — HEMOGLOBIN A1C
Est. average glucose Bld gHb Est-mCnc: 148 mg/dL
Hgb A1c MFr Bld: 6.8 % — ABNORMAL HIGH (ref 4.8–5.6)

## 2020-05-21 LAB — POCT INR: INR: 1.9 — AB (ref <=1.1)

## 2020-05-22 ENCOUNTER — Other Ambulatory Visit: Payer: Self-pay | Admitting: Nurse Practitioner

## 2020-05-22 MED FILL — WARFARIN SODIUM 1 MG TABS: 1 | 90 days supply | Qty: 90 | Fill #0

## 2020-05-22 MED FILL — WARFARIN SODIUM 2 MG TABLET: 2 | 30 days supply | Qty: 60 | Fill #0

## 2020-05-22 MED FILL — URSODIOL 250 MG TABS: 250 | 90 days supply | Qty: 180 | Fill #0

## 2020-05-27 ENCOUNTER — Other Ambulatory Visit: Payer: Self-pay

## 2020-05-27 ENCOUNTER — Ambulatory Visit (INDEPENDENT_AMBULATORY_CARE_PROVIDER_SITE_OTHER): Payer: 59

## 2020-05-27 DIAGNOSIS — I825Y2 Chronic embolism and thrombosis of unspecified deep veins of left proximal lower extremity: Secondary | ICD-10-CM | POA: Diagnosis not present

## 2020-05-28 ENCOUNTER — Other Ambulatory Visit: Payer: Self-pay | Admitting: Nurse Practitioner

## 2020-05-28 ENCOUNTER — Encounter: Payer: Self-pay | Admitting: Nurse Practitioner

## 2020-05-28 MED ORDER — METFORMIN HCL 500 MG PO TABS: 500.0000 mg | ORAL_TABLET | Freq: Every day | ORAL | 2 refills | Status: DC

## 2020-05-29 ENCOUNTER — Encounter: Payer: Self-pay | Admitting: Nurse Practitioner

## 2020-05-29 LAB — POCT INR: INR: 2.7 — AB (ref 0.9–1.1)

## 2020-06-02 DIAGNOSIS — Z89511 Acquired absence of right leg below knee: Secondary | ICD-10-CM | POA: Diagnosis not present

## 2020-06-03 ENCOUNTER — Encounter: Payer: Self-pay | Admitting: Nurse Practitioner

## 2020-06-05 ENCOUNTER — Telehealth: Payer: Self-pay

## 2020-06-05 NOTE — Telephone Encounter (Signed)
Called patient to give results of doppler which showed complete resolution of DVT. In [place] he had complete resolution of his PE. He will discontinue warfarin and take 325 mg aspirin daily for one month then 81 mg aspirin daily. He is aware if has leg pain or chest pain/shortness of breath to go to ER.  He will also provide the name of the company used for his INR machine to see if it needs to be returned.

## 2020-06-05 NOTE — Telephone Encounter (Signed)
Pt will like to talk with you by phone or [facility] regarding ultrasound results

## 2020-06-06 MED FILL — METFORMIN HCL 500 MG TABS: 500 | 30 days supply | Qty: 30 | Fill #0

## 2020-06-09 ENCOUNTER — Other Ambulatory Visit: Payer: Self-pay | Admitting: Nurse Practitioner

## 2020-06-10 ENCOUNTER — Other Ambulatory Visit: Payer: Self-pay | Admitting: Nurse Practitioner

## 2020-06-10 MED FILL — TAMSULOSIN HCL 0.4 MG CAP: 0.4 | 90 days supply | Qty: 90 | Fill #0

## 2020-06-12 ENCOUNTER — Encounter: Payer: Self-pay | Admitting: Nurse Practitioner

## 2020-06-13 ENCOUNTER — Other Ambulatory Visit: Payer: Self-pay | Admitting: Nurse Practitioner

## 2020-06-13 MED ORDER — MECLIZINE HCL 12.5 MG PO TABS: 12.5000 mg | ORAL_TABLET | Freq: Three times a day (TID) | ORAL | 0 refills | Status: DC | PRN

## 2020-06-16 ENCOUNTER — Encounter: Payer: Self-pay | Admitting: Nurse Practitioner

## 2020-06-30 DIAGNOSIS — Z89511 Acquired absence of right leg below knee: Secondary | ICD-10-CM | POA: Diagnosis not present

## 2020-07-10 MED FILL — METFORMIN HCL 500 MG TABS: 500 | 30 days supply | Qty: 30 | Fill #1

## 2020-08-12 ENCOUNTER — Other Ambulatory Visit (HOSPITAL_BASED_OUTPATIENT_CLINIC_OR_DEPARTMENT_OTHER): Payer: Self-pay

## 2020-09-03 ENCOUNTER — Other Ambulatory Visit (HOSPITAL_COMMUNITY): Payer: Self-pay

## 2020-09-04 ENCOUNTER — Other Ambulatory Visit (HOSPITAL_COMMUNITY): Payer: Self-pay

## 2020-09-04 MED ORDER — ATORVASTATIN CALCIUM 40 MG PO TABS
40.0000 mg | ORAL_TABLET | Freq: Every day | ORAL | 0 refills | Status: DC
  Filled 2020-09-04: qty 90, 90d supply, fill #0

## 2020-09-08 ENCOUNTER — Encounter: Payer: Self-pay | Admitting: Nurse Practitioner

## 2020-09-08 ENCOUNTER — Other Ambulatory Visit: Payer: Self-pay

## 2020-09-08 ENCOUNTER — Other Ambulatory Visit (HOSPITAL_COMMUNITY): Payer: Self-pay

## 2020-09-08 MED ORDER — VALSARTAN-HYDROCHLOROTHIAZIDE 320-25 MG PO TABS
1.0000 | ORAL_TABLET | Freq: Every day | ORAL | 1 refills | Status: DC
  Filled 2020-09-08: qty 90, 90d supply, fill #0
  Filled 2021-02-04: qty 90, 90d supply, fill #1

## 2020-09-08 MED FILL — Metformin HCl Tab 500 MG: ORAL | 30 days supply | Qty: 30 | Fill #0 | Status: AC

## 2020-09-10 ENCOUNTER — Other Ambulatory Visit (HOSPITAL_COMMUNITY): Payer: Self-pay

## 2020-09-16 ENCOUNTER — Other Ambulatory Visit (HOSPITAL_COMMUNITY): Payer: Self-pay | Admitting: *Deleted

## 2020-09-22 ENCOUNTER — Encounter: Payer: Self-pay | Admitting: Nurse Practitioner

## 2020-09-29 ENCOUNTER — Other Ambulatory Visit (HOSPITAL_COMMUNITY): Payer: Self-pay

## 2020-09-29 ENCOUNTER — Other Ambulatory Visit: Payer: Self-pay | Admitting: Nurse Practitioner

## 2020-09-29 MED ORDER — TAMSULOSIN HCL 0.4 MG PO CAPS
ORAL_CAPSULE | ORAL | 0 refills | Status: DC
  Filled 2020-09-29: qty 90, 90d supply, fill #0

## 2020-09-30 ENCOUNTER — Encounter
Admission: RE | Admit: 2020-09-30 | Discharge: 2020-09-30 | Disposition: A | Discharge status: Home / Self Care | Payer: 59 | Source: Ambulatory Visit | Attending: Cardiology | Admitting: Cardiology

## 2020-09-30 ENCOUNTER — Other Ambulatory Visit: Payer: Self-pay

## 2020-10-07 ENCOUNTER — Encounter: Payer: Self-pay | Admitting: Nurse Practitioner

## 2020-10-10 ENCOUNTER — Other Ambulatory Visit (HOSPITAL_COMMUNITY): Payer: Self-pay

## 2020-10-15 ENCOUNTER — Other Ambulatory Visit: Payer: Self-pay | Admitting: Nurse Practitioner

## 2020-10-15 ENCOUNTER — Other Ambulatory Visit (HOSPITAL_COMMUNITY): Payer: Self-pay

## 2020-10-15 MED ORDER — METFORMIN HCL 500 MG PO TABS
ORAL_TABLET | Freq: Every day | ORAL | 2 refills | Status: DC
  Filled 2020-10-15: qty 30, 30d supply, fill #0

## 2020-10-18 IMAGING — DX DG CHEST 1V
1 series · 1 of 1 positions shown · non-contrast
Comparison: Single-view of the chest 04/13/2019 and 04/08/2019.

CLINICAL DATA: Onset shortness of breath, cough, fever and chills 5
days ago. [id number] positive patient.

EXAM:
CHEST  1 VIEW

[chest]
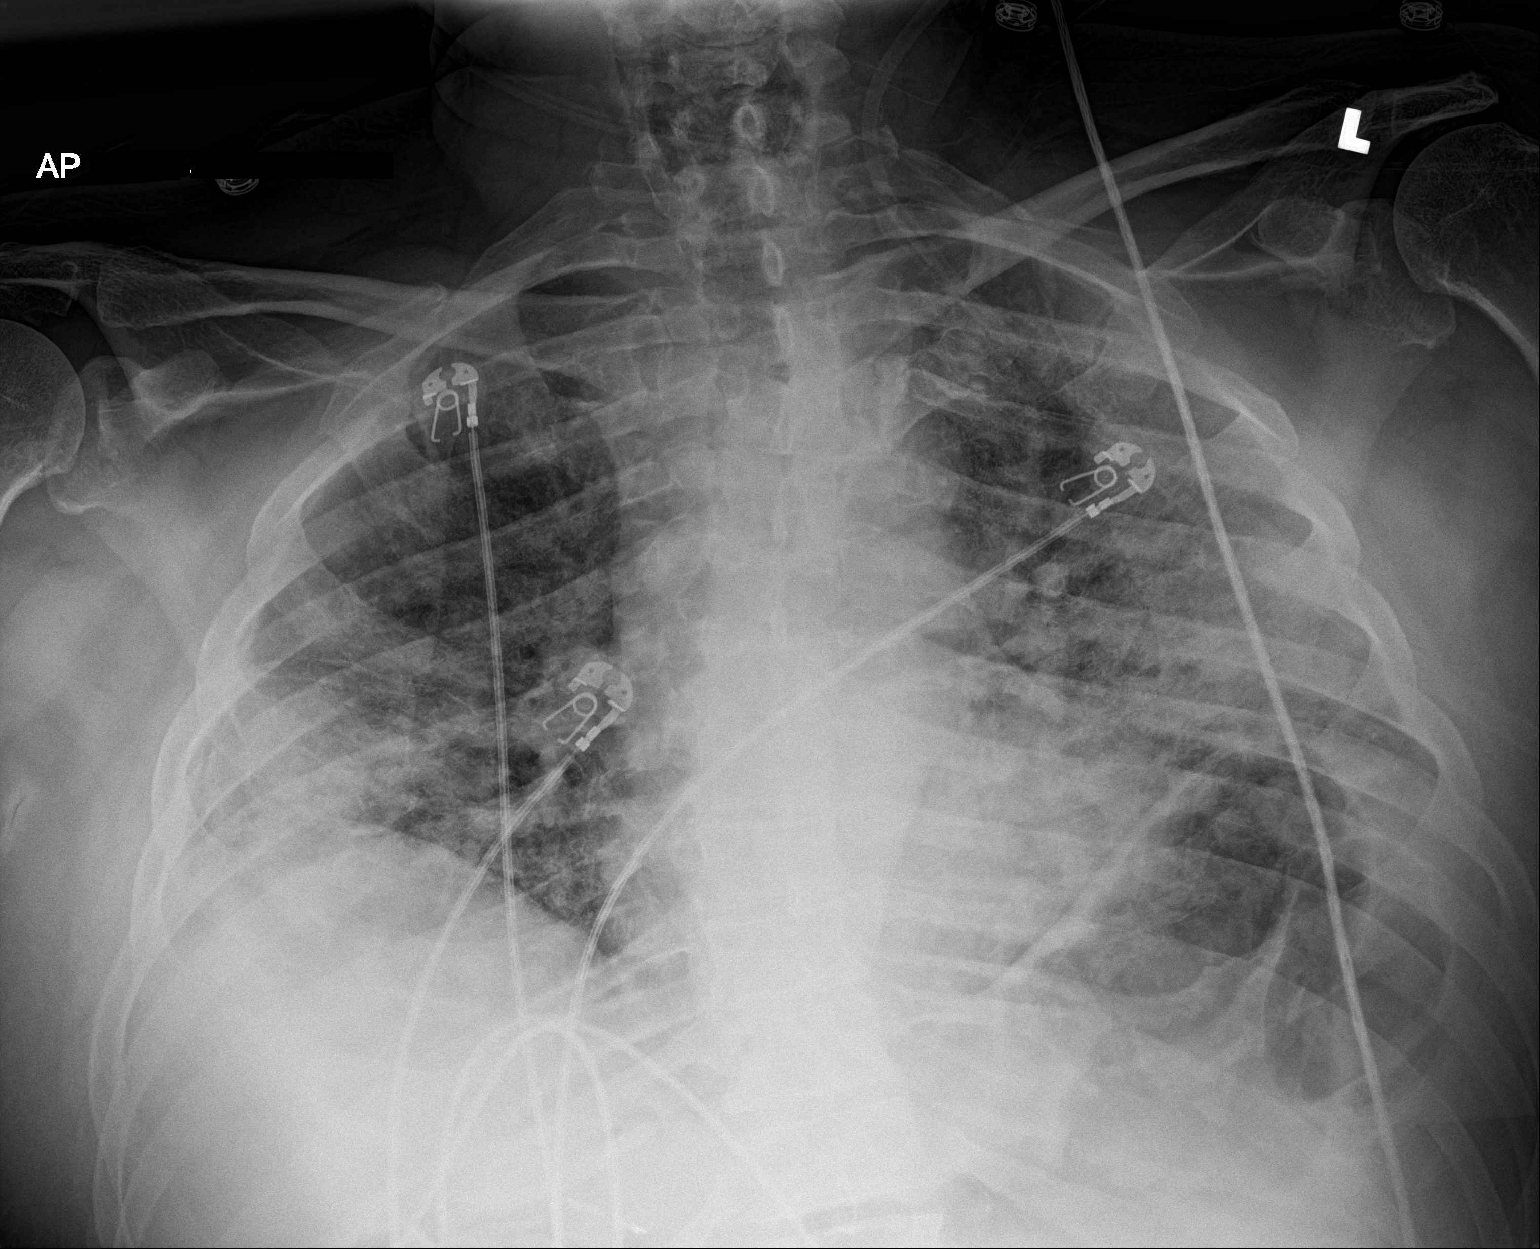

[1 of 1 positions shown; findings below may reference images not displayed]

FINDINGS: Extensive multifocal airspace disease is worse on the left and has
progressed since the prior examinations. No pneumothorax or pleural
fluid. Heart size is normal.
IMPRESSION: Worsened multifocal pneumonia.

## 2020-11-03 DIAGNOSIS — Z89511 Acquired absence of right leg below knee: Secondary | ICD-10-CM | POA: Diagnosis not present

## 2020-11-18 ENCOUNTER — Other Ambulatory Visit (HOSPITAL_COMMUNITY): Payer: Self-pay

## 2020-11-18 ENCOUNTER — Encounter: Payer: Self-pay | Admitting: Nurse Practitioner

## 2020-11-18 ENCOUNTER — Ambulatory Visit (INDEPENDENT_AMBULATORY_CARE_PROVIDER_SITE_OTHER): Payer: 59 | Admitting: Nurse Practitioner

## 2020-11-18 ENCOUNTER — Other Ambulatory Visit: Payer: Self-pay

## 2020-11-18 VITALS — BP 116/80 | HR 95 | Temp 98.2°F | Ht 66.0 in | Wt 220.6 lb

## 2020-11-18 DIAGNOSIS — Z89619 Acquired absence of unspecified leg above knee: Secondary | ICD-10-CM | POA: Diagnosis not present

## 2020-11-18 DIAGNOSIS — I10 Essential (primary) hypertension: Secondary | ICD-10-CM | POA: Diagnosis not present

## 2020-11-18 DIAGNOSIS — E782 Mixed hyperlipidemia: Secondary | ICD-10-CM | POA: Diagnosis not present

## 2020-11-18 DIAGNOSIS — E6609 Other obesity due to excess calories: Secondary | ICD-10-CM

## 2020-11-18 DIAGNOSIS — Z0000 Encounter for general adult medical examination without abnormal findings: Secondary | ICD-10-CM

## 2020-11-18 DIAGNOSIS — Z8616 Personal history of COVID-19: Secondary | ICD-10-CM | POA: Diagnosis not present

## 2020-11-18 DIAGNOSIS — Z125 Encounter for screening for malignant neoplasm of prostate: Secondary | ICD-10-CM | POA: Diagnosis not present

## 2020-11-18 DIAGNOSIS — R7309 Other abnormal glucose: Secondary | ICD-10-CM

## 2020-11-18 DIAGNOSIS — Z23 Encounter for immunization: Secondary | ICD-10-CM

## 2020-11-18 DIAGNOSIS — Z6835 Body mass index (BMI) 35.0-35.9, adult: Secondary | ICD-10-CM

## 2020-11-18 LAB — POCT URINALYSIS DIPSTICK
Bilirubin, UA: NEGATIVE
Blood, UA: NEGATIVE
Glucose, UA: NEGATIVE
Ketones, UA: NEGATIVE
Leukocytes, UA: NEGATIVE
Nitrite, UA: NEGATIVE
Protein, UA: NEGATIVE
Spec Grav, UA: 1.025 (ref 1.010–1.025)
Urobilinogen, UA: 0.2 E.U./dL
pH, UA: 6 (ref 5.0–8.0)

## 2020-11-18 LAB — POCT UA - MICROALBUMIN
Albumin/Creatinine Ratio, Urine, POC: <30
Creatinine, POC: 300 mg/dL
Microalbumin Ur, POC: 10 mg/L

## 2020-11-18 MED ORDER — SHINGRIX 50 MCG/0.5ML IM SUSR
0.5000 mL | Freq: Once | INTRAMUSCULAR | 0 refills | Status: AC
  Filled 2020-11-18 – 2020-12-15 (×2): qty 0.5, 1d supply, fill #0

## 2020-11-18 MED ORDER — METFORMIN HCL 500 MG PO TABS
500.0000 mg | ORAL_TABLET | Freq: Every day | ORAL | 2 refills | Status: DC
  Filled 2020-11-18 – 2020-11-21 (×2): qty 30, 30d supply, fill #0

## 2020-11-18 NOTE — Progress Notes (Signed)
I,Yamilka Roman [employer] as a Education administrator for [employer], FNP.,have documented all relevant documentation on the behalf of [name], FNP,as directed by  [name], FNP while in the presence of [name], [place].   This visit occurred during the SARS-CoV-2 public health emergency.  Safety protocols were in place, including screening questions prior to the visit, additional usage of staff PPE, and extensive cleaning of exam room while observing appropriate contact time as indicated for disinfecting solutions.  Subjective:     Patient ID: [name] , male    DOB: August 11, 1955 , 65 y.o.   MRN: [id number]   Chief Complaint  Patient presents with   Annual Exam    HPI  Patient here for hm.    Wt Readings from Last 3 Encounters: 11/18/20 : 220 lb 9.6 oz (100.1 kg) 05/20/20 : 230 lb 6.4 oz (104.5 kg) 11/14/19 : 221 lb 9.6 oz (100.5 kg)  He had a calcium scoring gifted by [facility].    Blood sugar in am 108-136.  After taking metformin and before his meals will come down to 106-110. If he checks     Hypertension This is a chronic problem. The current episode started more than 1 year ago. The problem is unchanged. The problem is controlled. Pertinent negatives include no anxiety. There are no associated agents to hypertension. Risk factors for coronary artery disease include obesity and male gender. Past treatments include diuretics and angiotensin blockers.    Past Medical History:  Diagnosis Date   Diabetes mellitus without complication ([facility])    GERD (gastroesophageal reflux disease)    Hx of right BKA ([facility])    uses prosthetic    Hyperlipidemia    Hypertension    Liver disorder    tx with ursodiol      Family History  Problem Relation Age of Onset   Colon polyps Father    Colon cancer Neg Hx    Rectal cancer Neg Hx    Stomach cancer Neg Hx      Current Outpatient Medications:    acetaminophen (TYLENOL) 500 MG tablet, Take 500 mg by mouth every 6 (six) hours  as needed for mild pain or fever. , Disp: , Rfl:    Alcohol Swabs (ALCOHOL WIPES) 70 % PADS, Use to wipe skin to check CBG, Disp: 100 each, Rfl: 0   Ascorbic Acid (VITAMIN C) 1000 MG tablet, Take 1,000 mg by mouth daily., Disp: , Rfl:    aspirin 81 MG chewable tablet, Chew 81 mg by mouth daily., Disp: , Rfl:    atorvastatin (LIPITOR) 40 MG tablet, Take 1 tablet (40 mg total) by mouth daily., Disp: 90 tablet, Rfl: 1   blood glucose meter kit and supplies KIT, Dispense based on patient and insurance preference. Use up to four times daily as directed. (FOR ICD-9 250.00, 250.01)., Disp: 1 each, Rfl: 0   famotidine (PEPCID) 20 MG tablet, Take 40 mg by mouth as needed. , Disp: , Rfl:    Omega-3 Fatty Acids (FISH OIL PO), Take by mouth daily., Disp: , Rfl:    tamsulosin (FLOMAX) 0.4 MG CAPS capsule, TAKE 1 CAPSULE BY MOUTH DAILY AFTER SUPPER., Disp: 90 capsule, Rfl: 0   ursodiol (ACTIGALL) 250 MG tablet, TAKE 2 TABLETS BY MOUTH ONCE A DAY, Disp: 180 tablet, Rfl: 0   valsartan-hydrochlorothiazide (DIOVAN-HCT) 320-25 MG tablet, Take 1 tablet by mouth daily., Disp: 90 tablet, Rfl: 1   VITAMIN D PO, Take 5,000 Units by mouth daily., Disp: ,  Rfl:    meclizine (ANTIVERT) 12.5 MG tablet, Take 1 tablet (12.5 mg total) by mouth 3 (three) times daily as needed for dizziness. (Patient not taking: Reported on 11/18/2020), Disp: 30 tablet, Rfl: 0   metFORMIN (GLUCOPHAGE) 500 MG tablet, Take 1 tablet (500 mg total) by mouth daily with breakfast., Disp: 30 tablet, Rfl: 2   No Known Allergies   Men's preventive visit. Patient Health Questionnaire (PHQ-2) is  [place] Office Visit from 11/18/2020 in [facility]  PHQ-2 Total Score 0      Patient is on a low carb and low salt diet. He is walking 1/2 to 3/4 mile 4-5 days a week on Saturday walking one mile. Marital status: Married. Relevant history for alcohol use is:  Social History   Substance and Sexual Activity  Alcohol Use No    Relevant history for tobacco use is:  Social History   Tobacco Use  Smoking Status Never  Smokeless Tobacco Never  .   Review of Systems  Constitutional: Negative.   HENT: Negative.    Eyes: Negative.   Respiratory: Negative.    Cardiovascular: Negative.   Gastrointestinal: Negative.   Endocrine: Negative.   Genitourinary: Negative.   Musculoskeletal: Negative.   Skin: Negative.   Neurological: Negative.   Hematological: Negative.   Psychiatric/Behavioral: Negative.      Today's Vitals   11/18/20 0834  BP: 116/80  Pulse: 95  Temp: 98.2 F (36.8 C)  Weight: 220 lb 9.6 oz (100.1 kg)  Height: 5' 6"  (1.676 m)  PainSc: 0-No pain   Body mass index is 35.61 kg/m.   Objective:  Physical Exam Vitals reviewed.  Constitutional:      General: He is not in acute distress.    Appearance: Normal appearance. He is obese.  HENT:     Head: Normocephalic and atraumatic.     Right Ear: Tympanic membrane, ear canal and external ear normal. There is no impacted cerumen.     Left Ear: Tympanic membrane, ear canal and external ear normal. There is no impacted cerumen.     Nose:     Comments: Deferred - masked    Mouth/Throat:     Comments: Deferred - masked Eyes:     Extraocular Movements: Extraocular movements intact.     Conjunctiva/sclera: Conjunctivae normal.     Pupils: Pupils are equal, round, and reactive to light.  Cardiovascular:     Rate and Rhythm: Normal rate and regular rhythm.     Pulses: Normal pulses.     Heart sounds: Normal heart sounds. No murmur heard. Pulmonary:     Effort: Pulmonary effort is normal. No respiratory distress.     Breath sounds: Normal breath sounds.  Abdominal:     General: Abdomen is flat. Bowel sounds are normal. There is no distension.     Palpations: Abdomen is soft. There is no mass.     Tenderness: There is no abdominal tenderness.  Genitourinary:    Prostate: Normal.     Rectum: Guaiac result negative (negative guaic).   Musculoskeletal:        General: Normal range of motion.     Cervical back: Normal range of motion and neck supple.     Left lower leg: No edema.     Comments: Right below the knee amputee  Skin:    General: Skin is warm and dry.     Capillary Refill: Capillary refill takes less than 2 seconds.     Findings: No  rash.  Neurological:     General: No focal deficit present.     Mental Status: He is alert and oriented to person, place, and time.     Cranial Nerves: No cranial nerve deficit.     Motor: No weakness.  Psychiatric:        Mood and Affect: Mood normal.        Behavior: Behavior normal.        Thought Content: Thought content normal.        Judgment: Judgment normal.        Assessment And Plan:    1. Encounter for general adult medical examination w/o abnormal findings Behavior modifications discussed and diet history reviewed.   Pt will continue to exercise regularly and modify diet with low GI, plant based foods and decrease intake of processed foods.  Recommend intake of daily multivitamin, Vitamin D, and calcium.  Recommend colonoscopy (up to date) for preventive screenings, as well as recommend immunizations that include influenza, TDAP, and Shingles  2. Essential hypertension Comments: Well controlled blood pressure Continue with current medications - POCT Urinalysis Dipstick (81002) - EKG 12-Lead - CMP14+EGFR  3. Mixed hyperlipidemia Comments: Stable, continue with statin and tolerating well - Lipid panel - Ambulatory referral to Cardiology  4. Abnormal glucose Comments: Improving, continue with metformin. Pending results will add an evening dose - POCT UA - Microalbumin - CBC no Diff - Hemoglobin A1c  5. Class 2 obesity due to excess calories with body mass index (BMI) of 35.0 to 35.9 in adult, unspecified whether serious comorbidity present Chronic, weight is stable Encouraged to continue with regular exercise and healthy eating  6. History of  amputation of lower extremity ([place])  7. Encounter for prostate cancer screening Manual prostate exam done normal. Will check PSA level - PSA  8. History of COVID-19 He was hospitalized in 2020 with Covid and would like a referral to Cardiology to ensure no damage has been done. He also had a Calcium scoring which was a 74, already on a statin - Ambulatory referral to Cardiology  9. Encounter for immunization - Zoster Vaccine Adjuvanted [facility]) injection; Inject 0.5 mLs into the muscle once for 1 dose.  Dispense: 0.5 mL; Refill: 0     Patient was given opportunity to ask questions. Patient verbalized understanding of the plan and was able to repeat key elements of the plan. All questions were answered to their satisfaction.   [name], FNP   I, [name], FNP, have reviewed all documentation for this visit. The documentation on 11/18/20 for the exam, diagnosis, procedures, and orders are all accurate and complete.   THE PATIENT IS ENCOURAGED TO PRACTICE SOCIAL DISTANCING DUE TO THE COVID-19 PANDEMIC.

## 2020-11-18 NOTE — Patient Instructions (Signed)
Health Maintenance, Male Adopting a healthy lifestyle and getting preventive care are important in promoting health and wellness. Ask your health care provider about: The right schedule for you to have regular tests and exams. Things you can do on your own to prevent diseases and keep yourself healthy. What should I know about diet, weight, and exercise? Eat a healthy diet  Eat a diet that includes plenty of vegetables, fruits, low-fat dairy products, and lean protein. Do not eat a lot of foods that are high in solid fats, added sugars, or sodium.  Maintain a healthy weight Body mass index (BMI) is a measurement that can be used to identify possible weight problems. It estimates body fat based on height and weight. Your health care provider can help determine your BMI and help you achieve or maintain ahealthy weight. Get regular exercise Get regular exercise. This is one of the most important things you can do for your health. Most adults should: Exercise for at least 150 minutes each week. The exercise should increase your heart rate and make you sweat (moderate-intensity exercise). Do strengthening exercises at least twice a week. This is in addition to the moderate-intensity exercise. Spend less time sitting. Even light physical activity can be beneficial. Watch cholesterol and blood lipids Have your blood tested for lipids and cholesterol at 65 years of age, then havethis test every 5 years. You may need to have your cholesterol levels checked more often if: Your lipid or cholesterol levels are high. You are older than 65 years of age. You are at high risk for heart disease. What should I know about cancer screening? Many types of cancers can be detected early and may often be prevented. Depending on your health history and family history, you may need to have cancer screening at various ages. This may include screening for: Colorectal cancer. Prostate cancer. Skin cancer. Lung  cancer. What should I know about heart disease, diabetes, and high blood pressure? Blood pressure and heart disease High blood pressure causes heart disease and increases the risk of stroke. This is more likely to develop in people who have high blood pressure readings, are of African descent, or are overweight. Talk with your health care provider about your target blood pressure readings. Have your blood pressure checked: Every 3-5 years if you are 18-39 years of age. Every year if you are 40 years old or older. If you are between the ages of 65 and 75 and are a current or former smoker, ask your health care provider if you should have a one-time screening for abdominal aortic aneurysm (AAA). Diabetes Have regular diabetes screenings. This checks your fasting blood sugar level. Have the screening done: Once every three years after age 45 if you are at a normal weight and have a low risk for diabetes. More often and at a younger age if you are overweight or have a high risk for diabetes. What should I know about preventing infection? Hepatitis B If you have a higher risk for hepatitis B, you should be screened for this virus. Talk with your health care provider to find out if you are at risk forhepatitis B infection. Hepatitis C Blood testing is recommended for: Everyone born from 1945 through 1965. Anyone with known risk factors for hepatitis C. Sexually transmitted infections (STIs) You should be screened each year for STIs, including gonorrhea and chlamydia, if: You are sexually active and are younger than 65 years of age. You are older than 65 years of age   and your health care provider tells you that you are at risk for this type of infection. Your sexual activity has changed since you were last screened, and you are at increased risk for chlamydia or gonorrhea. Ask your health care provider if you are at risk. Ask your health care provider about whether you are at high risk for HIV.  Your health care provider may recommend a prescription medicine to help prevent HIV infection. If you choose to take medicine to prevent HIV, you should first get tested for HIV. You should then be tested every 3 months for as long as you are taking the medicine. Follow these instructions at home: Lifestyle Do not use any products that contain nicotine or tobacco, such as cigarettes, e-cigarettes, and chewing tobacco. If you need help quitting, ask your health care provider. Do not use street drugs. Do not share needles. Ask your health care provider for help if you need support or information about quitting drugs. Alcohol use Do not drink alcohol if your health care provider tells you not to drink. If you drink alcohol: Limit how much you have to 0-2 drinks a day. Be aware of how much alcohol is in your drink. In the [place] one drink equals one 12 oz bottle of beer (355 mL), one 5 oz glass of wine (148 mL), or one 1 oz glass of hard liquor (44 mL). General instructions Schedule regular health, dental, and eye exams. Stay current with your vaccines. Tell your health care provider if: You often feel depressed. You have ever been abused or do not feel safe at home. Summary Adopting a healthy lifestyle and getting preventive care are important in promoting health and wellness. Follow your health care provider's instructions about healthy diet, exercising, and getting tested or screened for diseases. Follow your health care provider's instructions on monitoring your cholesterol and blood pressure. This information is not intended to replace advice given to you by your health care provider. Make sure you discuss any questions you have with your healthcare provider. Document Revised: 05/03/2018 Document Reviewed: 05/03/2018 Elsevier Patient Education  2022 [employer]  

## 2020-11-19 LAB — LIPID PANEL
Chol/HDL Ratio: 2.4 ratio (ref 0.0–5.0)
Cholesterol, Total: 121 mg/dL (ref 100–199)
HDL: 51 mg/dL (ref >39)
LDL Chol Calc (NIH): 54 mg/dL (ref 0–99)
Triglycerides: 80 mg/dL (ref 0–149)
VLDL Cholesterol Cal: 16 mg/dL (ref 5–40)

## 2020-11-19 LAB — CMP14+EGFR
ALT: 17 IU/L (ref 0–44)
AST: 21 IU/L (ref 0–40)
Albumin/Globulin Ratio: 1.8 (ref 1.2–2.2)
Albumin: 4.8 g/dL (ref 3.8–4.8)
Alkaline Phosphatase: 78 IU/L (ref 44–121)
BUN/Creatinine Ratio: 11 (ref 10–24)
BUN: 13 mg/dL (ref 8–27)
Bilirubin Total: 0.8 mg/dL (ref 0.0–1.2)
CO2: 25 mmol/L (ref 20–29)
Calcium: 9.7 mg/dL (ref 8.6–10.2)
Chloride: 99 mmol/L (ref 96–106)
Creatinine, Ser: 1.21 mg/dL (ref 0.76–1.27)
Globulin, Total: 2.7 g/dL (ref 1.5–4.5)
Glucose: 113 mg/dL — ABNORMAL HIGH (ref 65–99)
Potassium: 4.3 mmol/L (ref 3.5–5.2)
Sodium: 139 mmol/L (ref 134–144)
Total Protein: 7.5 g/dL (ref 6.0–8.5)
eGFR: 67 mL/min/{1.73_m2} (ref >59)

## 2020-11-19 LAB — PSA: Prostate Specific Ag, Serum: 0.8 ng/mL (ref 0.0–4.0)

## 2020-11-19 LAB — CBC
Hematocrit: 46.8 % (ref 37.5–51.0)
Hemoglobin: 15.8 g/dL (ref 13.0–17.7)
MCH: 30.8 pg (ref 26.6–33.0)
MCHC: 33.8 g/dL (ref 31.5–35.7)
MCV: 91 fL (ref 79–97)
Platelets: 204 10*3/uL (ref 150–450)
RBC: 5.13 x10E6/uL (ref 4.14–5.80)
RDW: 13.3 % (ref 11.6–15.4)
WBC: 6.6 10*3/uL (ref 3.4–10.8)

## 2020-11-19 LAB — HEMOGLOBIN A1C
Est. average glucose Bld gHb Est-mCnc: 140 mg/dL
Hgb A1c MFr Bld: 6.5 % — ABNORMAL HIGH (ref 4.8–5.6)

## 2020-11-21 ENCOUNTER — Other Ambulatory Visit (HOSPITAL_COMMUNITY): Payer: Self-pay

## 2020-11-25 ENCOUNTER — Other Ambulatory Visit: Payer: Self-pay

## 2020-11-28 ENCOUNTER — Other Ambulatory Visit (HOSPITAL_COMMUNITY): Payer: Self-pay

## 2020-11-28 ENCOUNTER — Other Ambulatory Visit: Payer: Self-pay | Admitting: Nurse Practitioner

## 2020-11-28 DIAGNOSIS — E782 Mixed hyperlipidemia: Secondary | ICD-10-CM

## 2020-11-28 MED FILL — Atorvastatin Calcium Tab 40 MG (Base Equivalent): ORAL | 90 days supply | Qty: 90 | Fill #0 | Status: AC

## 2020-12-15 ENCOUNTER — Encounter: Payer: Self-pay | Admitting: Nurse Practitioner

## 2020-12-15 ENCOUNTER — Other Ambulatory Visit: Payer: Self-pay

## 2020-12-18 ENCOUNTER — Encounter: Payer: Self-pay | Admitting: Nurse Practitioner

## 2020-12-18 ENCOUNTER — Other Ambulatory Visit (HOSPITAL_COMMUNITY): Payer: Self-pay

## 2020-12-18 ENCOUNTER — Other Ambulatory Visit: Payer: Self-pay

## 2020-12-18 MED ORDER — METFORMIN HCL 500 MG PO TABS
500.0000 mg | ORAL_TABLET | Freq: Two times a day (BID) | ORAL | 1 refills | Status: DC
  Filled 2020-12-18: qty 180, 90d supply, fill #0
  Filled 2021-04-15: qty 180, 90d supply, fill #1

## 2021-01-02 NOTE — Progress Notes (Signed)
CARDIOLOGY CONSULT NOTE       Patient ID: [name] MRN: [id number] DOB/AGE: 06-14-1955 65 y.o.  Admit date: (Not on file) Referring Physician: Ramaswamy Primary Physician: [name], FNP Primary Cardiologist: New Reason for Consultation: PE  Active Problems:   * No active hospital problems. *   HPI:  65 y.o. referred by Dr Chase Caller for PE. History of right BKA, DM, HTL, HTN. Was admitted to [facility] April 07 4853 for COVID complicated by CVA and DVT with PE. D/c December on 3 L oxygen prednisone taper and coumadin had CVA while on xarelto so changed to coumadin  TTE reviewed and EF normal 65% no evidence of cor pulmonale Has OSA and using CPAP Working with PT and still has desats with activity on 2 L's Was placed on disability until May 2021   He works as PA in [employer]   Hypercoag panel negative except mild elevation in anticardiolipin   Prior to [place] had no clotting issues and no cardiac issues   During last visit 07/24/19 noted INR's not Rx and indicated to clinic to run him higher  Contributing to his continued need for oxygen. Given that he had thrombus with xarelto and bot CVA And large left popliteal DVT should run his INR 2.5 - 3.0   Now off blood thinners  Calcium score done 09/30/20 was 75 which is 74 th percentile He is on statin with LDL 54 11/18/20   No cardiac symptoms Working in [employer] for health and wellness now past year. Lost about 15 lbs and looks good. Compliant with meds    ROS All other systems reviewed and negative except as noted above  Past Medical History:  Diagnosis Date   Diabetes mellitus without complication (HCC)    GERD (gastroesophageal reflux disease)    Hx of right BKA (HCC)    uses prosthetic    Hyperlipidemia    Hypertension    Liver disorder    tx with ursodiol     Family History  Problem Relation Age of Onset   Colon polyps Father    Colon cancer Neg Hx    Rectal cancer Neg Hx    Stomach cancer Neg Hx      Social History   Socioeconomic History   Marital status: Married    Spouse name: Not on file   Number of children: 1   Years of education: Not on file   Highest education level: Not on file  Occupational History   Not on file  Tobacco Use   Smoking status: Never   Smokeless tobacco: Never  Vaping Use   Vaping Use: Never used  Substance and Sexual Activity   Alcohol use: No   Drug use: No   Sexual activity: Not on file  Other Topics Concern   Not on file  Social History Narrative   Not on file   Social Determinants of Health   Financial Resource Strain: Not on file  Food Insecurity: Not on file  Transportation Needs: Not on file  Physical Activity: Not on file  Stress: Not on file  Social Connections: Not on file  Intimate Partner Violence: Not on file    Past Surgical History:  Procedure Laterality Date   BELOW KNEE LEG AMPUTATION Right    CHOLECYSTECTOMY     WISDOM TOOTH EXTRACTION        Current Outpatient Medications:    acetaminophen (TYLENOL) 500 MG tablet, Take 500 mg by mouth every 6 (six) hours as  needed for mild pain or fever. , Disp: , Rfl:    Alcohol Swabs (ALCOHOL WIPES) 70 % PADS, Use to wipe skin to check CBG, Disp: 100 each, Rfl: 0   Ascorbic Acid (VITAMIN C) 1000 MG tablet, Take 1,000 mg by mouth daily., Disp: , Rfl:    aspirin 81 MG chewable tablet, Chew 81 mg by mouth daily., Disp: , Rfl:    atorvastatin (LIPITOR) 40 MG tablet, Take 1 tablet (40 mg total) by mouth daily., Disp: 90 tablet, Rfl: 0   blood glucose meter kit and supplies KIT, Dispense based on patient and insurance preference. Use up to four times daily as directed. (FOR ICD-9 250.00, 250.01)., Disp: 1 each, Rfl: 0   famotidine (PEPCID) 20 MG tablet, Take 40 mg by mouth as needed. , Disp: , Rfl:    meclizine (ANTIVERT) 12.5 MG tablet, Take 1 tablet (12.5 mg total) by mouth 3 (three) times daily as needed for dizziness. (Patient not taking: Reported on 11/18/2020), Disp: 30 tablet, Rfl:  0   metFORMIN (GLUCOPHAGE) 500 MG tablet, Take 1 tablet (500 mg total) by mouth 2 (two) times daily with a meal., Disp: 180 tablet, Rfl: 1   Omega-3 Fatty Acids (FISH OIL PO), Take by mouth daily., Disp: , Rfl:    tamsulosin (FLOMAX) 0.4 MG CAPS capsule, TAKE 1 CAPSULE BY MOUTH DAILY AFTER SUPPER., Disp: 90 capsule, Rfl: 0   ursodiol (ACTIGALL) 250 MG tablet, TAKE 2 TABLETS BY MOUTH ONCE A DAY, Disp: 180 tablet, Rfl: 0   valsartan-hydrochlorothiazide (DIOVAN-HCT) 320-25 MG tablet, Take 1 tablet by mouth daily., Disp: 90 tablet, Rfl: 1   VITAMIN D PO, Take 5,000 Units by mouth daily., Disp: , Rfl:     Physical Exam: There were no vitals taken for this visit.   Affect appropriate Healthy:  appears stated age 65: normal Neck supple with no adenopathy JVP normal no bruits no thyromegaly Lungs clear with no wheezing and good diaphragmatic motion Heart:  S1/S2 no murmur, no rub, gallop or click PMI normal Abdomen: benighn, BS positve, no tenderness, no AAA no bruit.  No HSM or HJR Distal pulses intact with no bruits No edema Neuro non-focal Skin warm and dry Post right BKA    Labs:   Lab Results  Component Value Date   WBC 6.6 11/18/2020   HGB 15.8 11/18/2020   HCT 46.8 11/18/2020   MCV 91 11/18/2020   PLT 204 11/18/2020   No results for input(s): NA, K, CL, CO2, BUN, CREATININE, CALCIUM, PROT, BILITOT, ALKPHOS, ALT, AST, GLUCOSE in the last 168 hours.  Invalid input(s): LABALBU No results found for: CKTOTAL, CKMB, CKMBINDEX, TROPONINI  Lab Results  Component Value Date   CHOL 121 11/18/2020   CHOL 167 05/20/2020   CHOL 117 06/25/2019   Lab Results  Component Value Date   HDL 51 11/18/2020   HDL 46 05/20/2020   HDL 38 (L) 06/25/2019   Lab Results  Component Value Date   LDLCALC 54 11/18/2020   LDLCALC 98 05/20/2020   LDLCALC 52 06/25/2019   Lab Results  Component Value Date   TRIG 80 11/18/2020   TRIG 130 05/20/2020   TRIG 158 (H) 06/25/2019   Lab  Results  Component Value Date   CHOLHDL 2.4 11/18/2020   CHOLHDL 3.6 05/20/2020   CHOLHDL 3.1 06/25/2019   No results found for: LDLDIRECT    Radiology: No results found.   EKG: SR LAFB PaC no RV strain    ASSESSMENT AND PLAN:  1. PE:  Related to hypercoagulability state during COVID 19 infection CT reviewed 04/09/19 with mild clot burden subsegmental RLL Would think that 6 months of anticoagulation would be sufficient.  Now off anticoagulation  2. CVA:  During hypercoagulable state CT head 11/24 normal MRI 05/01/19 with left MCA territory micro emboli no carotid disease on CTA and no PAF during hospitalization Only residual deficit is right hand dexterity   3. HLD:  Continue statin labs with primary LDL at goal   4. HTN:  Well controlled.  Continue current medications and low sodium Dash type diet.   5. DVT:  resolved by duplex done 05/27/20   6. CAD:  risk with higher than average calcium score ASA and statin LDL at goal With DM discussed having F/u cardiac CTA or perfusion study in 3 years   7. DM:  Discussed low carb diet.  Target hemoglobin A1c is 6.5 or less.  Continue current medications.z A1c 6.5 11/18/20    F/U with cardiology PRN    Signed: Jenkins Rouge 01/02/2021, 9:33 AM

## 2021-01-05 ENCOUNTER — Ambulatory Visit: Payer: 59 | Admitting: Cardiovascular Disease

## 2021-01-05 ENCOUNTER — Encounter: Payer: Self-pay | Admitting: Cardiovascular Disease

## 2021-01-05 ENCOUNTER — Other Ambulatory Visit: Payer: Self-pay

## 2021-01-05 VITALS — BP 100/68 | HR 81 | Ht 65.5 in | Wt 225.0 lb

## 2021-01-05 DIAGNOSIS — I2510 Atherosclerotic heart disease of native coronary artery without angina pectoris: Secondary | ICD-10-CM | POA: Diagnosis not present

## 2021-01-05 DIAGNOSIS — Z86711 Personal history of pulmonary embolism: Secondary | ICD-10-CM | POA: Diagnosis not present

## 2021-01-05 DIAGNOSIS — E782 Mixed hyperlipidemia: Secondary | ICD-10-CM | POA: Diagnosis not present

## 2021-01-05 DIAGNOSIS — I10 Essential (primary) hypertension: Secondary | ICD-10-CM

## 2021-01-05 NOTE — Patient Instructions (Signed)
Medication Instructions:  *If you need a refill on your cardiac medications before your next appointment, please call your pharmacy*  Lab Work: If you have labs (blood work) drawn today and your tests are completely normal, you will receive your results only by: [place] (if you have MyChart) OR A paper copy in the mail If you have any lab test that is abnormal or we need to change your treatment, we will call you to review the results.  Testing/Procedures: None ordered today.  Follow-Up: At [facility], you and your health needs are our priority.  As part of our continuing mission to provide you with exceptional heart care, we have created designated Provider Care Teams.  These Care Teams include your primary Cardiologist (physician) and Advanced Practice Providers (APPs -  Physician Assistants and Nurse Practitioners) who all work together to provide you with the care you need, when you need it.  We recommend signing up for the patient portal called "MyChart".  Sign up information is provided on this After Visit Summary.  MyChart is used to connect with patients for Virtual Visits (Telemedicine).  Patients are able to view lab/test results, encounter notes, upcoming appointments, etc.  Non-urgent messages can be sent to your provider as well.   To learn more about what you can do with MyChart, go to [email or web address].    Your next appointment:   As needed  The format for your next appointment:   In Person  Provider:   You may see Dr. Johnsie Cancel or one of the following Advanced Practice Providers on your designated Care Team:   Cecilie Kicks, NP

## 2021-01-07 ENCOUNTER — Other Ambulatory Visit: Payer: Self-pay | Admitting: Nurse Practitioner

## 2021-01-07 ENCOUNTER — Other Ambulatory Visit (HOSPITAL_COMMUNITY): Payer: Self-pay

## 2021-01-07 MED ORDER — TAMSULOSIN HCL 0.4 MG PO CAPS
ORAL_CAPSULE | ORAL | 0 refills | Status: DC
  Filled 2021-01-07: qty 90, 90d supply, fill #0

## 2021-01-07 MED ORDER — URSODIOL 250 MG PO TABS
ORAL_TABLET | Freq: Every day | ORAL | 0 refills | Status: DC
  Filled 2021-01-07: qty 180, 90d supply, fill #0

## 2021-01-09 ENCOUNTER — Other Ambulatory Visit (HOSPITAL_COMMUNITY): Payer: Self-pay

## 2021-01-22 DIAGNOSIS — H524 Presbyopia: Secondary | ICD-10-CM | POA: Diagnosis not present

## 2021-01-22 DIAGNOSIS — H5213 Myopia, bilateral: Secondary | ICD-10-CM | POA: Diagnosis not present

## 2021-01-22 DIAGNOSIS — H52223 Regular astigmatism, bilateral: Secondary | ICD-10-CM | POA: Diagnosis not present

## 2021-01-25 IMAGING — CT CT ANGIO CHEST
2 of 8 series · 18 of 46 positions shown · IV contrast (OMNIPAQUE 350)
Comparison: CT scan dated 04/09/2019

CLINICAL DATA: History of 35L22-LE pneumonia. Pulmonary embolism in
March 2019.

EXAM:
CT ANGIOGRAPHY CHEST WITH CONTRAST
TECHNIQUE: Multidetector CT imaging of the chest was performed using the
standard protocol during bolus administration of intravenous
contrast. Multiplanar CT image reconstructions and MIPs were
obtained to evaluate the vascular anatomy.
CONTRAST:  80mL OMNIPAQUE IOHEXOL 350 MG/ML SOLN

[Series 5: thins · axial · 0.80mm/px · z∈[-321,-67]mm · 15 of 286 slices shown]
[im 16/286  lung]
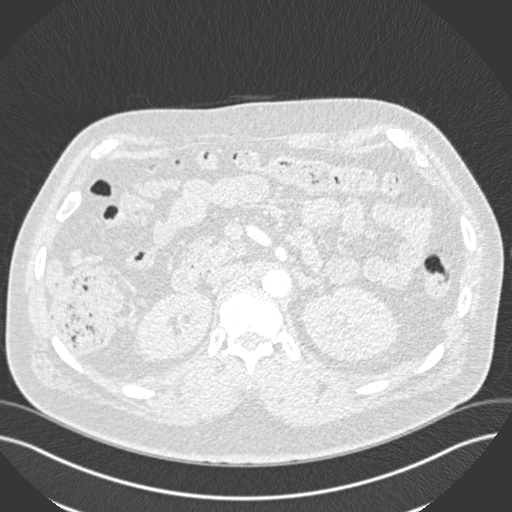
[im 32/286  soft-tissue]
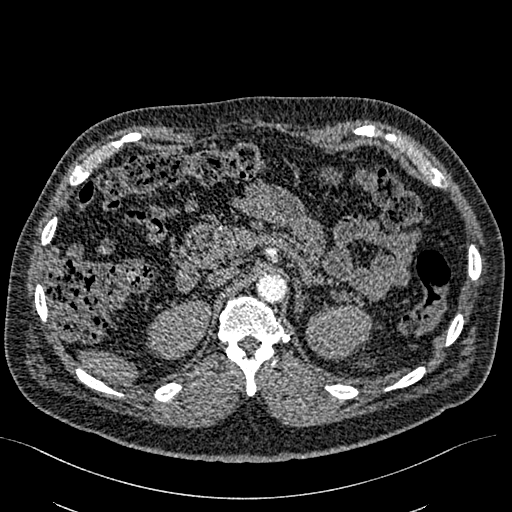
[im 48/286  lung]
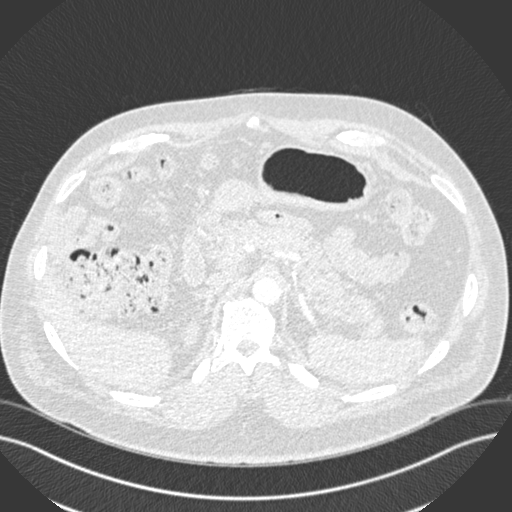
[im 64/286  soft-tissue]
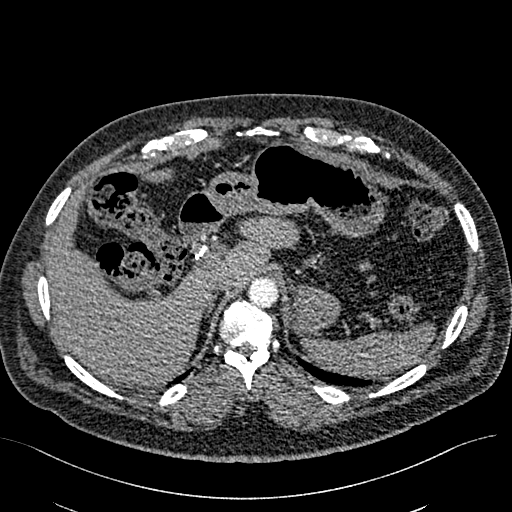
[im 96/286  lung]
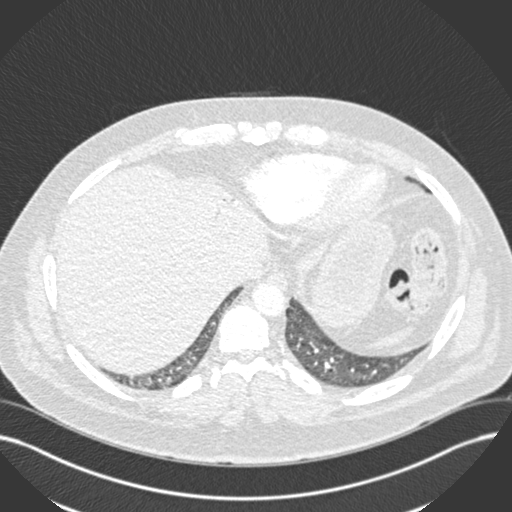
[im 111/286  soft-tissue]
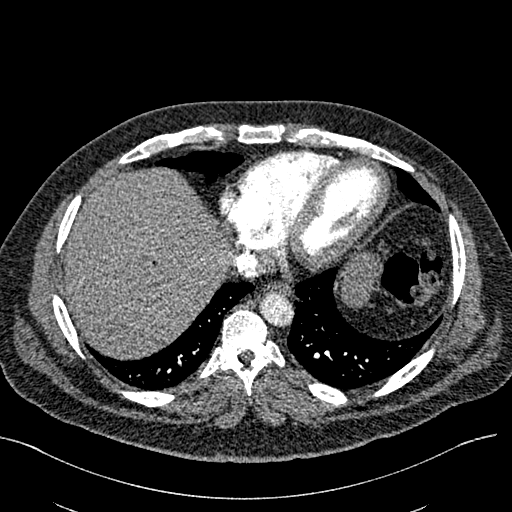
[im 127/286  lung]
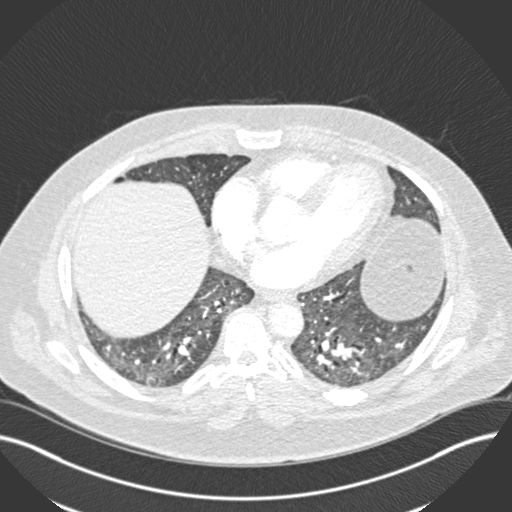
[im 143/286  soft-tissue]
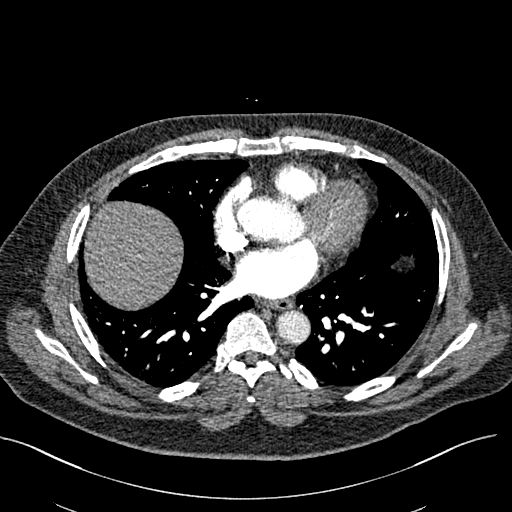
[im 159/286  lung]
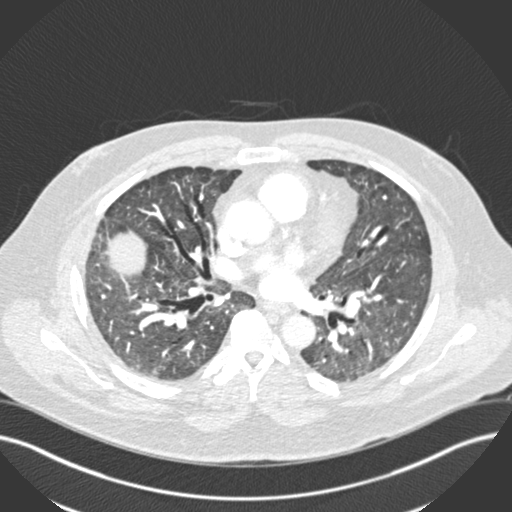
[im 175/286  soft-tissue]
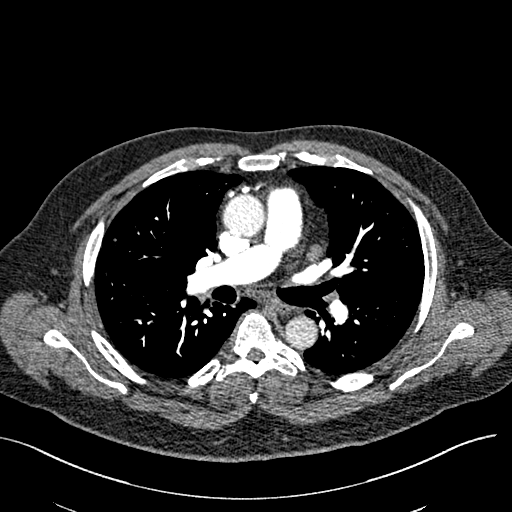
[im 191/286  lung]
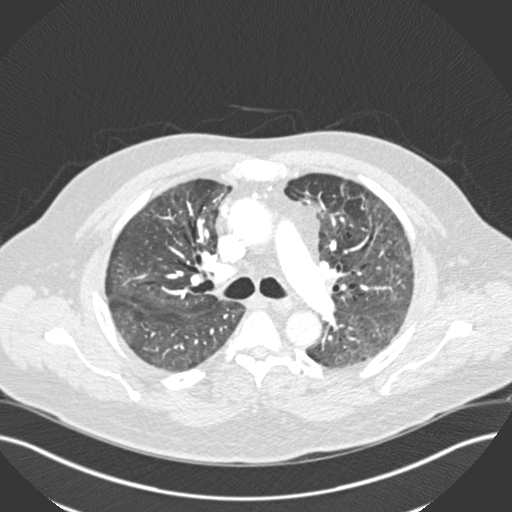
[im 222/286  soft-tissue]
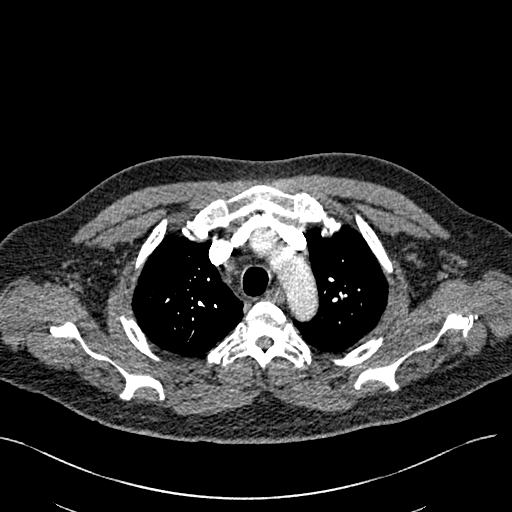
[im 238/286  lung]
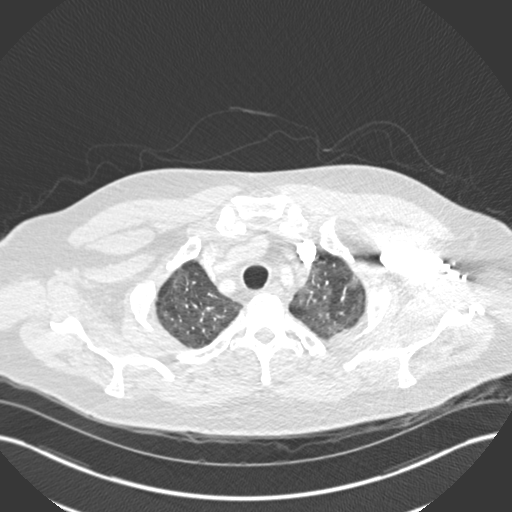
[im 254/286  soft-tissue]
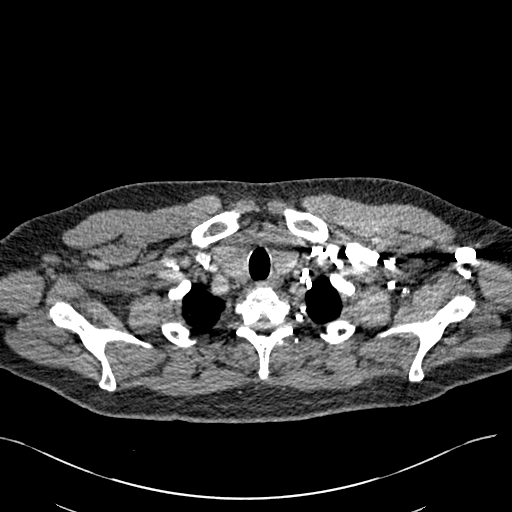
[im 270/286  lung]
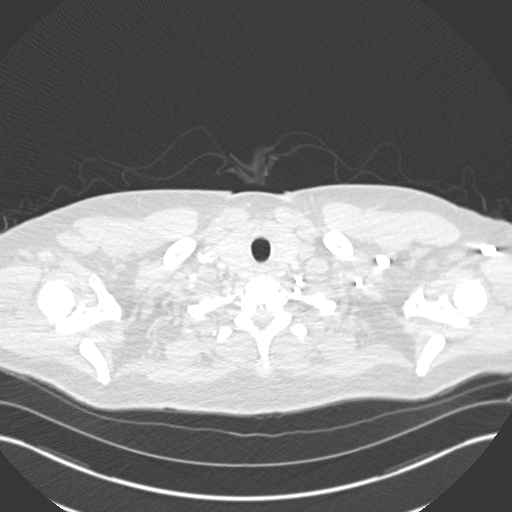

[Series 7: coronal mpr · coronal · 0.59mm/px · 3 of 119 slices shown]
[im 30/119  soft-tissue]
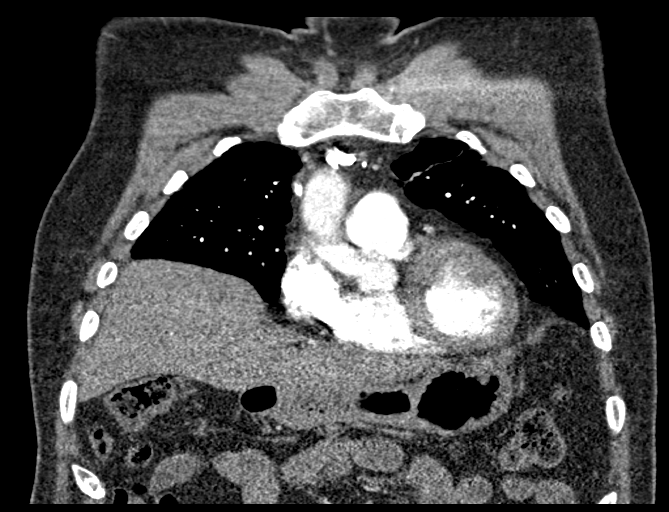
[im 60/119  soft-tissue]
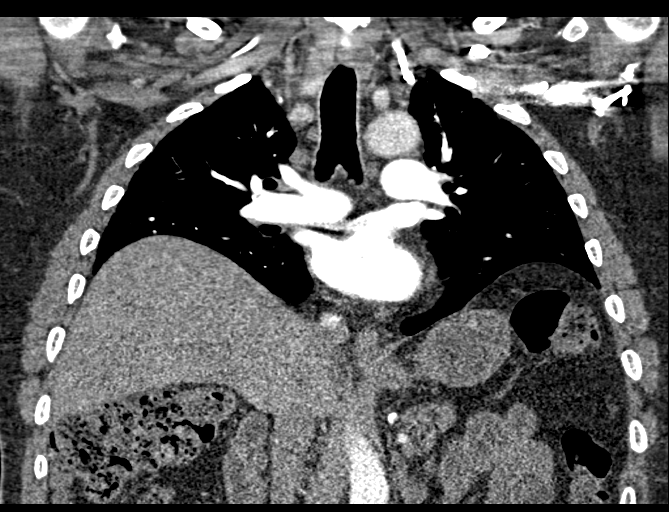
[im 89/119  soft-tissue]
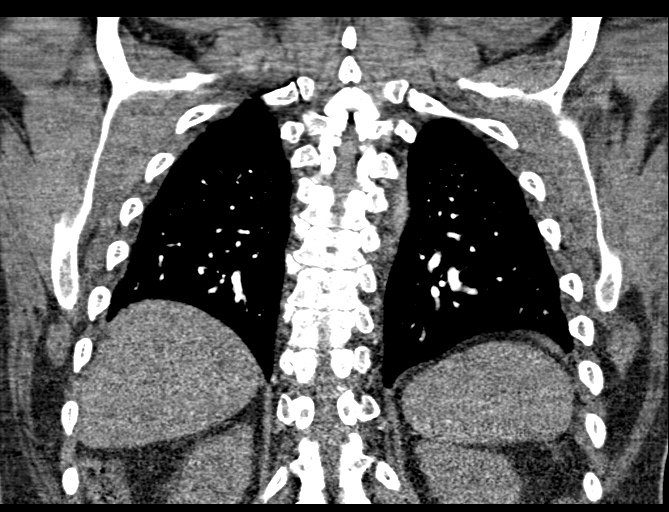

[18 of 46 positions shown; findings below may reference images not displayed]

FINDINGS: Cardiovascular: The bilateral pulmonary emboli present on the prior
exam have completely resolved. No new or residual emboli. Heart size
is normal. RV LV ratio is normal. Minimal aortic atherosclerosis. No
pericardial effusion.

Mediastinum/Nodes: No enlarged mediastinal, hilar, or axillary lymph
nodes. Thyroid gland, trachea, and esophagus demonstrate no
significant findings.

Lungs/Pleura: The diffuse parenchymal haziness in both lungs has
improved but not resolved. No effusions. No pulmonary nodules.

Upper Abdomen: No acute abnormality. Tiny stone in the upper pole of
the right kidney. Previous cholecystectomy. Chronic air in the
biliary tree.

Musculoskeletal: No chest wall abnormality. No acute or significant
osseous findings.

Review of the MIP images confirms the above findings.
IMPRESSION: 1. Complete resolution of bilateral pulmonary emboli.
2. No new or residual emboli.
3. Improved but not resolved diffuse bilateral pulmonary
infiltrates.
4. Tiny stone in the upper pole of the right kidney.

## 2021-02-04 ENCOUNTER — Other Ambulatory Visit (HOSPITAL_COMMUNITY): Payer: Self-pay

## 2021-02-09 DIAGNOSIS — E119 Type 2 diabetes mellitus without complications: Secondary | ICD-10-CM | POA: Diagnosis not present

## 2021-02-09 DIAGNOSIS — H2513 Age-related nuclear cataract, bilateral: Secondary | ICD-10-CM | POA: Diagnosis not present

## 2021-02-09 LAB — HM DIABETES EYE EXAM

## 2021-02-13 ENCOUNTER — Encounter: Payer: Self-pay | Admitting: Nurse Practitioner

## 2021-03-09 ENCOUNTER — Other Ambulatory Visit (HOSPITAL_COMMUNITY): Payer: Self-pay

## 2021-03-09 ENCOUNTER — Other Ambulatory Visit: Payer: Self-pay | Admitting: Nurse Practitioner

## 2021-03-09 DIAGNOSIS — E782 Mixed hyperlipidemia: Secondary | ICD-10-CM

## 2021-03-09 MED ORDER — ATORVASTATIN CALCIUM 40 MG PO TABS
40.0000 mg | ORAL_TABLET | Freq: Every day | ORAL | 0 refills | Status: DC
  Filled 2021-03-09: qty 90, 90d supply, fill #0

## 2021-03-24 ENCOUNTER — Ambulatory Visit: Payer: 59 | Admitting: Nurse Practitioner

## 2021-03-24 ENCOUNTER — Encounter: Payer: Self-pay | Admitting: Nurse Practitioner

## 2021-03-24 ENCOUNTER — Other Ambulatory Visit: Payer: Self-pay

## 2021-03-24 ENCOUNTER — Other Ambulatory Visit (HOSPITAL_COMMUNITY): Payer: Self-pay

## 2021-03-24 VITALS — BP 110/70 | HR 94 | Temp 98.0°F | Ht 66.0 in | Wt 223.4 lb

## 2021-03-24 DIAGNOSIS — E782 Mixed hyperlipidemia: Secondary | ICD-10-CM

## 2021-03-24 DIAGNOSIS — Z23 Encounter for immunization: Secondary | ICD-10-CM

## 2021-03-24 DIAGNOSIS — R7309 Other abnormal glucose: Secondary | ICD-10-CM

## 2021-03-24 DIAGNOSIS — Z6836 Body mass index (BMI) 36.0-36.9, adult: Secondary | ICD-10-CM | POA: Diagnosis not present

## 2021-03-24 DIAGNOSIS — I10 Essential (primary) hypertension: Secondary | ICD-10-CM

## 2021-03-24 LAB — CMP14+EGFR
ALT: 14 IU/L (ref 0–44)
AST: 19 IU/L (ref 0–40)
Albumin/Globulin Ratio: 1.7 (ref 1.2–2.2)
Albumin: 4.5 g/dL (ref 3.8–4.8)
Alkaline Phosphatase: 75 IU/L (ref 44–121)
BUN/Creatinine Ratio: 14 (ref 10–24)
BUN: 15 mg/dL (ref 8–27)
Bilirubin Total: 1.2 mg/dL (ref 0.0–1.2)
CO2: 25 mmol/L (ref 20–29)
Calcium: 9.6 mg/dL (ref 8.6–10.2)
Chloride: 99 mmol/L (ref 96–106)
Creatinine, Ser: 1.1 mg/dL (ref 0.76–1.27)
Globulin, Total: 2.6 g/dL (ref 1.5–4.5)
Glucose: 107 mg/dL — ABNORMAL HIGH (ref 70–99)
Potassium: 4.2 mmol/L (ref 3.5–5.2)
Sodium: 138 mmol/L (ref 134–144)
Total Protein: 7.1 g/dL (ref 6.0–8.5)
eGFR: 74 mL/min/{1.73_m2} (ref >59)

## 2021-03-24 LAB — LIPID PANEL
Chol/HDL Ratio: 2.7 ratio (ref 0.0–5.0)
Cholesterol, Total: 126 mg/dL (ref 100–199)
HDL: 46 mg/dL (ref >39)
LDL Chol Calc (NIH): 63 mg/dL (ref 0–99)
Triglycerides: 87 mg/dL (ref 0–149)
VLDL Cholesterol Cal: 17 mg/dL (ref 5–40)

## 2021-03-24 LAB — HEMOGLOBIN A1C
Est. average glucose Bld gHb Est-mCnc: 134 mg/dL
Hgb A1c MFr Bld: 6.3 % — ABNORMAL HIGH (ref 4.8–5.6)

## 2021-03-24 MED ORDER — SHINGRIX 50 MCG/0.5ML IM SUSR
0.5000 mL | Freq: Once | INTRAMUSCULAR | 0 refills | Status: AC
  Filled 2021-03-24 – 2021-12-30 (×2): qty 0.5, 1d supply, fill #0

## 2021-03-24 NOTE — Patient Instructions (Signed)

## 2021-03-24 NOTE — Progress Notes (Signed)
I,Yamilka J Llittleton,acting as a Education administrator for [employer], FNP.,have documented all relevant documentation on the behalf of [name], FNP,as directed by  [name], FNP while in the presence of [name], [place].   This visit occurred during the SARS-CoV-2 public health emergency.  Safety protocols were in place, including screening questions prior to the visit, additional usage of staff PPE, and extensive cleaning of exam room while observing appropriate contact time as indicated for disinfecting solutions.  Subjective:     Patient ID: [name] , male    DOB: [birth year] , 65 y.o.   MRN: [id number]   Chief Complaint  Patient presents with   prediabetes f/u   Hypertension    HPI  Patient presents today for a prediabetes and bp f/u  Wt Readings from Last 3 Encounters: 03/24/21 : 223 lb 6.4 oz (101.3 kg) 01/05/21 : 225 lb (102.1 kg) 11/18/20 : 220 lb 9.6 oz (100.1 kg)   He is walking for exercise about 20 minutes M-F, then on the weekend will try to walk 30 minutes.    Hypertension This is a chronic problem. The current episode started more than 1 year ago. The problem is unchanged. The problem is controlled. Pertinent negatives include no anxiety, chest pain, headaches or palpitations. There are no associated agents to hypertension. Risk factors for coronary artery disease include obesity and male gender. There are no compliance problems.  There is no history of angina.  Diabetes Pertinent negatives for hypoglycemia include no headaches. Pertinent negatives for diabetes include no chest pain. There are no hypoglycemic complications. There are no diabetic complications. Risk factors for coronary artery disease include male sex, obesity and diabetes mellitus. He has not had a previous visit with a dietitian. He participates in exercise intermittently. (Blood sugar 98-132, fasting 113 average.  )    Past Medical History:  Diagnosis Date   Diabetes mellitus without  complication (HCC)    GERD (gastroesophageal reflux disease)    Hx of right BKA (HCC)    uses prosthetic    Hyperlipidemia    Hypertension    Liver disorder    tx with ursodiol      Family History  Problem Relation Age of Onset   Colon polyps Father    Colon cancer Neg Hx    Rectal cancer Neg Hx    Stomach cancer Neg Hx      Current Outpatient Medications:    acetaminophen (TYLENOL) 500 MG tablet, Take 500 mg by mouth every 6 (six) hours as needed for mild pain or fever. , Disp: , Rfl:    Alcohol Swabs (ALCOHOL WIPES) 70 % PADS, Use to wipe skin to check CBG, Disp: 100 each, Rfl: 0   Ascorbic Acid (VITAMIN C) 1000 MG tablet, Take 1,000 mg by mouth daily., Disp: , Rfl:    aspirin 81 MG chewable tablet, Chew 81 mg by mouth daily., Disp: , Rfl:    atorvastatin (LIPITOR) 40 MG tablet, Take 1 tablet (40 mg total) by mouth daily., Disp: 90 tablet, Rfl: 0   blood glucose meter kit and supplies KIT, Dispense based on patient and insurance preference. Use up to four times daily as directed. (FOR ICD-9 250.00, 250.01)., Disp: 1 each, Rfl: 0   famotidine (PEPCID) 20 MG tablet, Take 40 mg by mouth as needed. , Disp: , Rfl:    meclizine (ANTIVERT) 12.5 MG tablet, Take 1 tablet (12.5 mg total) by mouth 3 (three) times daily as needed for dizziness., Disp: 30  tablet, Rfl: 0   metFORMIN (GLUCOPHAGE) 500 MG tablet, Take 1 tablet (500 mg total) by mouth 2 (two) times daily with a meal., Disp: 180 tablet, Rfl: 1   Omega-3 Fatty Acids (FISH OIL PO), Take by mouth daily., Disp: , Rfl:    tamsulosin (FLOMAX) 0.4 MG CAPS capsule, TAKE 1 CAPSULE BY MOUTH DAILY AFTER SUPPER., Disp: 90 capsule, Rfl: 0   ursodiol (ACTIGALL) 250 MG tablet, TAKE 2 TABLETS BY MOUTH ONCE A DAY, Disp: 180 tablet, Rfl: 0   valsartan-hydrochlorothiazide (DIOVAN-HCT) 320-25 MG tablet, Take 1 tablet by mouth daily., Disp: 90 tablet, Rfl: 1   VITAMIN D PO, Take 5,000 Units by mouth daily., Disp: , Rfl:    No Known Allergies   Review  of Systems  Constitutional: Negative.   Respiratory: Negative.    Cardiovascular: Negative.  Negative for chest pain, palpitations and leg swelling.  Gastrointestinal: Negative.   Neurological: Negative.  Negative for headaches.  Psychiatric/Behavioral: Negative.      Today's Vitals   03/24/21 0844  BP: 110/70  Pulse: 94  Temp: 98 F (36.7 C)  Weight: 223 lb 6.4 oz (101.3 kg)  Height: $Remove'5\' 6"'[id number]$  (1.676 m)  PainSc: 0-No pain   Body mass index is 36.06 kg/m.   Objective:  Physical Exam Vitals reviewed.  Constitutional:      General: He is not in acute distress.    Appearance: Normal appearance. He is obese.  Cardiovascular:     Rate and Rhythm: Normal rate.     Pulses: Normal pulses.     Heart sounds: Normal heart sounds. No murmur heard. Pulmonary:     Effort: Pulmonary effort is normal. No respiratory distress.     Breath sounds: Normal breath sounds. No wheezing.  Musculoskeletal:     Comments: Right AKA with prosthesis  Skin:    General: Skin is warm and dry.     Capillary Refill: Capillary refill takes less than 2 seconds.  Neurological:     General: No focal deficit present.     Mental Status: He is alert and oriented to person, place, and time.     Cranial Nerves: No cranial nerve deficit.  Psychiatric:        Mood and Affect: Mood normal.        Behavior: Behavior normal.        Thought Content: Thought content normal.        Judgment: Judgment normal.        Assessment And Plan:     1. Abnormal glucose Comments: Will recheck his HgbA1c he may have now transitioned to Type 2 diabetes pending lab results.  Continue regular exercise and healthy diet - CMP14+EGFR - Hemoglobin A1c  2. Essential hypertension Comments: Blood pressure is normal, continue current medications - CMP14+EGFR  3. Mixed hyperlipidemia Comments: Well controlled, continue current medications  - Lipid panel  4. Class 2 severe obesity due to excess calories with serious comorbidity  and body mass index (BMI) of 36.0 to 36.9 in adult [facility]) Chronic Discussed healthy diet and regular exercise options  Encouraged to exercise at least 150 minutes per week with 2 days of strength training as tolerated.   5. Immunization due Comments: He is to sign a medical release for records to check for his immunizations. Will send 2nd Shingrix.     Patient was given opportunity to ask questions. Patient verbalized understanding of the plan and was able to repeat key elements of the plan. All questions were answered to  their satisfaction.  [name], FNP   I, [name], FNP, have reviewed all documentation for this visit. The documentation on 03/24/21 for the exam, diagnosis, procedures, and orders are all accurate and complete.   IF YOU HAVE BEEN REFERRED TO A SPECIALIST, IT MAY TAKE 1-2 WEEKS TO SCHEDULE/PROCESS THE REFERRAL. IF YOU HAVE NOT HEARD FROM US/SPECIALIST IN TWO WEEKS, PLEASE GIVE [place] A CALL AT [phone].   THE PATIENT IS ENCOURAGED TO PRACTICE SOCIAL DISTANCING DUE TO THE COVID-19 PANDEMIC.

## 2021-04-13 DIAGNOSIS — Z89511 Acquired absence of right leg below knee: Secondary | ICD-10-CM | POA: Diagnosis not present

## 2021-04-14 ENCOUNTER — Encounter: Payer: Self-pay | Admitting: Nurse Practitioner

## 2021-04-15 ENCOUNTER — Other Ambulatory Visit: Payer: Self-pay | Admitting: Nurse Practitioner

## 2021-04-15 ENCOUNTER — Other Ambulatory Visit (HOSPITAL_COMMUNITY): Payer: Self-pay

## 2021-04-15 MED ORDER — TAMSULOSIN HCL 0.4 MG PO CAPS
ORAL_CAPSULE | ORAL | 0 refills | Status: DC
  Filled 2021-04-15: qty 90, 90d supply, fill #0

## 2021-04-17 ENCOUNTER — Other Ambulatory Visit (HOSPITAL_COMMUNITY): Payer: Self-pay

## 2021-04-28 ENCOUNTER — Ambulatory Visit: Payer: 59 | Attending: Internal Medicine

## 2021-04-28 ENCOUNTER — Other Ambulatory Visit: Payer: Self-pay

## 2021-04-28 DIAGNOSIS — Z23 Encounter for immunization: Secondary | ICD-10-CM

## 2021-04-28 MED ORDER — MODERNA COVID-19 BIVAL BOOSTER 50 MCG/0.5ML IM SUSP
INTRAMUSCULAR | 0 refills | Status: DC
  Filled 2021-04-28: qty 0.5, 1d supply, fill #0

## 2021-04-28 NOTE — Progress Notes (Signed)
   Covid-19 Vaccination Clinic  Name:  [name]    MRN: [id number] DOB: Apr 01, 1956  04/28/2021  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 He will be observed by other healthcare professionals while there. He was provided with Vaccine Information Sheet and instruction to access the V-Safe system.   [name] was instructed to call 911 with any severe reactions post vaccine: Difficulty breathing  Swelling of face and throat  A fast heartbeat  A bad rash all over body  Dizziness and weakness   Immunizations Administered     Name Date Dose VIS Date Route   Moderna Covid-19 vaccine Bivalent Booster 04/28/2021 11:05 AM 0.5 mL 01/03/2021 Intramuscular   Manufacturer: Levan Hurst   Lot: [id number]   NDC: [place], PharmD, MBA Clinical Acute Care Pharmacist

## 2021-05-11 ENCOUNTER — Other Ambulatory Visit: Payer: Self-pay | Admitting: Nurse Practitioner

## 2021-05-11 ENCOUNTER — Other Ambulatory Visit (HOSPITAL_COMMUNITY): Payer: Self-pay

## 2021-05-12 ENCOUNTER — Encounter: Payer: Self-pay | Admitting: Nurse Practitioner

## 2021-05-12 ENCOUNTER — Other Ambulatory Visit (HOSPITAL_COMMUNITY): Payer: Self-pay

## 2021-05-12 MED ORDER — URSODIOL 250 MG PO TABS
ORAL_TABLET | Freq: Every day | ORAL | 0 refills | Status: DC
  Filled 2021-05-12: qty 180, 90d supply, fill #0

## 2021-05-12 MED ORDER — VALSARTAN-HYDROCHLOROTHIAZIDE 320-25 MG PO TABS
1.0000 | ORAL_TABLET | Freq: Every day | ORAL | 1 refills | Status: DC
  Filled 2021-05-12: qty 90, 90d supply, fill #0
  Filled 2021-10-29: qty 90, 90d supply, fill #1

## 2021-05-13 ENCOUNTER — Other Ambulatory Visit (HOSPITAL_COMMUNITY): Payer: Self-pay

## 2021-05-19 DIAGNOSIS — Z89511 Acquired absence of right leg below knee: Secondary | ICD-10-CM | POA: Diagnosis not present

## 2021-06-19 ENCOUNTER — Other Ambulatory Visit (HOSPITAL_COMMUNITY): Payer: Self-pay

## 2021-06-19 ENCOUNTER — Other Ambulatory Visit: Payer: Self-pay | Admitting: Nurse Practitioner

## 2021-06-19 DIAGNOSIS — E782 Mixed hyperlipidemia: Secondary | ICD-10-CM

## 2021-06-19 MED ORDER — ATORVASTATIN CALCIUM 40 MG PO TABS
40.0000 mg | ORAL_TABLET | Freq: Every day | ORAL | 1 refills | Status: DC
  Filled 2021-06-19: qty 90, 90d supply, fill #0
  Filled 2021-09-21: qty 90, 90d supply, fill #1

## 2021-07-22 ENCOUNTER — Ambulatory Visit: Payer: 59 | Admitting: Nurse Practitioner

## 2021-07-23 ENCOUNTER — Encounter: Payer: Self-pay | Admitting: Nurse Practitioner

## 2021-07-23 ENCOUNTER — Other Ambulatory Visit: Payer: Self-pay | Admitting: Nurse Practitioner

## 2021-07-23 ENCOUNTER — Other Ambulatory Visit (HOSPITAL_COMMUNITY): Payer: Self-pay

## 2021-07-23 MED ORDER — TAMSULOSIN HCL 0.4 MG PO CAPS
ORAL_CAPSULE | ORAL | 0 refills | Status: DC
  Filled 2021-07-23: qty 90, 90d supply, fill #0

## 2021-08-10 ENCOUNTER — Other Ambulatory Visit (HOSPITAL_COMMUNITY): Payer: Self-pay

## 2021-08-10 ENCOUNTER — Other Ambulatory Visit: Payer: Self-pay | Admitting: Nurse Practitioner

## 2021-08-10 MED ORDER — METFORMIN HCL 500 MG PO TABS
500.0000 mg | ORAL_TABLET | Freq: Two times a day (BID) | ORAL | 1 refills | Status: DC
  Filled 2021-08-10: qty 180, 90d supply, fill #0
  Filled 2021-10-29: qty 180, 90d supply, fill #1

## 2021-08-20 LAB — BASIC METABOLIC PANEL
BUN: 15 (ref 4–21)
CO2: 23 — AB (ref 13–22)
Chloride: 103 (ref 99–108)
Creatinine: 1.2 (ref 0.6–1.3)
Glucose: 118
Potassium: 4.4 mEq/L (ref 3.5–5.1)
Sodium: 140 (ref 137–147)

## 2021-08-20 LAB — HEPATIC FUNCTION PANEL
ALT: 15 U/L (ref 10–40)
AST: 23 (ref 14–40)
Alkaline Phosphatase: 65 (ref 25–125)
Bilirubin, Total: 0.6

## 2021-08-20 LAB — TSH: TSH: 0.78 (ref 0.41–5.90)

## 2021-08-20 LAB — COMPREHENSIVE METABOLIC PANEL
Albumin: 4.3 (ref 3.5–5.0)
Calcium: 9.2 (ref 8.7–10.7)
Globulin: 2.6
eGFR: 69

## 2021-08-20 LAB — CBC AND DIFFERENTIAL
HCT: 44 (ref 41–53)
Hemoglobin: 14.8 (ref 13.5–17.5)
Neutrophils Absolute: 3.9
Platelets: 208 10*3/uL (ref 150–400)
WBC: 6.6

## 2021-08-20 LAB — LIPID PANEL
Cholesterol: 107 (ref 0–200)
HDL: 44 (ref 35–70)
LDL Cholesterol: 46
Triglycerides: 89 (ref 40–160)

## 2021-08-20 LAB — HEMOGLOBIN A1C: Hemoglobin A1C: 6.2

## 2021-08-20 LAB — CBC: RBC: 4.73 (ref 3.87–5.11)

## 2021-09-21 ENCOUNTER — Other Ambulatory Visit (HOSPITAL_COMMUNITY): Payer: Self-pay

## 2021-09-21 ENCOUNTER — Other Ambulatory Visit: Payer: Self-pay | Admitting: Nurse Practitioner

## 2021-09-21 MED ORDER — URSODIOL 250 MG PO TABS
ORAL_TABLET | Freq: Every day | ORAL | 0 refills | Status: DC
  Filled 2021-09-21: qty 180, 90d supply, fill #0

## 2021-09-22 ENCOUNTER — Other Ambulatory Visit (HOSPITAL_COMMUNITY): Payer: Self-pay

## 2021-10-29 ENCOUNTER — Other Ambulatory Visit: Payer: Self-pay | Admitting: Nurse Practitioner

## 2021-10-29 ENCOUNTER — Other Ambulatory Visit (HOSPITAL_COMMUNITY): Payer: Self-pay

## 2021-10-29 MED ORDER — TAMSULOSIN HCL 0.4 MG PO CAPS
ORAL_CAPSULE | ORAL | 0 refills | Status: DC
  Filled 2021-10-29: qty 90, 90d supply, fill #0

## 2021-11-19 ENCOUNTER — Ambulatory Visit (INDEPENDENT_AMBULATORY_CARE_PROVIDER_SITE_OTHER): Payer: 59 | Admitting: Nurse Practitioner

## 2021-11-19 ENCOUNTER — Encounter: Payer: Self-pay | Admitting: Nurse Practitioner

## 2021-11-19 ENCOUNTER — Other Ambulatory Visit: Payer: Self-pay

## 2021-11-19 VITALS — BP 110/64 | HR 67 | Temp 98.1°F | Ht 66.0 in | Wt 207.0 lb

## 2021-11-19 DIAGNOSIS — Z0000 Encounter for general adult medical examination without abnormal findings: Secondary | ICD-10-CM | POA: Diagnosis not present

## 2021-11-19 DIAGNOSIS — E669 Obesity, unspecified: Secondary | ICD-10-CM | POA: Diagnosis not present

## 2021-11-19 DIAGNOSIS — E6609 Other obesity due to excess calories: Secondary | ICD-10-CM

## 2021-11-19 DIAGNOSIS — Z125 Encounter for screening for malignant neoplasm of prostate: Secondary | ICD-10-CM | POA: Diagnosis not present

## 2021-11-19 DIAGNOSIS — Z6833 Body mass index (BMI) 33.0-33.9, adult: Secondary | ICD-10-CM | POA: Diagnosis not present

## 2021-11-19 DIAGNOSIS — E782 Mixed hyperlipidemia: Secondary | ICD-10-CM | POA: Diagnosis not present

## 2021-11-19 DIAGNOSIS — Z79899 Other long term (current) drug therapy: Secondary | ICD-10-CM

## 2021-11-19 DIAGNOSIS — Z89619 Acquired absence of unspecified leg above knee: Secondary | ICD-10-CM | POA: Diagnosis not present

## 2021-11-19 DIAGNOSIS — I10 Essential (primary) hypertension: Secondary | ICD-10-CM

## 2021-11-19 DIAGNOSIS — E1169 Type 2 diabetes mellitus with other specified complication: Secondary | ICD-10-CM

## 2021-11-19 DIAGNOSIS — Z23 Encounter for immunization: Secondary | ICD-10-CM

## 2021-11-19 LAB — POCT URINALYSIS DIPSTICK
Bilirubin, UA: NEGATIVE
Blood, UA: NEGATIVE
Glucose, UA: NEGATIVE
Ketones, UA: NEGATIVE
Leukocytes, UA: NEGATIVE
Nitrite, UA: NEGATIVE
Protein, UA: NEGATIVE
Spec Grav, UA: 1.02 (ref 1.010–1.025)
Urobilinogen, UA: 0.2 E.U./dL
pH, UA: 5.5 (ref 5.0–8.0)

## 2021-11-19 NOTE — Progress Notes (Signed)
This visit occurred during the SARS-CoV-2 public health emergency.  Safety protocols were in place, including screening questions prior to the visit, additional usage of staff PPE, and extensive cleaning of exam room while observing appropriate contact time as indicated for disinfecting solutions.  Subjective:     Patient ID: [name] , male    DOB: August 18, 1955 , 66 y.o.   MRN: [id number]   Chief Complaint  Patient presents with   Annual Exam    HPI  Patient is here today for his annual exam. He has no other complaints today.     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 , Disp: , Rfl:    Alcohol Swabs (ALCOHOL WIPES) 70 % PADS, Use to wipe skin to check CBG, Disp: 100 each, Rfl: 0   Ascorbic Acid (VITAMIN C) 1000 MG tablet, Take 1,000 mg by mouth daily., Disp: , Rfl:    aspirin 81 MG chewable tablet, Chew 81 mg by mouth daily., Disp: , Rfl:    atorvastatin (LIPITOR) 40 MG tablet, Take 1 tablet (40 mg total) by mouth daily., Disp: 90 tablet, Rfl: 1   blood glucose meter kit and supplies KIT, Dispense based on patient and insurance preference. Use up  to four times daily as directed. (FOR ICD-9 250.00, 250.01)., Disp: 1 each, Rfl: 0   COVID-19 mRNA bivalent vaccine, Moderna, (MODERNA COVID-19 BIVAL BOOSTER) 50 MCG/0.5ML injection, Inject into the muscle., Disp: 0.5 mL, Rfl: 0   famotidine (PEPCID) 20 MG tablet, Take 40 mg by mouth as needed. , Disp: , Rfl:    metFORMIN (GLUCOPHAGE) 500 MG tablet, Take 1 tablet (500 mg total) by mouth 2 (two) times daily with a meal., Disp: 180 tablet, Rfl: 1   Omega-3 Fatty Acids (FISH OIL PO), Take by mouth daily., Disp: , Rfl:    tamsulosin (FLOMAX) 0.4 MG CAPS capsule, TAKE 1 CAPSULE BY MOUTH DAILY AFTER SUPPER., Disp: 90 capsule, Rfl: 0   ursodiol (ACTIGALL) 250 MG tablet, TAKE 2 TABLETS BY MOUTH ONCE A DAY, Disp: 180 tablet, Rfl: 0   valsartan-hydrochlorothiazide (DIOVAN-HCT) 320-25 MG tablet, Take 1 tablet by mouth daily., Disp: 90 tablet, Rfl: 1   VITAMIN D PO, Take 5,000 Units by mouth daily., Disp: , Rfl:    meclizine (ANTIVERT) 12.5 MG tablet, Take 1 tablet (12.5 mg total) by mouth 3 (three) times daily as needed for dizziness. (Patient not taking: Reported on 11/19/2021), Disp: 30 tablet, Rfl: 0   No Known Allergies  Men's preventive visit. Patient Health Questionnaire (PHQ-2) is  [place] Office Visit from 11/19/2021 in [facility]  PHQ-2 Total Score 0      Patient is on a regular diet, decreasing his intake of carbohydrates. Exercising 3-4 days a week at least 15-30 minutes each Marital status: Married. Relevant history for alcohol use is:  Social History   Substance and Sexual Activity  Alcohol Use No   Relevant history for tobacco use is:  Social History   Tobacco Use  Smoking Status Never  Smokeless Tobacco Never  .   Review of Systems  Constitutional: Negative.   HENT: Negative.    Eyes: Negative.   Respiratory: Negative.         When he gets excited will see that he has to take a deep breath but feels his breathing has improved.   Cardiovascular:  Negative.   Gastrointestinal: Negative.   Endocrine: Negative.   Genitourinary: Negative.   Musculoskeletal:        Right AKA  Skin: Negative.   Allergic/Immunologic: Negative.   Neurological: Negative.        Continues to have some intermittent brain fog from Covid.   Hematological: Negative.   Psychiatric/Behavioral: Negative.       Today's Vitals   11/19/21 0930  BP: 110/64  Pulse: 67  Temp: 98.1 F (36.7 C)  TempSrc: Oral  Weight: 207 lb (93.9 kg)  Height: 5' 6" (1.676 m)   Body mass index is 33.41 kg/m.   Objective:  Physical Exam Vitals reviewed.  Constitutional:      General: He is not in acute distress.    Appearance: Normal appearance. He is obese.  Cardiovascular:     Rate and Rhythm: Normal rate.     Pulses: Normal pulses.     Heart sounds: Normal heart sounds. No murmur heard. Pulmonary:     Effort: Pulmonary effort is normal. No respiratory distress.     Breath sounds: Normal breath sounds. No wheezing.  Musculoskeletal:     Comments: Right AKA with prosthesis  Skin:    General: Skin is warm and dry.     Capillary Refill: Capillary refill takes less than 2 seconds.  Neurological:     General: No focal deficit present.     Mental Status: He is alert and oriented to person, place, and time.     Cranial Nerves: No cranial nerve deficit.  Psychiatric:        Mood and Affect: Mood normal.        Behavior: Behavior normal.        Thought Content: Thought content normal.        Judgment: Judgment normal.         Assessment And Plan:    1. Annual physical exam Behavior modifications discussed and diet history reviewed.   Pt will continue to exercise regularly and modify diet with low GI, plant based foods and decrease intake of processed foods.  Recommend intake of daily multivitamin, Vitamin D, and calcium.  Recommend colonoscopy (up to date) for preventive screenings, as well as recommend immunizations that include influenza, TDAP, and Shingles  (1st dose this visit)  2. Essential hypertension Comments: Blood pressure is well controlled, continue current medications - EKG 12-Lead - Microalbumin / Creatinine Urine Ratio - POCT Urinalysis Dipstick (81002)  3. Mixed hyperlipidemia Comments: Stable, continue statin, tolerating well.   4. Diabetes mellitus type 2 in obese [facility]) Comments: HgbA1c is improving, continue current medications. - EKG  12-Lead - CMP14+EGFR - Hemoglobin A1c  5. BMI 33.0-33.9,adult  6. Other long term (current) drug therapy - CBC  7. Encounter for prostate cancer screening  8. History of amputation of lower extremity (HCC)     Patient was given opportunity to ask questions. Patient verbalized understanding of the plan and was able to repeat key elements of the plan. All questions were answered to their satisfaction.   Janece Moore, FNP   I, Janece Moore, FNP, have reviewed all documentation for this visit. The documentation on 11/19/21 for the exam, diagnosis, procedures, and orders are all accurate and complete.   THE PATIENT IS ENCOURAGED TO PRACTICE SOCIAL DISTANCING DUE TO THE COVID-19 PANDEMIC.   

## 2021-11-19 NOTE — Patient Instructions (Signed)
Hypertension, Adult ?Hypertension is another name for high blood pressure. High blood pressure forces your heart to work harder to pump blood. This can cause problems over time. ?There are two numbers in a blood pressure reading. There is a top number (systolic) over a bottom number (diastolic). It is best to have a blood pressure that is below 120/80. ?What are the causes? ?The cause of this condition is not known. Some other conditions can lead to high blood pressure. ?What increases the risk? ?Some lifestyle factors can make you more likely to develop high blood pressure: ?Smoking. ?Not getting enough exercise or physical activity. ?Being overweight. ?Having too much fat, sugar, calories, or salt (sodium) in your diet. ?Drinking too much alcohol. ?Other risk factors include: ?Having any of these conditions: ?Heart disease. ?Diabetes. ?High cholesterol. ?Kidney disease. ?Obstructive sleep apnea. ?Having a family history of high blood pressure and high cholesterol. ?Age. The risk increases with age. ?Stress. ?What are the signs or symptoms? ?High blood pressure may not cause symptoms. Very high blood pressure (hypertensive crisis) may cause: ?Headache. ?Fast or uneven heartbeats (palpitations). ?Shortness of breath. ?Nosebleed. ?Vomiting or feeling like you may vomit (nauseous). ?Changes in how you see. ?Very bad chest pain. ?Feeling dizzy. ?Seizures. ?How is this treated? ?This condition is treated by making healthy lifestyle changes, such as: ?Eating healthy foods. ?Exercising more. ?Drinking less alcohol. ?Your doctor may prescribe medicine if lifestyle changes do not help enough and if: ?Your top number is above 130. ?Your bottom number is above 80. ?Your personal target blood pressure may vary. ?Follow these instructions at home: ?Eating and drinking ? ?If told, follow the DASH eating plan. To follow this plan: ?Fill one half of your plate at each meal with fruits and vegetables. ?Fill one fourth of your plate  at each meal with whole grains. Whole grains include whole-wheat pasta, brown rice, and whole-grain bread. ?Eat or drink low-fat dairy products, such as skim milk or low-fat yogurt. ?Fill one fourth of your plate at each meal with low-fat (lean) proteins. Low-fat proteins include fish, chicken without skin, eggs, beans, and tofu. ?Avoid fatty meat, cured and processed meat, or chicken with skin. ?Avoid pre-made or processed food. ?Limit the amount of salt in your diet to less than 1,500 mg each day. ?Do not drink alcohol if: ?Your doctor tells you not to drink. ?You are pregnant, may be pregnant, or are planning to become pregnant. ?If you drink alcohol: ?Limit how much you have to: ?0-1 drink a day for women. ?0-2 drinks a day for men. ?Know how much alcohol is in your drink. In the [place] one drink equals one 12 oz bottle of beer (355 mL), one 5 oz glass of wine (148 mL), or one 1? oz glass of hard liquor (44 mL). ?Lifestyle ? ?Work with your doctor to stay at a healthy weight or to lose weight. Ask your doctor what the best weight is for you. ?Get at least 30 minutes of exercise that causes your heart to beat faster (aerobic exercise) most days of the week. This may include walking, swimming, or biking. ?Get at least 30 minutes of exercise that strengthens your muscles (resistance exercise) at least 3 days a week. This may include lifting weights or doing Pilates. ?Do not smoke or use any products that contain nicotine or tobacco. If you need help quitting, ask your doctor. ?Check your blood pressure at home as told by your doctor. ?Keep all follow-up visits. ?Medicines ?Take over-the-counter and prescription medicines   only as told by your doctor. Follow directions carefully. ?Do not skip doses of blood pressure medicine. The medicine does not work as well if you skip doses. Skipping doses also puts you at risk for problems. ?Ask your doctor about side effects or reactions to medicines that you should watch  for. ?Contact a doctor if: ?You think you are having a reaction to the medicine you are taking. ?You have headaches that keep coming back. ?You feel dizzy. ?You have swelling in your ankles. ?You have trouble with your vision. ?Get help right away if: ?You get a very bad headache. ?You start to feel mixed up (confused). ?You feel weak or numb. ?You feel faint. ?You have very bad pain in your: ?Chest. ?Belly (abdomen). ?You vomit more than once. ?You have trouble breathing. ?These symptoms may be an emergency. Get help right away. Call 911. ?Do not wait to see if the symptoms will go away. ?Do not drive yourself to the hospital. ?Summary ?Hypertension is another name for high blood pressure. ?High blood pressure forces your heart to work harder to pump blood. ?For most people, a normal blood pressure is less than 120/80. ?Making healthy choices can help lower blood pressure. If your blood pressure does not get lower with healthy choices, you may need to take medicine. ?This information is not intended to replace advice given to you by your health care provider. Make sure you discuss any questions you have with your health care provider. ?Document Revised: 02/26/2021 Document Reviewed: 02/26/2021 ?Elsevier Patient Education ? 2023 Elsevier Inc. ? ?

## 2021-11-20 LAB — CBC
Hematocrit: 45.7 % (ref 37.5–51.0)
Hemoglobin: 14.7 g/dL (ref 13.0–17.7)
MCH: 30.1 pg (ref 26.6–33.0)
MCHC: 32.2 g/dL (ref 31.5–35.7)
MCV: 94 fL (ref 79–97)
Platelets: 211 10*3/uL (ref 150–450)
RBC: 4.88 x10E6/uL (ref 4.14–5.80)
RDW: 13.3 % (ref 11.6–15.4)
WBC: 6.5 10*3/uL (ref 3.4–10.8)

## 2021-11-20 LAB — CMP14+EGFR
ALT: 17 IU/L (ref 0–44)
AST: 21 IU/L (ref 0–40)
Albumin/Globulin Ratio: 1.6 (ref 1.2–2.2)
Albumin: 4.5 g/dL (ref 3.8–4.8)
Alkaline Phosphatase: 71 IU/L (ref 44–121)
BUN/Creatinine Ratio: 12 (ref 10–24)
BUN: 13 mg/dL (ref 8–27)
Bilirubin Total: 1 mg/dL (ref 0.0–1.2)
CO2: 25 mmol/L (ref 20–29)
Calcium: 9.6 mg/dL (ref 8.6–10.2)
Chloride: 101 mmol/L (ref 96–106)
Creatinine, Ser: 1.12 mg/dL (ref 0.76–1.27)
Globulin, Total: 2.8 g/dL (ref 1.5–4.5)
Glucose: 91 mg/dL (ref 70–99)
Potassium: 4.4 mmol/L (ref 3.5–5.2)
Sodium: 140 mmol/L (ref 134–144)
Total Protein: 7.3 g/dL (ref 6.0–8.5)
eGFR: 73 mL/min/{1.73_m2} (ref >59)

## 2021-11-20 LAB — MICROALBUMIN / CREATININE URINE RATIO
Creatinine, Urine: 89.8 mg/dL
Microalb/Creat Ratio: 15 mg/g creat (ref 0–29)
Microalbumin, Urine: 13.2 ug/mL

## 2021-11-20 LAB — HEMOGLOBIN A1C
Est. average glucose Bld gHb Est-mCnc: 126 mg/dL
Hgb A1c MFr Bld: 6 % — ABNORMAL HIGH (ref 4.8–5.6)

## 2021-12-30 ENCOUNTER — Other Ambulatory Visit: Payer: Self-pay

## 2021-12-30 ENCOUNTER — Other Ambulatory Visit: Payer: Self-pay | Admitting: Nurse Practitioner

## 2021-12-30 ENCOUNTER — Other Ambulatory Visit (HOSPITAL_COMMUNITY): Payer: Self-pay

## 2021-12-30 DIAGNOSIS — E782 Mixed hyperlipidemia: Secondary | ICD-10-CM

## 2021-12-30 MED ORDER — ATORVASTATIN CALCIUM 40 MG PO TABS
40.0000 mg | ORAL_TABLET | Freq: Every day | ORAL | 1 refills | Status: DC
  Filled 2021-12-30: qty 90, 90d supply, fill #0
  Filled 2022-04-13: qty 90, 90d supply, fill #1

## 2021-12-31 ENCOUNTER — Other Ambulatory Visit (HOSPITAL_COMMUNITY): Payer: Self-pay

## 2022-01-07 ENCOUNTER — Ambulatory Visit: Payer: 59 | Admitting: Podiatry

## 2022-01-07 DIAGNOSIS — L600 Ingrowing nail: Secondary | ICD-10-CM | POA: Diagnosis not present

## 2022-01-07 NOTE — Progress Notes (Signed)
Subjective:  Patient ID: [name], male    DOB: [birth year],  MRN: [id number]  Chief Complaint  Patient presents with   Ingrown Toenail    66 y.o. male presents with the above complaint.  Patient presents with left medial border ingrown.  Patient states pain for touch is progressive gotten worse hurts with ambulation hurts with pressure.  He would like to have it removed he has not seen anyone else prior to seeing me.  He denies any other acute complaints he has history of BKA to the right side.   Review of Systems: Negative except as noted in the HPI. Denies N/V/F/Ch.  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 , Disp: , Rfl:    Alcohol Swabs (ALCOHOL WIPES) 70 % PADS, Use to wipe skin to check CBG, Disp: 100 each, Rfl: 0   Ascorbic Acid (VITAMIN C) 1000 MG tablet, Take 1,000 mg by mouth daily., Disp: , Rfl:    aspirin 81 MG chewable tablet, Chew 81 mg by mouth daily., Disp: , Rfl:    atorvastatin (LIPITOR) 40 MG tablet, Take 1 tablet (40 mg total) by mouth daily., Disp: 90 tablet, Rfl: 1   blood glucose meter kit and supplies KIT, Dispense based on patient and insurance preference. Use up to four times daily as directed. (FOR ICD-9 250.00, 250.01)., Disp: 1 each, Rfl: 0   COVID-19 mRNA bivalent vaccine,  Moderna, (MODERNA COVID-19 BIVAL BOOSTER) 50 MCG/0.5ML injection, Inject into the muscle., Disp: 0.5 mL, Rfl: 0   famotidine (PEPCID) 20 MG tablet, Take 40 mg by mouth as needed. , Disp: , Rfl:    meclizine (ANTIVERT) 12.5 MG tablet, Take 1 tablet (12.5 mg total) by mouth 3 (three) times daily as needed for dizziness., Disp: 30 tablet, Rfl: 0   metFORMIN (GLUCOPHAGE) 500 MG tablet, Take 1 tablet (500 mg total) by mouth 2 (two) times daily with a meal., Disp: 180 tablet, Rfl: 1   Omega-3 Fatty Acids (FISH OIL PO), Take by mouth daily., Disp: , Rfl:    tamsulosin (FLOMAX) 0.4 MG CAPS capsule, TAKE 1 CAPSULE BY MOUTH DAILY AFTER SUPPER., Disp: 90 capsule, Rfl: 0   ursodiol (ACTIGALL) 250 MG tablet, TAKE 2 TABLETS BY MOUTH ONCE A DAY, Disp: 180 tablet, Rfl: 0   valsartan-hydrochlorothiazide (DIOVAN-HCT) 320-25 MG tablet, Take 1 tablet by mouth daily., Disp: 90 tablet, Rfl: 1   VITAMIN D PO, Take 5,000 Units by mouth daily., Disp: , Rfl:   Social History   Tobacco Use  Smoking Status Never  Smokeless Tobacco Never    No Known Allergies Objective:  There were no vitals filed for this visit. There is no height or weight on file to calculate BMI. Constitutional Well  developed. Well nourished.  Vascular Dorsalis pedis pulses palpable left Posterior tibial pulses palpable left Capillary refill normal to all remain the digits.  No cyanosis or clubbing noted. Pedal hair growth present  Neurologic Normal speech. Oriented to person, place, and time. Epicritic sensation to light touch grossly present left  Dermatologic Painful ingrowing nail at lateral nail borders of the hallux nail left. No other open wounds. No skin lesions.  Orthopedic: Normal joint ROM without pain or crepitus left.  BKA on the right side No visible deformities. No bony tenderness.   Radiographs: None Assessment:   1. Ingrown left big toenail    Plan:  Patient was evaluated and treated and all questions  answered.  Ingrown Nail, left -Patient elects to proceed with minor surgery to remove ingrown toenail removal today. Consent reviewed and signed by patient. -Ingrown nail excised. See procedure note. -Educated on post-procedure care including soaking. Written instructions provided and reviewed. -Patient to follow up in 2 weeks for nail check.  Procedure: Excision of Ingrown Toenail Location: Left 1st toe lateral nail borders. Anesthesia: Lidocaine 1% plain; 1.5 mL and [place] 0.5% plain; 1.5 mL, digital block. Skin Prep: Betadine. Dressing: Silvadene; telfa; dry, sterile, compression dressing. Technique: Following skin prep, the toe was exsanguinated and a tourniquet was secured at the base of the toe. The affected nail border was freed, split with a nail splitter, and excised. Chemical matrixectomy was then performed with phenol and irrigated out with alcohol. The tourniquet was then removed and sterile dressing applied. Disposition: Patient tolerated procedure well. Patient to return in 2 weeks for follow-up.   No follow-ups on file.

## 2022-01-19 ENCOUNTER — Other Ambulatory Visit (HOSPITAL_COMMUNITY): Payer: Self-pay

## 2022-01-19 ENCOUNTER — Other Ambulatory Visit: Payer: Self-pay | Admitting: Nurse Practitioner

## 2022-01-19 MED ORDER — URSODIOL 250 MG PO TABS
ORAL_TABLET | Freq: Every day | ORAL | 0 refills | Status: DC
Start: 2022-01-19 — End: 2022-04-13
  Filled 2022-01-19: qty 180, 90d supply, fill #0

## 2022-01-20 ENCOUNTER — Other Ambulatory Visit (HOSPITAL_COMMUNITY): Payer: Self-pay

## 2022-02-09 ENCOUNTER — Encounter: Payer: Self-pay | Admitting: Nurse Practitioner

## 2022-02-09 ENCOUNTER — Other Ambulatory Visit: Payer: Self-pay | Admitting: Nurse Practitioner

## 2022-02-09 ENCOUNTER — Other Ambulatory Visit (HOSPITAL_COMMUNITY): Payer: Self-pay

## 2022-02-09 MED ORDER — TAMSULOSIN HCL 0.4 MG PO CAPS
ORAL_CAPSULE | ORAL | 0 refills | Status: DC
  Filled 2022-02-09: qty 90, 90d supply, fill #0

## 2022-02-09 MED ORDER — METFORMIN HCL 500 MG PO TABS
500.0000 mg | ORAL_TABLET | Freq: Two times a day (BID) | ORAL | 1 refills | Status: DC
  Filled 2022-02-09: qty 180, 90d supply, fill #0
  Filled 2022-05-05: qty 180, 90d supply, fill #1

## 2022-02-18 ENCOUNTER — Encounter: Payer: Self-pay | Admitting: Nurse Practitioner

## 2022-02-23 ENCOUNTER — Other Ambulatory Visit: Payer: Self-pay | Admitting: Nurse Practitioner

## 2022-02-25 DIAGNOSIS — E119 Type 2 diabetes mellitus without complications: Secondary | ICD-10-CM | POA: Diagnosis not present

## 2022-02-25 LAB — HM DIABETES EYE EXAM

## 2022-03-05 ENCOUNTER — Encounter: Payer: Self-pay | Admitting: Nurse Practitioner

## 2022-04-02 ENCOUNTER — Encounter: Payer: Self-pay | Admitting: Nurse Practitioner

## 2022-04-13 ENCOUNTER — Other Ambulatory Visit: Payer: Self-pay | Admitting: Nurse Practitioner

## 2022-04-13 ENCOUNTER — Other Ambulatory Visit (HOSPITAL_COMMUNITY): Payer: Self-pay

## 2022-04-13 MED ORDER — URSODIOL 250 MG PO TABS
500.0000 mg | ORAL_TABLET | Freq: Every day | ORAL | 0 refills | Status: DC
  Filled 2022-04-13: qty 180, 90d supply, fill #0

## 2022-04-14 ENCOUNTER — Encounter: Payer: Self-pay | Admitting: Gastroenterology

## 2022-04-26 DIAGNOSIS — Z89511 Acquired absence of right leg below knee: Secondary | ICD-10-CM | POA: Diagnosis not present

## 2022-05-05 ENCOUNTER — Other Ambulatory Visit: Payer: Self-pay

## 2022-05-05 ENCOUNTER — Other Ambulatory Visit: Payer: Self-pay | Admitting: Nurse Practitioner

## 2022-05-05 MED ORDER — VALSARTAN-HYDROCHLOROTHIAZIDE 320-25 MG PO TABS
1.0000 | ORAL_TABLET | Freq: Every day | ORAL | 1 refills | Status: DC
  Filled 2022-05-05: qty 90, 90d supply, fill #0
  Filled 2022-09-15: qty 90, 90d supply, fill #1

## 2022-05-06 ENCOUNTER — Other Ambulatory Visit: Payer: Self-pay

## 2022-05-31 ENCOUNTER — Other Ambulatory Visit: Payer: Self-pay | Admitting: Nurse Practitioner

## 2022-05-31 ENCOUNTER — Other Ambulatory Visit (HOSPITAL_COMMUNITY): Payer: Self-pay

## 2022-05-31 ENCOUNTER — Other Ambulatory Visit: Payer: Self-pay

## 2022-05-31 MED ORDER — TAMSULOSIN HCL 0.4 MG PO CAPS
0.4000 mg | ORAL_CAPSULE | Freq: Every day | ORAL | 0 refills | Status: DC
  Filled 2022-05-31: qty 90, 90d supply, fill #0

## 2022-07-26 ENCOUNTER — Other Ambulatory Visit: Payer: Self-pay | Admitting: Nurse Practitioner

## 2022-07-26 ENCOUNTER — Other Ambulatory Visit (HOSPITAL_COMMUNITY): Payer: Self-pay

## 2022-07-26 ENCOUNTER — Other Ambulatory Visit: Payer: Self-pay

## 2022-07-26 DIAGNOSIS — E782 Mixed hyperlipidemia: Secondary | ICD-10-CM

## 2022-07-26 MED ORDER — ATORVASTATIN CALCIUM 40 MG PO TABS
40.0000 mg | ORAL_TABLET | Freq: Every day | ORAL | 1 refills | Status: DC
  Filled 2022-07-26: qty 90, 90d supply, fill #0
  Filled 2022-10-22: qty 90, 90d supply, fill #1

## 2022-08-26 ENCOUNTER — Other Ambulatory Visit: Payer: Self-pay | Admitting: Nurse Practitioner

## 2022-08-26 ENCOUNTER — Other Ambulatory Visit: Payer: Self-pay

## 2022-08-26 MED ORDER — METFORMIN HCL 500 MG PO TABS
500.0000 mg | ORAL_TABLET | Freq: Two times a day (BID) | ORAL | 1 refills | Status: DC
  Filled 2022-08-26: qty 180, 90d supply, fill #0
  Filled 2022-11-30: qty 180, 90d supply, fill #1

## 2022-09-02 ENCOUNTER — Other Ambulatory Visit (HOSPITAL_COMMUNITY): Payer: Self-pay

## 2022-09-02 ENCOUNTER — Other Ambulatory Visit: Payer: Self-pay | Admitting: Nurse Practitioner

## 2022-09-02 ENCOUNTER — Other Ambulatory Visit: Payer: Self-pay

## 2022-09-02 MED ORDER — TAMSULOSIN HCL 0.4 MG PO CAPS
0.4000 mg | ORAL_CAPSULE | Freq: Every day | ORAL | 0 refills | Status: DC
  Filled 2022-09-02: qty 90, 90d supply, fill #0

## 2022-09-02 MED ORDER — URSODIOL 250 MG PO TABS
500.0000 mg | ORAL_TABLET | Freq: Every day | ORAL | 0 refills | Status: DC
  Filled 2022-09-02: qty 180, 90d supply, fill #0

## 2022-09-15 ENCOUNTER — Other Ambulatory Visit (HOSPITAL_COMMUNITY): Payer: Self-pay

## 2022-10-05 ENCOUNTER — Ambulatory Visit: Payer: Commercial Managed Care - PPO | Admitting: Nurse Practitioner

## 2022-10-05 ENCOUNTER — Encounter: Payer: Self-pay | Admitting: Nurse Practitioner

## 2022-10-05 VITALS — BP 140/80 | HR 85 | Temp 98.1°F | Ht 66.0 in | Wt 203.6 lb

## 2022-10-05 DIAGNOSIS — N182 Chronic kidney disease, stage 2 (mild): Secondary | ICD-10-CM

## 2022-10-05 DIAGNOSIS — Z1211 Encounter for screening for malignant neoplasm of colon: Secondary | ICD-10-CM | POA: Diagnosis not present

## 2022-10-05 DIAGNOSIS — Z89619 Acquired absence of unspecified leg above knee: Secondary | ICD-10-CM

## 2022-10-05 DIAGNOSIS — Z23 Encounter for immunization: Secondary | ICD-10-CM

## 2022-10-05 DIAGNOSIS — E782 Mixed hyperlipidemia: Secondary | ICD-10-CM

## 2022-10-05 DIAGNOSIS — I129 Hypertensive chronic kidney disease with stage 1 through stage 4 chronic kidney disease, or unspecified chronic kidney disease: Secondary | ICD-10-CM | POA: Diagnosis not present

## 2022-10-05 DIAGNOSIS — S81801A Unspecified open wound, right lower leg, initial encounter: Secondary | ICD-10-CM

## 2022-10-05 DIAGNOSIS — R7309 Other abnormal glucose: Secondary | ICD-10-CM | POA: Insufficient documentation

## 2022-10-05 NOTE — Progress Notes (Signed)
Hershal Coria Martin,acting as a Neurosurgeon for Arnette Felts, FNP.,have documented all relevant documentation on the behalf of Arnette Felts, FNP,as directed by  Arnette Felts, FNP while in the presence of Arnette Felts, FNP.    Subjective:     Patient ID: [name] , male    DOB: March 12, 1956 , 67 y.o.   MRN: [id number]   Chief Complaint  Patient presents with   Wound Check    HPI  Patient presents today for a wound on his right leg where he has wear and tear of his amputation. Patient report mild pain, trace blood. In February had some irritation at the tip of tibia, had new liners. This helped some and the liners are breaking down again. He went to the prosthetist. Trying to stay off of it, will get a scab but then will open back up. He now has a silicone cap, he has to get the wound healed first. He has been applying alcohol and antibiotic cream. He was concerned the ointment may break down the gel.   His stump has atrophied and changed over time.   BP Readings from Last 3 Encounters: 10/05/22 : (!) 140/80 11/19/21 : 110/64 03/24/21 : 110/70       Past Medical History:  Diagnosis Date   Acute ischemic left MCA stroke (HCC) 05/02/2019   AKI (acute kidney injury) (HCC) 04/09/2019   Community acquired pneumonia 04/09/2019   Diabetes mellitus without complication (HCC)    GERD (gastroesophageal reflux disease)    Hx of right BKA (HCC)    uses prosthetic    Hyperglycemia 04/09/2019   Hyperlipidemia    Hypertension    Left leg DVT (HCC) 09/22/2019   Liver disorder    tx with ursodiol    Lobar pneumonia (HCC)    Pneumonia due to COVID-19 virus 04/09/2019   Severe sepsis (HCC) 04/09/2019   Transaminitis 04/09/2019     Family History  Problem Relation Age of Onset   Colon polyps Father    Colon cancer Neg Hx    Rectal cancer Neg Hx    Stomach cancer Neg Hx      Current Outpatient Medications:    acetaminophen (TYLENOL) 500 MG tablet, Take 500 mg by mouth every 6 (six)  hours as needed for mild pain or fever. , Disp: , Rfl:    Alcohol Swabs (ALCOHOL WIPES) 70 % PADS, Use to wipe skin to check CBG, Disp: 100 each, Rfl: 0   Ascorbic Acid (VITAMIN C) 1000 MG tablet, Take 1,000 mg by mouth daily., Disp: , Rfl:    aspirin 81 MG chewable tablet, Chew 81 mg by mouth daily., Disp: , Rfl:    atorvastatin (LIPITOR) 40 MG tablet, Take 1 tablet (40 mg total) by mouth daily., Disp: 90 tablet, Rfl: 1   blood glucose meter kit and supplies KIT, Dispense based on patient and insurance preference. Use up to four times daily as directed. (FOR ICD-9 250.00, 250.01)., Disp: 1 each, Rfl: 0   COVID-19 mRNA bivalent vaccine, Moderna, (MODERNA COVID-19 BIVAL BOOSTER) 50 MCG/0.5ML injection, Inject into the muscle., Disp: 0.5 mL, Rfl: 0   famotidine (PEPCID) 20 MG tablet, Take 40 mg by mouth as needed. , Disp: , Rfl:    meclizine (ANTIVERT) 12.5 MG tablet, Take 1 tablet (12.5 mg total) by mouth 3 (three) times daily as needed for dizziness., Disp: 30 tablet, Rfl: 0   metFORMIN (GLUCOPHAGE) 500 MG tablet, Take 1 tablet (500 mg total) by mouth 2 (two) times  daily with a meal., Disp: 180 tablet, Rfl: 1   Omega-3 Fatty Acids (FISH OIL PO), Take by mouth daily., Disp: , Rfl:    tamsulosin (FLOMAX) 0.4 MG CAPS capsule, Take 1 capsule (0.4 mg total) by mouth daily after supper., Disp: 90 capsule, Rfl: 0   ursodiol (ACTIGALL) 250 MG tablet, Take 2 tablets (500 mg total) by mouth daily., Disp: 180 tablet, Rfl: 0   valsartan-hydrochlorothiazide (DIOVAN-HCT) 320-25 MG tablet, Take 1 tablet by mouth daily., Disp: 90 tablet, Rfl: 1   VITAMIN D PO, Take 5,000 Units by mouth daily., Disp: , Rfl:    No Known Allergies   Review of Systems  Constitutional: Negative.   Respiratory: Negative.    Cardiovascular: Negative.   Skin:  Positive for wound (right amputation).  Neurological: Negative.   Psychiatric/Behavioral: Negative.       Today's Vitals   10/05/22 1106  BP: (!) 140/80  Pulse: 85   Temp: 98.1 F (36.7 C)  TempSrc: Oral  Weight: 203 lb 9.6 oz (92.4 kg)  Height: 5\' 6"  (1.676 m)  PainSc: 0-No pain   Body mass index is 32.86 kg/m.  BP Readings from Last 3 Encounters:  10/05/22 (!) 140/80  11/19/21 110/64  03/24/21 110/70    The ASCVD Risk score (Arnett DK, et al., 2019) failed to calculate for the following reasons:   The patient has a prior MI or stroke diagnosis ++ Objective:  Physical Exam Vitals reviewed.  Constitutional:      General: He is not in acute distress.    Appearance: Normal appearance. He is obese.  Cardiovascular:     Rate and Rhythm: Normal rate.     Pulses: Normal pulses.     Heart sounds: Normal heart sounds. No murmur heard. Pulmonary:     Effort: Pulmonary effort is normal. No respiratory distress.     Breath sounds: Normal breath sounds. No wheezing.  Musculoskeletal:     Comments: Right AKA with prosthesis  Skin:    General: Skin is warm and dry.     Capillary Refill: Capillary refill takes less than 2 seconds.     Comments: Right amputated stump with scabbed area at tibia and posterior leg. There is a small area  Neurological:     General: No focal deficit present.     Mental Status: He is alert and oriented to person, place, and time.     Cranial Nerves: No cranial nerve deficit.     Motor: No weakness.  Psychiatric:        Mood and Affect: Mood normal.        Behavior: Behavior normal.        Thought Content: Thought content normal.        Judgment: Judgment normal.         Assessment And Plan:     1. Wound of right leg, initial encounter Comments: Right amputated stump with scabbed area at tibia and posterior leg. There is a small area. Cleanse daily Apply light layer of vaseline and non adhesive daily - CBC  2. Benign hypertension with CKD (chronic kidney disease), stage II Comments: Blood pressure is fairly controlled, continue current medications. - BMP8+eGFR  3. Abnormal glucose Comments: HgbA1c was  elevated slightly at last visit, will recheck levels today due to poor healing wound to amputated leg/stump. - Hemoglobin A1c  4. Mixed hyperlipidemia Comments: Cholesterol levels are normal. Continue focusing on low fat diet.  5. History of amputation of lower extremity (HCC)  6. Encounter for screening colonoscopy - Ambulatory referral to Gastroenterology  7. Encounter for immunization - Pneumococcal conjugate vaccine 20-valent (Prevnar 20)    Return for 2-3 week visit f/u right leg wound.   Patient was given opportunity to ask questions. Patient verbalized understanding of the plan and was able to repeat key elements of the plan. All questions were answered to their satisfaction.  Arnette Felts, FNP   I, Arnette Felts, FNP, have reviewed all documentation for this visit. The documentation on 10/05/22 for the exam, diagnosis, procedures, and orders are all accurate and complete.   IF YOU HAVE BEEN REFERRED TO A SPECIALIST, IT MAY TAKE 1-2 WEEKS TO SCHEDULE/PROCESS THE REFERRAL. IF YOU HAVE NOT HEARD FROM US/SPECIALIST IN TWO WEEKS, PLEASE GIVE [place] A CALL AT [phone] X [phone].   THE PATIENT IS ENCOURAGED TO PRACTICE SOCIAL DISTANCING DUE TO THE COVID-19 PANDEMIC.

## 2022-10-05 NOTE — Patient Instructions (Addendum)
You can use a light layer of vaseline and apply a non adhesive dressing daily. Cleanse with warm antibacterial soap/water daily. Return to office in 2-3 weeks for f/u.

## 2022-10-06 LAB — CBC
Hematocrit: 44.6 % (ref 37.5–51.0)
Hemoglobin: 14.8 g/dL (ref 13.0–17.7)
MCH: 31.6 pg (ref 26.6–33.0)
MCHC: 33.2 g/dL (ref 31.5–35.7)
MCV: 95 fL (ref 79–97)
Platelets: 189 10*3/uL (ref 150–450)
RBC: 4.69 x10E6/uL (ref 4.14–5.80)
RDW: 12.8 % (ref 11.6–15.4)
WBC: 5.8 10*3/uL (ref 3.4–10.8)

## 2022-10-06 LAB — BMP8+EGFR
BUN/Creatinine Ratio: 11 (ref 10–24)
BUN: 10 mg/dL (ref 8–27)
CO2: 25 mmol/L (ref 20–29)
Calcium: 9.3 mg/dL (ref 8.6–10.2)
Chloride: 103 mmol/L (ref 96–106)
Creatinine, Ser: 0.94 mg/dL (ref 0.76–1.27)
Glucose: 84 mg/dL (ref 70–99)
Potassium: 4.5 mmol/L (ref 3.5–5.2)
Sodium: 142 mmol/L (ref 134–144)
eGFR: 89 mL/min/{1.73_m2} (ref >59)

## 2022-10-06 LAB — HEMOGLOBIN A1C
Est. average glucose Bld gHb Est-mCnc: 123 mg/dL
Hgb A1c MFr Bld: 5.9 % — ABNORMAL HIGH (ref 4.8–5.6)

## 2022-10-19 ENCOUNTER — Other Ambulatory Visit: Payer: Self-pay

## 2022-10-19 MED ORDER — BLOOD PRESSURE MONITOR DEVI
0 refills | Status: AC
  Filled 2022-10-19: qty 1, 1d supply, fill #0

## 2022-10-19 NOTE — Progress Notes (Signed)
   [name] [birth year] [id number]  Patient outreached by [name] , PharmD Candidate on 10/19/22.  Blood Pressure Readings: Last documented ambulatory systolic blood pressure: 140 Last documented ambulatory diastolic blood pressure: 80 Does the patient have a validated home blood pressure machine?: No They report home readings - Catie is sending a script in for a BP machine so he can check his BP at home.   Medication review was performed. Is the patient taking their medications as prescribed?: Yes Differences from their prescribed list include: n/a  The following barriers to adherence were noted: Does the patient have cost concerns?: No Does the patient have transportation concerns?: No Does the patient need assistance obtaining refills?: No Does the patient occassionally forget to take some of their prescribed medications?: No Does the patient have questions or concerns about their medications?: No Does the patient have a follow up scheduled with their primary care provider/cardiologist?: Yes   Interventions: Interventions Completed: Medications were reviewed, Patient was educated on goal blood pressures and long term health implications of elevated blood pressure, Patient was educated on proper technique to check home blood pressure and reminded to bring home machine and readings to next provider appointment  The patient has follow up scheduled:  PCP: Arnette Felts, FNP   [name], Student-PharmD

## 2022-10-20 ENCOUNTER — Encounter: Payer: Self-pay | Admitting: Nurse Practitioner

## 2022-10-20 ENCOUNTER — Ambulatory Visit: Payer: Commercial Managed Care - PPO | Admitting: Nurse Practitioner

## 2022-10-20 VITALS — BP 130/60 | HR 86 | Temp 97.8°F | Ht 66.0 in | Wt 203.8 lb

## 2022-10-20 DIAGNOSIS — E6609 Other obesity due to excess calories: Secondary | ICD-10-CM | POA: Diagnosis not present

## 2022-10-20 DIAGNOSIS — Z89619 Acquired absence of unspecified leg above knee: Secondary | ICD-10-CM

## 2022-10-20 DIAGNOSIS — Z6832 Body mass index (BMI) 32.0-32.9, adult: Secondary | ICD-10-CM | POA: Diagnosis not present

## 2022-10-20 DIAGNOSIS — N182 Chronic kidney disease, stage 2 (mild): Secondary | ICD-10-CM | POA: Diagnosis not present

## 2022-10-20 DIAGNOSIS — I129 Hypertensive chronic kidney disease with stage 1 through stage 4 chronic kidney disease, or unspecified chronic kidney disease: Secondary | ICD-10-CM

## 2022-10-20 DIAGNOSIS — S81801A Unspecified open wound, right lower leg, initial encounter: Secondary | ICD-10-CM

## 2022-10-20 DIAGNOSIS — S81801D Unspecified open wound, right lower leg, subsequent encounter: Secondary | ICD-10-CM

## 2022-10-20 HISTORY — DX: Unspecified open wound, right lower leg, initial encounter: S81.801A

## 2022-10-20 MED ORDER — VALSARTAN-HYDROCHLOROTHIAZIDE 320-25 MG PO TABS
1.0000 | ORAL_TABLET | Freq: Every day | ORAL | 1 refills | Status: DC
Start: 2022-10-20 — End: 2022-12-07

## 2022-10-20 NOTE — Progress Notes (Signed)
Hershal Coria Martin,acting as a Neurosurgeon for Arnette Felts, FNP.,have documented all relevant documentation on the behalf of Arnette Felts, FNP,as directed by  Arnette Felts, FNP while in the presence of Arnette Felts, FNP.    Subjective:     Patient ID: [name] , male    DOB: Sep 21, 1955 , 67 y.o.   MRN: [id number]   Chief Complaint  Patient presents with   Wound Check    HPI  Patient presents today for a wound on his right leg where he has wear and tear of his amputation. Patient report mild pain, trace blood. In February had some irritation at the tip of tibia, had new liners. This helped some and the liners are breaking down again. He went to the prosthetist. Trying to stay off of it, will get a scab but then will open back up. He now has a silicone cap, he has to get the wound healed first. He is no longer applying alcohol but is cleaning with antibacterial soap and water and applying a antibiotic ointment. Patient reports today that the wound is a little better, patient reports he has kept up the same routine. Patient reports pain at the end of the day but no bleeding.   BP Readings from Last 3 Encounters: 10/20/22 : (!) 140/70 10/05/22 : (!) 140/80 11/19/21 : 110/64     Past Medical History:  Diagnosis Date   Acute ischemic left MCA stroke (HCC) 05/02/2019   AKI (acute kidney injury) (HCC) 04/09/2019   Community acquired pneumonia 04/09/2019   Diabetes mellitus without complication (HCC)    GERD (gastroesophageal reflux disease)    Hx of right BKA (HCC)    uses prosthetic    Hyperglycemia 04/09/2019   Hyperlipidemia    Hypertension    Left leg DVT (HCC) 09/22/2019   Liver disorder    tx with ursodiol    Lobar pneumonia (HCC)    Pneumonia due to COVID-19 virus 04/09/2019   Severe sepsis (HCC) 04/09/2019   Transaminitis 04/09/2019     Family History  Problem Relation Age of Onset   Colon polyps Father    Colon cancer Neg Hx    Rectal cancer Neg Hx    Stomach cancer  Neg Hx      Current Outpatient Medications:    acetaminophen (TYLENOL) 500 MG tablet, Take 500 mg by mouth every 6 (six) hours as needed for mild pain or fever. , Disp: , Rfl:    Alcohol Swabs (ALCOHOL WIPES) 70 % PADS, Use to wipe skin to check CBG, Disp: 100 each, Rfl: 0   Ascorbic Acid (VITAMIN C) 1000 MG tablet, Take 1,000 mg by mouth daily., Disp: , Rfl:    aspirin 81 MG chewable tablet, Chew 81 mg by mouth daily., Disp: , Rfl:    atorvastatin (LIPITOR) 40 MG tablet, Take 1 tablet (40 mg total) by mouth daily., Disp: 90 tablet, Rfl: 1   blood glucose meter kit and supplies KIT, Dispense based on patient and insurance preference. Use up to four times daily as directed. (FOR ICD-9 250.00, 250.01)., Disp: 1 each, Rfl: 0   Blood Pressure Monitor DEVI, Use to check blood pressure daily; I10.0 Hypertension, Disp: 1 each, Rfl: 0   COVID-19 mRNA bivalent vaccine, Moderna, (MODERNA COVID-19 BIVAL BOOSTER) 50 MCG/0.5ML injection, Inject into the muscle., Disp: 0.5 mL, Rfl: 0   famotidine (PEPCID) 20 MG tablet, Take 40 mg by mouth as needed. , Disp: , Rfl:    meclizine (ANTIVERT) 12.5 MG  tablet, Take 1 tablet (12.5 mg total) by mouth 3 (three) times daily as needed for dizziness., Disp: 30 tablet, Rfl: 0   metFORMIN (GLUCOPHAGE) 500 MG tablet, Take 1 tablet (500 mg total) by mouth 2 (two) times daily with a meal., Disp: 180 tablet, Rfl: 1   Omega-3 Fatty Acids (FISH OIL PO), Take by mouth daily., Disp: , Rfl:    tamsulosin (FLOMAX) 0.4 MG CAPS capsule, Take 1 capsule (0.4 mg total) by mouth daily after supper., Disp: 90 capsule, Rfl: 0   ursodiol (ACTIGALL) 250 MG tablet, Take 2 tablets (500 mg total) by mouth daily., Disp: 180 tablet, Rfl: 0   VITAMIN D PO, Take 5,000 Units by mouth daily., Disp: , Rfl:    valsartan-hydrochlorothiazide (DIOVAN-HCT) 320-25 MG tablet, Take 1 tablet by mouth daily., Disp: 90 tablet, Rfl: 1   No Known Allergies   Review of Systems  Constitutional: Negative.    Respiratory: Negative.    Cardiovascular: Negative.   Skin:  Positive for wound (right amputation).  Neurological: Negative.   Psychiatric/Behavioral: Negative.       Today's Vitals   10/20/22 1020 10/20/22 1102  BP: (!) 140/70 130/60  Pulse: 86   Temp: 97.8 F (36.6 C)   TempSrc: Oral   Weight: 203 lb 12.8 oz (92.4 kg)   Height: 5\' 6"  (1.676 m)   PainSc: 4    PainLoc: Leg    Body mass index is 32.89 kg/m.  Wt Readings from Last 3 Encounters:  10/20/22 203 lb 12.8 oz (92.4 kg)  10/05/22 203 lb 9.6 oz (92.4 kg)  11/19/21 207 lb (93.9 kg)    The ASCVD Risk score (Arnett DK, et al., 2019) failed to calculate for the following reasons:   The patient has a prior MI or stroke diagnosis ++ Objective:  Physical Exam Vitals reviewed.  Constitutional:      General: He is not in acute distress.    Appearance: Normal appearance. He is obese.  Cardiovascular:     Rate and Rhythm: Normal rate.     Pulses: Normal pulses.     Heart sounds: Normal heart sounds. No murmur heard. Pulmonary:     Effort: Pulmonary effort is normal. No respiratory distress.     Breath sounds: Normal breath sounds. No wheezing.  Musculoskeletal:     Comments: Right AKA with prosthesis  Skin:    General: Skin is warm and dry.     Capillary Refill: Capillary refill takes less than 2 seconds.     Comments: Right amputated stump with scabbed area at tibia and posterior leg. There is a small open area that has white edges and pink center.   Neurological:     General: No focal deficit present.     Mental Status: He is alert and oriented to person, place, and time.     Cranial Nerves: No cranial nerve deficit.     Motor: No weakness.  Psychiatric:        Mood and Affect: Mood normal.        Behavior: Behavior normal.        Thought Content: Thought content normal.        Judgment: Judgment normal.         Assessment And Plan:     1. Wound of right lower extremity, subsequent encounter Comments:  Wound is not much better since last visit, has open area with white edges and pink center, no drainage. Spoke with [facility] recommend referral  2.  History of amputation of lower extremity (HCC)  3. Benign hypertension with CKD (chronic kidney disease), stage II Comments: Blood pressure has been slightly elevated last 2 visits, he is to start taking 1 tab of the Valsartan/HCTZ - valsartan-hydrochlorothiazide (DIOVAN-HCT) 320-25 MG tablet; Take 1 tablet by mouth daily.  Dispense: 90 tablet; Refill: 1  4. Class 1 obesity due to excess calories with body mass index (BMI) of 32.0 to 32.9 in adult, unspecified whether serious comorbidity present She is encouraged to strive for BMI less than 30 to decrease cardiac risk. Advised to aim for at least 150 minutes of exercise per week.   Return if symptoms worsen or fail to improve.   Patient was given opportunity to ask questions. Patient verbalized understanding of the plan and was able to repeat key elements of the plan. All questions were answered to their satisfaction.   Arnette Felts, FNP   I, Arnette Felts, FNP, have reviewed all documentation for this visit. The documentation on 10/20/22 for the exam, diagnosis, procedures, and orders are all accurate and complete.   IF YOU HAVE BEEN REFERRED TO A SPECIALIST, IT MAY TAKE 1-2 WEEKS TO SCHEDULE/PROCESS THE REFERRAL. IF YOU HAVE NOT HEARD FROM US/SPECIALIST IN TWO WEEKS, PLEASE GIVE [place] A CALL AT [phone].   THE PATIENT IS ENCOURAGED TO PRACTICE SOCIAL DISTANCING DUE TO THE COVID-19 PANDEMIC.

## 2022-10-22 ENCOUNTER — Other Ambulatory Visit: Payer: Self-pay

## 2022-10-22 ENCOUNTER — Other Ambulatory Visit (HOSPITAL_COMMUNITY): Payer: Self-pay

## 2022-11-09 ENCOUNTER — Encounter (HOSPITAL_BASED_OUTPATIENT_CLINIC_OR_DEPARTMENT_OTHER): Payer: Commercial Managed Care - PPO | Attending: General Surgery | Admitting: General Surgery

## 2022-11-09 DIAGNOSIS — E11622 Type 2 diabetes mellitus with other skin ulcer: Secondary | ICD-10-CM | POA: Insufficient documentation

## 2022-11-09 DIAGNOSIS — L97812 Non-pressure chronic ulcer of other part of right lower leg with fat layer exposed: Secondary | ICD-10-CM | POA: Diagnosis not present

## 2022-11-09 DIAGNOSIS — Z89511 Acquired absence of right leg below knee: Secondary | ICD-10-CM | POA: Diagnosis not present

## 2022-11-09 DIAGNOSIS — E1151 Type 2 diabetes mellitus with diabetic peripheral angiopathy without gangrene: Secondary | ICD-10-CM | POA: Diagnosis not present

## 2022-11-09 DIAGNOSIS — M1990 Unspecified osteoarthritis, unspecified site: Secondary | ICD-10-CM | POA: Diagnosis not present

## 2022-11-09 DIAGNOSIS — L97819 Non-pressure chronic ulcer of other part of right lower leg with unspecified severity: Secondary | ICD-10-CM | POA: Insufficient documentation

## 2022-11-09 DIAGNOSIS — Z8673 Personal history of transient ischemic attack (TIA), and cerebral infarction without residual deficits: Secondary | ICD-10-CM | POA: Insufficient documentation

## 2022-11-09 DIAGNOSIS — I10 Essential (primary) hypertension: Secondary | ICD-10-CM | POA: Diagnosis not present

## 2022-11-09 NOTE — Progress Notes (Addendum)
CAM, SADUSKY ([id number]) [id number] Page 1 of 9 Visit Report for 11/09/2022 Chief Complaint Document Details Patient Name: Date of Service: [name] [name] BSO [place] Clam. 11/09/2022 9:00 A M Medical Record Number: [id number] Patient Account Number: [id number] Date of Birth/Sex: Treating RN: [birth year] (67 y.o. M) Primary Care Provider: Arnette Felts Other Clinician: Referring Provider: Treating Provider/Extender: Sarita Haver in Treatment: 0 Information Obtained from: Patient Chief Complaint Patients presents for treatment of an open diabetic ulcer Electronic Signature(s) Signed: 11/09/2022 9:59:09 AM By: [name] Previous Signature: 11/09/2022 9:02:44 AM Version By: [name] Entered By: Duanne Guess on 11/09/2022 09:59:09 -------------------------------------------------------------------------------- Debridement Details Patient Name: Date of Service: [name] [name], [name] BSO N M. 11/09/2022 9:00 A M Medical Record Number: [id number] Patient Account Number: [id number] Date of Birth/Sex: Treating RN: January 06, 1956 (67 y.o. [name] [name] Primary Care Provider: Arnette Felts Other Clinician: Referring Provider: Treating Provider/Extender: Duane Lope Weeks in Treatment: 0 Debridement Performed for Assessment: Wound #1 Right Amputation Site - Below Knee Performed By: Physician Duanne Guess, MD Debridement Type: Debridement Severity of Tissue Pre Debridement: Fat layer exposed Level of Consciousness (Pre-procedure): Awake and Alert Pre-procedure Verification/Time Out Yes - 09:45 Taken: Start Time: 09:48 Pain Control: Lidocaine 4% Topical Solution Percent of Wound Bed Debrided: 100% T Area Debrided (cm): otal 0.01 Tissue and other material debrided: Non-Viable, Eschar Level: Non-Viable Tissue Debridement Description: Selective/Open Wound Instrument: Curette Bleeding: None Procedural  Pain: 0 Post Procedural Pain: 0 Response to Treatment: Procedure was tolerated well Level of Consciousness (Post- Awake and Alert procedure): Post Debridement Measurements of Total Wound Length: (cm) 0.1 Width: (cm) 0.1 Depth: (cm) 0.1 Volume: (cm) 0.001 Character of Wound/Ulcer Post Debridement: Improved Severity of Tissue Post Debridement: Fat layer exposed [name] [name] ([id number]) [id number] Page 2 of 9 Post Procedure Diagnosis Same as Pre-procedure Notes Scribed for Dr. Lady Gary by Zenaida Deed, RN Electronic Signature(s) Signed: 11/09/2022 11:49:02 AM By: [name] Signed: 11/09/2022 4:43:15 PM By: Zenaida Deed RN, BSN Entered By: Zenaida Deed on 11/09/2022 09:53:29 -------------------------------------------------------------------------------- HPI Details Patient Name: Date of Service: [name] [name], [name] BSO N M. 11/09/2022 9:00 A M Medical Record Number: [id number] Patient Account Number: [id number] Date of Birth/Sex: Treating RN: 1955/11/18 (67 y.o. M) Primary Care Provider: Arnette Felts Other Clinician: Referring Provider: Treating Provider/Extender: Duane Lope Weeks in Treatment: 0 History of Present Illness HPI Description: ADMISSION 11/09/2022 This is a 67 year old type II diabetic (last hemoglobin A1c 5.9%) with a history of right below-knee amputation. He has been having difficulty with the fit of his prosthesis and developed a wound on the end of his amputation stump in February. He has apparently gotten new liners and has been to the prosthetist. He got a silicone cap, but can't really use it until his wound is healed. He was apparently applying alcohol to the wound at one point but was instructed not to do so by his primary care provider. Due to lack of healing progress, he was referred to the wound care center for further evaluation and management. Electronic Signature(s) Signed: 11/09/2022  9:59:27 AM By: [name] Previous Signature: 11/09/2022 9:06:13 AM Version By: [name] Entered By: Duanne Guess on 11/09/2022 09:59:27 -------------------------------------------------------------------------------- Physical Exam Details Patient Name: Date of Service: [name] [name], [name] BSO N M. 11/09/2022 9:00 A M Medical Record Number: [id number] Patient Account Number: [id number] Date of Birth/Sex: Treating RN: 01-23-1956 (67 y.o. M) Primary Care Provider: Arnette Felts  Other Clinician: Referring Provider: Treating Provider/Extender: Duane Lope Weeks in Treatment: 0 Constitutional . . . . No acute distress. Respiratory Normal work of breathing on room air. Notes 11/09/2022: On the lateral aspect of his amputation site, there is a callused area, underneath which the wound is healed. On the distal tibial portion of his amputation site, the wound is almost healed here, as well. There is a pinpoint opening. No concern for infection. Electronic Signature(s) Signed: 11/09/2022 10:00:28 AM By: [name] Entered By: Duanne Guess on 11/09/2022 10:00:28 [name] [name] ([id number]) [id number] Page 3 of 9 -------------------------------------------------------------------------------- Physician Orders Details Patient Name: Date of Service: [name] [name], [name] BSO N M. 11/09/2022 9:00 A M Medical Record Number: [id number] Patient Account Number: [id number] Date of Birth/Sex: Treating RN: 1955/06/15 (67 y.o. [name] [name] Primary Care Provider: Arnette Felts Other Clinician: Referring Provider: Treating Provider/Extender: Duane Lope Weeks in Treatment: 0 Verbal / Phone Orders: No Diagnosis Coding ICD-10 Coding Code Description 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 with unspecified severity Z89.511 Acquired absence of right leg below knee E11.622 Type  2 diabetes mellitus with other skin ulcer Follow-up Appointments ppointment in 1 week. - Dr. Lady Gary RM 2 Return A Tuesday 6/25 @ 08:30 am Anesthetic Wound #1 Right Amputation Site - Below Knee (In clinic) Topical Lidocaine 4% applied to wound bed Bathing/ Shower/ Hygiene May shower and wash wound with soap and water. Wound Treatment Wound #1 - Amputation Site - Below Knee Wound Laterality: Right Topical: Triple Antibiotic Ointment, 1 (oz) Tube 1 x Per Day/30 Days Secondary Dressing: Zetuvit Plus Silicone Border Dressing 4x4 (in/in) (DME) (Dispense As Written) 1 x Per Day/30 Days Discharge Instructions: Apply silicone border over primary dressing as directed. Patient Medications llergies: No Known Allergies A Notifications Medication Indication Start End prior to debridement 11/09/2022 lidocaine DOSE topical 4 % cream - cream topical Electronic Signature(s) Signed: 11/09/2022 10:00:37 AM By: [name] Entered By: Duanne Guess on 11/09/2022 10:00:36 -------------------------------------------------------------------------------- Problem List Details Patient Name: Date of Service: [name] [name], [name] BSO N M. 11/09/2022 9:00 A M Medical Record Number: [id number] Patient Account Number: [id number] Date of Birth/Sex: Treating RN: September 08, 1955 (67 y.o. 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 11/09/2022 9:00 A M Medical Record Number: [id number] Patient Account Number: [id number] Date of Birth/Sex: Treating RN: Jan 17, 1956 (67 y.o. M) Primary Care Provider: Arnette Felts Other Clinician: Referring Provider: Treating Provider/Extender: Duane Lope Weeks in Treatment: 0 Subjective Chief Complaint Information obtained from Patient Patients presents for treatment of an open diabetic ulcer History of Present Illness (HPI) ADMISSION 11/09/2022 This is a 67 year old type II diabetic (last hemoglobin A1c 5.9%) with a history of right below-knee amputation. He has been having difficulty with the fit of his prosthesis and developed a wound on the end of  his amputation stump in February. He has apparently gotten new liners and has been to the prosthetist. He got a silicone cap, but can't really use it until his wound is healed. He was apparently applying alcohol to the wound at one point but was instructed not to do so by his primary care provider. Due to lack of healing progress, he was referred to the wound care center for further evaluation and management. Patient History Information obtained from Patient, Chart. Allergies No Known Allergies Family History Diabetes - Paternal Grandparents, Hypertension - Father,Paternal Grandparents,Maternal Grandparents, No family history of Cancer, Heart Disease, Hereditary Spherocytosis, Kidney Disease, Lung Disease, Seizures, Stroke, Thyroid Problems, Tuberculosis. Social History Never smoker, Marital Status - Married, Alcohol Use - Never, Drug Use - No History, Caffeine Use - Daily. Medical History Eyes Patient has history of Cataracts -  early KEDUS, DROUHARD ([id number]) [id number] Page 5 of 9 Cardiovascular Patient has history of Hypertension Endocrine Patient has history of Type II Diabetes Denies history of Type I Diabetes Genitourinary Denies history of End Stage Renal Disease Immunological Denies history of Lupus Erythematosus, Raynauds, Scleroderma Integumentary (Skin) Denies history of History of Burn Musculoskeletal Patient has history of Osteoarthritis Oncologic Denies history of Received Chemotherapy, Received Radiation Psychiatric Denies history of Anorexia/bulimia, Confinement Anxiety Patient is treated with Oral Agents. Blood sugar is not tested. Hospitalization/Surgery History - right BKA. - cholecystectomy. Medical A Surgical History Notes nd Respiratory hx PEs and long COVID Gastrointestinal hx pancreatitis Neurologic left MCA CVA d/t COVID Review of Systems (ROS) Constitutional Symptoms (General Health) Denies complaints or symptoms of Fatigue, Fever, Chills, Marked Weight Change. Eyes Complains or has symptoms of Glasses / Contacts. Denies complaints or symptoms of Dry Eyes, Vision Changes. Ear/Nose/Mouth/Throat Denies complaints or symptoms of Chronic sinus problems or rhinitis. Respiratory Denies complaints or symptoms of Chronic or frequent coughs, Shortness of Breath. Cardiovascular Denies complaints or symptoms of Chest pain. Gastrointestinal Denies complaints or symptoms of Frequent diarrhea, Nausea, Vomiting. Endocrine Denies complaints or symptoms of Heat/cold intolerance. Genitourinary Denies complaints or symptoms of Frequent urination. Integumentary (Skin) Complains or has symptoms of Wounds - right BKA stump. Musculoskeletal Denies complaints or symptoms of Muscle Pain, Muscle Weakness. Neurologic Denies complaints or symptoms of Numbness/parasthesias. Psychiatric Denies complaints or symptoms of  Claustrophobia. Objective Constitutional No acute distress. Vitals Time Taken: 9:19 AM, Height: 66 in, Source: Stated, Weight: 203 lbs, BMI: 32.8, Temperature: 98. F, Pulse: 63 bpm, Respiratory Rate: 18 breaths/min, Blood Pressure: 119/76 mmHg, Capillary Blood Glucose: 118 mg/dl. General Notes: glucose per pt report yesterday Respiratory Normal work of breathing on room air. General Notes: 11/09/2022: On the lateral aspect of his amputation site, there is a callused area, underneath which the wound is healed. On the distal tibial portion of his amputation site, the wound is almost healed here, as well. There is a pinpoint opening. No concern for infection. Integumentary (Hair, Skin) Wound #1 status is Open. Original cause of wound was Pressure Injury. The date acquired was: 08/06/2022. The wound is located on the Right Amputation Site - Below Knee. The wound measures 0.3cm length x 0.4cm width x 0.1cm depth; 0.094cm^2 area and 0.009cm^3 volume. There is Fat Layer (Subcutaneous Tissue) exposed. There is no tunneling or undermining noted. There is a small amount of serous drainage noted. The wound margin is flat and intact. There is no granulation within the wound bed. There is a large (67-100%) amount of necrotic tissue within the wound bed including Eschar. The periwound skin appearance had no  abnormalities noted for moisture. The periwound skin appearance had no abnormalities noted for color. The periwound skin appearance exhibited: Scarring. The periwound skin appearance did not exhibit: Callus. Periwound temperature was noted as No Abnormality. The periwound has tenderness on palpation. 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 There was a Selective/Open Wound Non-Viable Tissue Debridement with a total area of 0.01 sq cm performed by Duanne Guess, MD. With the following instrument(s): Curette to remove Non-Viable tissue/material. Material removed includes Eschar after achieving pain control using Lidocaine 4% Topical Solution. No specimens were taken. A time out was conducted at 09:45, prior to the start of the procedure. There was no bleeding. The procedure was tolerated well with a pain level of 0 throughout and a pain level of 0 following the procedure. Post Debridement Measurements: 0.1cm length x 0.1cm width x 0.1cm depth; 0.001cm^3 volume. Character of Wound/Ulcer Post Debridement is improved. Severity of Tissue Post Debridement is: Fat layer exposed. Post procedure Diagnosis Wound #1: Same as Pre-Procedure General Notes: Scribed for Dr. Lady Gary by Zenaida Deed, RN. Plan Follow-up Appointments: Return Appointment in 1 week. - Dr. Lady Gary RM 2 Tuesday 6/25 @ 08:30 am Anesthetic: Wound #1 Right Amputation Site - Below Knee: (In clinic) Topical Lidocaine 4% applied to wound bed Bathing/ Shower/ Hygiene: May shower and wash wound with soap and water. The following medication(s) was prescribed: lidocaine topical 4 % cream cream topical for prior to debridement was prescribed at facility WOUND #1: - Amputation Site - Below Knee Wound Laterality: Right Topical: Triple Antibiotic Ointment, 1 (oz) Tube 1 x Per Day/30 Days Secondary Dressing: Zetuvit Plus Silicone Border Dressing 4x4 (in/in) (DME) (Dispense As Written) 1 x Per Day/30 Days Discharge Instructions: Apply silicone border over primary dressing as directed. 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 On the lateral aspect of his amputation site, there is a callused area, underneath which the wound is healed. On the distal tibial portion of his amputation site, the wound is almost healed here, as well. There is a pinpoint opening. No concern for infection. I used a curette to debride the eschar from the tibial wound. It is very nearly healed, despite the fact that he continues to wear his prosthesis and work every day. I think he can continue to just apply the triple antibiotic ointment but I am going to ask him to wear a little bit thicker padding between his limb and his prosthetic. We will use a foam border dressing. I anticipate he will be completely healed at his visit next week. Electronic Signature(s) Signed: 11/09/2022 10:02:15 AM By: [name] Entered By: Duanne Guess on 11/09/2022 10:02:14 -------------------------------------------------------------------------------- HxROS Details Patient Name: Date of Service: [name] [name], [name] BSO N M. 11/09/2022 9:00 A M Medical Record Number: [id number] Patient Account Number: [id number] Date of Birth/Sex: Treating RN: [birth year] (67 y.o. [name] [name] Primary Care Provider: Arnette Felts Other Clinician: Referring Provider: Treating Provider/Extender: Sarita Haver in Treatment: 0 [name] [name], [name] [name] ([id number]) [id number] Page 7 of 9 Information Obtained  From Patient Chart Constitutional Symptoms (General Health) Complaints and Symptoms: Negative for: Fatigue; Fever; Chills; Marked Weight Change Eyes Complaints and Symptoms: Positive for: Glasses / Contacts Negative for: Dry Eyes; Vision Changes Medical History: Positive for: Cataracts - early Ear/Nose/Mouth/Throat Complaints and Symptoms: Negative for: Chronic sinus problems or rhinitis Respiratory Complaints and Symptoms: Negative for: Chronic or frequent coughs; Shortness of  Breath Medical History: Past Medical History Notes: hx PEs and long COVID Cardiovascular Complaints and Symptoms: Negative for: Chest pain Medical History: Positive for: Hypertension Gastrointestinal Complaints and Symptoms: Negative for: Frequent diarrhea; Nausea; Vomiting Medical History: Past Medical History Notes: hx pancreatitis Endocrine Complaints and Symptoms: Negative for: Heat/cold intolerance Medical History: Positive for: Type II Diabetes Negative for: Type I Diabetes Time with diabetes: 3 yrs Treated with: Oral agents Blood sugar tested every day: No Genitourinary Complaints and Symptoms: Negative for: Frequent urination Medical History: Negative for: End Stage Renal Disease Integumentary (Skin) Complaints and Symptoms: Positive for: Wounds - right BKA stump Medical History: Negative for: History of Burn Musculoskeletal Complaints and Symptoms: Negative for: Muscle Pain; Muscle Weakness [name] [name], [name] [name] ([id number]) [id number] Page 8 of 9 Medical History: Positive for: Osteoarthritis Neurologic Complaints and Symptoms: Negative for: Numbness/parasthesias Medical History: Past Medical History Notes: left MCA CVA d/t COVID Psychiatric Complaints and Symptoms: Negative for: Claustrophobia Medical History: Negative for: Anorexia/bulimia; Confinement Anxiety Hematologic/Lymphatic Immunological Medical History: Negative for: Lupus Erythematosus; Raynauds; Scleroderma Oncologic Medical History: Negative for: Received Chemotherapy; Received Radiation HBO Extended History Items Eyes: Cataracts Immunizations Pneumococcal Vaccine: Received Pneumococcal Vaccination: Yes Received Pneumococcal Vaccination On or After 60th Birthday: Yes Implantable Devices None Hospitalization / Surgery History Type of Hospitalization/Surgery right BKA cholecystectomy Family and Social History Cancer: No; Diabetes: Yes - Paternal  Grandparents; Heart Disease: No; Hereditary Spherocytosis: No; Hypertension: Yes - Father,Paternal Grandparents,Maternal Grandparents; Kidney Disease: No; Lung Disease: No; Seizures: No; Stroke: No; Thyroid Problems: No; Tuberculosis: No; Never smoker; Marital Status - Married; Alcohol Use: Never; Drug Use: No History; Caffeine Use: Daily; Financial Concerns: No; Food, Clothing or Shelter Needs: No; Support System Lacking: No; Transportation Concerns: No Electronic Signature(s) Signed: 11/09/2022 11:49:02 AM By: [name] Signed: 11/09/2022 4:43:15 PM By: Zenaida Deed RN, BSN Entered By: Zenaida Deed on 11/09/2022 09:29:48 -------------------------------------------------------------------------------- SuperBill Details Patient Name: Date of Service: [name] [name], [name] BSO N M. 11/09/2022 Medical Record Number: [id number] Patient Account Number: [id number] Date of Birth/Sex: Treating RN: 09/21/1955 (67 y.o. M) Primary Care Provider: Arnette Felts Other Clinician: Referring Provider: Treating Provider/Extender: [name] [name], [name] [name] ([id number]) [id number].pdf Page 9 of 9 Weeks in Treatment: 0 Diagnosis Coding ICD-10 Codes Code Description 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 with unspecified severity Z89.511 Acquired absence of right leg below knee E11.622 Type 2 diabetes mellitus with other skin ulcer Facility Procedures : CPT4 Code: [id number] Description: 99213 - WOUND CARE VISIT-LEV 3 EST PT Modifier: 25 Quantity: 1 : CPT4 Code: [id number] Description: 97597 - DEBRIDE WOUND 1ST 20 SQ CM OR < ICD-10 Diagnosis Description 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 with unspecified seve Modifier: rity Quantity: 1 Physician Procedures : CPT4 Code Description Modifier [id number] - WC PHYS LEVEL 4 - NEW PT 25 ICD-10 Diagnosis Description L97.819 Non-pressure chronic  ulcer of other part of right lower leg with unspecified severity Z89.511 Acquired absence of right leg below knee  E11.622 Type 2 diabetes mellitus with other skin ulcer Quantity: 1 : [id number] 97597 - WC PHYS DEBR  WO ANESTH 20 SQ CM ICD-10 Diagnosis Description 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 with unspecified severity Quantity: 1 Electronic Signature(s) Signed: 11/09/2022 11:49:02 AM By: [name] Signed: 11/09/2022 4:43:15 PM By: Zenaida Deed RN, BSN Previous Signature: 11/09/2022 10:02:30 AM Version By: [name] Entered By: Zenaida Deed on 11/09/2022 10:07:01

## 2022-11-09 NOTE — Progress Notes (Signed)
[name], [name] ([id number]) [phone] [id number] Page 1 of 4 Visit Report for 11/09/2022 Abuse Risk Screen Details Patient Name: Date of Service: [name], [place] BSO N [name]. 11/09/2022 9:00 A [name] Medical Record Number: [id number] Patient Account Number: [id number] Date of Birth/Sex: Treating RN: [birth year] (67 y.o. [name] Primary Care [name]: [name] Other Clinician: Referring [name]: Treating [name]/Extender: [name] in Treatment: 0 Abuse Risk Screen Items Answer ABUSE RISK SCREEN: Has anyone close to you tried to hurt or harm you recentlyo No Do you feel uncomfortable with anyone in your familyo No Has anyone forced you do things that you didnt want to doo No Electronic Signature(s) Signed: 11/09/2022 4:43:15 PM By: Zenaida Deed RN, BSN Entered By: Zenaida Deed on 11/09/2022 09:29:56 -------------------------------------------------------------------------------- Activities of Daily Living Details Patient Name: Date of Service: [name], [name]. 11/09/2022 9:00 A [name] Medical Record Number: [id number] Patient Account Number: [id number] Date of Birth/Sex: Treating RN: November 25, 1955 (67 y.o. [name] Primary Care [name]: [name] Other Clinician: Referring [name]: Treating [name]/Extender: [name] in Treatment: 0 Activities of Daily Living Items Answer Activities of Daily Living (Please select one for each item) Drive Automobile Completely Able T Medications ake Completely Able Use T elephone Completely Able Care for Appearance Completely Able Use T oilet Completely Able Bath / Shower Completely Able Dress Self Completely Able Feed Self Completely Able Walk Completely Able Get In / Out Bed Completely Able Housework Completely Able Prepare Meals Completely Able Handle Money Completely Able Shop for Self Completely Able Electronic Signature(s) Signed:  11/09/2022 4:43:15 PM By: Zenaida Deed RN, BSN Entered By: Zenaida Deed on 11/09/2022 09:30:26 [name] ([id number]) [phone] [id number] Page 2 of 4 -------------------------------------------------------------------------------- Education Screening Details Patient Name: Date of Service: [name], [place] BSO N [name]. 11/09/2022 9:00 A [name] Medical Record Number: [id number] Patient Account Number: [id number] Date of Birth/Sex: Treating RN: 1956/05/23 (67 y.o. [name] Primary Care [name]: [name] Other Clinician: Referring [name]: Treating [name]/Extender: [name] in Treatment: 0 Primary Learner Assessed: Patient Learning Preferences/Education Level/Primary Language Learning Preference: Explanation, Demonstration, Printed Material Highest Education Level: College or Above Preferred Language: English Cognitive Barrier Language Barrier: No Translator Needed: No Memory Deficit: No Emotional Barrier: No Cultural/Religious Beliefs Affecting Medical Care: No Physical Barrier Impaired Vision: Yes Glasses Impaired Hearing: No Decreased Hand dexterity: No Knowledge/Comprehension Knowledge Level: High Comprehension Level: High Ability to understand written instructions: High Ability to understand verbal instructions: High Motivation Anxiety Level: Calm Cooperation: Cooperative Education Importance: Acknowledges Need Interest in Health Problems: Asks Questions Perception: Coherent Willingness to Engage in Self-Management High Activities: Readiness to Engage in Self-Management High Activities: Electronic Signature(s) Signed: 11/09/2022 4:43:15 PM By: Zenaida Deed RN, BSN Entered By: Zenaida Deed on 11/09/2022 09:30:51 -------------------------------------------------------------------------------- Fall Risk Assessment Details Patient Name: Date of Service: [name], [name]. 11/09/2022 9:00 A  [name] Medical Record Number: [id number] Patient Account Number: [id number] Date of Birth/Sex: Treating RN: [birth year] (66 y.o. [name] Primary Care [name]: [name] Other Clinician: Referring [name]: Treating [name]/Extender: [name] in Treatment: 0 Fall Risk Assessment Items Have you had 2 or more falls in the last [address] monthso 0 No [name], [name] ([id number]) [phone] [id number] Page 3 of 4 Have you had any fall that resulted in injury in the last 12 monthso 0 No FALLS RISK SCREEN History of falling - immediate or within 3 months 25 Yes Secondary  diagnosis (Do you have 2 or more medical diagnoseso) 0 No Ambulatory aid None/bed rest/wheelchair/nurse 0 Yes Crutches/cane/walker 0 No Furniture 0 No Intravenous therapy Access/Saline/Heparin Lock 0 No Gait/Transferring Normal/ bed rest/ wheelchair 0 Yes Weak (short steps with or without shuffle, stooped but able to lift head while walking, may seek 0 No support from furniture) Impaired (short steps with shuffle, may have difficulty arising from chair, head down, impaired 0 No balance) Mental Status Oriented to own ability 0 Yes Electronic Signature(s) Signed: 11/09/2022 4:43:15 PM By: Zenaida Deed RN, BSN Entered By: Zenaida Deed on 11/09/2022 09:31:05 -------------------------------------------------------------------------------- Foot Assessment Details Patient Name: Date of Service: [name], [name]. 11/09/2022 9:00 A [name] Medical Record Number: [id number] Patient Account Number: [id number] Date of Birth/Sex: Treating RN: 04/04/1956 (67 y.o. [name] Primary Care [name]: [name] Other Clinician: Referring [name]: Treating [name]/Extender: [name] in Treatment: 0 Foot Assessment Items [x]  Unable to perform right foot assessment due to amputation Site Locations + = Sensation present, - = Sensation  absent, C = Callus, U = Ulcer R = Redness, W = Warmth, [name] = Maceration, PU = Pre-ulcerative lesion F = Fissure, S = Swelling, D = Dryness Assessment Right: Left: Other Deformity: No Prior Foot Ulcer: No Prior Amputation: No Charcot Joint: No Ambulatory Status: Ambulatory Without Help [name], [name] ([id number]) [phone] [id number] Page 4 of 4 Gait: Steady Electronic Signature(s) Signed: 11/09/2022 4:43:15 PM By: Zenaida Deed RN, BSN Entered By: Zenaida Deed on 11/09/2022 09:31:43 -------------------------------------------------------------------------------- Nutrition Risk Screening Details Patient Name: Date of Service: [name], [name]. 11/09/2022 9:00 A [name] Medical Record Number: [id number] Patient Account Number: [id number] Date of Birth/Sex: Treating RN: [birth year] (67 y.o. [name] Primary Care Rudy Domek: [name] Other Clinician: Referring Kim Lauver: Treating Torianna Junio/Extender: [name] in Treatment: 0 Height (in): 66 Weight (lbs): 203 Body Mass Index (BMI): 32.8 Nutrition Risk Screening Items Score Screening NUTRITION RISK SCREEN: I have an illness or condition that made me change the kind and/or amount of food I eat 0 No I eat fewer than two meals per day 0 No I eat few fruits and vegetables, or milk products 0 No I have three or more drinks of beer, liquor or wine almost every day 0 No I have tooth or mouth problems that make it hard for me to eat 0 No I don't always have enough money to buy the food I need 0 No I eat alone most of the time 0 No I take three or more different prescribed or over-the-counter drugs a day 1 Yes Without wanting to, I have lost or gained 10 pounds in the last six months 0 No I am not always physically able to shop, cook and/or feed myself 0 No Nutrition Protocols Good Risk Protocol 0 No interventions needed Moderate Risk Protocol High Risk Proctocol Risk Level:  Good Risk Score: 1 Electronic Signature(s) Signed: 11/09/2022 4:43:15 PM By: Zenaida Deed RN, BSN Entered By: Zenaida Deed on 11/09/2022 09:31:30

## 2022-11-09 NOTE — Progress Notes (Signed)
KANEN, HORITA ([id number]) [id number] Page 1 of 8 Visit Report for 11/09/2022 Allergy List Details Patient Name: Date of Service: Liborio Negrin Torres, [place] BSO N M. 11/09/2022 9:00 A M Medical Record Number: [id number] Patient Account Number: [id number] Date of Birth/Sex: Treating RN: Jul 10, 1955 (67 y.o. Damaris Schooner Primary Care Bebe Moncure: Arnette Felts Other Clinician: Referring Jerric Oyen: Treating Tima Curet/Extender: Duane Lope Weeks in Treatment: 0 Allergies Active Allergies No Known Allergies Allergy Notes Electronic Signature(s) Signed: 11/09/2022 4:43:15 PM By: Zenaida Deed RN, BSN Entered By: Zenaida Deed on 11/09/2022 09:21:53 -------------------------------------------------------------------------------- Arrival Information Details Patient Name: Date of Service: [name], [name]. 11/09/2022 9:00 A M Medical Record Number: [id number] Patient Account Number: [id number] Date of Birth/Sex: Treating RN: [birth year] (67 y.o. Damaris Schooner Primary Care Carry Ortez: Arnette Felts Other Clinician: Referring Nicky Kras: Treating Alejo Beamer/Extender: Sarita Haver in Treatment: 0 Visit Information Patient Arrived: Ambulatory Arrival Time: 09:14 Accompanied By: self Transfer Assistance: None Patient Identification Verified: Yes Secondary Verification Process Completed: Yes Patient Requires Transmission-Based Precautions: No Patient Has Alerts: No Electronic Signature(s) Signed: 11/09/2022 4:43:15 PM By: Zenaida Deed RN, BSN Entered By: Zenaida Deed on 11/09/2022 09:14:42 -------------------------------------------------------------------------------- Clinic Level of Care Assessment Details Patient Name: Date of Service: BRYA NT, [name]. 11/09/2022 9:00 A M Medical Record Number: [id number] Patient Account Number: [id number] ADDAM, FRIEDLANDER ([id number]) [phone].pdf Page 2 of  8 Date of Birth/Sex: Treating RN: Jun 10, 1955 (67 y.o. Damaris Schooner Primary Care Jonella Redditt: Other Clinician: Arnette Felts Referring Jenel Gierke: Treating Shelli Portilla/Extender: Sarita Haver in Treatment: 0 Clinic Level of Care Assessment Items TOOL 1 Quantity Score []  - 0 Use when EandM and Procedure is performed on INITIAL visit ASSESSMENTS - Nursing Assessment / Reassessment X- 1 20 General Physical Exam (combine w/ comprehensive assessment (listed just below) when performed on new pt. evals) X- 1 25 Comprehensive Assessment (HX, ROS, Risk Assessments, Wounds Hx, etc.) ASSESSMENTS - Wound and Skin Assessment / Reassessment []  - 0 Dermatologic / Skin Assessment (not related to wound area) ASSESSMENTS - Ostomy and/or Continence Assessment and Care []  - 0 Incontinence Assessment and Management []  - 0 Ostomy Care Assessment and Management (repouching, etc.) PROCESS - Coordination of Care X - Simple Patient / Family Education for ongoing care 1 15 []  - 0 Complex (extensive) Patient / Family Education for ongoing care X- 1 10 Staff obtains Chiropractor, Records, T Results / Process Orders est []  - 0 Staff telephones HHA, Nursing Homes / Clarify orders / etc []  - 0 Routine Transfer to another Facility (non-emergent condition) []  - 0 Routine Hospital Admission (non-emergent condition) X- 1 15 New Admissions / Manufacturing engineer / Ordering NPWT Apligraf, etc. , []  - 0 Emergency Hospital Admission (emergent condition) PROCESS - Special Needs []  - 0 Pediatric / Minor Patient Management []  - 0 Isolation Patient Management []  - 0 Hearing / Language / Visual special needs []  - 0 Assessment of Community assistance (transportation, D/C planning, etc.) []  - 0 Additional assistance / Altered mentation []  - 0 Support Surface(s) Assessment (bed, cushion, seat, etc.) INTERVENTIONS - Miscellaneous []  - 0 External ear exam []  - 0 Patient Transfer  (multiple staff / Nurse, adult / Similar devices) []  - 0 Simple Staple / Suture removal (25 or less) []  - 0 Complex Staple / Suture removal (26 or more) []  - 0 Hypo/Hyperglycemic Management (do not check if billed separately) []  - 0 Ankle / Brachial Index (ABI) - do not check if billed separately  Has the patient been seen at the hospital within the last three years: Yes Total Score: 85 Level Of Care: New/Established - Level 3 Electronic Signature(s) Signed: 11/09/2022 4:43:15 PM By: Zenaida Deed RN, BSN Entered By: Zenaida Deed on 11/09/2022 10:06:51 Kathie Dike ([id number]) [id number].pdf Page 3 of 8 -------------------------------------------------------------------------------- Encounter Discharge Information Details Patient Name: Date of Service: [place], [place] BSO N M. 11/09/2022 9:00 A M Medical Record Number: [id number] Patient Account Number: [id number] Date of Birth/Sex: Treating RN: 09-21-1955 (67 y.o. Damaris Schooner Primary Care Tanita Palinkas: Arnette Felts Other Clinician: Referring Genese Quebedeaux: Treating Sarabella Caprio/Extender: Sarita Haver in Treatment: 0 Encounter Discharge Information Items Post Procedure Vitals Discharge Condition: Stable Temperature (F): 98.1 Ambulatory Status: Ambulatory Pulse (bpm): 63 Discharge Destination: Home Respiratory Rate (breaths/min): 18 Transportation: Private Auto Blood Pressure (mmHg): 119/76 Accompanied By: self Schedule Follow-up Appointment: Yes Clinical Summary of Care: Patient Declined Electronic Signature(s) Signed: 11/09/2022 4:43:15 PM By: Zenaida Deed RN, BSN Entered By: Zenaida Deed on 11/09/2022 10:07:52 -------------------------------------------------------------------------------- Lower Extremity Assessment Details Patient Name: Date of Service: BRYA NT, [name]. 11/09/2022 9:00 A M Medical Record Number: [id number] Patient Account Number: [id number] Date of  Birth/Sex: Treating RN: [birth year] (67 y.o. Damaris Schooner Primary Care Marche Hottenstein: Arnette Felts Other Clinician: Referring Deadra Diggins: Treating Quandra Fedorchak/Extender: Duane Lope Weeks in Treatment: 0 Electronic Signature(s) Signed: 11/09/2022 4:43:15 PM By: Zenaida Deed RN, BSN Entered By: Zenaida Deed on 11/09/2022 09:31:52 -------------------------------------------------------------------------------- Multi Wound Chart Details Patient Name: Date of Service: [name], [name]. 11/09/2022 9:00 A M Medical Record Number: [id number] Patient Account Number: [id number] Date of Birth/Sex: Treating RN: 07-03-1955 (67 y.o. M) Primary Care Nohealani Medinger: Arnette Felts Other Clinician: Referring Frisco Cordts: Treating Damesha Lawler/Extender: Duane Lope Weeks in Treatment: 0 Vital Signs Height(in): 66 Capillary Blood Glucose(mg/dl): 433 Weight(lbs): 295 Pulse(bpm): 63 Body Mass Index(BMI): 32.8 Blood Pressure(mmHg): 119/76 Temperature(F): 98. Respiratory Rate(breaths/min): 18 [1:Photos:] [N/A:N/A] Right Amputation Site - Below Knee N/A N/A Wound Location: Pressure Injury N/A N/A Wounding Event: Diabetic Wound/Ulcer of the Lower N/A N/A Primary Etiology: Extremity Cataracts, Hypertension, Type II N/A N/A Comorbid History: Diabetes, Osteoarthritis 08/06/2022 N/A N/A Date Acquired: 0 N/A N/A Weeks of Treatment: Open N/A N/A Wound Status: No N/A N/A Wound Recurrence: 0.3x0.4x0.1 N/A N/A Measurements L x W x D (cm) 0.094 N/A N/A A (cm) : rea 0.009 N/A N/A Volume (cm) : Grade 1 N/A N/A Classification: Small N/A N/A Exudate A mount: Serous N/A N/A Exudate Type: amber N/A N/A Exudate Color: Flat and Intact N/A N/A Wound Margin: None Present (0%) N/A N/A Granulation A mount: Large (67-100%) N/A N/A Necrotic A mount: Eschar N/A N/A Necrotic Tissue: Fat Layer (Subcutaneous Tissue): Yes N/A N/A Exposed Structures: Fascia: No Tendon:  No Muscle: No Joint: No Bone: No Large (67-100%) N/A N/A Epithelialization: Debridement - Selective/Open Wound N/A N/A Debridement: Pre-procedure Verification/Time Out 09:45 N/A N/A Taken: Lidocaine 4% Topical Solution N/A N/A Pain Control: Necrotic/Eschar N/A N/A Tissue Debrided: Non-Viable Tissue N/A N/A Level: 0.01 N/A N/A Debridement A (sq cm): rea Curette N/A N/A Instrument: None N/A N/A Bleeding: 0 N/A N/A Procedural Pain: 0 N/A N/A Post Procedural Pain: Procedure was tolerated well N/A N/A Debridement Treatment Response: 0.1x0.1x0.1 N/A N/A Post Debridement Measurements L x W x D (cm) 0.001 N/A N/A Post Debridement Volume: (cm) Scarring: Yes N/A N/A Periwound Skin Texture: Callus: No No Abnormalities Noted N/A N/A Periwound Skin Moisture: No Abnormalities Noted N/A N/A Periwound Skin Color: No Abnormality N/A  N/A Temperature: Yes N/A N/A Tenderness on Palpation: Debridement N/A N/A Procedures Performed: Treatment Notes Electronic Signature(s) Signed: 11/09/2022 9:58:25 AM By: [name] Entered By: Duanne Guess on 11/09/2022 09:58:25 -------------------------------------------------------------------------------- Multi-Disciplinary Care Plan Details Patient Name: Date of Service: [name], [name]. 11/09/2022 9:00 A M Medical Record Number: [id number] Patient Account Number: [id number] Date of Birth/Sex: Treating RN: 1955/11/06 (67 y.o. Damaris Schooner Primary Care Gara Kincade: Arnette Felts Other Clinician: Referring Sunil Hue: Treating Arliene Rosenow/Extender: Sarita Haver in Treatment: 0 ZAEVION, KENOYER Union ([id number]) [id number] Page 5 of 8 Active Inactive Nutrition Nursing Diagnoses: Impaired glucose control: actual or potential Potential for alteratiion in Nutrition/Potential for imbalanced nutrition Goals: Patient/caregiver will maintain therapeutic glucose control Date  Initiated: 11/09/2022 Target Resolution Date: 12/07/2022 Goal Status: Active Interventions: Assess HgA1c results as ordered upon admission and as needed Provide education on elevated blood sugars and impact on wound healing Treatment Activities: Patient referred to Primary Care Physician for further nutritional evaluation : 11/09/2022 Notes: Wound/Skin Impairment Nursing Diagnoses: Impaired tissue integrity Knowledge deficit related to ulceration/compromised skin integrity Goals: Patient/caregiver will verbalize understanding of skin care regimen Date Initiated: 11/09/2022 Target Resolution Date: 12/07/2022 Goal Status: Active Ulcer/skin breakdown will have a volume reduction of 30% by week 4 Date Initiated: 11/09/2022 Target Resolution Date: 12/07/2022 Goal Status: Active Interventions: Assess patient/caregiver ability to obtain necessary supplies Assess patient/caregiver ability to perform ulcer/skin care regimen upon admission and as needed Assess ulceration(s) every visit Provide education on ulcer and skin care Treatment Activities: Skin care regimen initiated : 11/09/2022 Topical wound management initiated : 11/09/2022 Notes: Electronic Signature(s) Signed: 11/09/2022 4:43:15 PM By: Zenaida Deed RN, BSN Entered By: Zenaida Deed on 11/09/2022 09:51:03 -------------------------------------------------------------------------------- Pain Assessment Details Patient Name: Date of Service: [name], [name]. 11/09/2022 9:00 A M Medical Record Number: [id number] Patient Account Number: [id number] Date of Birth/Sex: Treating RN: Jun 22, 1955 (67 y.o. Damaris Schooner Primary Care Devita Nies: Arnette Felts Other Clinician: Referring Mithra Spano: Treating Arcadio Cope/Extender: Duane Lope Weeks in Treatment: 0 Active Problems Location of Pain Severity and Description of Pain Patient Has Paino No Site Locations Rate the pain. ZEVON, YOUMANS ([id number])  [id number] Page 6 of 8 Rate the pain. Current Pain Level: 0 Character of Pain Describe the Pain: Tender Pain Management and Medication Current Pain Management: Electronic Signature(s) Signed: 11/09/2022 4:43:15 PM By: Zenaida Deed RN, BSN Entered By: Zenaida Deed on 11/09/2022 09:40:07 -------------------------------------------------------------------------------- Patient/Caregiver Education Details Patient Name: Date of Service: [name], [name]. 6/18/2024andnbsp9:00 A M Medical Record Number: [id number] Patient Account Number: [id number] Date of Birth/Gender: Treating RN: 1955-06-23 (67 y.o. Damaris Schooner Primary Care Physician: Arnette Felts Other Clinician: Referring Physician: Treating Physician/Extender: Sarita Haver in Treatment: 0 Education Assessment Education Provided To: Patient Education Topics Provided Welcome [facility]New [facility] Packet: o Handouts: Welcome [facility] o Methods: Explain/Verbal, Printed Responses: Reinforcements needed, State content correctly Wound/Skin Impairment: Methods: Explain/Verbal, Printed Responses: Reinforcements needed, State content correctly Electronic Signature(s) Signed: 11/09/2022 4:43:15 PM By: Zenaida Deed RN, BSN Entered By: Zenaida Deed on 11/09/2022 09:51:36 Kathie Dike ([id number]) [id number] Page 7 of 8 -------------------------------------------------------------------------------- Wound Assessment Details Patient Name: Date of Service: Darden Amber NT, [place] BSO N M. 11/09/2022 9:00 A M Medical Record Number: [id number] Patient Account Number: [id number] Date of Birth/Sex: Treating RN: [birth year] (67 y.o. 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 Wound Status: Open Wounding Event: Pressure Injury Comorbid History: Cataracts, Hypertension, Type II Diabetes, Osteoarthritis Date Acquired: 08/06/2022 Weeks Of Treatment: 0 Clustered Wound: No Photos Wound Measurements Length: (cm) 0 Width: (cm) 0 Depth: (cm) 0 Area: (cm) Volume: (cm) .3 % Reduction in Area: .4 % Reduction in Volume: .1 Epithelialization: Large (67-100%) 0.094 Tunneling: No 0.009 Undermining: No Wound Description Classification: Grade 1 Wound Margin: Flat and Intact Exudate Amount: Small Exudate Type: Serous Exudate Color: amber Foul Odor After Cleansing: No Slough/Fibrino Yes Wound Bed Granulation Amount: None Present (0%) Exposed Structure Necrotic Amount: Large (67-100%) Fascia Exposed: No Necrotic Quality: Eschar Fat Layer (Subcutaneous Tissue) Exposed: Yes Tendon Exposed: No Muscle Exposed: No Joint Exposed: No Bone Exposed: No Periwound Skin Texture Texture Color No Abnormalities Noted: No No Abnormalities Noted: Yes Callus: No Temperature / Pain Scarring: Yes Temperature: No Abnormality Tenderness on Palpation: Yes Moisture No Abnormalities Noted: Yes Treatment Notes Wound #1 (Amputation Site - Below Knee) Wound Laterality: Right Cleanser KINGSLY, SCURRY ([id number]) [id number] Page 8 of 8 Peri-Wound Care Topical Triple Antibiotic Ointment, 1 (oz) Tube Primary Dressing Secondary Dressing Zetuvit Plus Silicone Border Dressing 4x4 (in/in) Discharge Instruction: Apply silicone border over primary dressing as directed. Secured With Compression Wrap Compression Stockings Facilities manager) Signed: 11/09/2022 4:43:15 PM By: Zenaida Deed RN, BSN Entered By: Zenaida Deed on 11/09/2022  09:39:15 -------------------------------------------------------------------------------- Vitals Details Patient Name: Date of Service: [name], [name]. 11/09/2022 9:00 A M Medical Record Number: [id number] Patient Account Number: [id number] Date of Birth/Sex: Treating RN: Jan 19, 1956 (67 y.o. Damaris Schooner Primary Care Quentyn Kolbeck: Arnette Felts Other Clinician: Referring Sani Madariaga: Treating Korri Ask/Extender: Duane Lope Weeks in Treatment: 0 Vital Signs Time Taken: 09:19 Temperature (F): 98. Height (in): 66 Pulse (bpm): 63 Source: Stated Respiratory Rate (breaths/min): 18 Weight (lbs): 203 Blood Pressure (mmHg): 119/76 Body Mass Index (BMI): 32.8 Capillary Blood Glucose (mg/dl): 474 Reference Range: 80 - 120 mg / dl Notes glucose per pt report yesterday Electronic Signature(s) Signed: 11/09/2022 4:43:15 PM By: Zenaida Deed RN, BSN Entered By: Zenaida Deed on 11/09/2022 09:21:31

## 2022-11-10 DIAGNOSIS — L97819 Non-pressure chronic ulcer of other part of right lower leg with unspecified severity: Secondary | ICD-10-CM | POA: Diagnosis not present

## 2022-11-10 DIAGNOSIS — E11622 Type 2 diabetes mellitus with other skin ulcer: Secondary | ICD-10-CM | POA: Diagnosis not present

## 2022-11-16 ENCOUNTER — Encounter (HOSPITAL_BASED_OUTPATIENT_CLINIC_OR_DEPARTMENT_OTHER): Payer: Commercial Managed Care - PPO | Admitting: General Surgery

## 2022-11-16 DIAGNOSIS — E1151 Type 2 diabetes mellitus with diabetic peripheral angiopathy without gangrene: Secondary | ICD-10-CM | POA: Diagnosis not present

## 2022-11-16 DIAGNOSIS — Z8673 Personal history of transient ischemic attack (TIA), and cerebral infarction without residual deficits: Secondary | ICD-10-CM | POA: Diagnosis not present

## 2022-11-16 DIAGNOSIS — E11622 Type 2 diabetes mellitus with other skin ulcer: Secondary | ICD-10-CM | POA: Diagnosis not present

## 2022-11-16 DIAGNOSIS — E119 Type 2 diabetes mellitus without complications: Secondary | ICD-10-CM | POA: Diagnosis not present

## 2022-11-16 DIAGNOSIS — Z89511 Acquired absence of right leg below knee: Secondary | ICD-10-CM | POA: Diagnosis not present

## 2022-11-16 DIAGNOSIS — I10 Essential (primary) hypertension: Secondary | ICD-10-CM | POA: Diagnosis not present

## 2022-11-16 DIAGNOSIS — L97819 Non-pressure chronic ulcer of other part of right lower leg with unspecified severity: Secondary | ICD-10-CM | POA: Diagnosis not present

## 2022-11-16 DIAGNOSIS — M1990 Unspecified osteoarthritis, unspecified site: Secondary | ICD-10-CM | POA: Diagnosis not present

## 2022-11-16 NOTE — Progress Notes (Signed)
TAYARI, YANKEE ([id number]) [id number] Page 1 of 7 Visit Report for 11/16/2022 Arrival Information Details Patient Name: Date of Service: [name], [name]. 11/16/2022 8:30 A [name] Medical Record Number: [id number] Patient Account Number: [id number] Date of Birth/Sex: Treating RN: 05-Aug-1955 (67 y.o. [name] Primary Care Noella Kipnis: Arnette Felts Other Clinician: Referring Tiwana Chavis: Treating Maudy Yonan/Extender: Sarita Haver in Treatment: 1 Visit Information History Since Last Visit Added or deleted any medications: No Patient Arrived: Ambulatory Any new allergies or adverse reactions: No Arrival Time: 08:30 Had a fall or experienced change in No Accompanied By: self activities of daily living that may affect Transfer Assistance: None risk of falls: Patient Identification Verified: Yes Signs or symptoms of abuse/neglect since last visito No Secondary Verification Process Completed: Yes Hospitalized since last visit: No Patient Requires Transmission-Based Precautions: No Implantable device outside of the clinic excluding No Patient Has Alerts: No cellular tissue based products placed in the center since last visit: Has Dressing in Place as Prescribed: Yes Pain Present Now: No Electronic Signature(s) Signed: 11/16/2022 3:31:57 PM By: Samuella Bruin Entered By: Samuella Bruin on 11/16/2022 08:30:59 -------------------------------------------------------------------------------- Clinic Level of Care Assessment Details Patient Name: Date of Service: [name], [name]. 11/16/2022 8:30 A [name] Medical Record Number: [id number] Patient Account Number: [id number] Date of Birth/Sex: Treating RN: [birth year] (67 y.o. [name] Primary Care Valena Ivanov: Arnette Felts Other Clinician: Referring Eisa Conaway: Treating Mozell Haber/Extender: Sarita Haver in Treatment: 1 Clinic Level of Care Assessment  Items TOOL 4 Quantity Score []  - 0 Use when only an EandM is performed on FOLLOW-UP visit ASSESSMENTS - Nursing Assessment / Reassessment []  - 0 Reassessment of Co-morbidities (includes updates in patient status) X- 1 5 Reassessment of Adherence to Treatment Plan ASSESSMENTS - Wound and Skin A ssessment / Reassessment X - Simple Wound Assessment / Reassessment - one wound 1 5 []  - 0 Complex Wound Assessment / Reassessment - multiple wounds []  - 0 Dermatologic / Skin Assessment (not related to wound area) ASSESSMENTS - Focused Assessment []  - 0 Circumferential Edema Measurements - multi extremities []  - 0 Nutritional Assessment / Counseling / Intervention KALIK, HOARE ([id number]) [id number] Page 2 of 7 []  - 0 Lower Extremity Assessment (monofilament, tuning fork, pulses) []  - 0 Peripheral Arterial Disease Assessment (using hand held doppler) ASSESSMENTS - Ostomy and/or Continence Assessment and Care []  - 0 Incontinence Assessment and Management []  - 0 Ostomy Care Assessment and Management (repouching, etc.) PROCESS - Coordination of Care X - Simple Patient / Family Education for ongoing care 1 15 []  - 0 Complex (extensive) Patient / Family Education for ongoing care X- 1 10 Staff obtains Chiropractor, Records, T Results / Process Orders est []  - 0 Staff telephones HHA, Nursing Homes / Clarify orders / etc []  - 0 Routine Transfer to another Facility (non-emergent condition) []  - 0 Routine Hospital Admission (non-emergent condition) []  - 0 New Admissions / Manufacturing engineer / Ordering NPWT Apligraf, etc. , []  - 0 Emergency Hospital Admission (emergent condition) X- 1 10 Simple Discharge Coordination []  - 0 Complex (extensive) Discharge Coordination PROCESS - Special Needs []  - 0 Pediatric / Minor Patient Management []  - 0 Isolation Patient Management []  - 0 Hearing / Language / Visual special needs []  - 0 Assessment of  Community assistance (transportation, D/C planning, etc.) []  - 0 Additional assistance / Altered mentation []  - 0 Support Surface(s) Assessment (bed, cushion, seat, etc.) INTERVENTIONS - Wound Cleansing / Measurement []  -  0 Simple Wound Cleansing - one wound []  - 0 Complex Wound Cleansing - multiple wounds X- 1 5 Wound Imaging (photographs - any number of wounds) []  - 0 Wound Tracing (instead of photographs) []  - 0 Simple Wound Measurement - one wound []  - 0 Complex Wound Measurement - multiple wounds INTERVENTIONS - Wound Dressings X - Small Wound Dressing one or multiple wounds 1 10 []  - 0 Medium Wound Dressing one or multiple wounds []  - 0 Large Wound Dressing one or multiple wounds []  - 0 Application of Medications - topical []  - 0 Application of Medications - injection INTERVENTIONS - Miscellaneous []  - 0 External ear exam []  - 0 Specimen Collection (cultures, biopsies, blood, body fluids, etc.) []  - 0 Specimen(s) / Culture(s) sent or taken to Lab for analysis []  - 0 Patient Transfer (multiple staff / Nurse, adult / Similar devices) []  - 0 Simple Staple / Suture removal (25 or less) []  - 0 Complex Staple / Suture removal (26 or more) []  - 0 Hypo / Hyperglycemic Management (close monitor of Blood Glucose) [name], [name] ([id number]) [phone].pdf Page 3 of 7 []  - 0 Ankle / Brachial Index (ABI) - do not check if billed separately X- 1 5 Vital Signs Has the patient been seen at the hospital within the last three years: Yes Total Score: 65 Level Of Care: New/Established - Level 2 Electronic Signature(s) Signed: 11/16/2022 3:31:57 PM By: Samuella Bruin Entered By: Samuella Bruin on 11/16/2022 08:44:26 -------------------------------------------------------------------------------- Encounter Discharge Information Details Patient Name: Date of Service: [name], [name]. 11/16/2022 8:30 A [name] Medical Record Number:  [id number] Patient Account Number: [id number] Date of Birth/Sex: Treating RN: [birth year] (66 y.o. [name] Primary Care Wendelyn Kiesling: Arnette Felts Other Clinician: Referring Nikhita Mentzel: Treating Thadeus Gandolfi/Extender: Sarita Haver in Treatment: 1 Encounter Discharge Information Items Discharge Condition: Stable Ambulatory Status: Ambulatory Discharge Destination: Home Transportation: Private Auto Accompanied By: self Schedule Follow-up Appointment: No Clinical Summary of Care: Patient Declined Electronic Signature(s) Signed: 11/16/2022 3:31:57 PM By: Gelene Mink By: Samuella Bruin on 11/16/2022 08:43:58 -------------------------------------------------------------------------------- Lower Extremity Assessment Details Patient Name: Date of Service: [name], [name]. 11/16/2022 8:30 A [name] Medical Record Number: [id number] Patient Account Number: [id number] Date of Birth/Sex: Treating RN: [birth year] (67 y.o. [name] Primary Care Brelan Hannen: Arnette Felts Other Clinician: Referring Sajid Ruppert: Treating Norwin Aleman/Extender: Duane Lope Weeks in Treatment: 1 Electronic Signature(s) Signed: 11/16/2022 3:31:57 PM By: Gelene Mink By: Samuella Bruin on 11/16/2022 08:31:17 -------------------------------------------------------------------------------- Multi Wound Chart Details Patient Name: Date of Service: [name], [name]. 11/16/2022 8:30 A [name] Medical Record Number: [id number] Patient Account Number: [id number] [name], [name] ([id number]) [phone].pdf Page 4 of 7 Date of Birth/Sex: Treating RN: [birth year] (67 y.o. [name]) Primary Care Marcelina Mclaurin: Other Clinician: Arnette Felts Referring Creedence Kunesh: Treating Iara Monds/Extender: Sarita Haver in Treatment: 1 Vital Signs Height(in): 66 Pulse(bpm): 60 Weight(lbs): 203 Blood Pressure(mmHg): 119/77 Body  Mass Index(BMI): 32.8 Temperature(F): 98 Respiratory Rate(breaths/min): 18 [1:Photos:] [N/A:N/A] Right Amputation Site - Below Knee N/A N/A Wound Location: Pressure Injury N/A N/A Wounding Event: Diabetic Wound/Ulcer of the Lower N/A N/A Primary Etiology: Extremity Cataracts, Hypertension, Type II N/A N/A Comorbid History: Diabetes, Osteoarthritis 08/06/2022 N/A N/A Date Acquired: 1 N/A N/A Weeks of Treatment: Open N/A N/A Wound Status: No N/A N/A Wound Recurrence: 0x0x0 N/A N/A Measurements L x W x D (cm) 0 N/A N/A A (cm) : rea 0 N/A N/A Volume (cm) : 100.00%  N/A N/A % Reduction in A rea: 100.00% N/A N/A % Reduction in Volume: Grade 1 N/A N/A Classification: None Present N/A N/A Exudate A mount: Flat and Intact N/A N/A Wound Margin: None Present (0%) N/A N/A Granulation A mount: None Present (0%) N/A N/A Necrotic A mount: Fascia: No N/A N/A Exposed Structures: Fat Layer (Subcutaneous Tissue): No Tendon: No Muscle: No Joint: No Bone: No Large (67-100%) N/A N/A Epithelialization: Scarring: Yes N/A N/A Periwound Skin Texture: Callus: No No Abnormalities Noted N/A N/A Periwound Skin Moisture: No Abnormalities Noted N/A N/A Periwound Skin Color: No Abnormality N/A N/A Temperature: Yes N/A N/A Tenderness on Palpation: Treatment Notes Electronic Signature(s) Signed: 11/16/2022 8:40:15 AM By: [name] Entered By: Duanne Guess on 11/16/2022 08:40:15 -------------------------------------------------------------------------------- Multi-Disciplinary Care Plan Details Patient Name: Date of Service: [name], [name]. 11/16/2022 8:30 A [name] Medical Record Number: [id number] Patient Account Number: [id number] Date of Birth/Sex: Treating RN: 1956/03/22 (67 y.o. [name] Primary Care Alline Pio: Arnette Felts Other Clinician: ROHN, [name] ([id number]) [id number] Page 5 of 7 Referring  Andreas Sobolewski: Treating Maurissa Ambrose/Extender: Duane Lope Weeks in Treatment: 1 Active Inactive Electronic Signature(s) Signed: 11/16/2022 3:31:57 PM By: Gelene Mink By: Samuella Bruin on 11/16/2022 08:43:04 -------------------------------------------------------------------------------- Pain Assessment Details Patient Name: Date of Service: [name], [name]. 11/16/2022 8:30 A [name] Medical Record Number: [id number] Patient Account Number: [id number] Date of Birth/Sex: Treating RN: 08-Mar-1956 (67 y.o. [name] Primary Care Mehreen Azizi: Arnette Felts Other Clinician: Referring Romonda Parker: Treating Aloys Hupfer/Extender: Duane Lope Weeks in Treatment: 1 Active Problems Location of Pain Severity and Description of Pain Patient Has Paino No Site Locations Rate the pain. Current Pain Level: 0 Pain Management and Medication Current Pain Management: Electronic Signature(s) Signed: 11/16/2022 3:31:57 PM By: Samuella Bruin Entered By: Samuella Bruin on 11/16/2022 08:31:15 -------------------------------------------------------------------------------- Patient/Caregiver Education Details Patient Name: Date of Service: [name], [name]. 6/25/2024andnbsp8:30 A [name] Medical Record Number: [id number] Patient Account Number: [id number] Date of Birth/Gender: Treating RN: July 23, 1955 (67 y.o. [name] Primary Care Physician: Arnette Felts Other Clinician: Referring Physician: Treating Physician/Extender: Duane Lope Salamatof Hamlin ([id number]) [phone].pdf Page 6 of 7 Weeks in Treatment: 1 Education Assessment Education Provided To: Patient Education Topics Provided Offloading: Methods: Explain/Verbal Responses: Reinforcements needed, State content correctly Electronic Signature(s) Signed: 11/16/2022 3:31:57 PM By: Samuella Bruin Entered By: Samuella Bruin on  11/16/2022 08:43:27 -------------------------------------------------------------------------------- Wound Assessment Details Patient Name: Date of Service: [name], [name]. 11/16/2022 8:30 A [name] Medical Record Number: [id number] Patient Account Number: [id number] Date of Birth/Sex: Treating RN: 09/15/1955 (67 y.o. 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 Wound Status: Open Wounding Event: Pressure Injury Comorbid History: Cataracts, Hypertension, Type II Diabetes, Osteoarthritis Date Acquired: 08/06/2022 Weeks Of Treatment: 1 Clustered Wound: No Photos Wound Measurements Length: (cm) Width: (cm) Depth: (cm) Area: (cm) Volume: (cm) 0 % Reduction in Area: 100% 0 % Reduction in Volume: 100% 0 Epithelialization: Large (67-100%) 0 Tunneling: No 0 Undermining: No Wound Description Classification: Grade 1 Wound Margin: Flat and Intact Exudate Amount: None Present Foul Odor After Cleansing: No Slough/Fibrino No Wound Bed Granulation Amount: None Present (0%) Exposed Structure Necrotic Amount: None Present (0%) Fascia Exposed: No JEOVANY, HUITRON ([id number]) [id number] Page  7 of 7 Fat Layer (Subcutaneous Tissue) Exposed: No Tendon Exposed: No Muscle Exposed: No Joint Exposed: No Bone Exposed: No Periwound Skin Texture Texture Color No Abnormalities Noted: No No Abnormalities Noted: Yes Callus: No Temperature / Pain Scarring: Yes Temperature: No Abnormality Tenderness on Palpation: Yes Moisture No Abnormalities Noted: Yes Electronic Signature(s) Signed: 11/16/2022 3:31:57 PM By: Samuella Bruin Entered By: Samuella Bruin on 11/16/2022  08:34:09 -------------------------------------------------------------------------------- Vitals Details Patient Name: Date of Service: [name], [name]. 11/16/2022 8:30 A [name] Medical Record Number: [id number] Patient Account Number: [id number] Date of Birth/Sex: Treating RN: 25-Jul-1955 (66 y.o. [name] Primary Care Edward Trevino: Arnette Felts Other Clinician: Referring Lashaye Fisk: Treating Seham Gardenhire/Extender: Duane Lope Weeks in Treatment: 1 Vital Signs Time Taken: 08:31 Temperature (F): 98 Height (in): 66 Pulse (bpm): 60 Weight (lbs): 203 Respiratory Rate (breaths/min): 18 Body Mass Index (BMI): 32.8 Blood Pressure (mmHg): 119/77 Reference Range: 80 - 120 mg / dl Electronic Signature(s) Signed: 11/16/2022 3:31:57 PM By: Samuella Bruin Entered By: Samuella Bruin on 11/16/2022 08:31:10

## 2022-11-16 NOTE — Progress Notes (Signed)
[name], [name] ([id number]) [id number] Page 1 of 7 Visit Report for 11/16/2022 Chief Complaint Document Details Patient Name: Date of Service: [place], [place] [name]. 11/16/2022 8:30 A M Medical Record Number: [id number] Patient Account Number: [id number] Date of Birth/Sex: Treating RN: 09/29/1955 (67 y.o. M) Primary Care Provider: Arnette Felts Other Clinician: Referring Provider: Treating Provider/Extender: Sarita Haver in Treatment: 1 Information Obtained from: Patient Chief Complaint Patients presents for treatment of an open diabetic ulcer Electronic Signature(s) Signed: 11/16/2022 8:40:22 AM By: [name] Entered By: Duanne Guess on 11/16/2022 08:40:22 -------------------------------------------------------------------------------- HPI Details Patient Name: Date of Service: [name], [name]. 11/16/2022 8:30 A M Medical Record Number: [id number] Patient Account Number: [id number] Date of Birth/Sex: Treating RN: [birth year] (67 y.o. M) Primary Care Provider: Arnette Felts Other Clinician: Referring Provider: Treating Provider/Extender: Duane Lope Weeks in Treatment: 1 History of Present Illness HPI Description: ADMISSION 11/09/2022 This is a 67 year old type II diabetic (last hemoglobin A1c 5.9%) with a history of right below-knee amputation. He has been having difficulty with the fit of his prosthesis and developed a wound on the end of his amputation stump in February. He has apparently gotten new liners and has been to the prosthetist. He got a silicone cap, but can't really use it until his wound is healed. He was apparently applying alcohol to the wound at one point but was instructed not to do so by his primary care provider. Due to lack of healing progress, he was referred to the wound care center for further evaluation and management. 11/16/2022: His wound is healed. Electronic  Signature(s) Signed: 11/16/2022 8:40:36 AM By: [name] Entered By: Duanne Guess on 11/16/2022 08:40:36 -------------------------------------------------------------------------------- Physical Exam Details Patient Name: Date of Service: [name], [name]. 11/16/2022 8:30 A M Medical Record Number: [id number] Patient Account Number: [id number] Date of Birth/Sex: Treating RN: 1955-09-01 (67 y.o. DUAINE, RADIN ([id number]) [id number] Page 2 of 7 Primary Care Provider: Arnette Felts Other Clinician: Referring Provider: Treating Provider/Extender: Duane Lope Weeks in Treatment: 1 Constitutional . . . . no acute distress. Respiratory Normal work of breathing on room air. Notes 11/16/2022: His wound is healed. Electronic Signature(s) Signed: 11/16/2022 8:41:25 AM By: [name] Entered By: Duanne Guess on 11/16/2022 08:41:25 -------------------------------------------------------------------------------- Physician Orders Details Patient Name: Date of Service: [name], [name]. 11/16/2022 8:30 A M Medical Record Number: [id number] Patient Account Number: [id number] Date of Birth/Sex: Treating RN: 10-Apr-1956 (67 y.o. 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 Off-Loading Other: - Continue to pad the area until you get your prosthesis refitted Electronic Signature(s) Signed: 11/16/2022 8:41:34 AM By: [name] Entered By: Duanne Guess on 11/16/2022 08:41:34 -------------------------------------------------------------------------------- Problem List Details Patient Name: Date of Service: [name], [name]. 11/16/2022 8:30 A M Medical Record Number: [id number] Patient Account Number: [id number] Date of Birth/Sex: Treating RN: 1955-12-28 (67 y.o. M) Primary Care Provider: Arnette Felts Other Clinician: Referring Provider: Treating Provider/Extender: Duane Lope Weeks in Treatment: 1  [address] [name], [name] ([id number]) [id number] Page 3 of 7 ICD-10 Encounter Code Description Active Date MDM Diagnosis 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 with unspecified 11/09/2022 No Yes severity Z89.511 Acquired absence of right leg below knee 11/09/2022 No Yes E11.622 Type 2 diabetes mellitus with other skin ulcer 11/09/2022 No Yes Inactive Problems Resolved Problems Electronic Signature(s) Signed: 11/16/2022 8:40:09 AM By: [name] Entered By: Duanne Guess on 11/16/2022 08:40:09 -------------------------------------------------------------------------------- Progress Note Details Patient Name: Date of Service: [name], [name]. 11/16/2022 8:30 A M Medical Record Number: [id number] Patient Account Number: [id number] Date of Birth/Sex: Treating RN: 02-24-1956 (67 y.o. M) Primary Care Provider: Arnette Felts Other Clinician: Referring Provider: Treating Provider/Extender: Sarita Haver in Treatment: 1 Subjective Chief Complaint Information obtained from Patient Patients presents for treatment of an open diabetic ulcer History of Present Illness (HPI) ADMISSION 11/09/2022 This is a 67 year old type II diabetic (last hemoglobin A1c 5.9%) with a history of right below-knee amputation. He has been having difficulty with the fit of his prosthesis and  developed a wound on the end of his amputation stump in February. He has apparently gotten new liners and has been to the prosthetist. He got a silicone cap, but can't really use it until his wound is healed. He was apparently applying alcohol to the wound at one point but was instructed not to do so by his primary care provider. Due to lack of healing progress, he was referred to the wound care center for further evaluation and management. 11/16/2022: His wound is healed. Patient History Information obtained from Patient, Chart. Family History Diabetes - Paternal Grandparents, Hypertension - Father,Paternal Grandparents,Maternal Grandparents, No family history of Cancer, Heart Disease, Hereditary Spherocytosis, Kidney Disease, Lung Disease, Seizures, Stroke, Thyroid Problems, Tuberculosis. Social History Never smoker, Marital Status - Married, Alcohol Use - Never, Drug Use - No History, Caffeine Use - Daily. Medical History Eyes Patient has history of Cataracts - early Cardiovascular Patient has history of Hypertension Endocrine Patient has history of Type II Diabetes Denies history of Type I Diabetes Genitourinary Denies history of End Stage Renal Disease Immunological CALLEN, VANCUREN ([id number]) [id number] Page 4 of 7 Denies history of Lupus Erythematosus, Raynauds, Scleroderma Integumentary (Skin) Denies history of History of Burn Musculoskeletal Patient has history of Osteoarthritis Oncologic Denies history of Received Chemotherapy, Received Radiation Psychiatric Denies history of Anorexia/bulimia, Confinement Anxiety Hospitalization/Surgery History - right BKA. - cholecystectomy. Medical A Surgical History Notes nd Respiratory hx PEs and long COVID Gastrointestinal hx pancreatitis Neurologic left MCA CVA d/t COVID Objective Constitutional no acute distress. Vitals Time Taken: 8:31 AM, Height: 66 in, Weight: 203 lbs, BMI: 32.8,  Temperature: 98 F, Pulse: 60 bpm, Respiratory Rate: 18 breaths/min, Blood Pressure: 119/77 mmHg. Respiratory Normal work of breathing on room air. General Notes: 11/16/2022: His wound is healed. Integumentary (Hair, Skin) Wound #1 status is Open. Original cause of wound was Pressure Injury. The date acquired was: 08/06/2022. The wound has been in treatment 1 weeks. The wound is located on the Right Amputation Site - Below Knee. The wound measures 0cm length x 0cm width x 0cm depth; 0cm^2 area and 0cm^3 volume. There is no tunneling or undermining noted. There is a none present amount of drainage noted. The wound margin is flat and intact. There is no granulation within the wound bed. There is no necrotic tissue within the wound bed. The periwound skin appearance had no abnormalities noted for moisture. The  periwound skin appearance had no abnormalities noted for color. The periwound skin appearance exhibited: Scarring. The periwound skin appearance did not exhibit: Callus. Periwound temperature was noted as No Abnormality. The periwound has tenderness on palpation. 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 Off-Loading: Other: - Continue to pad the area until you get your prosthesis refitted 11/16/2022: His wound is healed. He is now able to get his prosthesis remolded and refitted. I did recommend that he continue to apply extra padding to the site until that occurs. We will discharge him from the wound care center. He may follow-up as needed. Electronic Signature(s) Signed: 11/16/2022 8:45:23 AM By: [name] Kathie Dike ([id number]) [id number] Page 5 of 7 Entered By: Duanne Guess on  11/16/2022 08:45:23 -------------------------------------------------------------------------------- HxROS Details Patient Name: Date of Service: BR[place] NT, [place] BSO N M. 11/16/2022 8:30 A M Medical Record Number: [id number] Patient Account Number: [id number] Date of Birth/Sex: Treating RN: 1955/11/03 (67 y.o. M) Primary Care Provider: Arnette Felts Other Clinician: Referring Provider: Treating Provider/Extender: Sarita Haver in Treatment: 1 Information Obtained From Patient Chart Eyes Medical History: Positive for: Cataracts - early Respiratory Medical History: Past Medical History Notes: hx PEs and long COVID Cardiovascular Medical History: Positive for: Hypertension Gastrointestinal Medical History: Past Medical History Notes: hx pancreatitis Endocrine Medical History: Positive for: Type II Diabetes Negative for: Type I Diabetes Time with diabetes: 3 yrs Treated with: Oral agents Blood sugar tested every day: No Genitourinary Medical History: Negative for: End Stage Renal Disease Immunological Medical History: Negative for: Lupus Erythematosus; Raynauds; Scleroderma Integumentary (Skin) Medical History: Negative for: History of Burn Musculoskeletal Medical History: Positive for: Osteoarthritis Neurologic Medical History: Past Medical History Notes: left MCA CVA d/t BURT, PIATEK ([id number]) [id number] Page 6 of 7 Oncologic Medical History: Negative for: Received Chemotherapy; Received Radiation Psychiatric Medical History: Negative for: Anorexia/bulimia; Confinement Anxiety HBO Extended History Items Eyes: Cataracts Immunizations Pneumococcal Vaccine: Received Pneumococcal Vaccination: Yes Received Pneumococcal Vaccination On or After 60th Birthday: Yes Implantable Devices None Hospitalization / Surgery History Type of Hospitalization/Surgery right BKA cholecystectomy Family and Social  History Cancer: No; Diabetes: Yes - Paternal Grandparents; Heart Disease: No; Hereditary Spherocytosis: No; Hypertension: Yes - Father,Paternal Grandparents,Maternal Grandparents; Kidney Disease: No; Lung Disease: No; Seizures: No; Stroke: No; Thyroid Problems: No; Tuberculosis: No; Never smoker; Marital Status - Married; Alcohol Use: Never; Drug Use: No History; Caffeine Use: Daily; Financial Concerns: No; Food, Clothing or Shelter Needs: No; Support System Lacking: No; Transportation Concerns: No Electronic Signature(s) Signed: 11/16/2022 9:24:19 AM By: [name] Entered By: Duanne Guess on 11/16/2022 08:40:50 -------------------------------------------------------------------------------- SuperBill Details Patient Name: Date of Service: [name], [name]. 11/16/2022 Medical Record Number: [id number] Patient Account Number: [id number] Date of Birth/Sex: Treating RN: 11-18-1955 (67 y.o. [name] Primary Care Provider: Arnette Felts Other Clinician: Referring Provider: Treating Provider/Extender: Duane Lope Weeks in Treatment: 1 Diagnosis Coding ICD-10 Codes Code Description [phone] Non-pressure chronic ulcer of other part of right lower leg with unspecified severity Z89.511 Acquired absence of right leg below knee E11.622 Type 2 diabetes mellitus with other skin ulcer Facility Procedures : CPT4 Code: [id number] Description: 56387 - WOUND CARE VISIT-LEV 2 EST PT Modifier: Quantity: 1 Physician Procedures : CPT4 Code Description Modifier  [id number] - WC PHYS LEVEL 3 - EST PT ICD-10 Diagnosis Description JAEKWON, MCCLUNE ([id number]) [id number] 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 with unspecified  severity Z89.511 Acquired absence of right leg below knee E11.622 Type 2 diabetes mellitus with other skin ulcer Quantity: 1 227.pdf Page 7 of 7 Electronic Signature(s) Signed:  11/16/2022 8:45:38 AM By: [name] Entered By: Duanne Guess on 11/16/2022 08:45:38

## 2022-11-30 ENCOUNTER — Other Ambulatory Visit (HOSPITAL_COMMUNITY): Payer: Self-pay

## 2022-12-07 ENCOUNTER — Encounter: Payer: Self-pay | Admitting: Nurse Practitioner

## 2022-12-07 ENCOUNTER — Ambulatory Visit (INDEPENDENT_AMBULATORY_CARE_PROVIDER_SITE_OTHER): Payer: Commercial Managed Care - PPO | Admitting: Nurse Practitioner

## 2022-12-07 VITALS — BP 120/78 | HR 59 | Temp 98.0°F | Ht 66.0 in | Wt 202.4 lb

## 2022-12-07 DIAGNOSIS — E669 Obesity, unspecified: Secondary | ICD-10-CM | POA: Diagnosis not present

## 2022-12-07 DIAGNOSIS — Z0000 Encounter for general adult medical examination without abnormal findings: Secondary | ICD-10-CM | POA: Insufficient documentation

## 2022-12-07 DIAGNOSIS — Z1211 Encounter for screening for malignant neoplasm of colon: Secondary | ICD-10-CM

## 2022-12-07 DIAGNOSIS — Z79899 Other long term (current) drug therapy: Secondary | ICD-10-CM | POA: Diagnosis not present

## 2022-12-07 DIAGNOSIS — E782 Mixed hyperlipidemia: Secondary | ICD-10-CM | POA: Diagnosis not present

## 2022-12-07 DIAGNOSIS — I129 Hypertensive chronic kidney disease with stage 1 through stage 4 chronic kidney disease, or unspecified chronic kidney disease: Secondary | ICD-10-CM

## 2022-12-07 DIAGNOSIS — N182 Chronic kidney disease, stage 2 (mild): Secondary | ICD-10-CM | POA: Diagnosis not present

## 2022-12-07 DIAGNOSIS — Z125 Encounter for screening for malignant neoplasm of prostate: Secondary | ICD-10-CM | POA: Diagnosis not present

## 2022-12-07 DIAGNOSIS — E1169 Type 2 diabetes mellitus with other specified complication: Secondary | ICD-10-CM

## 2022-12-07 DIAGNOSIS — Z6832 Body mass index (BMI) 32.0-32.9, adult: Secondary | ICD-10-CM

## 2022-12-07 DIAGNOSIS — Z89619 Acquired absence of unspecified leg above knee: Secondary | ICD-10-CM

## 2022-12-07 LAB — POC HEMOCCULT BLD/STL (OFFICE/1-CARD/DIAGNOSTIC): Fecal Occult Blood, POC: NEGATIVE

## 2022-12-07 LAB — POCT URINALYSIS DIPSTICK
Bilirubin, UA: NEGATIVE
Blood, UA: NEGATIVE
Glucose, UA: NEGATIVE
Ketones, UA: NEGATIVE
Leukocytes, UA: NEGATIVE
Nitrite, UA: NEGATIVE
Protein, UA: NEGATIVE
Spec Grav, UA: 1.025 (ref 1.010–1.025)
Urobilinogen, UA: 0.2 E.U./dL
pH, UA: 7 (ref 5.0–8.0)

## 2022-12-07 MED ORDER — VALSARTAN-HYDROCHLOROTHIAZIDE 320-25 MG PO TABS
1.0000 | ORAL_TABLET | Freq: Every day | ORAL | Status: DC
Start: 2022-12-07 — End: 2022-12-24

## 2022-12-07 NOTE — Assessment & Plan Note (Signed)
Hemoglobin A1c is stable.  Continue metformin, tolerating well.  Diabetic foot exam done with no abnormal findings.

## 2022-12-07 NOTE — Assessment & Plan Note (Addendum)
Behavior modifications discussed and diet history reviewed.   Pt will continue to exercise regularly and modify diet with low GI, plant based foods and decrease intake of processed foods.  Recommend intake of daily multivitamin, Vitamin D, and calcium.  Recommend colonoscopy (reports GI said due next year) for preventive screenings, as well as recommend immunizations that include influenza, TDAP, and Shingles

## 2022-12-07 NOTE — Assessment & Plan Note (Signed)
Wound to stump has healed.  Will be going for his liner replacement and possible new prosthesis with [facility].  Will send any forms that are necessary to complete.

## 2022-12-07 NOTE — Assessment & Plan Note (Signed)
Negative hemocult.

## 2022-12-07 NOTE — Assessment & Plan Note (Addendum)
Blood pressure is well-controlled.  Continue current medications.  Refilled valsartan/hydrochlorothiazide. EKG done Sinus Bradycardia, WITHIN NORMAL LIMITS Heart rate 59

## 2022-12-07 NOTE — Progress Notes (Signed)
I,Jameka J Llittleton, CMA,acting as a Neurosurgeon for [employer], FNP.,have documented all relevant documentation on the behalf of Arnette Felts, FNP,as directed by  Arnette Felts, FNP while in the presence of Arnette Felts, FNP.  Subjective:   Patient ID: [name] , male    DOB: [birth year] , 67 y.o.   MRN: [id number]  Chief Complaint  Patient presents with   Annual Exam    HPI  Patient is here today for his annual exam. He has no other complaints today. He did go to the wound center who gave bandages to put on his stump. He will be in contact with [facility] for a new liner and possible new prosthesis.      Past Medical History:  Diagnosis Date   Acute ischemic left MCA stroke (HCC) 05/02/2019   AKI (acute kidney injury) (HCC) 04/09/2019   Community acquired pneumonia 04/09/2019   Diabetes mellitus without complication (HCC)    GERD (gastroesophageal reflux disease)    Hx of right BKA (HCC)    uses prosthetic    Hyperglycemia 04/09/2019   Hyperlipidemia    Hypertension    Left leg DVT (HCC) 09/22/2019   Liver disorder    tx with ursodiol    Lobar pneumonia (HCC)    Pneumonia due to COVID-19 virus 04/09/2019   Severe sepsis (HCC) 04/09/2019   Transaminitis 04/09/2019     Family History  Problem Relation Age of Onset   Colon polyps Father    Colon cancer Neg Hx    Rectal cancer Neg Hx    Stomach cancer Neg Hx      Current Outpatient Medications:    acetaminophen (TYLENOL) 500 MG tablet, Take 500 mg by mouth every 6 (six) hours as needed for mild pain or fever. , Disp: , Rfl:    Alcohol Swabs (ALCOHOL WIPES) 70 % PADS, Use to wipe skin to check CBG, Disp: 100 each, Rfl: 0   Ascorbic Acid (VITAMIN C) 1000 MG tablet, Take 1,000 mg by mouth daily., Disp: , Rfl:    aspirin 81 MG chewable tablet, Chew 81 mg by mouth daily., Disp: , Rfl:    atorvastatin (LIPITOR) 40 MG tablet, Take 1 tablet (40 mg total) by mouth daily., Disp: 90 tablet, Rfl: 1   blood glucose meter kit and  supplies KIT, Dispense based on patient and insurance preference. Use up to four times daily as directed. (FOR ICD-9 250.00, 250.01)., Disp: 1 each, Rfl: 0   Blood Pressure Monitor DEVI, Use to check blood pressure daily; I10.0 Hypertension, Disp: 1 each, Rfl: 0   famotidine (PEPCID) 20 MG tablet, Take 40 mg by mouth as needed. , Disp: , Rfl:    metFORMIN (GLUCOPHAGE) 500 MG tablet, Take 1 tablet (500 mg total) by mouth 2 (two) times daily with a meal., Disp: 180 tablet, Rfl: 1   Omega-3 Fatty Acids (FISH OIL PO), Take by mouth daily., Disp: , Rfl:    tamsulosin (FLOMAX) 0.4 MG CAPS capsule, Take 1 capsule (0.4 mg total) by mouth daily after supper., Disp: 90 capsule, Rfl: 0   ursodiol (ACTIGALL) 250 MG tablet, Take 2 tablets (500 mg total) by mouth daily., Disp: 180 tablet, Rfl: 0   VITAMIN D PO, Take 5,000 Units by mouth daily., Disp: , Rfl:    valsartan-hydrochlorothiazide (DIOVAN-HCT) 320-25 MG tablet, Take 1 tablet by mouth daily., Disp: , Rfl:    No Known Allergies   Men's preventive visit. Patient Health Questionnaire (PHQ-2) is  Garment/textile technologist Visit  from 12/07/2022 in [facility]  PHQ-2 Total Score 0     Patient is on a low carb diet most of the time. Exercise: walking 1/2 mile in the morning and 1 mile on weekend. Marital status: Married. Relevant history for alcohol use is:  Social History   Substance and Sexual Activity  Alcohol Use No   Relevant history for tobacco use is:  Social History   Tobacco Use  Smoking Status Never  Smokeless Tobacco Never  .   Review of Systems  Constitutional: Negative.   HENT: Negative.    Eyes: Negative.   Respiratory: Negative.    Cardiovascular: Negative.   Gastrointestinal: Negative.   Endocrine: Negative.   Genitourinary: Negative.   Musculoskeletal: Negative.   Skin: Negative.   Allergic/Immunologic: Negative.   Neurological: Negative.   Hematological: Negative.    Psychiatric/Behavioral: Negative.       Today's Vitals   12/07/22 0853  BP: 120/78  Pulse: (!) 59  Temp: 98 F (36.7 C)  Weight: 202 lb 6.4 oz (91.8 kg)  Height: 5\' 6"  (1.676 m)  PainSc: 0-No pain   Body mass index is 32.67 kg/m.  Wt Readings from Last 3 Encounters:  12/07/22 202 lb 6.4 oz (91.8 kg)  10/20/22 203 lb 12.8 oz (92.4 kg)  10/05/22 203 lb 9.6 oz (92.4 kg)    Objective:  Physical Exam Vitals reviewed.  Constitutional:      General: He is not in acute distress.    Appearance: Normal appearance. He is obese.  HENT:     Head: Normocephalic and atraumatic.     Right Ear: Tympanic membrane, ear canal and external ear normal. There is no impacted cerumen.     Left Ear: Tympanic membrane, ear canal and external ear normal. There is no impacted cerumen.  Cardiovascular:     Rate and Rhythm: Normal rate and regular rhythm.     Pulses: Normal pulses.     Heart sounds: Normal heart sounds. No murmur heard. Pulmonary:     Effort: Pulmonary effort is normal. No respiratory distress.     Breath sounds: Normal breath sounds. No wheezing.  Abdominal:     General: Abdomen is flat. Bowel sounds are normal. There is no distension.     Palpations: Abdomen is soft.  Genitourinary:    Prostate: Normal.     Rectum: Guaiac result negative.  Musculoskeletal:        General: No swelling or deformity.     Cervical back: Normal range of motion and neck supple.     Comments: Right AKA with prosthesis  Skin:    General: Skin is warm and dry.     Capillary Refill: Capillary refill takes less than 2 seconds.  Neurological:     General: No focal deficit present.     Mental Status: He is alert and oriented to person, place, and time.     Cranial Nerves: No cranial nerve deficit.  Psychiatric:        Mood and Affect: Mood normal.        Behavior: Behavior normal.        Thought Content: Thought content normal.        Judgment: Judgment normal.         Assessment And Plan:     Encounter for general adult medical examination w/o abnormal findings Assessment & Plan: Behavior modifications discussed and diet history reviewed.   Pt will continue to exercise regularly and modify diet with  low GI, plant based foods and decrease intake of processed foods.  Recommend intake of daily multivitamin, Vitamin D, and calcium.  Recommend colonoscopy (reports GI said due next year) for preventive screenings, as well as recommend immunizations that include influenza, TDAP, and Shingles    Encounter for prostate cancer screening Assessment & Plan: Prostate is normal, will check PSA  Orders: -     PSA, total and free  Benign hypertension with CKD (chronic kidney disease), stage II Assessment & Plan: Blood pressure is well-controlled.  Continue current medications.  Refilled valsartan/hydrochlorothiazide. EKG done Sinus Bradycardia, WITHIN NORMAL LIMITS Heart rate 59   Orders: -     POCT urinalysis dipstick -     Microalbumin / creatinine urine ratio -     EKG 12-Lead -     Valsartan-hydroCHLOROthiazide; Take 1 tablet by mouth daily.  Mixed hyperlipidemia Assessment & Plan: Cholesterol levels are stable.  Continue statin, tolerating well.  Orders: -     Lipid panel  Type 2 diabetes mellitus with obesity (HCC) Assessment & Plan: Hemoglobin A1c is stable.  Continue metformin, tolerating well.  Diabetic foot exam done with no abnormal findings.   Other long term (current) drug therapy -     CBC  History of amputation of lower extremity (HCC) Assessment & Plan: Wound to stump has healed.  Will be going for his liner replacement and possible new prosthesis with [facility].  Will send any forms that are necessary to complete.   Encounter for Hemoccult screening Assessment & Plan: Negative hemocult  Orders: -     POC Hemoccult Bld/Stl (1-Cd Office Dx)   Return for 1 year physical.  Patient was given opportunity to ask questions. Patient verbalized  understanding of the plan and was able to repeat key elements of the plan. All questions were answered to their satisfaction.   Arnette Felts, FNP  I, Arnette Felts, FNP, have reviewed all documentation for this visit. The documentation on 12/07/22 for the exam, diagnosis, procedures, and orders are all accurate and complete.  This office note has been dictated.

## 2022-12-07 NOTE — Assessment & Plan Note (Signed)
Prostate is normal, will check PSA

## 2022-12-07 NOTE — Assessment & Plan Note (Deleted)
Hemoglobin A1c is stable.  Continue metformin, tolerating well.

## 2022-12-07 NOTE — Assessment & Plan Note (Signed)
Cholesterol levels are stable.  Continue statin, tolerating well.

## 2022-12-08 LAB — MICROALBUMIN / CREATININE URINE RATIO
Creatinine, Urine: 91.2 mg/dL
Microalb/Creat Ratio: 19 mg/g creat (ref 0–29)
Microalbumin, Urine: 17.6 ug/mL

## 2022-12-08 LAB — LIPID PANEL
Chol/HDL Ratio: 2.1 ratio (ref 0.0–5.0)
Cholesterol, Total: 122 mg/dL (ref 100–199)
HDL: 58 mg/dL (ref >39)
LDL Chol Calc (NIH): 51 mg/dL (ref 0–99)
Triglycerides: 56 mg/dL (ref 0–149)
VLDL Cholesterol Cal: 13 mg/dL (ref 5–40)

## 2022-12-08 LAB — CBC
Hematocrit: 44.7 % (ref 37.5–51.0)
Hemoglobin: 14.6 g/dL (ref 13.0–17.7)
MCH: 31.2 pg (ref 26.6–33.0)
MCHC: 32.7 g/dL (ref 31.5–35.7)
MCV: 96 fL (ref 79–97)
Platelets: 212 10*3/uL (ref 150–450)
RBC: 4.68 x10E6/uL (ref 4.14–5.80)
RDW: 12.5 % (ref 11.6–15.4)
WBC: 5.8 10*3/uL (ref 3.4–10.8)

## 2022-12-08 LAB — PSA, TOTAL AND FREE
PSA, Free Pct: 61.1 %
PSA, Free: 0.55 ng/mL
Prostate Specific Ag, Serum: 0.9 ng/mL (ref 0.0–4.0)

## 2022-12-09 ENCOUNTER — Encounter: Payer: Self-pay | Admitting: Nurse Practitioner

## 2022-12-11 ENCOUNTER — Other Ambulatory Visit: Payer: Self-pay | Admitting: Nurse Practitioner

## 2022-12-13 ENCOUNTER — Other Ambulatory Visit: Payer: Self-pay

## 2022-12-13 ENCOUNTER — Other Ambulatory Visit (HOSPITAL_COMMUNITY): Payer: Self-pay

## 2022-12-13 MED ORDER — TAMSULOSIN HCL 0.4 MG PO CAPS
0.4000 mg | ORAL_CAPSULE | Freq: Every day | ORAL | 0 refills | Status: DC
  Filled 2022-12-13: qty 90, 90d supply, fill #0

## 2022-12-24 ENCOUNTER — Other Ambulatory Visit: Payer: Self-pay | Admitting: Nurse Practitioner

## 2022-12-24 DIAGNOSIS — I129 Hypertensive chronic kidney disease with stage 1 through stage 4 chronic kidney disease, or unspecified chronic kidney disease: Secondary | ICD-10-CM

## 2022-12-27 ENCOUNTER — Other Ambulatory Visit: Payer: Self-pay

## 2022-12-27 ENCOUNTER — Other Ambulatory Visit (HOSPITAL_COMMUNITY): Payer: Self-pay

## 2022-12-27 MED ORDER — VALSARTAN-HYDROCHLOROTHIAZIDE 320-25 MG PO TABS: 1.0000 | ORAL_TABLET | Freq: Every day | ORAL | Status: DC

## 2022-12-27 MED ORDER — URSODIOL 250 MG PO TABS
500.0000 mg | ORAL_TABLET | Freq: Every day | ORAL | 0 refills | Status: DC
  Filled 2022-12-27: qty 180, 90d supply, fill #0

## 2022-12-31 ENCOUNTER — Other Ambulatory Visit: Payer: Self-pay | Admitting: Nurse Practitioner

## 2022-12-31 ENCOUNTER — Encounter: Payer: Self-pay | Admitting: Nurse Practitioner

## 2022-12-31 DIAGNOSIS — I129 Hypertensive chronic kidney disease with stage 1 through stage 4 chronic kidney disease, or unspecified chronic kidney disease: Secondary | ICD-10-CM

## 2022-12-31 DIAGNOSIS — L97819 Non-pressure chronic ulcer of other part of right lower leg with unspecified severity: Secondary | ICD-10-CM | POA: Diagnosis not present

## 2022-12-31 DIAGNOSIS — E11622 Type 2 diabetes mellitus with other skin ulcer: Secondary | ICD-10-CM | POA: Diagnosis not present

## 2022-12-31 MED ORDER — VALSARTAN-HYDROCHLOROTHIAZIDE 320-25 MG PO TABS
1.0000 | ORAL_TABLET | Freq: Every day | ORAL | Status: DC
Start: 2022-12-31 — End: 2023-01-17

## 2023-01-10 ENCOUNTER — Other Ambulatory Visit: Payer: Self-pay | Admitting: Nurse Practitioner

## 2023-01-10 ENCOUNTER — Other Ambulatory Visit: Payer: Self-pay

## 2023-01-10 ENCOUNTER — Other Ambulatory Visit (HOSPITAL_COMMUNITY): Payer: Self-pay

## 2023-01-11 ENCOUNTER — Other Ambulatory Visit: Payer: Self-pay

## 2023-01-13 ENCOUNTER — Encounter: Payer: Self-pay | Admitting: Nurse Practitioner

## 2023-01-13 ENCOUNTER — Other Ambulatory Visit: Payer: Self-pay

## 2023-01-14 ENCOUNTER — Other Ambulatory Visit: Payer: Self-pay

## 2023-01-17 ENCOUNTER — Other Ambulatory Visit: Payer: Self-pay

## 2023-01-17 ENCOUNTER — Encounter: Payer: Self-pay | Admitting: Nurse Practitioner

## 2023-01-17 DIAGNOSIS — I129 Hypertensive chronic kidney disease with stage 1 through stage 4 chronic kidney disease, or unspecified chronic kidney disease: Secondary | ICD-10-CM

## 2023-01-17 DIAGNOSIS — E119 Type 2 diabetes mellitus without complications: Secondary | ICD-10-CM | POA: Diagnosis not present

## 2023-01-17 MED ORDER — VALSARTAN-HYDROCHLOROTHIAZIDE 320-25 MG PO TABS
1.0000 | ORAL_TABLET | Freq: Every day | ORAL | 1 refills | Status: DC
Start: 2023-01-17 — End: 2023-12-08

## 2023-01-19 ENCOUNTER — Other Ambulatory Visit (HOSPITAL_COMMUNITY): Payer: Self-pay

## 2023-01-19 ENCOUNTER — Other Ambulatory Visit: Payer: Self-pay

## 2023-01-19 ENCOUNTER — Other Ambulatory Visit: Payer: Self-pay | Admitting: Nurse Practitioner

## 2023-01-19 ENCOUNTER — Encounter: Payer: Self-pay | Admitting: Nurse Practitioner

## 2023-01-19 MED ORDER — VALSARTAN-HYDROCHLOROTHIAZIDE 320-25 MG PO TABS
1.0000 | ORAL_TABLET | Freq: Every day | ORAL | 2 refills | Status: DC
Start: 2023-01-19 — End: 2023-11-21
  Filled 2023-01-19 (×2): qty 90, 90d supply, fill #0
  Filled 2023-05-02: qty 90, 90d supply, fill #1
  Filled 2023-07-27: qty 90, 90d supply, fill #2

## 2023-01-20 ENCOUNTER — Encounter: Payer: Self-pay | Admitting: Nurse Practitioner

## 2023-02-08 ENCOUNTER — Encounter: Payer: Self-pay | Admitting: Nurse Practitioner

## 2023-02-08 DIAGNOSIS — Z89511 Acquired absence of right leg below knee: Secondary | ICD-10-CM | POA: Diagnosis not present

## 2023-02-24 ENCOUNTER — Other Ambulatory Visit: Payer: Self-pay | Admitting: Nurse Practitioner

## 2023-02-24 DIAGNOSIS — E782 Mixed hyperlipidemia: Secondary | ICD-10-CM

## 2023-03-01 ENCOUNTER — Other Ambulatory Visit (HOSPITAL_COMMUNITY): Payer: Self-pay

## 2023-03-01 MED ORDER — METFORMIN HCL 500 MG PO TABS
500.0000 mg | ORAL_TABLET | Freq: Two times a day (BID) | ORAL | 1 refills | Status: DC
  Filled 2023-03-01: qty 180, 90d supply, fill #0
  Filled 2023-06-07: qty 180, 90d supply, fill #1

## 2023-03-01 MED ORDER — ATORVASTATIN CALCIUM 40 MG PO TABS
40.0000 mg | ORAL_TABLET | Freq: Every day | ORAL | 1 refills | Status: DC
  Filled 2023-03-01: qty 90, 90d supply, fill #0
  Filled 2023-06-07: qty 90, 90d supply, fill #1

## 2023-03-02 ENCOUNTER — Other Ambulatory Visit: Payer: Self-pay

## 2023-03-24 ENCOUNTER — Other Ambulatory Visit: Payer: Self-pay | Admitting: Nurse Practitioner

## 2023-03-24 ENCOUNTER — Other Ambulatory Visit: Payer: Self-pay

## 2023-03-24 MED ORDER — TAMSULOSIN HCL 0.4 MG PO CAPS
0.4000 mg | ORAL_CAPSULE | Freq: Every day | ORAL | 0 refills | Status: DC
  Filled 2023-03-24: qty 90, 90d supply, fill #0

## 2023-04-01 ENCOUNTER — Other Ambulatory Visit (HOSPITAL_COMMUNITY): Payer: Self-pay

## 2023-04-13 ENCOUNTER — Other Ambulatory Visit: Payer: Self-pay | Admitting: Nurse Practitioner

## 2023-04-14 ENCOUNTER — Other Ambulatory Visit (HOSPITAL_COMMUNITY): Payer: Self-pay

## 2023-04-14 ENCOUNTER — Other Ambulatory Visit: Payer: Self-pay

## 2023-04-14 MED ORDER — URSODIOL 250 MG PO TABS
500.0000 mg | ORAL_TABLET | Freq: Every day | ORAL | 0 refills | Status: DC
  Filled 2023-04-14: qty 180, 90d supply, fill #0

## 2023-05-02 ENCOUNTER — Other Ambulatory Visit (HOSPITAL_COMMUNITY): Payer: Self-pay

## 2023-06-07 ENCOUNTER — Other Ambulatory Visit (HOSPITAL_BASED_OUTPATIENT_CLINIC_OR_DEPARTMENT_OTHER): Payer: Self-pay

## 2023-06-20 ENCOUNTER — Other Ambulatory Visit (HOSPITAL_COMMUNITY): Payer: Self-pay

## 2023-06-20 ENCOUNTER — Other Ambulatory Visit: Payer: Self-pay | Admitting: Nurse Practitioner

## 2023-06-20 DIAGNOSIS — E782 Mixed hyperlipidemia: Secondary | ICD-10-CM

## 2023-06-20 MED ORDER — ATORVASTATIN CALCIUM 40 MG PO TABS
40.0000 mg | ORAL_TABLET | Freq: Every day | ORAL | 1 refills | Status: DC
  Filled 2023-06-20 – 2023-09-29 (×2): qty 90, 90d supply, fill #0

## 2023-06-22 ENCOUNTER — Ambulatory Visit: Payer: Commercial Managed Care - PPO | Admitting: Dietician

## 2023-06-28 ENCOUNTER — Ambulatory Visit: Payer: Commercial Managed Care - PPO | Admitting: Podiatry

## 2023-06-28 ENCOUNTER — Encounter: Payer: Self-pay | Admitting: Podiatry

## 2023-06-28 VITALS — Ht 66.0 in | Wt 202.4 lb

## 2023-06-28 DIAGNOSIS — E119 Type 2 diabetes mellitus without complications: Secondary | ICD-10-CM | POA: Diagnosis not present

## 2023-06-28 DIAGNOSIS — E1169 Type 2 diabetes mellitus with other specified complication: Secondary | ICD-10-CM | POA: Diagnosis not present

## 2023-06-28 DIAGNOSIS — E669 Obesity, unspecified: Secondary | ICD-10-CM | POA: Diagnosis not present

## 2023-06-28 NOTE — Progress Notes (Signed)
 Subjective: [name] presents today referred by Georgina Speaks, FNP for diabetic foot evaluation.  Patient relates many year history of diabetes.  Patient denies any history of foot wounds.  Patient denies any history of numbness, tingling, burning, pins/needles sensations.  Past Medical History:  Diagnosis Date   Acute ischemic left MCA stroke (HCC) 05/02/2019   AKI (acute kidney injury) (HCC) 04/09/2019   Community acquired pneumonia 04/09/2019   Diabetes mellitus without complication (HCC)    GERD (gastroesophageal reflux disease)    Hx of right BKA (HCC)    uses prosthetic    Hyperglycemia 04/09/2019   Hyperlipidemia    Hypertension    Left leg DVT (HCC) 09/22/2019   Liver disorder    tx with ursodiol     Lobar pneumonia (HCC)    Pneumonia due to COVID-19 virus 04/09/2019   Severe sepsis (HCC) 04/09/2019   Transaminitis 04/09/2019    Patient Active Problem List   Diagnosis Date Noted   Encounter for general adult medical examination w/o abnormal findings 12/07/2022   Benign hypertension with CKD (chronic kidney disease), stage II 12/07/2022   Type 2 diabetes mellitus with obesity (HCC) 12/07/2022   Other long term (current) drug therapy 12/07/2022   Encounter for prostate cancer screening 12/07/2022   Encounter for Hemoccult screening 12/07/2022   Wound of right leg 10/20/2022   History of pulmonary embolism 09/24/2019   Hx of right BKA (HCC) 06/25/2019   HLD (hyperlipidemia) 04/09/2019    Past Surgical History:  Procedure Laterality Date   BELOW KNEE LEG AMPUTATION Right    CHOLECYSTECTOMY     WISDOM TOOTH EXTRACTION      Current Outpatient Medications on File Prior to Visit  Medication Sig Dispense Refill   acetaminophen  (TYLENOL ) 500 MG tablet Take 500 mg by mouth every 6 (six) hours as needed for mild pain or fever.      Alcohol  Swabs (ALCOHOL  WIPES) 70 % PADS Use to wipe skin to check CBG 100 each 0   Ascorbic Acid  (VITAMIN C ) 1000 MG tablet Take  1,000 mg by mouth daily.     aspirin  81 MG chewable tablet Chew 81 mg by mouth daily.     atorvastatin  (LIPITOR) 40 MG tablet Take 1 tablet (40 mg total) by mouth daily. 90 tablet 1   blood glucose meter kit and supplies KIT Dispense based on patient and insurance preference. Use up to four times daily as directed. (FOR ICD-9 250.00, 250.01). 1 each 0   Blood Pressure Monitor DEVI Use to check blood pressure daily; I10.0 Hypertension 1 each 0   famotidine  (PEPCID ) 20 MG tablet Take 40 mg by mouth as needed.      metFORMIN  (GLUCOPHAGE ) 500 MG tablet Take 1 tablet (500 mg total) by mouth 2 (two) times daily with a meal. 180 tablet 1   Omega-3 Fatty Acids (FISH OIL PO) Take by mouth daily.     tamsulosin  (FLOMAX ) 0.4 MG CAPS capsule Take 1 capsule (0.4 mg total) by mouth daily after supper. 90 capsule 0   ursodiol  (ACTIGALL ) 250 MG tablet Take 2 tablets (500 mg total) by mouth daily. 180 tablet 0   valsartan -hydrochlorothiazide  (DIOVAN -HCT) 320-25 MG tablet Take 1 tablet by mouth daily. 90 tablet 1   valsartan -hydrochlorothiazide  (DIOVAN -HCT) 320-25 MG tablet Take 1 tablet by mouth daily. 90 tablet 2   VITAMIN D PO Take 5,000 Units by mouth daily.     No current facility-administered medications on file prior to visit.     No  Known Allergies  Social History   Occupational History   Not on file  Tobacco Use   Smoking status: Never   Smokeless tobacco: Never  Vaping Use   Vaping status: Never Used  Substance and Sexual Activity   Alcohol  use: No   Drug use: No   Sexual activity: Not on file    Family History  Problem Relation Age of Onset   Colon polyps Father    Colon cancer Neg Hx    Rectal cancer Neg Hx    Stomach cancer Neg Hx     Immunization History  Administered Date(s) Administered   Influenza,inj,Quad PF,6+ Mos 03/11/2019   Influenza-Unspecified 03/22/2020, 02/19/2021, 02/08/2022   Moderna Covid-19 Vaccine Bivalent Booster 9yrs & up 04/28/2021   Moderna  Sars-Covid-2 Vaccination 07/17/2019, 08/15/2019, 03/28/2020   PNEUMOCOCCAL CONJUGATE-20 10/05/2022   Tdap 01/13/2021   Zoster Recombinant(Shingrix ) 12/15/2020, 12/30/2021    Review of systems: Positive Findings in bold print.  Constitutional:  chills, fatigue, fever, sweats, weight change Communication: nurse, learning disability, sign presenter, broadcasting, hand writing, iPad/Android device Head: headaches, head injury Eyes: changes in vision, eye pain, glaucoma, cataracts, macular degeneration, diplopia, glare,  light sensitivity, eyeglasses or contacts, blindness Ears nose mouth throat: hearing impaired, hearing aids,  ringing in ears, deaf, sign language,  vertigo, nosebleeds,  rhinitis,  cold sores, snoring, swollen glands Cardiovascular: HTN, edema, arrhythmia, pacemaker in place, defibrillator in place, chest pain/tightness, chronic anticoagulation, blood clot, heart failure, MI Peripheral Vascular: leg cramps, varicose veins, blood clots, lymphedema, varicosities Respiratory:  asthma, difficulty breathing, denies congestion, SOB, wheezing, cough, emphysema Gastrointestinal: change in appetite or weight, abdominal pain, constipation, diarrhea, nausea, vomiting, vomiting blood, change in bowel habits, abdominal pain, jaundice, rectal bleeding, hemorrhoids, GERD Genitourinary:  nocturia,  pain on urination, polyuria,  blood in urine, Foley catheter, urinary urgency, ESRD on hemodialysis Musculoskeletal: amputation, cramping, stiff joints, painful joints, decreased joint motion, fractures, OA, gout, hemiplegia, paraplegia, uses cane, wheelchair bound, uses walker, uses rollator Skin: +changes in toenails, color change, dryness, itching, mole changes,  rash, wound(s) Neurological: headaches, numbness in feet, paresthesias in feet, burning in feet, fainting,  seizures, change in speech, migraines, memory problems/poor historian, cerebral palsy, weakness, paralysis, CVA, TIA Endocrine: diabetes, hypothyroidism,  hyperthyroidism,  goiter, dry mouth, flushing, heat intolerance, cold intolerance,  excessive thirst, denies polyuria,  nocturia Hematological:  easy bleeding, excessive bleeding, easy bruising, enlarged lymph nodes, on long term blood thinner, history of past transusions Allergy/immunological:  hives, eczema, frequent infections, multiple drug allergies, seasonal allergies, transplant recipient, multiple food allergies Psychiatric:  anxiety, depression, mood disorder, suicidal ideations, hallucinations, insomnia  Objective: There were no vitals filed for this visit. Vascular Examination: Capillary refill time less than 3 seconds x 5 digits.  Right side BKA  Dorsalis pedis pulses palpable 2 out of 4.  Posterior tibial pulses palpable 2 out of 4.  Digital hair not present x 10 digits.  Skin temperature gradient WNL b/l.  Dermatological Examination: Skin with normal turgor, texture and tone b/l  Toenails 1-5 left side discolored, thick, dystrophic with subungual debris and pain with palpation to nailbeds due to thickness of nails.  Right side BKA  Musculoskeletal: Muscle strength 5/5 to all LE muscle groups.  Right-sided BKA  Neurological: Sensation intact with 10 gram monofilament.  Vibratory sensation intact.  Assessment: NIDDM Encounter for diabetic foot examination  Plan: Discussed diabetic foot care principles. Literature dispensed on today. Patient to continue soft, supportive shoe gear daily. Patient to report any pedal injuries to medical professional  immediately. Follow up one year. Patient/POA to call should there be a concern in the interim.

## 2023-07-04 ENCOUNTER — Other Ambulatory Visit: Payer: Self-pay | Admitting: Nurse Practitioner

## 2023-07-04 ENCOUNTER — Other Ambulatory Visit (HOSPITAL_COMMUNITY): Payer: Self-pay

## 2023-07-04 MED ORDER — TAMSULOSIN HCL 0.4 MG PO CAPS
0.4000 mg | ORAL_CAPSULE | Freq: Every day | ORAL | 0 refills | Status: DC
  Filled 2023-07-04: qty 90, 90d supply, fill #0

## 2023-07-06 ENCOUNTER — Encounter: Payer: Self-pay | Admitting: Dietician

## 2023-07-06 ENCOUNTER — Encounter: Payer: Commercial Managed Care - PPO | Attending: Nurse Practitioner | Admitting: Dietician

## 2023-07-06 DIAGNOSIS — E785 Hyperlipidemia, unspecified: Secondary | ICD-10-CM

## 2023-07-06 DIAGNOSIS — E1169 Type 2 diabetes mellitus with other specified complication: Secondary | ICD-10-CM

## 2023-07-06 NOTE — Patient Instructions (Addendum)
Great job eating three meals a day, about 5-6 hours apart!  Begin to recognize carbohydrates, proteins, and non-starchy vegetables in your food choices!  Begin to build your meals using the proportions of the Balanced Plate. First, select your carb choice(s) for the meal. Make this 25% of your meal. Next, select your source of protein to pair with your carb choice(s). Make this another 25% of your meal. Finally, complete your meal with a variety of non-starchy vegetables. Make this the remaining 50% of your meal.  When eating away from home, try to build your meals using this model! You can get a to go   Choose low NET carb (Total carbs - Fiber) pastas, breads, and tortillas!!

## 2023-07-06 NOTE — Progress Notes (Signed)
Medical Nutrition Therapy  Appointment Start time:  1010  Appointment End time:  1100 Employee Wellness Visit 1 of 3 EID#: [id number]  Primary concerns today: Dietary Improvement  Referral diagnosis: E11.9 - T2DM, E78.00 - High Cholesterol, I10 - HTN Preferred learning style: No preference indicated Learning readiness: Ready   NUTRITION ASSESSMENT   Clinical Medical Hx: T2DM, HTN, HLD, R BKA, COVID-19 Medications: Atorvastatin, Metformin, Famotidine (PRN) Labs: (12/07/2022) Cholesterol - 122, LDL - 51 (10/05/2022) A1c - 5.9% Notable Signs/Symptoms: N/A   Lifestyle & Dietary Hx Pt reports following a low carb, low sodium diet for last ~4-5 years after getting DM diagnosis. Pt reports having to take large course of steroid r/t bad bout of COVID in 2020, which resulted in significant elevation of A1c.  Pt reports testing FBG Mon-Fri, values usually ~108 - 120 mg/dL Pt reports rarely missing lunch, eats 3 meals a day consistently. Pt reports trying to monitor starch (bread, pasta, etc.) intake, eat lean proteins, and vegetables.   Estimated daily fluid intake: 48 oz Supplements: Vit C, Vit D,  Sleep: Sleeps well Stress / self-care: Low, Current average weekly physical activity: ADLs, Walks 3/4 mile most mornings, 1 1/2 miles on some Saturdays   24-Hr Dietary Recall First Meal: Coffee (w/ half and half, Splenda), Carbmaster yogurt, PB crackers Snack: none Second Meal: Unwich ([place], Roast beef), Celsius Snack: none Third Meal: Broiled fish, greens, oatmeal raisin cookie, GingerAle ZERO Snack: none Beverages: Coffee, Water    NUTRITION DIAGNOSIS  NB-1.1 Food and nutrition-related knowledge deficit As related to DM.  As evidenced by inconsistent carbohydrate intake.   NUTRITION INTERVENTION  Nutrition education (E-1) on the following topics:  Educated patient on the pathophysiology of diabetes. This includes why our bodies need circulating blood sugar, the relationship between  insulin and blood sugar, and the results of insulin resistance and/or pancreatic insufficiency on the development of diabetes. Educated patient on factors that contribute to elevation of blood sugars, such as stress, illness, injury,and food choices. Discussed the role that physical activity plays in lowering blood sugar. Educate patient on the three main macronutrients. Protein, fats, and carbohydrates. Discussed how each of these macronutrients affect blood sugar levels, especially carbohydrate, and the importance of eating a consistent amount of carbohydrate throughout the day.    Handouts Provided Include  Balanced Plate  Learning Style & Readiness for Change Teaching method utilized: Visual & Auditory  Demonstrated degree of understanding via: Teach Back  Barriers to learning/adherence to lifestyle change: None  Goals Established by Pt Great job eating three meals a day, about 5-6 hours apart! Begin to recognize carbohydrates, proteins, and non-starchy vegetables in your food choices! Begin to build your meals using the proportions of the Balanced Plate. First, select your carb choice(s) for the meal. Make this 25% of your meal. Next, select your source of protein to pair with your carb choice(s). Make this another 25% of your meal. Finally, complete your meal with a variety of non-starchy vegetables. Make this the remaining 50% of your meal. When eating away from home, try to build your meals using this model! You can get a to go  Choose low NET carb (Total carbs - Fiber) pastas, breads, and tortillas!!   MONITORING & EVALUATION Carbohydrate intake, blood sugar/bloodpressure in 1 month.  Next Steps  Patient is to follow up for wellness visit 2 of 3.

## 2023-07-11 ENCOUNTER — Ambulatory Visit: Payer: Commercial Managed Care - PPO | Admitting: Dietician

## 2023-07-27 ENCOUNTER — Other Ambulatory Visit (HOSPITAL_COMMUNITY): Payer: Self-pay

## 2023-08-04 ENCOUNTER — Encounter: Payer: Commercial Managed Care - PPO | Attending: Nurse Practitioner | Admitting: Dietician

## 2023-08-04 ENCOUNTER — Encounter: Payer: Self-pay | Admitting: Dietician

## 2023-08-04 DIAGNOSIS — E669 Obesity, unspecified: Secondary | ICD-10-CM | POA: Insufficient documentation

## 2023-08-04 DIAGNOSIS — E1169 Type 2 diabetes mellitus with other specified complication: Secondary | ICD-10-CM | POA: Insufficient documentation

## 2023-08-04 NOTE — Patient Instructions (Addendum)
 Try to drink 2 of your tumblers of water per day to reach a goal of 64 oz. per day.  Moderate your intake of high fat snacks (peanuts) in the evening, stick to a portion of ~1/2 oz.  Consider adding in some resistance bands exercises 2 or 3 days a week for a total of 20-30 minutes. Consider trying a workout in the evening and see if your FBG values are a little lower.    Keep up the good work!!

## 2023-08-04 NOTE — Progress Notes (Signed)
 Medical Nutrition Therapy  Appointment Start time:  1205  Appointment End time:  1230 Employee Wellness Visit 2 of 3 EID#: [id number]  Primary concerns today: Dietary Improvement  Referral diagnosis: E11.9 - T2DM, E78.00 - High Cholesterol, I10 - HTN Preferred learning style: No preference indicated Learning readiness: Ready   NUTRITION ASSESSMENT   Clinical Medical Hx: T2DM, HTN, HLD, R BKA, COVID-19 Medications: Atorvastatin, Metformin, Famotidine (PRN) Labs: (12/07/2022) Cholesterol - 122, LDL - 51 (10/05/2022) A1c - 5.9% Notable Signs/Symptoms: N/A   Lifestyle & Dietary Hx Pt reports doing well with meal consistency, still eating similar dietary composition at meals, still continuing to moderate carbohydrate choices and prioritize vegetables.  Pt reports FBG is remaining 110 - 120 mg/dL. Pt reports snacking on peanuts one evening  and found their FBG to be slightly eleveated ~125 mg/dL.   Estimated daily fluid intake: 48 oz Supplements: Vit C, Vit D,  Sleep: Sleeps well Stress / self-care: Low, Current average weekly physical activity: ADLs, Walks 3/4 mile most mornings, 1 1/2 miles on some Saturdays   24-Hr Dietary Recall First Meal: Glucerna shake, Coffee Snack: none Second Meal: Spinach dip, celery sticks,  Snack: none Third Meal: Crab meat on salad, diced sweet potato, Sunkist ZERO Snack: none Beverages: Coffee, 32 oz. Water    NUTRITION DIAGNOSIS  NB-1.1 Food and nutrition-related knowledge deficit As related to DM.  As evidenced by inconsistent carbohydrate intake.   NUTRITION INTERVENTION  Nutrition education (E-1) on the following topics:  Educated patient on the pathophysiology of diabetes. This includes why our bodies need circulating blood sugar, the relationship between insulin and blood sugar, and the results of insulin resistance and/or pancreatic insufficiency on the development of diabetes. Educated patient on factors that contribute to elevation of blood  sugars, such as stress, illness, injury,and food choices. Discussed the role that physical activity plays in lowering blood sugar. Educate patient on the three main macronutrients. Protein, fats, and carbohydrates. Discussed how each of these macronutrients affect blood sugar levels, especially carbohydrate, and the importance of eating a consistent amount of carbohydrate throughout the day. Educated patient on the role of dietary fats on insulin resistance.   Handouts Provided Include  Balanced Plate  Learning Style & Readiness for Change Teaching method utilized: Visual & Auditory  Demonstrated degree of understanding via: Teach Back  Barriers to learning/adherence to lifestyle change: None  Goals Established by Pt Try to drink 2 of your tumblers of water per day to reach a goal of 64 oz. per day. Moderate your intake of high fat snacks (peanuts) in the evening, stick to a portion of ~1/2 oz. Consider adding in some resistance bands exercises 2 or 3 days a week for a total of 20-30 minutes. Consider trying a workout in the evening and see if your FBG values are a little lower.   Keep up the good work!!   MONITORING & EVALUATION Carbohydrate intake, blood sugar/bloodpressure in 1 month.  Next Steps  Patient is to follow up for wellness visit 3 of 3.

## 2023-08-08 ENCOUNTER — Encounter: Payer: Self-pay | Admitting: Nurse Practitioner

## 2023-08-12 ENCOUNTER — Other Ambulatory Visit: Payer: Self-pay | Admitting: Nurse Practitioner

## 2023-08-15 ENCOUNTER — Other Ambulatory Visit: Payer: Self-pay

## 2023-08-15 MED ORDER — URSODIOL 250 MG PO TABS
500.0000 mg | ORAL_TABLET | Freq: Every day | ORAL | 0 refills | Status: DC
  Filled 2023-08-15: qty 180, 90d supply, fill #0

## 2023-08-31 LAB — CBC AND DIFFERENTIAL
HCT: 43 (ref 41–53)
Hemoglobin: 14.3 (ref 13.5–17.5)
WBC: 6.2

## 2023-08-31 LAB — BASIC METABOLIC PANEL WITH GFR
BUN: 11 (ref 4–21)
CO2: 25 — AB (ref 13–22)
Chloride: 104 (ref 99–108)
Creatinine: 1 (ref 0.6–1.3)
Glucose: 102
Potassium: 4.6 meq/L (ref 3.5–5.1)
Sodium: 142 (ref 137–147)

## 2023-08-31 LAB — COMPREHENSIVE METABOLIC PANEL WITH GFR
Albumin: 4.2 (ref 3.5–5.0)
Calcium: 9.2 (ref 8.7–10.7)
Globulin: 2.4
eGFR: 82

## 2023-08-31 LAB — TSH: TSH: 0.73 (ref 0.41–5.90)

## 2023-08-31 LAB — HEPATIC FUNCTION PANEL
ALT: 24 U/L (ref 10–40)
AST: 22 (ref 14–40)
Alkaline Phosphatase: 79 (ref 25–125)

## 2023-08-31 LAB — HEMOGLOBIN A1C: Hemoglobin A1C: 6.1

## 2023-08-31 LAB — LIPID PANEL
Cholesterol: 114 (ref 0–200)
HDL: 48 (ref 35–70)
LDL Cholesterol: 54
Triglycerides: 54 (ref 40–160)

## 2023-08-31 LAB — CBC: RBC: 4.44 (ref 3.87–5.11)

## 2023-09-06 ENCOUNTER — Encounter: Payer: Commercial Managed Care - PPO | Attending: Nurse Practitioner | Admitting: Dietician

## 2023-09-06 ENCOUNTER — Encounter: Payer: Self-pay | Admitting: Dietician

## 2023-09-06 DIAGNOSIS — E669 Obesity, unspecified: Secondary | ICD-10-CM | POA: Insufficient documentation

## 2023-09-06 DIAGNOSIS — E1169 Type 2 diabetes mellitus with other specified complication: Secondary | ICD-10-CM | POA: Insufficient documentation

## 2023-09-06 NOTE — Progress Notes (Signed)
 Medical Nutrition Therapy  Appointment Start time:  1120  Appointment End time:  1140 Employee Wellness Visit 3 of 3 EID#: [id number]  Primary concerns today: Dietary Improvement  Referral diagnosis: E11.9 - T2DM, E78.00 - High Cholesterol, I10 - HTN Preferred learning style: No preference indicated Learning readiness: Ready   NUTRITION ASSESSMENT   Clinical Medical Hx: T2DM, HTN, HLD, R BKA, COVID-19 Medications: Atorvastatin, Metformin, Famotidine (PRN) Labs: (12/07/2022) Cholesterol - 122, LDL - 51 (10/05/2022) A1c - 5.9% Notable Signs/Symptoms: N/A   Lifestyle & Dietary Hx Pt reports no significant changes since last visit. Pt reports FBG is staying pretty stable, has seen a few lower FBG readings <110 mg/dL. Pt reports increasing water intake, having fewer peanuts when snacking at night, states they have been incorporating resistance bands but not much. Pt reports getting A1c, CMET, and PSA drawn last week, still awaiting results.    Estimated daily fluid intake: 48 oz Supplements: Vit C, Vit D,  Sleep: Sleeps well Stress / self-care: Low, Current average weekly physical activity: ADLs, Walks 3/4 mile most mornings, 1 1/2 miles on some Saturdays   24-Hr Dietary Recall First Meal: Coffee w/ Splenda, splash of half and half Snack: none Second Meal: Spinach dip, celery sticks Snack: none Third Meal: Broiled shrimp, salad, cabbage, mini Dr. Kathlene Paradise ZERO Snack: none Beverages: Water, coffee, Celsius    NUTRITION DIAGNOSIS  NB-1.1 Food and nutrition-related knowledge deficit As related to DM.  As evidenced by inconsistent carbohydrate intake.   NUTRITION INTERVENTION  Nutrition education (E-1) on the following topics:  Educated patient on the pathophysiology of diabetes. This includes why our bodies need circulating blood sugar, the relationship between insulin and blood sugar, and the results of insulin resistance and/or pancreatic insufficiency on the development of  diabetes. Educated patient on factors that contribute to elevation of blood sugars, such as stress, illness, injury,and food choices. Discussed the role that physical activity plays in lowering blood sugar. Educate patient on the three main macronutrients. Protein, fats, and carbohydrates. Discussed how each of these macronutrients affect blood sugar levels, especially carbohydrate, and the importance of eating a consistent amount of carbohydrate throughout the day. Educated patient on the role of dietary fats on insulin resistance.   Handouts Provided Include  Balanced Plate  Learning Style & Readiness for Change Teaching method utilized: Visual & Auditory  Demonstrated degree of understanding via: Teach Back  Barriers to learning/adherence to lifestyle change: None  Goals Established by Pt Keep up the great work!!! Continue to increase your resistance band exercises as often as you can. Aim for 15-20 minutes each time!! Continue to look for your fasting blood sugar numbers to gradually lower to a goal of under 100 mg/dL!   MONITORING & EVALUATION Labs, glycemic control PRN  Next Steps  Patient is to follow up PRN

## 2023-09-06 NOTE — Patient Instructions (Addendum)
 Keep up the great work!!!  Continue to increase your resistance band exercises as often as you can. Aim for 15-20 minutes each time!!  Continue to look for your fasting blood sugar numbers to gradually lower to a goal of under 100 mg/dL!

## 2023-09-10 ENCOUNTER — Other Ambulatory Visit: Payer: Self-pay | Admitting: Nurse Practitioner

## 2023-09-12 ENCOUNTER — Other Ambulatory Visit (HOSPITAL_COMMUNITY): Payer: Self-pay

## 2023-09-12 MED ORDER — METFORMIN HCL 500 MG PO TABS
500.0000 mg | ORAL_TABLET | Freq: Two times a day (BID) | ORAL | 1 refills | Status: DC
  Filled 2023-09-12 – 2023-09-13 (×2): qty 180, 90d supply, fill #0
  Filled 2023-12-18: qty 180, 90d supply, fill #1

## 2023-09-13 ENCOUNTER — Other Ambulatory Visit: Payer: Self-pay

## 2023-09-13 ENCOUNTER — Other Ambulatory Visit (HOSPITAL_COMMUNITY): Payer: Self-pay

## 2023-09-21 ENCOUNTER — Telehealth: Payer: Self-pay

## 2023-09-21 NOTE — Telephone Encounter (Signed)
 Called patient to schedule an appt. Patient stated he would call us  back when he had access to his schedule.

## 2023-09-26 DIAGNOSIS — Z89511 Acquired absence of right leg below knee: Secondary | ICD-10-CM | POA: Diagnosis not present

## 2023-09-28 ENCOUNTER — Encounter: Payer: Self-pay | Admitting: Nurse Practitioner

## 2023-09-29 ENCOUNTER — Other Ambulatory Visit: Payer: Self-pay

## 2023-09-29 ENCOUNTER — Other Ambulatory Visit (HOSPITAL_COMMUNITY): Payer: Self-pay

## 2023-09-30 ENCOUNTER — Other Ambulatory Visit: Payer: Self-pay

## 2023-09-30 MED ORDER — SHINGRIX 50 MCG/0.5ML IM SUSR
0.5000 mL | Freq: Once | INTRAMUSCULAR | 1 refills | Status: AC
  Filled 2023-09-30: qty 0.5, 1d supply, fill #0

## 2023-10-05 ENCOUNTER — Encounter: Payer: Self-pay | Admitting: Nurse Practitioner

## 2023-10-19 ENCOUNTER — Ambulatory Visit: Payer: Self-pay | Admitting: Nurse Practitioner

## 2023-10-19 NOTE — Progress Notes (Deleted)
 [place], CMA,acting as a Neurosurgeon for Susanna Epley, FNP.,have documented all relevant documentation on the behalf of Susanna Epley, FNP,as directed by  Susanna Epley, FNP while in the presence of Susanna Epley, FNP.  Subjective:  Patient ID: [name] , male    DOB: 27-Apr-1956 , 68 y.o.   MRN: [id number]  No chief complaint on file.   HPI  HPI   Past Medical History:  Diagnosis Date   Acute ischemic left MCA stroke (HCC) 05/02/2019   AKI (acute kidney injury) (HCC) 04/09/2019   Community acquired pneumonia 04/09/2019   Diabetes mellitus without complication (HCC)    GERD (gastroesophageal reflux disease)    Hx of right BKA (HCC)    uses prosthetic    Hyperglycemia 04/09/2019   Hyperlipidemia    Hypertension    Left leg DVT (HCC) 09/22/2019   Liver disorder    tx with ursodiol     Lobar pneumonia (HCC)    Pneumonia due to COVID-19 virus 04/09/2019   Severe sepsis (HCC) 04/09/2019   Transaminitis 04/09/2019     Family History  Problem Relation Age of Onset   Colon polyps Father    Colon cancer Neg Hx    Rectal cancer Neg Hx    Stomach cancer Neg Hx      Current Outpatient Medications:    acetaminophen  (TYLENOL ) 500 MG tablet, Take 500 mg by mouth every 6 (six) hours as needed for mild pain or fever. , Disp: , Rfl:    Alcohol  Swabs (ALCOHOL  WIPES) 70 % PADS, Use to wipe skin to check CBG, Disp: 100 each, Rfl: 0   Ascorbic Acid  (VITAMIN C ) 1000 MG tablet, Take 1,000 mg by mouth daily., Disp: , Rfl:    aspirin  81 MG chewable tablet, Chew 81 mg by mouth daily., Disp: , Rfl:    atorvastatin  (LIPITOR) 40 MG tablet, Take 1 tablet (40 mg total) by mouth daily., Disp: 90 tablet, Rfl: 1   blood glucose meter kit and supplies KIT, Dispense based on patient and insurance preference. Use up to four times daily as directed. (FOR ICD-9 250.00, 250.01)., Disp: 1 each, Rfl: 0   Blood Pressure Monitor DEVI, Use to check blood pressure daily; I10.0 Hypertension, Disp: 1 each, Rfl: 0    famotidine  (PEPCID ) 20 MG tablet, Take 40 mg by mouth as needed. , Disp: , Rfl:    metFORMIN  (GLUCOPHAGE ) 500 MG tablet, Take 1 tablet (500 mg total) by mouth 2 (two) times daily with a meal., Disp: 180 tablet, Rfl: 1   Omega-3 Fatty Acids (FISH OIL PO), Take by mouth daily., Disp: , Rfl:    tamsulosin  (FLOMAX ) 0.4 MG CAPS capsule, Take 1 capsule (0.4 mg total) by mouth daily after supper., Disp: 90 capsule, Rfl: 0   ursodiol  (ACTIGALL ) 250 MG tablet, Take 2 tablets (500 mg total) by mouth daily., Disp: 180 tablet, Rfl: 0   valsartan -hydrochlorothiazide  (DIOVAN -HCT) 320-25 MG tablet, Take 1 tablet by mouth daily., Disp: 90 tablet, Rfl: 1   valsartan -hydrochlorothiazide  (DIOVAN -HCT) 320-25 MG tablet, Take 1 tablet by mouth daily., Disp: 90 tablet, Rfl: 2   VITAMIN D PO, Take 5,000 Units by mouth daily., Disp: , Rfl:    No Known Allergies   Review of Systems   There were no vitals filed for this visit. There is no height or weight on file to calculate BMI.  Wt Readings from Last 3 Encounters:  06/28/23 202 lb 6.4 oz (91.8 kg)  12/07/22 202 lb 6.4 oz (91.8 kg)  10/20/22 203 lb 12.8 oz (92.4 kg)    The ASCVD Risk score (Arnett DK, et al., 2019) failed to calculate for the following reasons:   Risk score cannot be calculated because patient has a medical history suggesting prior/existing ASCVD  Objective:  Physical Exam      Assessment And Plan:  Benign hypertension with CKD (chronic kidney disease), stage II  Mixed hyperlipidemia  Type 2 diabetes mellitus with obesity (HCC)    No follow-ups on file.  Patient was given opportunity to ask questions. Patient verbalized understanding of the plan and was able to repeat key elements of the plan. All questions were answered to their satisfaction.    Inge Mangle, FNP, have reviewed all documentation for this visit. The documentation on 10/19/23 for the exam, diagnosis, procedures, and orders are all accurate and complete.   IF YOU  HAVE BEEN REFERRED TO A SPECIALIST, IT MAY TAKE 1-2 WEEKS TO SCHEDULE/PROCESS THE REFERRAL. IF YOU HAVE NOT HEARD FROM US /SPECIALIST IN TWO WEEKS, PLEASE GIVE US  A CALL AT [phone].

## 2023-10-24 ENCOUNTER — Other Ambulatory Visit: Payer: Self-pay | Admitting: Nurse Practitioner

## 2023-10-24 MED ORDER — TAMSULOSIN HCL 0.4 MG PO CAPS
0.4000 mg | ORAL_CAPSULE | Freq: Every day | ORAL | 0 refills | Status: DC
  Filled 2023-10-24: qty 90, 90d supply, fill #0

## 2023-10-25 ENCOUNTER — Other Ambulatory Visit (HOSPITAL_COMMUNITY): Payer: Self-pay

## 2023-10-25 ENCOUNTER — Other Ambulatory Visit: Payer: Self-pay

## 2023-11-21 ENCOUNTER — Other Ambulatory Visit: Payer: Self-pay | Admitting: Nurse Practitioner

## 2023-11-21 ENCOUNTER — Other Ambulatory Visit (HOSPITAL_COMMUNITY): Payer: Self-pay

## 2023-11-21 ENCOUNTER — Other Ambulatory Visit: Payer: Self-pay

## 2023-11-21 ENCOUNTER — Encounter: Payer: Self-pay | Admitting: Nurse Practitioner

## 2023-11-21 MED ORDER — VALSARTAN-HYDROCHLOROTHIAZIDE 320-25 MG PO TABS
1.0000 | ORAL_TABLET | Freq: Every day | ORAL | 2 refills | Status: AC
  Filled 2023-11-21: qty 90, 90d supply, fill #0
  Filled 2024-03-12 – 2024-03-22 (×2): qty 90, 90d supply, fill #1
  Filled 2024-06-18: qty 90, 90d supply, fill #2

## 2023-12-08 ENCOUNTER — Other Ambulatory Visit (HOSPITAL_COMMUNITY): Payer: Self-pay

## 2023-12-08 ENCOUNTER — Encounter: Payer: Self-pay | Admitting: Nurse Practitioner

## 2023-12-08 ENCOUNTER — Ambulatory Visit (INDEPENDENT_AMBULATORY_CARE_PROVIDER_SITE_OTHER): Payer: Commercial Managed Care - PPO | Admitting: Nurse Practitioner

## 2023-12-08 ENCOUNTER — Other Ambulatory Visit: Payer: Self-pay

## 2023-12-08 VITALS — BP 120/80 | HR 69 | Temp 98.6°F | Ht 66.0 in | Wt 204.6 lb

## 2023-12-08 DIAGNOSIS — M25511 Pain in right shoulder: Secondary | ICD-10-CM

## 2023-12-08 DIAGNOSIS — Z125 Encounter for screening for malignant neoplasm of prostate: Secondary | ICD-10-CM | POA: Diagnosis not present

## 2023-12-08 DIAGNOSIS — Z6833 Body mass index (BMI) 33.0-33.9, adult: Secondary | ICD-10-CM

## 2023-12-08 DIAGNOSIS — I129 Hypertensive chronic kidney disease with stage 1 through stage 4 chronic kidney disease, or unspecified chronic kidney disease: Secondary | ICD-10-CM

## 2023-12-08 DIAGNOSIS — E782 Mixed hyperlipidemia: Secondary | ICD-10-CM | POA: Diagnosis not present

## 2023-12-08 DIAGNOSIS — Z0000 Encounter for general adult medical examination without abnormal findings: Secondary | ICD-10-CM

## 2023-12-08 DIAGNOSIS — R81 Glycosuria: Secondary | ICD-10-CM | POA: Diagnosis not present

## 2023-12-08 DIAGNOSIS — E66811 Obesity, class 1: Secondary | ICD-10-CM

## 2023-12-08 DIAGNOSIS — E1122 Type 2 diabetes mellitus with diabetic chronic kidney disease: Secondary | ICD-10-CM

## 2023-12-08 DIAGNOSIS — Z79899 Other long term (current) drug therapy: Secondary | ICD-10-CM | POA: Diagnosis not present

## 2023-12-08 DIAGNOSIS — E1169 Type 2 diabetes mellitus with other specified complication: Secondary | ICD-10-CM | POA: Diagnosis not present

## 2023-12-08 DIAGNOSIS — E6609 Other obesity due to excess calories: Secondary | ICD-10-CM | POA: Diagnosis not present

## 2023-12-08 DIAGNOSIS — N182 Chronic kidney disease, stage 2 (mild): Secondary | ICD-10-CM

## 2023-12-08 DIAGNOSIS — E669 Obesity, unspecified: Secondary | ICD-10-CM | POA: Diagnosis not present

## 2023-12-08 LAB — POCT URINALYSIS DIP (CLINITEK)
Bilirubin, UA: NEGATIVE
Glucose, UA: 500 mg/dL — AB
Ketones, POC UA: NEGATIVE mg/dL
Leukocytes, UA: NEGATIVE
Nitrite, UA: NEGATIVE
POC PROTEIN,UA: NEGATIVE
Spec Grav, UA: 1.015 (ref 1.010–1.025)
Urobilinogen, UA: 0.2 U/dL
pH, UA: 6.5 (ref 5.0–8.0)

## 2023-12-08 MED ORDER — ATORVASTATIN CALCIUM 40 MG PO TABS
40.0000 mg | ORAL_TABLET | Freq: Every day | ORAL | 1 refills | Status: AC
  Filled 2023-12-08 – 2024-01-02 (×2): qty 90, 90d supply, fill #0
  Filled 2024-06-06: qty 90, 90d supply, fill #1

## 2023-12-08 NOTE — Assessment & Plan Note (Signed)
 Has glucose in urine, denies urinary frequency will check A1c.

## 2023-12-08 NOTE — Progress Notes (Signed)
 LILLETTE Kristeen JINNY Gladis, CMA,acting as a Neurosurgeon for [name] Ada, FNP.,have documented all relevant documentation on the behalf of [name] Ada, FNP,as directed by  [name] Ada, FNP while in the presence of [name] Ada, FNP.  Subjective:   Patient ID: [name] , male    DOB: [birth year] , 68 y.o.   MRN: [id number]  Chief Complaint  Patient presents with   Annual Exam    Patient presents today for HM, Patient reports compliance with medication. Patient denies any chest pain, SOB, or headaches. Patient has no concerns today.     Discussed the use of AI scribe software for clinical note transcription with the patient, who gave verbal consent to proceed.  History of Present Illness [name] is a 69 year old male who presents for an annual physical exam.  He engages in regular exercise, walking three to four times a week, with durations of 20 minutes on weekdays and 40 minutes on Saturdays. He follows a mostly low-carb, low-sodium diet.  He has a history of a right shoulder issue, initially occurring 20 years ago after throwing something into a dumpster, resulting in a 'pop' sensation. Recently, he has experienced clicking and discomfort with overuse, particularly during external and internal rotation. He manages occasional pain with Tylenol  and ibuprofen, though he prefers to avoid ibuprofen due to past health issues.  He has a history of severe COVID-19 in 2020, which required ICU admission and resulted in acute renal failure and a blood clot. He was hospitalized for a month and experienced significant breathing problems.  He is currently taking atorvastatin  and valsartan , and he monitors his blood pressure at home. He also takes metformin  for diabetes management. No swelling in his feet, legs, or hands, and no issues with urination frequency, increased thirst, or hunger.  He has seen a podiatrist in February and an eye doctor recently, though he cannot recall the eye doctor's name. He  plans to provide the eye doctor's information to obtain his eye exam report.    He plans to retire in September 2025 and plans to switch to medicare. He reports he has seen the ophthalmologist within the last year.      Past Medical History:  Diagnosis Date   Acute ischemic left MCA stroke (HCC) 05/02/2019   AKI (acute kidney injury) (HCC) 04/09/2019   Community acquired pneumonia 04/09/2019   Diabetes mellitus without complication (HCC)    GERD (gastroesophageal reflux disease)    Hx of right BKA (HCC)    uses prosthetic    Hyperglycemia 04/09/2019   Hyperlipidemia    Hypertension    Left leg DVT (HCC) 09/22/2019   Liver disorder    tx with ursodiol     Lobar pneumonia (HCC)    Pneumonia due to COVID-19 virus 04/09/2019   Severe sepsis (HCC) 04/09/2019   Transaminitis 04/09/2019   Wound of right leg 10/20/2022     Family History  Problem Relation Age of Onset   Colon polyps Father    Colon cancer Neg Hx    Rectal cancer Neg Hx    Stomach cancer Neg Hx      Current Outpatient Medications:    acetaminophen  (TYLENOL ) 500 MG tablet, Take 500 mg by mouth every 6 (six) hours as needed for mild pain or fever. , Disp: , Rfl:    Alcohol  Swabs (ALCOHOL  WIPES) 70 % PADS, Use to wipe skin to check CBG, Disp: 100 each, Rfl: 0   Ascorbic Acid  (VITAMIN C ) 1000 MG tablet,  Take 1,000 mg by mouth daily., Disp: , Rfl:    aspirin  81 MG chewable tablet, Chew 81 mg by mouth daily., Disp: , Rfl:    blood glucose meter kit and supplies KIT, Dispense based on patient and insurance preference. Use up to four times daily as directed. (FOR ICD-9 250.00, 250.01)., Disp: 1 each, Rfl: 0   Blood Pressure Monitor DEVI, Use to check blood pressure daily; I10.0 Hypertension, Disp: 1 each, Rfl: 0   famotidine  (PEPCID ) 20 MG tablet, Take 40 mg by mouth as needed. , Disp: , Rfl:    metFORMIN  (GLUCOPHAGE ) 500 MG tablet, Take 1 tablet (500 mg total) by mouth 2 (two) times daily with a meal., Disp: 180 tablet,  Rfl: 1   Omega-3 Fatty Acids (FISH OIL PO), Take by mouth daily., Disp: , Rfl:    tamsulosin  (FLOMAX ) 0.4 MG CAPS capsule, Take 1 capsule (0.4 mg total) by mouth daily after supper., Disp: 90 capsule, Rfl: 0   ursodiol  (ACTIGALL ) 250 MG tablet, Take 2 tablets (500 mg total) by mouth daily., Disp: 180 tablet, Rfl: 0   valsartan -hydrochlorothiazide  (DIOVAN -HCT) 320-25 MG tablet, Take 1 tablet by mouth daily., Disp: 90 tablet, Rfl: 2   VITAMIN D PO, Take 5,000 Units by mouth daily., Disp: , Rfl:    atorvastatin  (LIPITOR) 40 MG tablet, Take 1 tablet (40 mg total) by mouth daily., Disp: 90 tablet, Rfl: 1   No Known Allergies   Men's preventive visit. Patient Health Questionnaire (PHQ-2) is  Flowsheet Row Nutrition from 07/06/2023 in [facility] at [facility] Total Score 0  Patient is on a Regular diet.Exercise with walking 3-4 days a week approximately 30 minutes a day. Marital status: Married. Relevant history for alcohol  use is:  Social History   Substance and Sexual Activity  Alcohol  Use No  . Relevant history for tobacco use is:  Social History   Tobacco Use  Smoking Status Never  Smokeless Tobacco Never  .   Review of Systems  Constitutional: Negative.   HENT: Negative.    Eyes: Negative.   Respiratory: Negative.    Cardiovascular: Negative.   Gastrointestinal: Negative.   Endocrine: Negative.   Genitourinary: Negative.   Musculoskeletal: Negative.        Right shoulder pain now having clicks and feels like rotation is off. Not affecting his daily lifestyle. Concerned about a partial tear has not seen an orthopedic lately. Occasional tylenol .   Skin: Negative.   Allergic/Immunologic: Negative.   Neurological: Negative.   Hematological: Negative.   Psychiatric/Behavioral: Negative.       Today's Vitals   12/08/23 0915  BP: 120/80  Pulse: 69  Temp: 98.6 F (37 C)  TempSrc: Oral  Weight: 204 lb 9.6 oz (92.8 kg)   Height: 5' 6 (1.676 m)  PainSc: 0-No pain   Body mass index is 33.02 kg/m.  Wt Readings from Last 3 Encounters:  12/08/23 204 lb 9.6 oz (92.8 kg)  06/28/23 202 lb 6.4 oz (91.8 kg)  12/07/22 202 lb 6.4 oz (91.8 kg)    Objective:  Physical Exam Vitals and nursing note reviewed.  Constitutional:      General: He is not in acute distress.    Appearance: Normal appearance. He is obese.  HENT:     Head: Normocephalic and atraumatic.     Right Ear: Tympanic membrane, ear canal and external ear normal. There is no impacted cerumen.     Left Ear: Tympanic membrane, ear  canal and external ear normal. There is no impacted cerumen.     Nose: Nose normal.     Mouth/Throat:     Mouth: Mucous membranes are moist.  Cardiovascular:     Rate and Rhythm: Normal rate and regular rhythm.     Pulses: Normal pulses.     Heart sounds: Normal heart sounds. No murmur heard. Pulmonary:     Effort: Pulmonary effort is normal. No respiratory distress.     Breath sounds: Normal breath sounds. No wheezing.  Abdominal:     General: Bowel sounds are normal. There is no distension.     Palpations: Abdomen is soft. There is no mass.     Tenderness: There is no abdominal tenderness.  Musculoskeletal:        General: No swelling or deformity.     Right shoulder: Crepitus present. No swelling, deformity or tenderness. Normal range of motion. Normal strength.     Left shoulder: Normal.     Cervical back: Normal range of motion and neck supple. No rigidity.     Comments: Right AKA with prosthesis Audible click with internal adduction  Skin:    General: Skin is warm and dry.     Capillary Refill: Capillary refill takes less than 2 seconds.  Neurological:     General: No focal deficit present.     Mental Status: He is alert and oriented to person, place, and time.     Cranial Nerves: No cranial nerve deficit.     Motor: No weakness.     Gait: Gait abnormal (slight limp related to prosthesis).   Psychiatric:        Mood and Affect: Mood normal.        Behavior: Behavior normal.        Thought Content: Thought content normal.        Judgment: Judgment normal.      Assessment And Plan:    Encounter for annual health examination  Benign hypertension with CKD (chronic kidney disease), stage II Assessment & Plan: Managed with valsartan -hydrochlorothiazide . Recent duplicate order corrected. EKG done with NSR HR 60 - Refill valsartan -hydrochlorothiazide  prescription.  Orders: -     EKG 12-Lead -     POCT URINALYSIS DIP (CLINITEK) -     Microalbumin / creatinine urine ratio -     CMP14+EGFR  Mixed hyperlipidemia -     CMP14+EGFR -     Lipid panel -     Atorvastatin  Calcium ; Take 1 tablet (40 mg total) by mouth daily.  Dispense: 90 tablet; Refill: 1  Type 2 diabetes mellitus with obesity (HCC) Assessment & Plan: Managed with metformin . Recent glycosuria noted. Last A1c was 6.1% in April, up from 5.9% the previous year. - Check A1c level to assess current glycemic control. - Continue metformin  therapy.  Orders: -     CMP14+EGFR -     Hemoglobin A1c  Class 1 obesity due to excess calories without serious comorbidity with body mass index (BMI) of 33.0 to 33.9 in adult Assessment & Plan: He is encouraged to strive for BMI less than 30 to decrease cardiac risk. Advised to aim for at least 150 minutes of exercise per week.    Other long term (current) drug therapy -     CBC with Differential/Platelet  Glucosuria Assessment & Plan: Has glucose in urine, denies urinary frequency will check A1c.    Acute pain of right shoulder Assessment & Plan: Intermittent pain with clicking, possibly related to old injury. Not  significantly affecting daily activities. - Consider orthopedic referral if symptoms worsen within a month. Re-evaluate in one month if symptoms persist.   Encounter for prostate cancer screening -     PSA  Encounter for health maintenance  examination Assessment & Plan: Behavior modifications discussed and diet history reviewed.   Recommend intake of daily multivitamin, Vitamin D, and calcium .  Colonoscopy (up to date) for preventive screenings, as well as recommend immunizations that include influenza Engages in regular exercise and follows a low-carb, low-sodium diet.  Plans to retire and switch to Medicare. Schedule Medicare annual wellness visit for January or           February after retirement.      Return for Welcome to Medicare in 6 months and DM. Patient was given opportunity to ask questions. Patient verbalized understanding of the plan and was able to repeat key elements of the plan. All questions were answered to their satisfaction.   [name] Ada, FNP  I, [name] Ada, FNP, have reviewed all documentation for this visit. The documentation on 12/08/23 for the exam, diagnosis, procedures, and orders are all accurate and complete.

## 2023-12-08 NOTE — Assessment & Plan Note (Signed)
 Managed with metformin . Recent glycosuria noted. Last A1c was 6.1% in April, up from 5.9% the previous year. - Check A1c level to assess current glycemic control. - Continue metformin  therapy.

## 2023-12-08 NOTE — Assessment & Plan Note (Signed)
 Intermittent pain with clicking, possibly related to old injury. Not significantly affecting daily activities. - Consider orthopedic referral if symptoms worsen within a month. Re-evaluate in one month if symptoms persist.

## 2023-12-08 NOTE — Assessment & Plan Note (Signed)
 Managed with valsartan -hydrochlorothiazide . Recent duplicate order corrected. EKG done with NSR HR 60 - Refill valsartan -hydrochlorothiazide  prescription.

## 2023-12-08 NOTE — Assessment & Plan Note (Signed)
 He is encouraged to strive for BMI less than 30 to decrease cardiac risk. Advised to aim for at least 150 minutes of exercise per week.

## 2023-12-08 NOTE — Assessment & Plan Note (Signed)
 Behavior modifications discussed and diet history reviewed.   Recommend intake of daily multivitamin, Vitamin D, and calcium .  Colonoscopy (up to date) for preventive screenings, as well as recommend immunizations that include influenza Engages in regular exercise and follows a low-carb, low-sodium diet.  Plans to retire and switch to Medicare. Schedule Medicare annual wellness visit for January or           February after retirement.

## 2023-12-08 NOTE — Assessment & Plan Note (Signed)
 Will

## 2023-12-08 NOTE — Patient Instructions (Signed)
 Health Maintenance  Topic Date Due   Eye exam for diabetics  02/26/2023   Yearly kidney health urinalysis for diabetes  12/07/2023   COVID-19 Vaccine (5 - 2024-25 season) 12/24/2023*   Colon Cancer Screening  04/20/2024*   Flu Shot  12/23/2023   Hemoglobin A1C  03/01/2024   Complete foot exam   06/27/2024   Yearly kidney function blood test for diabetes  08/30/2024   DTaP/Tdap/Td vaccine (2 - Td or Tdap) 01/14/2031   Pneumococcal Vaccine for age over 83  Completed   Hepatitis C Screening  Completed   Zoster (Shingles) Vaccine  Completed   Hepatitis B Vaccine  Aged Out   HPV Vaccine  Aged Out   Meningitis B Vaccine  Aged Out  *Topic was postponed. The date shown is not the original due date.   Congratulations again on your retirement!

## 2023-12-11 LAB — MICROALBUMIN / CREATININE URINE RATIO
Creatinine, Urine: 68 mg/dL
Microalb/Creat Ratio: 7 mg/g{creat} (ref 0–29)
Microalbumin, Urine: 4.5 ug/mL

## 2023-12-11 LAB — LIPID PANEL
Chol/HDL Ratio: 2.4 ratio (ref 0.0–5.0)
Cholesterol, Total: 127 mg/dL (ref 100–199)
HDL: 54 mg/dL (ref >39)
LDL Chol Calc (NIH): 58 mg/dL (ref 0–99)
Triglycerides: 74 mg/dL (ref 0–149)
VLDL Cholesterol Cal: 15 mg/dL (ref 5–40)

## 2023-12-11 LAB — CBC WITH DIFFERENTIAL/PLATELET
Basophils Absolute: 0.1 x10E3/uL (ref 0.0–0.2)
Basos: 1 %
EOS (ABSOLUTE): 0.2 x10E3/uL (ref 0.0–0.4)
Eos: 3 %
Hematocrit: 44.7 % (ref 37.5–51.0)
Hemoglobin: 14.7 g/dL (ref 13.0–17.7)
Immature Grans (Abs): 0 x10E3/uL (ref 0.0–0.1)
Immature Granulocytes: 0 %
Lymphocytes Absolute: 1.8 x10E3/uL (ref 0.7–3.1)
Lymphs: 29 %
MCH: 31.8 pg (ref 26.6–33.0)
MCHC: 32.9 g/dL (ref 31.5–35.7)
MCV: 97 fL (ref 79–97)
Monocytes Absolute: 0.6 x10E3/uL (ref 0.1–0.9)
Monocytes: 10 %
Neutrophils Absolute: 3.5 x10E3/uL (ref 1.4–7.0)
Neutrophils: 57 %
Platelets: 200 x10E3/uL (ref 150–450)
RBC: 4.62 x10E6/uL (ref 4.14–5.80)
RDW: 12.7 % (ref 11.6–15.4)
WBC: 6.1 x10E3/uL (ref 3.4–10.8)

## 2023-12-11 LAB — CMP14+EGFR
ALT: 18 IU/L (ref 0–44)
AST: 22 IU/L (ref 0–40)
Albumin: 4.4 g/dL (ref 3.9–4.9)
Alkaline Phosphatase: 66 IU/L (ref 44–121)
BUN/Creatinine Ratio: 12 (ref 10–24)
BUN: 14 mg/dL (ref 8–27)
Bilirubin Total: 0.9 mg/dL (ref 0.0–1.2)
CO2: 22 mmol/L (ref 20–29)
Calcium: 9.6 mg/dL (ref 8.6–10.2)
Chloride: 101 mmol/L (ref 96–106)
Creatinine, Ser: 1.17 mg/dL (ref 0.76–1.27)
Globulin, Total: 2.8 g/dL (ref 1.5–4.5)
Glucose: 93 mg/dL (ref 70–99)
Potassium: 4.2 mmol/L (ref 3.5–5.2)
Sodium: 141 mmol/L (ref 134–144)
Total Protein: 7.2 g/dL (ref 6.0–8.5)
eGFR: 68 mL/min/1.73 (ref >59)

## 2023-12-11 LAB — PSA: Prostate Specific Ag, Serum: 0.9 ng/mL (ref 0.0–4.0)

## 2023-12-11 LAB — HEMOGLOBIN A1C
Est. average glucose Bld gHb Est-mCnc: 120 mg/dL
Hgb A1c MFr Bld: 5.8 % — ABNORMAL HIGH (ref 4.8–5.6)

## 2023-12-18 ENCOUNTER — Other Ambulatory Visit: Payer: Self-pay | Admitting: Nurse Practitioner

## 2023-12-19 ENCOUNTER — Other Ambulatory Visit (HOSPITAL_COMMUNITY): Payer: Self-pay

## 2023-12-19 ENCOUNTER — Other Ambulatory Visit: Payer: Self-pay

## 2023-12-19 MED ORDER — URSODIOL 250 MG PO TABS
500.0000 mg | ORAL_TABLET | Freq: Every day | ORAL | 0 refills | Status: DC
  Filled 2023-12-19: qty 180, 90d supply, fill #0

## 2023-12-22 ENCOUNTER — Encounter: Payer: Self-pay | Admitting: Nurse Practitioner

## 2023-12-24 DIAGNOSIS — M25511 Pain in right shoulder: Secondary | ICD-10-CM | POA: Diagnosis not present

## 2023-12-24 DIAGNOSIS — M009 Pyogenic arthritis, unspecified: Secondary | ICD-10-CM | POA: Diagnosis not present

## 2023-12-26 ENCOUNTER — Other Ambulatory Visit: Payer: Self-pay | Admitting: Nurse Practitioner

## 2023-12-26 DIAGNOSIS — M25511 Pain in right shoulder: Secondary | ICD-10-CM

## 2023-12-29 DIAGNOSIS — M25511 Pain in right shoulder: Secondary | ICD-10-CM | POA: Diagnosis not present

## 2023-12-29 DIAGNOSIS — T8740 Infection of amputation stump, unspecified extremity: Secondary | ICD-10-CM | POA: Diagnosis not present

## 2024-01-02 ENCOUNTER — Other Ambulatory Visit: Payer: Self-pay

## 2024-01-02 ENCOUNTER — Other Ambulatory Visit (HOSPITAL_BASED_OUTPATIENT_CLINIC_OR_DEPARTMENT_OTHER): Payer: Self-pay

## 2024-01-12 DIAGNOSIS — M25511 Pain in right shoulder: Secondary | ICD-10-CM | POA: Diagnosis not present

## 2024-01-12 DIAGNOSIS — M75101 Unspecified rotator cuff tear or rupture of right shoulder, not specified as traumatic: Secondary | ICD-10-CM | POA: Diagnosis not present

## 2024-01-12 DIAGNOSIS — M12811 Other specific arthropathies, not elsewhere classified, right shoulder: Secondary | ICD-10-CM | POA: Diagnosis not present

## 2024-01-12 DIAGNOSIS — T8743 Infection of amputation stump, right lower extremity: Secondary | ICD-10-CM | POA: Diagnosis not present

## 2024-01-20 LAB — HM DIABETES EYE EXAM

## 2024-02-23 ENCOUNTER — Other Ambulatory Visit: Payer: Self-pay | Admitting: Nurse Practitioner

## 2024-02-24 ENCOUNTER — Other Ambulatory Visit (HOSPITAL_COMMUNITY): Payer: Self-pay

## 2024-02-25 ENCOUNTER — Other Ambulatory Visit (HOSPITAL_COMMUNITY): Payer: Self-pay

## 2024-02-28 ENCOUNTER — Other Ambulatory Visit (HOSPITAL_COMMUNITY): Payer: Self-pay

## 2024-02-28 ENCOUNTER — Other Ambulatory Visit: Payer: Self-pay

## 2024-02-28 MED ORDER — TAMSULOSIN HCL 0.4 MG PO CAPS
0.4000 mg | ORAL_CAPSULE | Freq: Every day | ORAL | 0 refills | Status: DC
  Filled 2024-02-28 – 2024-03-12 (×2): qty 90, 90d supply, fill #0

## 2024-02-29 ENCOUNTER — Other Ambulatory Visit: Payer: Self-pay

## 2024-02-29 ENCOUNTER — Other Ambulatory Visit (HOSPITAL_COMMUNITY): Payer: Self-pay

## 2024-02-29 ENCOUNTER — Encounter: Payer: Self-pay | Admitting: Pharmacist

## 2024-03-05 ENCOUNTER — Other Ambulatory Visit: Payer: Self-pay

## 2024-03-06 ENCOUNTER — Other Ambulatory Visit (HOSPITAL_COMMUNITY): Payer: Self-pay

## 2024-03-12 ENCOUNTER — Other Ambulatory Visit: Payer: Self-pay

## 2024-03-12 ENCOUNTER — Other Ambulatory Visit: Payer: Self-pay | Admitting: Nurse Practitioner

## 2024-03-12 ENCOUNTER — Other Ambulatory Visit (HOSPITAL_COMMUNITY): Payer: Self-pay

## 2024-03-13 ENCOUNTER — Other Ambulatory Visit: Payer: Self-pay

## 2024-03-13 ENCOUNTER — Other Ambulatory Visit (HOSPITAL_COMMUNITY): Payer: Self-pay

## 2024-03-13 MED ORDER — METFORMIN HCL 500 MG PO TABS
500.0000 mg | ORAL_TABLET | Freq: Two times a day (BID) | ORAL | 1 refills | Status: AC
  Filled 2024-03-13 – 2024-03-22 (×2): qty 180, 90d supply, fill #0
  Filled 2024-06-18: qty 180, 90d supply, fill #1

## 2024-03-22 ENCOUNTER — Other Ambulatory Visit (HOSPITAL_COMMUNITY): Payer: Self-pay

## 2024-03-22 ENCOUNTER — Other Ambulatory Visit: Payer: Self-pay

## 2024-04-09 ENCOUNTER — Other Ambulatory Visit (HOSPITAL_COMMUNITY): Payer: Self-pay

## 2024-04-09 ENCOUNTER — Other Ambulatory Visit: Payer: Self-pay | Admitting: Nurse Practitioner

## 2024-04-10 ENCOUNTER — Other Ambulatory Visit (HOSPITAL_COMMUNITY): Payer: Self-pay

## 2024-04-10 MED ORDER — URSODIOL 250 MG PO TABS
500.0000 mg | ORAL_TABLET | Freq: Every day | ORAL | 0 refills | Status: AC
  Filled 2024-04-10: qty 60, 30d supply, fill #0
  Filled 2024-06-06: qty 60, 30d supply, fill #1

## 2024-04-12 ENCOUNTER — Other Ambulatory Visit (HOSPITAL_COMMUNITY): Payer: Self-pay

## 2024-04-12 ENCOUNTER — Other Ambulatory Visit: Payer: Self-pay

## 2024-04-30 ENCOUNTER — Encounter (HOSPITAL_BASED_OUTPATIENT_CLINIC_OR_DEPARTMENT_OTHER): Payer: Self-pay | Admitting: Internal Medicine

## 2024-04-30 DIAGNOSIS — L97814 Non-pressure chronic ulcer of other part of right lower leg with necrosis of bone: Secondary | ICD-10-CM | POA: Insufficient documentation

## 2024-04-30 DIAGNOSIS — E11622 Type 2 diabetes mellitus with other skin ulcer: Secondary | ICD-10-CM | POA: Insufficient documentation

## 2024-04-30 DIAGNOSIS — Z89511 Acquired absence of right leg below knee: Secondary | ICD-10-CM | POA: Insufficient documentation

## 2024-05-01 ENCOUNTER — Other Ambulatory Visit (HOSPITAL_BASED_OUTPATIENT_CLINIC_OR_DEPARTMENT_OTHER): Payer: Self-pay | Admitting: Internal Medicine

## 2024-05-01 ENCOUNTER — Ambulatory Visit (HOSPITAL_BASED_OUTPATIENT_CLINIC_OR_DEPARTMENT_OTHER)
Admission: RE | Admit: 2024-05-01 | Discharge: 2024-05-01 | Disposition: A | Payer: Self-pay | Source: Ambulatory Visit | Attending: Internal Medicine | Admitting: Internal Medicine

## 2024-05-01 DIAGNOSIS — M869 Osteomyelitis, unspecified: Secondary | ICD-10-CM

## 2024-05-07 ENCOUNTER — Encounter (HOSPITAL_BASED_OUTPATIENT_CLINIC_OR_DEPARTMENT_OTHER): Payer: Self-pay | Admitting: General Surgery

## 2024-05-07 ENCOUNTER — Encounter: Payer: Self-pay | Admitting: Internal Medicine

## 2024-05-14 ENCOUNTER — Encounter (HOSPITAL_BASED_OUTPATIENT_CLINIC_OR_DEPARTMENT_OTHER): Payer: Self-pay | Admitting: General Surgery

## 2024-05-22 ENCOUNTER — Encounter (HOSPITAL_BASED_OUTPATIENT_CLINIC_OR_DEPARTMENT_OTHER): Payer: Self-pay | Admitting: General Surgery

## 2024-05-30 ENCOUNTER — Other Ambulatory Visit (HOSPITAL_COMMUNITY): Payer: Self-pay

## 2024-06-01 ENCOUNTER — Encounter (HOSPITAL_BASED_OUTPATIENT_CLINIC_OR_DEPARTMENT_OTHER): Payer: Self-pay | Attending: General Surgery | Admitting: General Surgery

## 2024-06-01 DIAGNOSIS — E11622 Type 2 diabetes mellitus with other skin ulcer: Secondary | ICD-10-CM | POA: Diagnosis present

## 2024-06-01 DIAGNOSIS — Z89511 Acquired absence of right leg below knee: Secondary | ICD-10-CM | POA: Insufficient documentation

## 2024-06-01 DIAGNOSIS — L97814 Non-pressure chronic ulcer of other part of right lower leg with necrosis of bone: Secondary | ICD-10-CM | POA: Diagnosis not present

## 2024-06-06 ENCOUNTER — Other Ambulatory Visit (HOSPITAL_COMMUNITY): Payer: Self-pay

## 2024-06-06 ENCOUNTER — Encounter (HOSPITAL_BASED_OUTPATIENT_CLINIC_OR_DEPARTMENT_OTHER): Payer: Self-pay | Admitting: General Surgery

## 2024-06-13 ENCOUNTER — Other Ambulatory Visit (HOSPITAL_COMMUNITY): Payer: Self-pay | Admitting: General Surgery

## 2024-06-13 ENCOUNTER — Encounter (HOSPITAL_BASED_OUTPATIENT_CLINIC_OR_DEPARTMENT_OTHER): Payer: Self-pay | Admitting: General Surgery

## 2024-06-13 DIAGNOSIS — E11622 Type 2 diabetes mellitus with other skin ulcer: Secondary | ICD-10-CM | POA: Diagnosis not present

## 2024-06-13 DIAGNOSIS — L97814 Non-pressure chronic ulcer of other part of right lower leg with necrosis of bone: Secondary | ICD-10-CM

## 2024-06-18 ENCOUNTER — Other Ambulatory Visit: Payer: Self-pay | Admitting: Nurse Practitioner

## 2024-06-18 ENCOUNTER — Other Ambulatory Visit (HOSPITAL_COMMUNITY): Payer: Self-pay

## 2024-06-18 ENCOUNTER — Encounter: Payer: Self-pay | Admitting: Nurse Practitioner

## 2024-06-19 ENCOUNTER — Other Ambulatory Visit: Payer: Self-pay

## 2024-06-19 MED ORDER — TAMSULOSIN HCL 0.4 MG PO CAPS
0.4000 mg | ORAL_CAPSULE | Freq: Every day | ORAL | 0 refills | Status: AC
  Filled 2024-06-19: qty 90, 90d supply, fill #0

## 2024-06-20 ENCOUNTER — Encounter (HOSPITAL_BASED_OUTPATIENT_CLINIC_OR_DEPARTMENT_OTHER): Payer: Self-pay | Admitting: General Surgery

## 2024-06-21 ENCOUNTER — Ambulatory Visit: Payer: Self-pay | Admitting: Nurse Practitioner

## 2024-06-25 ENCOUNTER — Encounter (HOSPITAL_COMMUNITY): Payer: Self-pay

## 2024-06-25 ENCOUNTER — Ambulatory Visit (HOSPITAL_COMMUNITY)

## 2024-06-28 ENCOUNTER — Encounter (HOSPITAL_BASED_OUTPATIENT_CLINIC_OR_DEPARTMENT_OTHER): Admitting: General Surgery
